# Patient Record
Sex: Male | Born: 1960 | Race: Black or African American | Hispanic: No | Marital: Married | Smoking: Former smoker
Health system: Southern US, Community
[De-identification: ages and names within clinical notes are randomized; demographics above are authoritative.]

## PROBLEM LIST (undated history)

## (undated) DIAGNOSIS — Z9289 Personal history of other medical treatment: Secondary | ICD-10-CM

## (undated) DIAGNOSIS — C801 Malignant (primary) neoplasm, unspecified: Secondary | ICD-10-CM

## (undated) DIAGNOSIS — D573 Sickle-cell trait: Secondary | ICD-10-CM

## (undated) DIAGNOSIS — IMO0001 Reserved for inherently not codable concepts without codable children: Secondary | ICD-10-CM

## (undated) DIAGNOSIS — R931 Abnormal findings on diagnostic imaging of heart and coronary circulation: Secondary | ICD-10-CM

## (undated) DIAGNOSIS — Z8679 Personal history of other diseases of the circulatory system: Secondary | ICD-10-CM

## (undated) DIAGNOSIS — N319 Neuromuscular dysfunction of bladder, unspecified: Secondary | ICD-10-CM

## (undated) DIAGNOSIS — I10 Essential (primary) hypertension: Secondary | ICD-10-CM

## (undated) DIAGNOSIS — Z9889 Other specified postprocedural states: Secondary | ICD-10-CM

## (undated) DIAGNOSIS — M869 Osteomyelitis, unspecified: Secondary | ICD-10-CM

## (undated) DIAGNOSIS — R935 Abnormal findings on diagnostic imaging of other abdominal regions, including retroperitoneum: Secondary | ICD-10-CM

## (undated) DIAGNOSIS — D509 Iron deficiency anemia, unspecified: Secondary | ICD-10-CM

## (undated) HISTORY — DX: Reserved for inherently not codable concepts without codable children: IMO0001

## (undated) HISTORY — PX: TONSILLECTOMY: SUR1361

## (undated) HISTORY — PX: PATELLA FRACTURE SURGERY: SHX735

## (undated) HISTORY — DX: Personal history of other diseases of the circulatory system: Z86.79

## (undated) HISTORY — DX: Other specified postprocedural states: Z98.890

## (undated) HISTORY — DX: Abnormal findings on diagnostic imaging of heart and coronary circulation: R93.1

## (undated) HISTORY — DX: Abnormal findings on diagnostic imaging of other abdominal regions, including retroperitoneum: R93.5

## (undated) HISTORY — DX: Personal history of other medical treatment: Z92.89

## (undated) HISTORY — DX: Sickle-cell trait: D57.3

---

## 1998-02-18 ENCOUNTER — Emergency Department (HOSPITAL_COMMUNITY): Admission: EM | Admit: 1998-02-18 | Discharge: 1998-02-18 | Payer: Self-pay | Admitting: Emergency Medicine

## 1998-02-20 ENCOUNTER — Emergency Department (HOSPITAL_COMMUNITY): Admission: EM | Admit: 1998-02-20 | Discharge: 1998-02-20 | Payer: Self-pay | Admitting: Emergency Medicine

## 1999-01-12 ENCOUNTER — Emergency Department (HOSPITAL_COMMUNITY): Admission: EM | Admit: 1999-01-12 | Discharge: 1999-01-12 | Payer: Self-pay

## 1999-03-16 ENCOUNTER — Emergency Department (HOSPITAL_COMMUNITY): Admission: EM | Admit: 1999-03-16 | Discharge: 1999-03-16 | Payer: Self-pay | Admitting: Emergency Medicine

## 2001-10-09 ENCOUNTER — Encounter: Payer: Self-pay | Admitting: Emergency Medicine

## 2001-10-09 ENCOUNTER — Inpatient Hospital Stay (HOSPITAL_COMMUNITY): Admission: EM | Admit: 2001-10-09 | Discharge: 2001-10-13 | Payer: Self-pay | Admitting: Emergency Medicine

## 2001-10-11 ENCOUNTER — Encounter: Payer: Self-pay | Admitting: Internal Medicine

## 2002-06-24 ENCOUNTER — Encounter: Payer: Self-pay | Admitting: Emergency Medicine

## 2002-06-24 ENCOUNTER — Emergency Department (HOSPITAL_COMMUNITY): Admission: EM | Admit: 2002-06-24 | Discharge: 2002-06-24 | Payer: Self-pay | Admitting: Emergency Medicine

## 2002-06-28 ENCOUNTER — Encounter: Payer: Self-pay | Admitting: Specialist

## 2002-06-28 ENCOUNTER — Encounter: Admission: RE | Admit: 2002-06-28 | Discharge: 2002-06-28 | Payer: Self-pay | Admitting: *Deleted

## 2002-06-30 ENCOUNTER — Encounter: Payer: Self-pay | Admitting: Specialist

## 2002-07-01 ENCOUNTER — Inpatient Hospital Stay (HOSPITAL_COMMUNITY): Admission: RE | Admit: 2002-07-01 | Discharge: 2002-07-04 | Payer: Self-pay | Admitting: Specialist

## 2002-07-01 ENCOUNTER — Encounter: Payer: Self-pay | Admitting: Specialist

## 2003-12-06 ENCOUNTER — Emergency Department (HOSPITAL_COMMUNITY): Admission: EM | Admit: 2003-12-06 | Discharge: 2003-12-06 | Payer: Self-pay | Admitting: Emergency Medicine

## 2004-04-03 ENCOUNTER — Emergency Department (HOSPITAL_COMMUNITY): Admission: EM | Admit: 2004-04-03 | Discharge: 2004-04-03 | Payer: Self-pay | Admitting: Emergency Medicine

## 2005-07-04 ENCOUNTER — Encounter
Admission: RE | Admit: 2005-07-04 | Discharge: 2005-10-02 | Payer: Self-pay | Admitting: Physical Medicine & Rehabilitation

## 2005-07-04 ENCOUNTER — Ambulatory Visit: Payer: Self-pay | Admitting: Physical Medicine & Rehabilitation

## 2005-08-25 ENCOUNTER — Emergency Department (HOSPITAL_COMMUNITY): Admission: EM | Admit: 2005-08-25 | Discharge: 2005-08-25 | Payer: Self-pay | Admitting: Emergency Medicine

## 2006-02-02 ENCOUNTER — Emergency Department (HOSPITAL_COMMUNITY): Admission: EM | Admit: 2006-02-02 | Discharge: 2006-02-02 | Payer: Self-pay | Admitting: Emergency Medicine

## 2006-04-05 ENCOUNTER — Emergency Department (HOSPITAL_COMMUNITY): Admission: EM | Admit: 2006-04-05 | Discharge: 2006-04-05 | Payer: Self-pay | Admitting: Emergency Medicine

## 2007-01-30 ENCOUNTER — Emergency Department (HOSPITAL_COMMUNITY): Admission: EM | Admit: 2007-01-30 | Discharge: 2007-01-30 | Payer: Self-pay | Admitting: Emergency Medicine

## 2007-04-08 ENCOUNTER — Emergency Department (HOSPITAL_COMMUNITY): Admission: EM | Admit: 2007-04-08 | Discharge: 2007-04-09 | Payer: Self-pay | Admitting: Emergency Medicine

## 2007-10-06 ENCOUNTER — Ambulatory Visit: Payer: Self-pay | Admitting: Family Medicine

## 2007-10-06 LAB — CONVERTED CEMR LAB
BUN: 10 mg/dL (ref 6–23)
Calcium: 9.6 mg/dL (ref 8.4–10.5)
Glucose, Bld: 145 mg/dL — ABNORMAL HIGH (ref 70–99)
Microalb, Ur: 1.01 mg/dL (ref 0.00–1.89)
PSA: 0.22 ng/mL (ref 0.10–4.00)
Potassium: 4.4 meq/L (ref 3.5–5.3)
Triglycerides: 351 mg/dL — ABNORMAL HIGH (ref <150)
VLDL: 70 mg/dL — ABNORMAL HIGH (ref 0–40)

## 2007-10-16 ENCOUNTER — Ambulatory Visit: Payer: Self-pay | Admitting: Family Medicine

## 2007-10-23 ENCOUNTER — Ambulatory Visit: Payer: Self-pay | Admitting: Internal Medicine

## 2008-01-19 ENCOUNTER — Ambulatory Visit: Payer: Self-pay | Admitting: Family Medicine

## 2008-06-21 ENCOUNTER — Emergency Department (HOSPITAL_COMMUNITY): Admission: EM | Admit: 2008-06-21 | Discharge: 2008-06-21 | Payer: Self-pay | Admitting: Emergency Medicine

## 2008-06-24 ENCOUNTER — Ambulatory Visit: Payer: Self-pay | Admitting: Family Medicine

## 2008-06-24 ENCOUNTER — Ambulatory Visit (HOSPITAL_COMMUNITY): Admission: RE | Admit: 2008-06-24 | Discharge: 2008-06-24 | Payer: Self-pay | Admitting: Family Medicine

## 2008-06-24 DIAGNOSIS — I10 Essential (primary) hypertension: Secondary | ICD-10-CM

## 2008-06-24 DIAGNOSIS — R002 Palpitations: Secondary | ICD-10-CM

## 2008-06-24 DIAGNOSIS — E1165 Type 2 diabetes mellitus with hyperglycemia: Secondary | ICD-10-CM

## 2008-06-24 LAB — CONVERTED CEMR LAB: Hgb A1c MFr Bld: 9 %

## 2008-06-26 DIAGNOSIS — E119 Type 2 diabetes mellitus without complications: Secondary | ICD-10-CM | POA: Insufficient documentation

## 2008-06-28 ENCOUNTER — Encounter: Payer: Self-pay | Admitting: Family Medicine

## 2008-07-22 ENCOUNTER — Ambulatory Visit: Payer: Self-pay | Admitting: Family Medicine

## 2008-07-22 ENCOUNTER — Encounter: Payer: Self-pay | Admitting: Family Medicine

## 2008-07-22 LAB — CONVERTED CEMR LAB
Alkaline Phosphatase: 59 units/L (ref 39–117)
BUN: 7 mg/dL (ref 6–23)
CO2: 19 meq/L (ref 19–32)
Calcium: 9.2 mg/dL (ref 8.4–10.5)
Creatinine, Ser: 0.81 mg/dL (ref 0.40–1.50)
HDL: 26 mg/dL — ABNORMAL LOW (ref >39)
LDL Cholesterol: 31 mg/dL (ref 0–99)
Total Protein: 7.1 g/dL (ref 6.0–8.3)
VLDL: 42 mg/dL — ABNORMAL HIGH (ref 0–40)

## 2008-07-26 ENCOUNTER — Encounter: Payer: Self-pay | Admitting: Family Medicine

## 2008-09-23 ENCOUNTER — Ambulatory Visit: Payer: Self-pay | Admitting: Family Medicine

## 2008-09-23 DIAGNOSIS — R609 Edema, unspecified: Secondary | ICD-10-CM | POA: Insufficient documentation

## 2008-09-23 DIAGNOSIS — M79609 Pain in unspecified limb: Secondary | ICD-10-CM | POA: Insufficient documentation

## 2008-09-23 LAB — CONVERTED CEMR LAB: Hgb A1c MFr Bld: 7.8 %

## 2008-09-30 ENCOUNTER — Encounter: Payer: Self-pay | Admitting: *Deleted

## 2008-09-30 ENCOUNTER — Encounter: Payer: Self-pay | Admitting: Family Medicine

## 2008-09-30 ENCOUNTER — Ambulatory Visit (HOSPITAL_COMMUNITY): Admission: RE | Admit: 2008-09-30 | Discharge: 2008-09-30 | Payer: Self-pay | Admitting: Family Medicine

## 2008-09-30 ENCOUNTER — Ambulatory Visit: Payer: Self-pay | Admitting: Vascular Surgery

## 2008-12-09 ENCOUNTER — Ambulatory Visit: Payer: Self-pay | Admitting: Family Medicine

## 2008-12-09 ENCOUNTER — Encounter: Payer: Self-pay | Admitting: Family Medicine

## 2008-12-09 DIAGNOSIS — Z87440 Personal history of urinary (tract) infections: Secondary | ICD-10-CM

## 2008-12-09 DIAGNOSIS — H5710 Ocular pain, unspecified eye: Secondary | ICD-10-CM

## 2008-12-09 LAB — CONVERTED CEMR LAB
Blood in Urine, dipstick: NEGATIVE
Glucose, Urine, Semiquant: 250
Ketones, urine, test strip: NEGATIVE
Platelets: 254 10*3/uL (ref 150–400)
Protein, U semiquant: 30
RBC: 4.93 M/uL (ref 4.22–5.81)
Specific Gravity, Urine: 1.025
Urobilinogen, UA: 1
pH: 7

## 2008-12-12 ENCOUNTER — Encounter: Payer: Self-pay | Admitting: Family Medicine

## 2009-05-01 ENCOUNTER — Encounter (INDEPENDENT_AMBULATORY_CARE_PROVIDER_SITE_OTHER): Payer: Self-pay | Admitting: *Deleted

## 2009-05-01 DIAGNOSIS — F17200 Nicotine dependence, unspecified, uncomplicated: Secondary | ICD-10-CM

## 2009-06-27 ENCOUNTER — Telehealth: Payer: Self-pay | Admitting: Family Medicine

## 2009-10-25 ENCOUNTER — Telehealth: Payer: Self-pay | Admitting: Family Medicine

## 2009-11-21 ENCOUNTER — Ambulatory Visit: Payer: Self-pay | Admitting: Family Medicine

## 2009-11-21 LAB — CONVERTED CEMR LAB: Hgb A1c MFr Bld: 8.3 %

## 2009-11-24 ENCOUNTER — Ambulatory Visit: Payer: Self-pay | Admitting: Family Medicine

## 2009-11-24 ENCOUNTER — Encounter: Payer: Self-pay | Admitting: Family Medicine

## 2009-11-24 LAB — CONVERTED CEMR LAB
HDL: 29 mg/dL — ABNORMAL LOW (ref >39)
Total CHOL/HDL Ratio: 4.2

## 2009-11-27 ENCOUNTER — Encounter: Payer: Self-pay | Admitting: Family Medicine

## 2009-12-29 ENCOUNTER — Telehealth: Payer: Self-pay | Admitting: Family Medicine

## 2010-03-30 ENCOUNTER — Telehealth (INDEPENDENT_AMBULATORY_CARE_PROVIDER_SITE_OTHER): Payer: Self-pay | Admitting: *Deleted

## 2010-03-30 ENCOUNTER — Emergency Department (HOSPITAL_COMMUNITY)
Admission: EM | Admit: 2010-03-30 | Discharge: 2010-03-30 | Payer: Self-pay | Source: Home / Self Care | Admitting: Family Medicine

## 2010-04-16 ENCOUNTER — Ambulatory Visit: Payer: Self-pay | Admitting: Family Medicine

## 2010-04-16 ENCOUNTER — Encounter: Payer: Self-pay | Admitting: Family Medicine

## 2010-04-16 DIAGNOSIS — M545 Low back pain, unspecified: Secondary | ICD-10-CM | POA: Insufficient documentation

## 2010-04-16 LAB — CONVERTED CEMR LAB
ALT: 16 units/L (ref 0–53)
AST: 17 units/L (ref 0–37)
BUN: 8 mg/dL (ref 6–23)
Bilirubin Urine: NEGATIVE
CO2: 24 meq/L (ref 19–32)
Chloride: 107 meq/L (ref 96–112)
Cholesterol: 102 mg/dL (ref 0–200)
Creatinine, Ser: 0.84 mg/dL (ref 0.40–1.50)
Glucose, Bld: 76 mg/dL (ref 70–99)
HDL: 32 mg/dL — ABNORMAL LOW (ref >39)
Platelets: 285 10*3/uL (ref 150–400)
Protein, U semiquant: 100
RDW: 12.7 % (ref 11.5–15.5)
Sodium: 137 meq/L (ref 135–145)
Specific Gravity, Urine: 1.02
VLDL: 21 mg/dL (ref 0–40)
WBC Urine, dipstick: NEGATIVE
pH: 7.5

## 2010-04-17 ENCOUNTER — Telehealth: Payer: Self-pay | Admitting: *Deleted

## 2010-05-22 ENCOUNTER — Telehealth: Payer: Self-pay | Admitting: Family Medicine

## 2010-05-25 ENCOUNTER — Ambulatory Visit: Admission: RE | Admit: 2010-05-25 | Discharge: 2010-05-25 | Payer: Self-pay | Source: Home / Self Care

## 2010-05-25 DIAGNOSIS — N401 Enlarged prostate with lower urinary tract symptoms: Secondary | ICD-10-CM | POA: Insufficient documentation

## 2010-05-25 DIAGNOSIS — K089 Disorder of teeth and supporting structures, unspecified: Secondary | ICD-10-CM | POA: Insufficient documentation

## 2010-05-30 ENCOUNTER — Encounter: Payer: Self-pay | Admitting: *Deleted

## 2010-05-31 NOTE — Progress Notes (Signed)
----   Converted from flag ---- ---- 04/17/2010 3:00 PM, Tessie Fass CMA wrote:   ---- 04/16/2010 11:57 AM, Luretha Murphy NP wrote: URO referral needed at [facility] for hematuria recurrent, urinary retention, UTI in a male, smoker and diabetic. ------------------------------  called [facility] to return call....Marland KitchenMarland KitchenTessie Fass CMA  April 17, 2010 3:02 PM spoke with [facility]....faxed referral for appt.Tessie Fass CMA  April 18, 2010 10:48 AM

## 2010-05-31 NOTE — Progress Notes (Signed)
Summary: refill  Phone Note Refill Request Call back at [phone] Message from:  Patient  Refills Requested: Medication #1:  GLIPIZIDE 10 MG TABS bid [facility]  Initial call taken by: De Nurse,  June 27, 2009 4:21 PM    New/Updated Medications: GLIPIZIDE 10 MG TABS (GLIPIZIDE) bid Prescriptions: GLIPIZIDE 10 MG TABS (GLIPIZIDE) bid  #60 x 6   Entered and Authorized by:   Luretha Murphy NP   Signed by:   Luretha Murphy NP on 06/27/2009   Method used:   Electronically to        [facility] (retail)       [address]       [place], [place]  [address]       Ph: [phone]       Fax: [phone]   RxID:   [id number]

## 2010-05-31 NOTE — Progress Notes (Signed)
Summary: Rx  Phone Note Refill Request Call back at Home Phone [phone]   Refills Requested: Medication #1:  VICODIN 5-500 MG TABS 2 tabs q 4 hours as needed Initial call taken by: Knox Royalty,  May 22, 2010 12:19 PM  Follow-up for Phone Call        This was printed and faxed this AM Follow-up by: Luretha Murphy NP,  May 22, 2010 1:50 PM

## 2010-05-31 NOTE — Progress Notes (Signed)
Summary: Rx Req  Phone Note Refill Request Call back at [phone] Message from:  Patient  Refills Requested: Medication #1:  CARDIZEM 60 MG TABS two times a day  Medication #2:  METFORMIN HCL 1000 MG TABS two times a day [facility].  Initial call taken by: Clydell Hakim,  October 25, 2009 3:34 PM  Follow-up for Phone Call        As stated in April, no further refills until he is seen.  Follow-up by: Zachery Dauer MD,  October 25, 2009 4:14 PM    Prescriptions: CARDIZEM 60 MG TABS (DILTIAZEM HCL) two times a day  #60 x 0   Entered and Authorized by:   Zachery Dauer MD   Signed by:   Zachery Dauer MD on 10/25/2009   Method used:   Electronically to        [facility] (retail)       [address]       [place], [place]  [address]       Ph: [phone]       Fax: [phone]   RxID:   [id number] GLIPIZIDE 10 MG TABS (GLIPIZIDE) bid  #60 x 1   Entered and Authorized by:   Zachery Dauer MD   Signed by:   Zachery Dauer MD on 10/25/2009   Method used:   Electronically to        [facility] (retail)       [address]       [place], [place]  [address]       Ph: [phone]       Fax: [phone]   RxID:   [id number]   Appended Document: Rx Req Spoke to wife and given info.  Starleen Blue RN 10/25/2009

## 2010-05-31 NOTE — Progress Notes (Signed)
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       [facility]       [address]       [place], [place]  [address]       Ph: [phone]       Fax: [phone]   RxID:   [id number]

## 2010-05-31 NOTE — Assessment & Plan Note (Signed)
Summary: back pain/eo   Vital Signs:  Patient profile:   50 year old male Height:      79 inches Weight:      353 pounds BMI:     39.91 Temp:     98.4 degrees F oral Pulse rate:   82 / minute BP sitting:   144 / 88  (left arm) Cuff size:   large  Vitals Entered By: Tessie Fass CMA (November 21, 2009 8:42 AM) CC: lower back pain x 2 weeks Is Patient Diabetic? Yes Pain Assessment Patient in pain? yes     Location: lower back Intensity: 10   CC:  lower back pain x 2 weeks.  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  [place]  If he does not work he does not get paid.  He thinks it started after lifting some items off the truck.  Pain is severe in lower back, having spasms, pain radiates down both legs.  Hurts to lay, sit, and walk.  Past history of back injury.  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  Has been out of his glipizide and lisinopril.  Has not been in for prevention in over 6 months.  Habits & Providers  Alcohol-Tobacco-Diet     Tobacco Status: current     Tobacco Counseling: to quit use of tobacco products     Cigarette Packs/Day: 1.0  Current Medications (verified): 1)  Glipizide 10 Mg Tabs (Glipizide) .... Bid 2)  Metformin Hcl 1000 Mg Tabs (Metformin Hcl) .... Two Times A Day 3)  Cardizem 60 Mg Tabs (Diltiazem Hcl) .... Two Times A Day 4)  Lisinopril 10 Mg Tabs (Lisinopril) .... One Daily 5)  Adult Aspirin Low Strength 81 Mg Tbdp (Aspirin) 6)  Diclofenac Sodium 75 Mg Tbec (Diclofenac Sodium) .... Two Times A Day For Knee Pain and Shoulder Pain 7)  Percocet 5-325 Mg Tabs (Oxycodone-Acetaminophen) .Marland Kitchen.. 1-2 Tabs Q 4 Hours As Needed 8)  Cyclobenzaprine Hcl 10 Mg Tabs (Cyclobenzaprine Hcl) .... One Three Times A Day As Needed For Muscle Spasms  Allergies (verified): No Known Drug Allergies  Review  of Systems General:  Denies fever, loss of appetite, and weakness. MS:  Complains of low back pain and stiffness.  Physical Exam  General:  Very large man in quite a bit of pain, guarding all movement Lungs:  normal respiratory effort and normal breath sounds.   Heart:  normal rate and regular rhythm.   Msk:  Unable to put lower back into motion, very still with flat lordosis.  + straight leg raise bilaterally Extremities:  3+ edema to knees   Impression & Recommendations:  Problem # 1:  LOW BACK PAIN, ACUTE (ICD-724.2)  Treat acute pain, begin stretching when pain is controlled, ice.  No working until recheck this Friday. His updated medication list for this problem includes:    Adult Aspirin Low Strength 81 Mg Tbdp (Aspirin)    Diclofenac Sodium 75 Mg Tbec (Diclofenac sodium) .Marland Kitchen..Marland Kitchen Two times a day for knee pain and shoulder pain    Percocet 5-325 Mg Tabs (Oxycodone-acetaminophen) .Marland Kitchen... 1-2 tabs q 4 hours as needed    Cyclobenzaprine Hcl 10 Mg Tabs (Cyclobenzaprine hcl) ..... One three times a day as needed for muscle spasms  Orders: [facility]- Est  Level 4 ([address])  Problem # 2:  DIABETES MELLITUS, TYPE II (ICD-250.00) Refilled meds A1C at 8.3%, deteriorated His updated medication list for this problem includes:    Glipizide 10 Mg Tabs (Glipizide) ..... Bid    Metformin Hcl 1000 Mg Tabs (Metformin hcl) .Marland Kitchen..Marland Kitchen Two times a day    Lisinopril 10 Mg Tabs (Lisinopril) ..... One daily    Adult Aspirin Low Strength 81 Mg Tbdp (Aspirin)  Orders: A1C-[facility] (308[address]) [facility]- Est  Level 4 (784[address])  Problem # 3:  HYPERTENSION (ICD-401.9)  not at goal today but has been out of [facility] His updated medication list for this problem includes:    Cardizem 60 Mg Tabs (Diltiazem hcl) .Marland Kitchen..Marland Kitchen Two times a day    Lisinopril 10 Mg Tabs (Lisinopril) ..... One daily  Orders: [facility]- Est  Level 4 ([address])  Complete Medication List: 1)  Glipizide 10 Mg Tabs (Glipizide) .... Bid 2)  Metformin Hcl 1000 Mg Tabs  (Metformin hcl) .... Two times a day 3)  Cardizem 60 Mg Tabs (Diltiazem hcl) .... Two times a day 4)  Lisinopril 10 Mg Tabs (Lisinopril) .... One daily 5)  Adult Aspirin Low Strength 81 Mg Tbdp (Aspirin) 6)  Diclofenac Sodium 75 Mg Tbec (Diclofenac sodium) .... Two times a day for knee pain and shoulder pain 7)  Percocet 5-325 Mg Tabs (Oxycodone-acetaminophen) .Marland Kitchen.. 1-2 tabs q 4 hours as needed 8)  Cyclobenzaprine Hcl 10 Mg Tabs (Cyclobenzaprine hcl) .... One three times a day as needed for muscle spasms  Patient Instructions: 1)  return Friday to see Janila Arrazola Prescriptions: LISINOPRIL 10 MG TABS (LISINOPRIL) one daily  #30 x 6   Entered and Authorized by:   Luretha Murphy NP   Signed by:   Luretha Murphy NP on 11/21/2009   Method used:   Print then Give to Patient   RxID:   [id number] CARDIZEM 60 MG TABS (DILTIAZEM HCL) two times a day  #60 x 6   Entered and Authorized by:   Luretha Murphy NP   Signed by:   Luretha Murphy NP on 11/21/2009   Method used:   Print then Give to Patient   RxID:   [id number] LISINOPRIL 10 MG TABS (LISINOPRIL) one daily  #30 x 6   Entered and Authorized by:   Luretha Murphy NP   Signed by:   Luretha Murphy NP on 11/21/2009   Method used:   Print then Give to Patient   RxID:   [id number] CARDIZEM 60 MG TABS (DILTIAZEM HCL) two times a day  #60 x 6   Entered and Authorized by:   Luretha Murphy NP   Signed by:   Luretha Murphy NP on 11/21/2009   Method used:   Print then Give to Patient   RxID:   [id number] GLIPIZIDE 10 MG TABS (GLIPIZIDE) bid  #60 x 6   Entered and Authorized by:   Luretha Murphy NP   Signed by:   Luretha Murphy NP on 11/21/2009   Method used:   Print then Give to Patient   RxID:   [id number] GLIPIZIDE 10 MG TABS (GLIPIZIDE) bid  #60 x 6   Entered and Authorized by:   Luretha Murphy NP   Signed by:   Luretha Murphy NP on 11/21/2009   Method used:   Print then Give to Patient   RxID:   [id number] CYCLOBENZAPRINE HCL 10 MG TABS  (CYCLOBENZAPRINE HCL) one three times a day as needed for muscle spasms Brand medically necessary #90 x 3   Entered and Authorized by:   Luretha Murphy  NP   Signed by:   Luretha Murphy NP on 11/21/2009   Method used:   Print then Give to Patient   RxID:   [id number] DICLOFENAC SODIUM 75 MG TBEC (DICLOFENAC SODIUM) two times a day for knee pain and shoulder pain Brand medically necessary #60 x 6   Entered and Authorized by:   Luretha Murphy NP   Signed by:   Luretha Murphy NP on 11/21/2009   Method used:   Print then Give to Patient   RxID:   [id number] PERCOCET 5-325 MG TABS (OXYCODONE-ACETAMINOPHEN) 1-2 tabs q 4 hours as needed Brand medically necessary #50 x 0   Entered and Authorized by:   Luretha Murphy NP   Signed by:   Luretha Murphy NP on 11/21/2009   Method used:   Print then Give to Patient   RxID:   [id number]   Laboratory Results   Blood Tests   Date/Time Received: November 21, 2009 8:50 AM  Date/Time Reported: November 21, 2009 9:04 AM   HGBA1C: 8.3%   (Normal Range: Non-Diabetic - 3-6%   Control Diabetic - 6-8%)  Comments: ...............test performed by......Marland KitchenBonnie A. [place], MLS (ASCP)cm        Prevention & Chronic Care Immunizations   Influenza vaccine: Not documented    Tetanus booster: 06/30/2006: given   Tetanus booster due: 06/29/2016    Pneumococcal vaccine: Not documented  Other Screening   PSA: 0.22  (10/06/2007)   PSA due due: 10/05/2008   Smoking status: current  (11/21/2009)   Smoking cessation counseling: yes  (12/09/2008)  Diabetes Mellitus   HgbA1C: 8.3  (11/21/2009)   Hemoglobin A1C due: 09/21/2008    Eye exam: Not documented   Diabetic eye exam action/deferral: Ophthalmology referral  (12/09/2008)    Foot exam: Not documented   Foot exam action/deferral: Do today   High risk foot: Not documented   Foot care education: Not documented    Urine microalbumin/creatinine ratio: Not documented   Urine microalbumin action/deferral:  Ordered   Urine microalbumin/cr due: 10/05/2008    Diabetes flowsheet reviewed?: Yes   Progress toward A1C goal: Deteriorated  Lipids   Total Cholesterol: 99  (07/22/2008)   LDL: 31  (07/22/2008)   LDL Direct: Not documented   HDL: 26  (07/22/2008)   Triglycerides: 208  (07/22/2008)  Hypertension   Last Blood Pressure: 144 / 88  (11/21/2009)   Serum creatinine: 0.81  (07/22/2008)   Serum potassium 4.2  (07/22/2008)  Self-Management Support :    Diabetes self-management support: Not documented    Hypertension self-management support: Not documented   Nursing Instructions: Diabetic foot exam today

## 2010-05-31 NOTE — Assessment & Plan Note (Signed)
Summary: F/U/KH   Vital Signs:  Patient profile:   50 year old male Weight:      357.4 pounds BP sitting:   144 / 98  (left arm)  Vitals Entered By: Starleen Blue RN (November 24, 2009 9:04 AM) CC: f/u back, Back Pain Is Patient Diabetic? Yes Pain Assessment Patient in pain? yes     Location: back Intensity: 8   CC:  f/u back and Back Pain.  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  He is not much better today.    Back Pain History:      The patient's back pain started approximately 11/20/2009.  The pain is located in the lower back region and does radiate below the knees.  He states this is work related.  On a scale of 1-10, he describes the pain as a 10.  He states that he has had a prior history of back pain.  The patient has not had any recent physical therapy for his back pain.  The following makes the back pain better: not much, lying on side .  The following makes the back pain worse: sitting, standing.        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     Habits & Providers  Alcohol-Tobacco-Diet     Tobacco Status: current     Cigarette Packs/Day: 1.0  Current Medications (verified): 1)  Glipizide 10 Mg Tabs (Glipizide) .... Bid 2)  Metformin Hcl 1000 Mg Tabs (Metformin Hcl) .... Two Times A Day 3)  Cardizem 60 Mg Tabs (Diltiazem Hcl) .... Two Times A Day 4)  Lisinopril 10 Mg Tabs (Lisinopril) .... One Daily 5)  Adult Aspirin Low Strength 81 Mg Tbdp (Aspirin) 6)  Diclofenac Sodium 75 Mg Tbec (Diclofenac Sodium) .... Two Times A Day For Knee Pain and Shoulder Pain 7)  Percocet 5-325 Mg Tabs (Oxycodone-Acetaminophen) .Marland Kitchen.. 1-2 Tabs Q 4 Hours As Needed 8)  Cyclobenzaprine Hcl 10 Mg Tabs (Cyclobenzaprine Hcl) .... One Three Times A  Day As Needed For Muscle Spasms  Allergies (verified): No Known Drug Allergies  Physical Exam  General:  Moving very slowly, appears to be in severe pain, see Back Exam  Low Back Pain Physical Exam:    Inspection-deformity:     Yes    Palpation-spinal tenderness:   Yes    Motor Exam/Strength:         Left Ankle Dorsiflexion (L5,L4):     normal       Left Great Toe Dorsiflexion (L5,L4):     normal       Left Heel Walk (L5,some L4):     normal       Left Toe Walk-calf (S1):       abnormal       Right Ankle Dorsiflexion (L5,L4):     normal       Right Great Toe Dorsiflexion (L5,L4):       normal       Right Heel Walk (L5,some L4):     normal       Right Toe Walk-calf (S1):       abnormal    Sensory Exam/Pinprick:        Left Medial Foot (L4):   decreased  Left Dorsal Foot (L5):   decreased       Left Lateral Foot (S1):   decreased       Right Medial Foot (L4):   decreased       Right Dorsal Foot (L5):   decreased       Right Lateral Foot (S1):   decreased       Sensory--Other:     sensory loss may be related to DM that was untreated for years    Reflexes:        Left Knee Jerk (L4):     absent       Left Ankle Reflex (S1):   absent       Right Knee Jerk:     absent       Right Ankle Reflex (S1):   absent    Straight Leg Raise (SLR):       Left Straight Leg Raise (SLR):   positive at 20 degrees       Right Straight Leg Raise (SLR):   positive at 10 degrees   Impression & Recommendations:  Problem # 1:  LOW BACK PAIN, ACUTE (ICD-724.2)  Continue plan, expect recovery at some point.  He is to watchful wait, not drive until he feels that he can tolerate the pain without medicaions.  He believes that this happened at work when lifting from his 77 wheeler during a unloading.  He will look at this workman's comp options.  He really cannot afford to not drive.  Did not refill meds, he still has plenty. His updated medication list for this problem includes:    Adult Aspirin Low  Strength 81 Mg Tbdp (Aspirin)    Diclofenac Sodium 75 Mg Tbec (Diclofenac sodium) .Marland Kitchen..Marland Kitchen Two times a day for knee pain and shoulder pain    Percocet 5-325 Mg Tabs (Oxycodone-acetaminophen) .Marland Kitchen... 1-2 tabs q 4 hours as needed    Cyclobenzaprine Hcl 10 Mg Tabs (Cyclobenzaprine hcl) ..... One three times a day as needed for muscle spasms  Orders: [facility]- Est Level  3 ([address])  Complete Medication List: 1)  Glipizide 10 Mg Tabs (Glipizide) .... Bid 2)  Metformin Hcl 1000 Mg Tabs (Metformin hcl) .... Two times a day 3)  Cardizem 60 Mg Tabs (Diltiazem hcl) .... Two times a day 4)  Lisinopril 20 Mg Tabs (Lisinopril) .... One daily (dosage change with next script, cancel 10 mg dosage) 5)  Adult Aspirin Low Strength 81 Mg Tbdp (Aspirin) 6)  Diclofenac Sodium 75 Mg Tbec (Diclofenac sodium) .... Two times a day for knee pain and shoulder pain 7)  Percocet 5-325 Mg Tabs (Oxycodone-acetaminophen) .Marland Kitchen.. 1-2 tabs q 4 hours as needed 8)  Cyclobenzaprine Hcl 10 Mg Tabs (Cyclobenzaprine hcl) .... One three times a day as needed for muscle spasms  Other Orders: Lipid-[facility] ([id number])  Patient Instructions: 1)  Continue plan of controlling pain, stretching and positioning as directed 2)  Investigate WC and I will complete forms if needed Prescriptions: LISINOPRIL 20 MG TABS (LISINOPRIL) one daily (dosage change with next script, cancel 10 mg dosage)  #30 x 6   Entered and Authorized by:   Luretha Murphy NP   Signed by:   Luretha Murphy NP on 11/24/2009   Method used:   Electronically to        [facility] (retail)       [address]       [place], [place]  [address]  Ph: [phone]       Fax: [phone]   RxID:   [id number]

## 2010-05-31 NOTE — Progress Notes (Signed)
Summary: sent to UC  Phone Note Call from Patient   Caller: Patient Summary of Call: pt having urinary problems and was sent to UC Initial call taken by: De Nurse,  March 30, 2010 11:24 AM

## 2010-05-31 NOTE — Letter (Signed)
Summary: Generic Letter  [facility]  [address]   [place], [place] [address]   Phone: [phone]  Fax: [phone]    11/27/2009  Ray Hall [address] [place], [place]  [address]  Dear [name],   When we checked you cholesterol your triglycerides were very high.  That tells me that you diet is not as healthy as it should be.  I would like you to purchase fish oil 1000 mg and begin to take it twice daily.  This will help you triglycerides, and be a positive factor in prevention of heart disease.  I hope your back is improving, weight loss will help over time. I hate to sound like a broken record but we do know that the basics work.      Sincerely,   Luretha Murphy NP  Appended Document: Generic Letter mailed

## 2010-05-31 NOTE — Miscellaneous (Signed)
Summary: [name]  Clinical Lists Changes  Problems: Added new problem of [name] (ICD-305.1) 

## 2010-05-31 NOTE — Assessment & Plan Note (Signed)
Summary: F/U  KH   Vital Signs:  Patient profile:   50 year old male Height:      79 inches Weight:      336 pounds BMI:     37.99 Temp:     98.5 degrees F Pulse rate:   34 / minute BP sitting:   108 / 80  (left arm)  Vitals Entered By: Theresia Lo RN (May 25, 2010 1:24 PM) CC: follow up regarding prostate problem , tooth ache Is Patient Diabetic? Yes Pain Assessment Patient in pain? yes     Location: tooth ache Intensity: 9 Type: ache   CC:  follow up regarding prostate problem  and tooth ache.  History of Present Illness: Painful right upper molar, putting BC powder on it.  He almost cries in pain.  He has advanced gum disease, he has many missing teeth.  He often pulls out his own teeth as he cannot afford to have them pulled.  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  He has had significant back injury in the past with multiple degenerative discs.  He played football for 2 years at [employer] and was hurt with a knee injury.  He is very worried about his prostate, he has had several infections, and now is taking more BR breaks. He leaks a little urine on his underwear during the night.  When he voids he voids large amounts.  Dizziness when he stands for about a month.  Habits & Providers  Alcohol-Tobacco-Diet     Tobacco Status: current     Tobacco Counseling: to quit use of tobacco products     Cigarette Packs/Day: 1.0  Current Medications (verified): 1)  Glipizide 10 Mg Tabs (Glipizide) .... Bid 2)  Metformin Hcl 1000 Mg Tabs (Metformin Hcl) .... Two Times A Day 3)  Lisinopril 20 Mg Tabs (Lisinopril) .... One Daily (Dosage Change With Next Script, Cancel 10 Mg Dosage) 4)  Adult Aspirin Low Strength 81 Mg Tbdp (Aspirin) 5)  Diclofenac Sodium 75 Mg Tbec (Diclofenac Sodium) .... Two Times A Day For Knee Pain and Shoulder Pain 6)  Cyclobenzaprine Hcl 10 Mg  Tabs (Cyclobenzaprine Hcl) .... One Three Times A Day As Needed For Muscle Spasms 7)  Vicodin 5-500 Mg Tabs (Hydrocodone-Acetaminophen) .... 2 Tabs Q 4 Hours As Needed 8)  Tamsulosin Hcl 0.4 Mg Caps (Tamsulosin Hcl) .... One Daily  Allergies: No Known Drug Allergies  Social History: Packs/Day:  1.0  Review of Systems General:  Denies chills, fatigue, and fever. GU:  Complains of incontinence and nocturia; denies dysuria, erectile dysfunction, hematuria, and urinary hesitancy. MS:  Complains of low back pain.  Physical Exam  General:  alert, very large man.   Mouth:  right upper molar with significant decay and severe pain with tapping, advanced gingivitis Lungs:  normal respiratory effort and normal breath sounds.   Heart:  normal rate and regular rhythm.   Psych:  normally interactive and good eye contact.     Impression & Recommendations:  Problem # 1:  BENIGN PROSTATIC HYPERTROPHY, WITH OBSTRUCTION (ICD-600.01) still having obstructive symptoms, however improved on alpha blocker, BP also down on this.  He has an apt with URO in April. 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 One three times a day as needed for muscle spasms    Vicodin 5-500 Mg Tabs (Hydrocodone-acetaminophen) .Marland Kitchen... 2 tabs q 4 hours as needed  Orders: [facility]- Est Level  3 (16109)  Problem # 3:  DENTAL PAIN (ICD-525.9) advanced gum disease and multiple caries, really in need to dental care, he will iikely pull his own tooth  as he cannot afford care, may use hydrocodone as needed.  Problem # 4:  HYPERTENSION (ICD-401.9) BP low and having dizziness when he stands since on tamsulosin, stop Dilt. The following medications were removed from the medication list:    Cardizem 60 Mg Tabs (Diltiazem hcl) .Marland Kitchen..Marland Kitchen Two times a day His updated medication list for this problem includes:    Lisinopril 20 Mg Tabs (Lisinopril) ..... One daily (dosage change with next script, cancel 10 mg dosage)  Complete Medication List: 1)  Glipizide 10 Mg Tabs (Glipizide) .... Bid 2)  Metformin Hcl 1000 Mg Tabs (Metformin hcl) .... Two times a day 3)  Lisinopril 20 Mg Tabs (Lisinopril) .... One daily (dosage change with next script, cancel 10 mg dosage) 4)  Adult Aspirin Low Strength 81 Mg Tbdp (Aspirin) 5)  Diclofenac Sodium 75 Mg Tbec (Diclofenac sodium) .... Two times a day for knee pain and shoulder pain 6)  Cyclobenzaprine Hcl 10 Mg Tabs (Cyclobenzaprine hcl) .... One three times a day as needed for muscle spasms 7)  Vicodin 5-500 Mg Tabs (Hydrocodone-acetaminophen) .... 2 tabs q 4 hours as needed 8)  Tamsulosin Hcl 0.4 Mg Caps (Tamsulosin hcl) .... One daily  Patient Instructions: 1)  Stop Diltiazem  2)  Take tamsulosin at night time 3)  Keep Urology apt in April 4)  Use the hydrocodone  only when you need it for tooth pain and back pain 5)  Dentist to pull the tooth 6)  Please schedule a follow-up appointment in 3 months .  Prescriptions: VICODIN 5-500 MG TABS (HYDROCODONE-ACETAMINOPHEN) 2 tabs q 4 hours as needed Brand medically necessary #100 x 0   Entered and Authorized by:   Luretha Murphy NP   Signed by:   Luretha Murphy NP on 05/25/2010   Method used:   Print then Give to Patient   RxID:   [id number]    Orders Added: 1)  [facility]- Est Level  3 [95621]     Prevention & Chronic Care Immunizations   Influenza vaccine: Not documented    Tetanus booster: 06/30/2006: given   Tetanus booster due: 06/29/2016     Pneumococcal vaccine: Not documented  Other Screening   PSA: 0.22  (10/06/2007)   PSA due due: 10/05/2008   Smoking status: current  (05/25/2010)   Smoking cessation counseling: yes  (12/09/2008)  Diabetes Mellitus   HgbA1C: 7.6  (04/16/2010)   Hemoglobin A1C due: 09/21/2008    Eye exam: Not documented   Diabetic eye exam action/deferral: Ophthalmology referral  (12/09/2008)    Foot exam: Not documented   Foot exam action/deferral: Do today   High risk foot: Not documented   Foot care education: Not documented  Urine microalbumin/creatinine ratio: Not documented   Urine microalbumin action/deferral: Ordered   Urine microalbumin/cr due: 10/05/2008  Lipids   Total Cholesterol: 102  (04/16/2010)   LDL: 49  (04/16/2010)   LDL Direct: Not documented   HDL: 32  (04/16/2010)   Triglycerides: 105  (04/16/2010)  Hypertension   Last Blood Pressure: 108 / 80  (05/25/2010)   Serum creatinine: 0.84  (04/16/2010)   Serum potassium 3.9  (04/16/2010)  Self-Management Support :    Diabetes self-management support: Not documented    Hypertension self-management support: Not documented

## 2010-05-31 NOTE — Assessment & Plan Note (Signed)
Summary: swelling to prostate/back pain/weakness to legs/bmc   Vital Signs:  Patient profile:   50 year old male Weight:      336.1 pounds Temp:     98.5 degrees F oral Pulse rate:   81 / minute BP sitting:   139 / 90  (right arm) Cuff size:   large  Vitals Entered By: Renato Battles slade,cma CC: back pain started Friday. blacked out on Thursday on toilet. weakness of legs x 2 days. Is Patient Diabetic? Yes Pain Assessment Patient in pain? yes     Location: back Intensity: 8 Onset of pain  x Friday   CC:  back pain started Friday. blacked out on Thursday on toilet. weakness of legs x 2 days.Marland Kitchen  History of Present Illness: Seen in [facility] 2 weeks ago for UTI/hematuria and urinary retention.  Started on Flomax and treated for 7 days wtih Bactrim DS.  His urine if flowing better.  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  He also has ED.    Back pain is severe, almost feels like he is paralyzed in his legs when he sits for long periods in the truck.  He will have to physically move his legs with his arms.  He has had multiple injuries in the past in football and otherwise.   His back flairs are become more frequent.  He describes tingling into his thighs, pain radiating into his legs, right >left.  He has no health insurance, he works full time as a Freight forwarder.  Says that he blacked out while having a BM when on the road last week.  He came to right away and got back into the truck and drove to [place].  He denies chest pain or any other problems like this before.  Habits & Providers  Alcohol-Tobacco-Diet     Tobacco Status: current     Tobacco Counseling: to quit use of tobacco products     Cigarette Packs/Day: 0.5  Allergies: No Known Drug Allergies  Social History: Packs/Day:  0.5  Review of Systems General:  Denies chills and fever. CV:  Denies chest pain or discomfort and fainting. GU:  Complains of erectile dysfunction, hematuria,  and urinary hesitancy. MS:  Complains of low back pain.  Physical Exam  General:  Looked very derpessed, did not make good eye contact Lungs:  normal respiratory effort and normal breath sounds.   Heart:  normal rate, regular rhythm, and no murmur.   Msk:  marked slowness of movement of spine, lack of lordosis, poor extension, flexion stopped by pain.  + right straight leg raises, weaness on the right as compaired to the left.   Impression & Recommendations:  Problem # 1:  LOW BACK PAIN SYNDROME, SEVERE (ICD-724.2)  Needs to have a MRI, he is to get certified by [facility] system so that we can order this, would be nice to have prior to URO specialist apt. His updated medication list for this problem includes:    Adult Aspirin Low Strength 81 Mg Tbdp (Aspirin)    Diclofenac Sodium 75 Mg Tbec (Diclofenac sodium) .Marland Kitchen..Marland Kitchen Two times a day for knee pain and shoulder pain    Cyclobenzaprine Hcl 10 Mg Tabs (Cyclobenzaprine hcl) ..... One three times a day as needed for muscle spasms    Vicodin 5-500 Mg Tabs (Hydrocodone-acetaminophen) .Marland Kitchen... 2 tabs q 4 hours as needed  Orders: [facility]- Est  Level 4 (04540)  Problem # 2:  UTI (ICD-599.0)  UA WNL today, + hematuria a few weeks ago, patient is a long term smoker, expresses symptoms consistent with urinary retention.  Will refer to URO for cysto and eval. ? is this coming from his lumbar spine, he is to get certified by our hospital so we can image his lower back.  Remain on alpha blocker Orders: Urinalysis-[facility] (00000) Urology Referral (Urology) CBC-[facility] [id number]) [facility]- Est  Level 4 (60454)  Problem # 3:  DIABETES MELLITUS, TYPE II (ICD-250.00) Discussed importance of him getting in every 3 months for check up. His updated medication list for this problem includes:    Glipizide 10 Mg Tabs (Glipizide) ..... Bid    Metformin Hcl 1000 Mg Tabs (Metformin hcl) .Marland Kitchen..Marland Kitchen Two times a day    Lisinopril 20 Mg Tabs (Lisinopril) ..... One daily (dosage change with  next script, cancel 10 mg dosage)    Adult Aspirin Low Strength 81 Mg Tbdp (Aspirin)  Orders: A1C-[facility] (09811) Comp Met-[facility] (91478-29562) Lipid-[facility] (13086-57846) CBC-[facility] (96295) [facility]- Est  Level 4 (28413)  Problem # 4:  TOBACCO USER (ICD-305.1) Counseled to quit  Problem # 5:  SYNCOPE, VASOVAGAL (ICD-780.2)  one episode during deffication, not sure what to make of this.  he is in poor health, he needs a number of referrals and work-up but he has no health insurance, explained that he must come in regularly so that we can sorth out his problems one at a time.  Orders: [facility]- Est  Level 4 (24401)  Complete Medication List: 1)  Glipizide 10 Mg Tabs (Glipizide) .... Bid 2)  Metformin Hcl 1000 Mg Tabs (Metformin hcl) .... Two times a day 3)  Cardizem 60 Mg Tabs (Diltiazem hcl) .... Two times a day 4)  Lisinopril 20 Mg Tabs (Lisinopril) .... One daily (dosage change with next script, cancel 10 mg dosage) 5)  Adult Aspirin Low Strength 81 Mg Tbdp (Aspirin) 6)  Diclofenac Sodium 75 Mg Tbec (Diclofenac sodium) .... Two times a day for knee pain and shoulder pain 7)  Cyclobenzaprine Hcl 10 Mg Tabs (Cyclobenzaprine hcl) .... One three times a day as needed for muscle spasms 8)  Vicodin 5-500 Mg Tabs (Hydrocodone-acetaminophen) .... 2 tabs q 4 hours as needed 9)  Tamsulosin Hcl 0.4 Mg Caps (Tamsulosin hcl) .... One daily  Patient Instructions: 1)  Back:  stretch best you can, and get certified by [facility] so we can do an MRI  2)  Urine:  stay on the tamsulosin, I sent this into [facility], we have made to referral to Urology at [facility], we will let you know  3)  May use the pain pills and muscle relaxants for your back pain 4)  Retun in one month Prescriptions: TAMSULOSIN HCL 0.4 MG CAPS (TAMSULOSIN HCL) one daily  #30 x 3   Entered and Authorized by:   Luretha Murphy NP   Signed by:   Luretha Murphy NP on 04/16/2010   Method used:   Electronically to        [facility] (retail)        [address]       [place], [place]  [address]       Ph: [phone]       Fax: [phone]   RxID:   [id number] VICODIN 5-500 MG TABS (HYDROCODONE-ACETAMINOPHEN) 2 tabs q 4 hours as needed  #50 x 0   Entered and Authorized by:   Luretha Murphy NP  Signed by:   Luretha Murphy NP on 04/16/2010   Method used:   Print then Give to Patient   RxID:   [id number] CYCLOBENZAPRINE HCL 10 MG TABS (CYCLOBENZAPRINE HCL) one three times a day as needed for muscle spasms  #90 x 0   Entered and Authorized by:   Luretha Murphy NP   Signed by:   Luretha Murphy NP on 04/16/2010   Method used:   Print then Give to Patient   RxID:   [id number]    Orders Added: 1)  A1C-[facility] [83036] 2)  Urinalysis-[facility] [00000] 3)  Urology Referral [Urology] 4)  Comp Met-[facility] [[id number]] 5)  Lipid-[facility] [80061-22930] 6)  CBC-[facility] [85027] 7)  [facility]- Est  Level 4 [41324]     Prevention & Chronic Care Immunizations   Influenza vaccine: Not documented    Tetanus booster: 06/30/2006: given   Tetanus booster due: 06/29/2016    Pneumococcal vaccine: Not documented  Other Screening   PSA: 0.22  (10/06/2007)   PSA due due: 10/05/2008   Smoking status: current  (04/16/2010)   Smoking cessation counseling: yes  (12/09/2008)  Diabetes Mellitus   HgbA1C: 8.3  (11/21/2009)   Hemoglobin A1C due: 09/21/2008    Eye exam: Not documented   Diabetic eye exam action/deferral: Ophthalmology referral  (12/09/2008)    Foot exam: Not documented   Foot exam action/deferral: Do today   High risk foot: Not documented   Foot care education: Not documented    Urine microalbumin/creatinine ratio: Not documented   Urine microalbumin action/deferral: Ordered   Urine microalbumin/cr due: 10/05/2008  Lipids   Total Cholesterol: 123  (11/24/2009)   LDL: See Comment mg/dL  (40/01/2724)   LDL Direct: Not documented   HDL: 29  (11/24/2009)   Triglycerides: 452  (11/24/2009)  Hypertension   Last  Blood Pressure: 139 / 90  (04/16/2010)   Serum creatinine: 0.81  (07/22/2008)   Serum potassium 4.2  (07/22/2008) CMP ordered   Self-Management Support :    Diabetes self-management support: Not documented    Hypertension self-management support: Not documented   Laboratory Results   Urine Tests  Date/Time Received: April 16, 2010 11:43 AM  Date/Time Reported: April 16, 2010 12:19 PM   Routine Urinalysis   Color: yellow Appearance: Clear Glucose: negative   (Normal Range: Negative) Bilirubin: negative   (Normal Range: Negative) Ketone: negative   (Normal Range: Negative) Spec. Gravity: 1.020   (Normal Range: 1.003-1.035) Blood: negative   (Normal Range: Negative) pH: 7.5   (Normal Range: 5.0-8.0) Protein: 100   (Normal Range: Negative) Urobilinogen: 0.2   (Normal Range: 0-1) Nitrite: negative   (Normal Range: Negative) Leukocyte Esterace: negative   (Normal Range: Negative)  Urine Microscopic WBC/HPF: 1-5 RBC/HPF: rare Bacteria/HPF: trace Mucous/HPF: 1+ Epithelial/HPF: 1-5    Comments: occ sperm present.  Terese Door  April 16, 2010 12:18 PM      Appended Document: A1c results  Laboratory Results   Blood Tests   Date/Time Received: April 16, 2010 12:03 PM  Date/Time Reported: April 16, 2010 12:39 PM   HGBA1C: 7.6%   (Normal Range: Non-Diabetic - 3-6%   Control Diabetic - 6-8%)  Comments: ...........test performed by...........Marland KitchenTerese Door, CMA

## 2010-07-09 LAB — POCT URINALYSIS DIPSTICK
Bilirubin Urine: NEGATIVE
Nitrite: NEGATIVE
Protein, ur: >=300 mg/dL — AB
Specific Gravity, Urine: 1.015 (ref 1.005–1.030)
Urobilinogen, UA: 0.2 mg/dL (ref 0.0–1.0)
pH: 7 (ref 5.0–8.0)

## 2010-07-18 ENCOUNTER — Other Ambulatory Visit: Payer: Self-pay | Admitting: Family Medicine

## 2010-07-18 NOTE — Telephone Encounter (Signed)
Refill request

## 2010-07-19 NOTE — Telephone Encounter (Signed)
Printed and faxed to Nicolette Bang at [phone]

## 2010-07-29 DIAGNOSIS — IMO0001 Reserved for inherently not codable concepts without codable children: Secondary | ICD-10-CM

## 2010-07-29 DIAGNOSIS — Z9289 Personal history of other medical treatment: Secondary | ICD-10-CM

## 2010-07-29 HISTORY — DX: Reserved for inherently not codable concepts without codable children: IMO0001

## 2010-07-29 HISTORY — DX: Personal history of other medical treatment: Z92.89

## 2010-07-30 ENCOUNTER — Ambulatory Visit (INDEPENDENT_AMBULATORY_CARE_PROVIDER_SITE_OTHER): Payer: Self-pay

## 2010-07-30 ENCOUNTER — Inpatient Hospital Stay (INDEPENDENT_AMBULATORY_CARE_PROVIDER_SITE_OTHER)
Admission: RE | Admit: 2010-07-30 | Discharge: 2010-07-30 | Disposition: A | Discharge status: Home / Self Care | Payer: Self-pay | Source: Ambulatory Visit | Attending: Family Medicine | Admitting: Family Medicine

## 2010-07-30 DIAGNOSIS — R071 Chest pain on breathing: Secondary | ICD-10-CM

## 2010-08-02 ENCOUNTER — Encounter: Payer: Self-pay | Admitting: Family Medicine

## 2010-08-02 DIAGNOSIS — N401 Enlarged prostate with lower urinary tract symptoms: Secondary | ICD-10-CM

## 2010-08-14 LAB — URINE CULTURE: Colony Count: NO GROWTH

## 2010-08-14 LAB — POCT URINALYSIS DIP (DEVICE)
Glucose, UA: 500 mg/dL — AB
Hgb urine dipstick: NEGATIVE
Nitrite: NEGATIVE
Urobilinogen, UA: 0.2 mg/dL (ref 0.0–1.0)

## 2010-09-05 ENCOUNTER — Telehealth: Payer: Self-pay | Admitting: Family Medicine

## 2010-09-05 NOTE — Telephone Encounter (Signed)
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  EMT found him with a BP of 80/40, was admitted to ICU with Hbg low enough to receive 5 units of PRBC.    He had been going to [facility] for hematuria and obstruction, indigent care.  He reports one month of indwelling catheter, on antibiotics and a cysto. He said that for several weeks he put out pure blood in his catheter and when he called the group he was told it was normal.  After the cysto they wanted to do some procedure that would be an upfront cost of $2000, he went back to work to make the money and this happened.  He was seen in the ER at [facility] in April 2, for an injury on the job, no labs were done.  He did have a HCT of 12.2 in December of 2011.  He will be flown back to [facility] and get into see me as soon as possible.  I gave them the clinic fax number for records.

## 2010-09-05 NOTE — Telephone Encounter (Signed)
Wife calling to say that the hospital need to converse with you due to pt's admission.  Please call wife back for further details

## 2010-09-14 ENCOUNTER — Encounter: Payer: Self-pay | Admitting: Family Medicine

## 2010-09-14 ENCOUNTER — Ambulatory Visit (INDEPENDENT_AMBULATORY_CARE_PROVIDER_SITE_OTHER): Payer: Self-pay | Admitting: Family Medicine

## 2010-09-14 ENCOUNTER — Ambulatory Visit (HOSPITAL_COMMUNITY)
Admission: RE | Admit: 2010-09-14 | Discharge: 2010-09-14 | Disposition: A | Discharge status: Home / Self Care | Payer: Self-pay | Source: Ambulatory Visit | Attending: Family Medicine | Admitting: Family Medicine

## 2010-09-14 VITALS — BP 136/93 | HR 85 | Temp 98.4°F | Ht 79.0 in | Wt 330.0 lb

## 2010-09-14 DIAGNOSIS — I4891 Unspecified atrial fibrillation: Secondary | ICD-10-CM | POA: Insufficient documentation

## 2010-09-14 DIAGNOSIS — D500 Iron deficiency anemia secondary to blood loss (chronic): Secondary | ICD-10-CM | POA: Insufficient documentation

## 2010-09-14 DIAGNOSIS — N401 Enlarged prostate with lower urinary tract symptoms: Secondary | ICD-10-CM

## 2010-09-14 LAB — COMPREHENSIVE METABOLIC PANEL
Albumin: 4 g/dL (ref 3.5–5.2)
BUN: 14 mg/dL (ref 6–23)
Calcium: 9.9 mg/dL (ref 8.4–10.5)
Chloride: 105 mEq/L (ref 96–112)
Creat: 0.78 mg/dL (ref 0.40–1.50)
Glucose, Bld: 88 mg/dL (ref 70–99)
Total Bilirubin: 0.6 mg/dL (ref 0.3–1.2)

## 2010-09-14 LAB — CBC
Hemoglobin: 12.7 g/dL — ABNORMAL LOW (ref 13.0–17.0)
MCH: 26.6 pg (ref 26.0–34.0)
MCHC: 33.6 g/dL (ref 30.0–36.0)
Platelets: 278 10*3/uL (ref 150–400)
RDW: 14.2 % (ref 11.5–15.5)

## 2010-09-14 MED ORDER — HYDROCODONE-ACETAMINOPHEN 5-500 MG PO TABS: 1.0000 | ORAL_TABLET | Freq: Four times a day (QID) | ORAL | Status: DC | PRN

## 2010-09-14 NOTE — Op Note (Signed)
NAME:  Ray Hall, Ray Hall NO.:  [id number]   MEDICAL RECORD NO.:  [id number]                   PATIENT TYPE:  INP   LOCATION:  5035                                 FACILITY:  [facility]   PHYSICIAN:  Erasmo Leventhal, M.D.         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   PROCEDURE:  Open reduction and internal fixation of tibial plateau fracture  and associated tibial shaft fracture, with implementation of Norian bone  grafting.   SURGEON:  Erasmo Leventhal, M.D.   ASSISTANT:  Jaquelyn Bitter. Chabon, P.A.   ANESTHESIA:  General.   ESTIMATED BLOOD LOSS:  Less than 50 mL.   DRAINS:  One Hemovac.   COMPLICATIONS:  None.   TOURNIQUET TIME:  1 hour 55 minutes at 350 mmHg.   COMPLICATIONS:  None.   DISPOSITION:  To PACU stable.   CLINICAL NOTE:  In the holding area, I evaluated the patient.  His pulses  were intact.  He had no evidence of compartment syndrome.  He did have some  weak dorsiflexion, and we will follow that postoperatively.  He had medical  clearance by Gaspar Garbe, M.D.   DESCRIPTION OF PROCEDURE:  The patient was counseled in the holding area,  the correct side was identified, the chart was signed appropriately.  Preoperative medical clearance was given by Dr. Wylene Simmer, and we also got  another EKG that was read by Guadalupe Maple, M.D., and felt to be adequate.  IV Ancef was given.  He was taken to the operating room and placed in the  supine position under general anesthesia.  The left knee was examined with  full extension, flexion to 90 degrees, mild varus-valgus  instability in full  extension.  Pulses remained intact.  Elevated and prepped with DuraPrep and  all draped in a sterile fashion.  Exsanguinated with an Esmarch.  The  tourniquet was inflated to 350 mmHg.  A lateral hockey stick-type incision  was made through the skin and subcutaneous tissue parallel to the tibial  border, then going proximally over Gerdy's tubercle.  Skin flaps were  developed and kept the appropriate thickness.  The fibular head was palpated  and followed throughout the entire case, as was the common femoral nerve.  At this time the periosteum was opened distally on the tibial crest going  parallel to the tibial tubercle and patellar tendon and going proximally to  the IT band.  At this time the fracture was identified, and it was opened.  A submeniscal approach was performed and also an arthrotomy proximally.  There was a large hemarthrosis in the joint.  Now the lateral condyle  fracture was opened and the fragments were found to be extremely comminuted  and actually extended to the medial side.  These were then elevated  meticulously and repaired as well as possible and provisionally held with  two 2.0 mm pins.  At this point in time we felt we had satisfactory  position.  The wounds were copiously irrigated several times.  This was  going to leave a void in the proximal tibial metaphyseal region.  I realized  that and was later going to fill it with a Synthes Norian bone graft  substitute. An appropriate-sized Synthes proximal tibia LCD periarticular  locking plate was applied.  Then utilizing standard technique, we closed it.  The large articular clamp was used to puncture more medially into the plate  itself, closing the condyle well, holding the fracture at the tibial plateau  articular surface in an as anatomic position as possible, but there was  marked comminution. The proximal locking screws were applied, were checked  in the AP and lateral plane and found to  have excellent reduction of the  tibial plateau fracture fragments and excellent placement of the implants.  Then distally, alternating locking holes were then filled in unicortical  fashion utilizing the standard Synthes technique.  We made sure the plate  went below the tibial shaft extension, and it did. Proximally the void was  then irrigated.  The utilizing the Synthes Norian bone graft substitute, it  was then placed into the fracture in a retrograde fashion, filling the void  and allowing this to then set up inside the body.  Checked in AP and lateral  planes to make sure there was no extrusion, and there was not. All wounds  were irrigated during the closure several times.  Meticulous closure of the  meniscus was performed with 2-0 PDS suture, arthrotomy with #1 Vicryl.  I  had also performed an anterior compartment fasciotomy going distally, and  this was left open to prevent a compartment syndrome.  In addition, the  anterior compartment muscles and neurovascular bundle structures were  protected throughout the entire case.  The IT band was meticulously closed.  Subcu closed with Vicryl.  A drain was placed and left subfascial and  brought out distally.  Skin closed with staples.  A sterile compressive  dressing was applied.  The tourniquet was deflated, excellent pulse of the  foot and ankle at the end of the case.  He was placed into a hinged knee  brace in a slight amount of flexion.  He was given another gram of Ancef  intravenously when the tourniquet deflated.  There were no complications.  Sponge and needle count were correct.  He was then gently awakened, and he  was taken from the operating room to the PACU in stable condition.                                               Erasmo Leventhal, M.D.    RAC/MEDQ  D:  07/01/2002  T:  07/02/2002  Job:  [id number]

## 2010-09-14 NOTE — Progress Notes (Signed)
  Subjective:    Patient ID: Ray Hall, male    DOB: [birth year], 50 y.o.   MRN: [id number]  HPI Long complicated story:  Patient was referred to [facility] for urinary obstruction, hematuria and recurrent UTI as he has no health insurance (records requested).  He reports that they placed a foley for 2 weeks while he was on antibiotics to treat the infection and retention.  During that time he reports 7-10 of the days he poured out frank blood from the foley emptying the bag 5-6 times per day.  He called them and they told him it was normal and to return at the designated 2 weeks.  By the time he returned he was flowing clear urine, he was scoped (he called it a camera) and he was told that they could not find anything but they wanted to do another procedure that cost $2000.  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  During the trip he became weak and light headed, he called EMS and when picked up his BP was 80/40.  He had a several week stay at [facility] in [place], [place].  Records reviewed indicated that he had a low hemoglobin (6.6) on admission (last was 12.1 in December 2011 here).  He was in atrial fibrillation /flutter with a rapid rate.  He was given 3 units of blood and worked up for causes. His blood sugars were very elevated during the acute hospital stay.  CT of abdomen showed mild left hydronephrosis.  Bilateral LE dopplers negative for DVT.  His urine grew out >100,00 col of pseudomonas susceptible to quinolones.   Record release from [facility] urology has been sent, and will review case with supervising MD in 2 weeks once all the information is in.  Ray Hall also reports a 15 pound weight loss in April.  Review of Systems  Constitutional: Negative for fatigue.       See HPI. Today he reports feeling well in general.  He has no urinary complains, denies CP and SOB.  He wants to return to work.  Respiratory: Negative for chest tightness and shortness of  breath.   Cardiovascular: Negative for chest pain, palpitations and leg swelling.  Genitourinary: Negative for dysuria and difficulty urinating.  Neurological: Negative for light-headedness.       Objective:   Physical Exam  Constitutional:       Alert, large AA male in no distress  Cardiovascular: Normal rate and normal heart sounds.        EKG basically regular with ? A-flutter and T wave changes in inferior leads-await cardiology over read.  Pulmonary/Chest: Effort normal and breath sounds normal. No respiratory distress.          Assessment & Plan:

## 2010-09-14 NOTE — Consult Note (Signed)
NAME:  Ray Hall, Ray Hall                         ACCOUNT NO.:  [id number]   MEDICAL RECORD NO.:  [id number]                   PATIENT TYPE:  INP   LOCATION:  5035                                 FACILITY:  [facility]   PHYSICIAN:  Gaspar Garbe, M.D.            DATE OF BIRTH:  09/06/1960   DATE OF CONSULTATION:  07/02/2002  DATE OF DISCHARGE:                                   CONSULTATION   REFERRING PHYSICIAN:  Erasmo Leventhal.   REASON FOR CONSULTATION:  Consultation for diabetes management.   HISTORY OF PRESENT ILLNESS:  The patient is a 50 year old black male with  history of diabetes mellitus, type 2, which has been uncontrolled due to  lack of followup with prior physician.  I was asked to see the patient in  consultation as an outpatient for Dr. Thomasena Edis for preoperative clearance and  had done so.  The patient was given samples of Avandia 4 mg per day to start  prior to his surgery for diabetes control, however, the patient failed to  start these on an outpatient basis.  He underwent surgery on July 01, 2002  to repair a fracture around his left knee.  I have been asked to see the  patient as an inpatient for diabetes management.   PAST MEDICAL HISTORY:  Pneumonia in 2003; diabetes mellitus, type 2,  diagnosed in the past six months; borderline hypertension, ? due to pain  versus his normal level.   PAST SURGICAL HISTORY:  Tonsillectomy as a child.  The patient had a left  tibial plateau fracture repaired yesterday.   SOCIAL HISTORY:  The patient is divorced with one child, is a Scientist, forensic and a  truck driver, smokes one pack of cigarettes a day and has never been  hospitalized for asthma or breathing-related illnesses.   FAMILY HISTORY:  Father's side -- unaware.  Mother died at age 41 of  abdominal cancer.  There is diabetes and hypertension as well as  osteoarthritis in his mother's side of the family.   ALLERGIES:  No known drug allergies.   MEDICATIONS:  Patient  was to start Avandia, had been given Amaryl by a  covering physician for [facility] earlier today.  The  patient is receiving Dilaudid and morphine on PCA as well as a full aspirin,  received 2 mg of Amaryl in the morning and is currently on a Regular insulin  sliding scale with CBG 101-150, 2 units; 151-200, 3 units; 201-250, 6 units;  251-300, 9 units; 301-350, 12 units; and greater than 350, 15 units and call  M.D.; with decreased nighttime coverage of 201-250 equals 2 units, 251-300  equals 3 units, 301-350 equals 4 units, 351 or greater equals 5 units.  The  patient is also on Robaxin 500 mg, received Ancef prior to surgery as well  as treated with Compazine, Reglan, Zofran and Benadryl as p.r.n.'s, as well  as laxative  or enema or choice for constipation and Tylenol.   PHYSICAL EXAMINATION:  VITAL SIGNS:  Vitals this morning show a temperature  of 99.4, pulse 97, respiratory rate 20, blood pressure 154/89, saturating  97% on 2 L.  He was not wearing oxygen when I spoke to him earlier today.  His blood sugar after surgery was 188, with his current this morning being  234, keeping in mind that he does not have a basal drug on board other than  a sliding scale at this time.  HEENT:  Normocephalic, atraumatic.  PERRLA.  EOMI.  ENT within normal  limits.  HEART:  Regular rate and rhythm.  No murmur, rub or gallop.  LUNGS:  Lungs clear to auscultation bilaterally.  ABDOMEN:  Abdomen soft and nontender with normoactive bowel sounds.  EXTREMITIES:  The patient has his left leg in a brace as he is status post  surgery.   ASSESSMENT AND PLAN:  1. The patient has diabetes mellitus, type 2.  Unfortunately, he did not     start his oral medications before having his surgery, which means that he     does not have any basal-type coverage and his sugars will be expected to     be high for the next day or so and will require more sliding-scale     insulin.  I am not certain as to  why he did not start this.  He indicated     that I told him not to start, however, I was very clear in written     instruction to him as well that he needed to start this medication before     his surgery and samples were given.  The patient was given 2 mg of Amaryl     this morning to help bring his blood sugars down to basal level, but I     feel that he would do better with Avandia as he is a large gentleman and     most likely has insulin resistance, which the Avandia will do well with.     I am aware of the side-effects of leg swelling with Avandia, but I do not     feel that in his current condition, that the borderline risk of an amount     of leg swelling will overwhelm the benefit of this kind of medication,     given the patient's poorly controlled A1c greater than 8.0 and the fact     that he has been on treatment for a relatively short period of time.  He     is being covered with sliding-scale insulin as above.  2. Hypertension.  Will start Altace 5 mg a day, most likely for long-term     renal protection and because his blood pressure is higher than 130/85     goal which we have set for him.  Some of this may be due to his pain     level, however, I only wanted to start him on one medication at a time in     the office and he was instructed to have beta blockade prior to his     surgery.  These recommendations were given to Dr. Thomasena Edis in a letter     faxed to him as well as discussed with him on the phone prior to     patient's surgery.  Gaspar Garbe, M.D.    RWT/MEDQ  D:  07/02/2002  T:  07/03/2002  Job:  [id number]   cc:   Erasmo Leventhal, M.D.  [address]  [place]  [place] [address]  Fax: [phone]

## 2010-09-14 NOTE — Discharge Summary (Signed)
[facility]  Patient:    Ray Hall, Ray Hall Visit Number: [id number] MRN: [id number]          Service Type: MED Location: 3W 0340 01 Attending Physician:  Wilson Singer Dictated by:   Lilly Cove, M.D. Admit Date:  10/09/2001 Discharge Date: 10/13/2001                             Discharge Summary  FINAL DISCHARGE DIAGNOSES: 1. Right upper lobe pneumonia. 2. Newly diagnosed diabetes mellitus. 3. Hypertension.  CONDITION ON DISCHARGE:  Stable.  MEDICATIONS ON DISCHARGE:  Antibiotics and oral hypoglycemic agents the patient will collect from the office.  HISTORY OF PRESENT ILLNESS:  This 50 year old man was admitted with a 2-day history of headache, nonproductive cough, and fever.  He was found to have a right upper lobe pneumonia and also random blood glucose of 298.  Please see initial history and physical examination for initial evaluation.  HOSPITAL PROGRESS:  He had a CSF performed to rule out meningitis and this was negative.  A CT scan of the head also was negative.  Chest x-ray showed right upper lobe pneumonia.  ECG shows T wave changes widespread.  His sodium on admission was 125 and after rehydration and treatment of his pneumonia this improved prior to discharge to some degree.  He improved significantly with intravenous antibiotics.  His sugars were controlled on a sliding scale of insulin initially.  He was earnestly started on metformin and Glucotrol.  By October 12, 2001 his fever had resolved, he was feeling well, he was coughing up copious amounts of sputum.  Sputum and blood cultures were negative.  By October 13, 2001 he was much improved and remained afebrile for approximately 36 hours.  Sugars had improved also.  He was able to be discharged home in stable condition and will follow up in my office soon. Dictated by:   Lilly Cove, M.D. Attending Physician:  Wilson Singer DD:  10/29/01 TD:  11/02/01 Job:  [id number]

## 2010-09-14 NOTE — H&P (Signed)
[facility]  Patient:    Ray Hall, Ray Hall Visit Number: [id number] MRN: [id number]          Service Type: MED Location: 3W 0340 01 Attending Physician:  Wilson Singer Dictated by:   Lilly Cove, M.D. Admit Date:  10/09/2001                           History and Physical  HISTORY OF PRESENT ILLNESS:  This is an unassigned patient, 50 years old, who gives a two-day history of headache, nonproductive cough with fever.  He was found to have right upper lobe pneumonia on chest x-ray in the emergency room and, also, his random blood glucose was 299.  He does describe a significant weight loss in the last one year associated with polydipsia and polyuria.  His mother, who has died now, was diabetic and hypertensive.  MEDICATIONS:  None.  ALLERGIES:  None.  SOCIAL HISTORY:  The patient is a divorced man who now lives with his girlfriend.  He smokes one-half pack of cigarettes per day.  He does not drink alcohol.  He works as a Scientist, forensic.  FAMILY HISTORY:  Mother had diabetes and hypertension.  He did not know his father.  He does not have any siblings.  He does not have any children.  REVIEW OF SYSTEMS:  Apart from the symptoms mentioned above, there are no other symptoms referable to the cardiovascular, respiratory, musculoskeletal, endocrine, dermatological, psychiatric, genitourinary systems.  PHYSICAL EXAMINATION:  VITAL SIGNS:  On arrival his temperature was 103.9, but after a few hours in the hospital it has decreased to 99.7.  He does not look clinically dehydrated.  Blood pressure 118/66, pulse 80 per minute in a sinus rhythm.  CARDIOVASCULAR:  Heart sounds are present and normal, with no murmurs or added sounds.  LUNGS:  Lung fields show reduced air entry in the right lung with right upper zone crackles.  ABDOMEN:  Soft and nontender.  With no hepatosplenomegaly.  NEUROLOGIC:  Alert and oriented.  With no focal neurologic signs.  There  is no meningism.  LABORATORY DATA:  White blood cell count 11.2, hemoglobin 12.6.  Sodium 125, potassium 3.4, glucose 299.  Lumbar puncture has been done, and CSF does not show any evidence of meningitis.  A CT head scan also has been done, and there were no acute changes.  Chest x-ray shows right upper lobe infiltrate.  Electrocardiogram shows T-wave changes which are widespread inferiorly, anteriorly, and laterally.  IMPRESSION: 1. Right upper lobe pneumonia. 2. Newly diagnosed type 2 diabetes. 3. Probable hypertension with left ventricular hypertrophy changes on    electrocardiogram. 4. Obesity.  PLAN: 1. Intravenous antibiotics. 2. Lantus insulin with sliding scale of insulin.  He will get diabetic    teaching. 3. Echocardiogram to look at left ventricular size and function. 4. Check lipids. 5. He will be given intravenous fluids also. 6. Further recommendations will depend upon the patients progress. Dictated by:   Lilly Cove, M.D. Attending Physician:  Wilson Singer DD:  10/09/01 TD:  10/12/01 Job: [id number]

## 2010-09-14 NOTE — Discharge Summary (Signed)
NAME:  Ray Hall, Ray Hall NO.:  [id number]   MEDICAL RECORD NO.:  [id number]                   PATIENT TYPE:  INP   LOCATION:  5035                                 FACILITY:  [facility]   PHYSICIAN:  Erasmo Leventhal, M.D.         DATE OF BIRTH:  [birth year]   DATE OF ADMISSION:  07/01/2002  DATE OF DISCHARGE:  07/04/2002                                 DISCHARGE SUMMARY   ADMISSION DIAGNOSIS:  Tibial plateau fracture, left leg.   DISCHARGE DIAGNOSIS:  Tibial plateau fracture, left leg.   OPERATIONS:  Open reduction and internal fixation of tibial plateau  fracture, left tibia.   BRIEF HISTORY:  This is a 50 year old gentleman with a history of a fall the  previous Friday.  He had pain and swelling in his knee.  He was seen at  [facility] emergency room where a tibial plateau fracture was noted.  He  was placed in a knee immobilizer and sent to our office.  He had a CT scan  obtained, which more clearly delineated the extent of the fracture, and  after reviewing the fracture with the patient, he is now scheduled for open  reduction and internal fixation.  Surgery risks, benefits, and aftercare  were discussed in detail with the patient, and surgery to go ahead as  scheduled.   LABORATORY VALUES:  Admission CBC within normal limits with the exception of  the hematocrit, which was slightly low at 38.7.  Hemoglobin and hematocrit  remained stable throughout the hospitalization.  Admission BMET  showed the  glucose high at 88; repeat level on the 5th was high at 227 with slight  hyponatremia at 133.  Hemoglobin A1c is 8.2.   COURSE IN THE HOSPITAL:  Patient tolerated the operative procedure well. He  had moderate pain.  He was febrile to 99.4 the first postoperative day.  His  Hemovac had a total of 240 out, and drain was removed.  Sensation was intact  in the foot.  Patient could discriminate warm, cold, sharp, dull throughout  the foot.  There was no  calf tenderness.  His dressing was dry.  His lungs  were clear.  Heart regular rate and rhythm.  Medicine service consult was  obtained to follow the patient's elevated glucose.  On second postoperative  day, patient remained stable.  Pain was a problem but was controlled with  pain medication.  He had weak dorsiflexors secondary to pain, but  neurovascular status remained grossly intact.  The following postoperative  day, vital signs remained stable, dressing was dry, neurovascularly his leg  remained grossly intact, and patient was up in physical therapy.  Final  postoperative day, the patient was afebrile, neurovascular status was  grossly intact in his leg.  He was subsequently discharged home for followup  in the office by [place] in 10 days and followup by medicine per their  recommendations.   CONDITION ON DISCHARGE:  Improved.   DISCHARGE MEDICATIONS:  1. Percocet 1-2 q.6h. p.r.n. pain.  2. Keflex 500 mg 1 p.o. q.i.d.   PLAN:  Follow up in the office in 10 days.    DISCHARGE INSTRUCTIONS:  He was instructed to use his crutches, remain  nonweightbearing, do his therapy as instructed, to follow up with the  medical doctors for continued monitoring and evaluation of his diabetes, and  to call if problems or questions arise.     Jaquelyn Bitter. Chabon, P.A.                   Erasmo Leventhal, M.D.    SJC/MEDQ  D:  08/10/2002  T:  08/10/2002  Job:  [id number]

## 2010-09-14 NOTE — Patient Instructions (Signed)
Geriatric clinic in 2 weeks so Dr. Sheffield Slider can see and evaluate patient with me. May return to work

## 2010-09-14 NOTE — Group Therapy Note (Signed)
Consult requested by Cristi Loron, M.D.   REASON FOR REFERRAL:  Back pain, fell on buttocks December 19, 2004, slipped  on oil at work while pulling up on a pallet.   HISTORY:  A 50 year old male whose past medical history includes a patellar  fracture with reconstruction of the left lower extremity due to a fall but  otherwise has been quite healthy, has been told he fell onto his buttocks  December 19, 2004.  He was treated initially by urgent care, Dr. Cleta Alberts, with  Flexeril and Percocet, but after lack of improvement after one month was  sent for MRI without contrast, which demonstrated a right lateral annular  protrusion/tear superimposed upon spondylosis at L3-4, and a diffuse disk  bulge, L4-5, shallow disk bulge at T11-12.  He saw Dr. Lovell Sheehan.  Describes  some back, bilateral hip pain, some left leg tingling and numbness, which  has improved since that time.  He went back to work in December for a couple  of weeks but had difficulty lifting 50-pound packages, which was part of his  job, and was taken out of work again by Dr. Lovell Sheehan.   He has not had any injections.  He has not had any physical therapy.  At  home he is doing light housework, all his self-care.  He can walk at least a  half hour at a time.  He climbs steps.  He drives.  His pain goes from a 2  to an 8 depending on activity and is worse with bending and standing.  His  previous job was as a [employer] fast streamer.  In the past he was a Scientist, forensic.   REVIEW OF SYSTEMS:  Positive for numbness and spasms and limb swelling.  Diabetes has been diagnosed two years ago.  He is on glipizide 5 mg b.i.d.   SOCIAL HISTORY:  Married.  Smokes a pack a day.   FAMILY HISTORY:  Diabetes, high blood pressure.   MEDICATIONS:  1.  Cyclobenzaprine 10 mg t.i.d.  2.  Etodolac 400 mg b.i.d.  3.  He has tried some Lortab, but this is not helpful and he has no more.   PHYSICAL EXAMINATION:  VITAL SIGNS:  145/85, pulse 98, respiratory rate  16,  O2 saturation 100% on room air.  GENERAL:  No acute distress, alert and oriented x3.  Affect bright and  alert.  Height is 6 feet 7 inches, weight 335 pounds.  MUSCULOSKELETAL/NEUROLOGIC:  He had no tenderness to palpation along his  thoracic spine.  His lumbar spine has no tenderness but at the PSIS he does  have some tenderness and across his gluteus medius area.  He is able to bend  forward and this tends to relieve his pain, but extending at the spine  increases pain.  He also notes relief with pulling his knees to his chest.   He has full strength, bilateral upper and lower extremities.  He is able to  do toe-walk and heel-walk.  His knees show no redness or swelling, neither  do the ankles or extremities.  His sensation is intact.  His deep tendon  reflexes are difficult to elicit due to poor relaxation.  He has no muscle  atrophy.   IMPRESSION:  Lumbar and buttock axial pain exacerbated by extension.  Pain  generators suspected to include sacroiliac joint versus lumbar facets.  No  significant radicular component noted at this time.   PLAN:  1.  Will institute Lidoderm patch 5%, on 12, off  12.  2.  Continue cyclobenzaprine 10 mg t.i.d.  3.  Continue etodolac 400 mg b.i.d.  4.  Schedule for bilateral sacroiliac injections.  May need to proceed on to      facets if not successful.  5.  He will follow up with Dr. Lovell Sheehan, and by that time I would hope that      he is experiencing some pain relief so that he can go back at least at a      light-duty level.      Erick Colace, M.D.  Electronically Signed     AEK/MedQ  D:  07/05/2005 14:00:31  T:  07/06/2005 16:12:09  Job #:  [id number]

## 2010-09-17 ENCOUNTER — Telehealth: Payer: Self-pay | Admitting: Family Medicine

## 2010-09-17 ENCOUNTER — Encounter: Payer: Self-pay | Admitting: Family Medicine

## 2010-09-17 DIAGNOSIS — I480 Paroxysmal atrial fibrillation: Secondary | ICD-10-CM | POA: Insufficient documentation

## 2010-09-17 NOTE — Assessment & Plan Note (Signed)
?   Source, BPH vs lower back neuropathy.  Did have undiagnosed DM for years prior to coming here for medical care.  Hopefully the records from the urology dept at [facility] will help

## 2010-09-17 NOTE — Assessment & Plan Note (Signed)
Fairly new patient to me, apparently had a past history of rapid heart rate and was on Diltiazem for that.  Suspect acute illness of sudden blood loss may have put him back in A fib/flutter.  According to records from [place] hospital he chemically converted on a Amiodarone drip.  The did not anticoagulate as he had acute blood loss.  EF mid ly reduced on nuclear stress teating, and diagnosis of non-ischemic cardiomyopathy was mentioned.

## 2010-09-17 NOTE — Telephone Encounter (Signed)
Faxed note with signature (Dr.[email or web address]) Arlyss Repress

## 2010-09-17 NOTE — Telephone Encounter (Signed)
pts job requires a signature on the excuse note, once signed fax to pts employer at [phone] attn: Ether Griffins.

## 2010-09-17 NOTE — Assessment & Plan Note (Signed)
Good glycemic control on oral agents.  Had elevated blood sugars in [place] but was on stress levels of steroids.

## 2010-09-17 NOTE — Assessment & Plan Note (Signed)
Quits several weeks before problems he was admitted to the [facility].

## 2010-09-17 NOTE — Assessment & Plan Note (Signed)
Putting the pieces together suspcet the acute blood loss occurred at the 2 week interval that he had the indwelling foley.  He had a normal blood count in December 2011, and in [place] it was 6.3.  Will await [facility] documentation.

## 2010-09-17 NOTE — Assessment & Plan Note (Signed)
Will await records from [facility], sounds as if foley was placed to drain urine in the face of urinary retention. Unknown source of urinary retention, suspected BPH but it is possible could be coming from lower back as he has severe LBP.  Because he has no health insurance he has not had full work up.  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  Will review case in Geriatric clinic with primary MD in 2 weeks

## 2010-09-18 ENCOUNTER — Telehealth: Payer: Self-pay | Admitting: Family Medicine

## 2010-09-18 ENCOUNTER — Other Ambulatory Visit: Payer: Self-pay | Admitting: Family Medicine

## 2010-09-18 ENCOUNTER — Encounter: Payer: Self-pay | Admitting: Family Medicine

## 2010-09-18 DIAGNOSIS — I480 Paroxysmal atrial fibrillation: Secondary | ICD-10-CM

## 2010-09-18 NOTE — Telephone Encounter (Signed)
Tried to call and phone number was incorrect

## 2010-09-18 NOTE — Progress Notes (Signed)
Review of records from Mr Heidinger's recent episode of anemia and hospitalization in August 01, 2010 in [place]. See scanned records from [facility] for complete details. I have reviewed all records including his chart here. Briefly, Mr Fleek  Was admitted for chest pain and SOB. He was found to be anemic and in  A Flutter with 2:1 block with associated atrial ectopy, admitting heart rate of 130 range. He was hypotensive and found to have a hemoglobin of  8.2 with and MCV of 83.6.  He has history of a prior episode of atrial fibrilltation with subsequent (apparently successful) cardioversion.  In review of his course, it is likely that his chest pain and SOB were related to his anemia and his underlying sickle cell trait.  The return to atrial fibrillation  Was also likely related to the stress on his heart from the anemia in this setting. The anemia seems a direct result  Of his genitourinary bleeding that seems to have resolved. There was a negative (by report of patient) cystoscope and the CT showed mild hydronephrosis only. It does not seem like he would have any more risk of recurrence of this GU bleeding than the average person.  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 This correlates with the results of the 2-D ECHO which revealed moderate to severe concentric LVH and moderate global hypokinesis with an estimated EF of 40-45%.  Borderline dilation of left atrium. 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 His borderline  dilated left atrium and his risk of prior episode of atrial fibrillation with subsequent cardioversion indicates to me that he is at risk for future issues with atrial fibrillation. It seems he is on short acting diltiazem and lopressor at this time and  Those are  appropriate but switching to teh extended release diltiazem would probably be a good idea.   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   I would recommend he receive cardiology consult for clearance to operate under CDL  and to follow his cardiac issues.   Per Psychologist, clinical Administration:  "A person is physically qualified to drive a commercial motor vehicle if that person: Has no current clinical diagnosis of myocardial infarction, angina pectoris, coronary insufficiency, thrombosis. or Any other cardiovascular disease of a variety known to be accompanied by syncope, dyspnea, collapse, or congestive cardiac failure. The term "has no current clinical diagnosis of" is specifically designed to encompass, (1) a current cardiovascular condition; or (2) a cardiovascular condition which has not fully stabilized regardless of the time limit. The term "known to be accompanied by" is designed to include a clinical diagnosis of a cardiovascular disease (1) which is accompanied by symptoms of syncope, dyspnea, collapse, or congestive cardiac failure; and or (2) which is likely to cause syncope, dyspnea, collapse, or congestive cardiac failure. It is the intent of the Psychologist, clinical Regulations to render unqualified, a driver who has a current cardiovascular disease which is accompanied by and/or likely to cause symptoms of syncope, dyspnea, collapse, or congestive cardiac failure. However, the subjective decision of whether the nature and severity of  an individual's condition will likely cause symptoms of cardiovascular insufficiency is on an individual basis and qualification rests with the medical examiner and the motor carrier. In those cases where there is an occurrence of cardiovascular insufficiency (myocardial  infarction, thrombosis, etc.), it is suggested that, before a driver is certified, he/she have a normal resting and stress ECG, no residual complications, no physical limitations, and is taking no medication likely to interfere with safe driving. Coronary artery bypass surgery and pacemaker implantation are remedial procedures and thus not unqualifying. Implantable cardioverter defibrillators are disqualifying due to risk of syncope. Coumadin is a medical treatment which can improve the health and safety of the driver and should not, by its use, medically disqualify the commercial driver. The emphasis should be on the underlying medical condition(s) which require treatment and the general health of the driver. [employer] should be contacted at [phone] for additional recommendations regarding the physical qualification of drivers on coumadin. " (See Cardiovascular Advisory Panel Guidelines for the Medical Examination of Commercial Motor Vehicle Drivers at: http://www.medicalance.com.htm

## 2010-09-18 NOTE — Telephone Encounter (Signed)
Called patient and informed letter was faxed to employer.Busick, Rodena Medin

## 2010-09-18 NOTE — Telephone Encounter (Signed)
Ray Hall is asking to speak with Ray Hall about Ray Hall.  He was told that the papers were faxed to Ray Hall, but he is still asking to speak to Ray Hall.

## 2010-09-18 NOTE — Telephone Encounter (Signed)
Calling re: letter that was faxed yesterday (see previous phone note) says pts employer received a letter stating pt could not return full duty. Looks like Dr. Jennette Kettle made extra documentation stating pt needed to see Cardiologist before being released, pt is not aware of this, please clarify & call pts wife back.

## 2010-09-20 ENCOUNTER — Encounter: Payer: Self-pay | Admitting: Family Medicine

## 2010-09-20 DIAGNOSIS — I4892 Unspecified atrial flutter: Secondary | ICD-10-CM | POA: Insufficient documentation

## 2010-09-20 NOTE — Telephone Encounter (Signed)
Wife is returning call, wants Luretha Murphy to call [phone]

## 2010-09-20 NOTE — Telephone Encounter (Signed)
Long discussion:  He has been out of work for 2 months for medical problems, has has over $50,000 in bills from the hospital in [place].  With the new onset of atrial flutter/fib, Dr. Jennette Kettle after reviewing case did not feel that it was safe for him to go back on the road.  He has an apt with [facility] on 10/01/10 at 1 PM.  That is the day he is to be evicted from his apartment.  He says that all the family are either dead or in the same financial situation that he is.  He will go to [employer], and call the financial advisor at [facility] as well.  He is calling social service agencies today, none have a answer for him.  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  He has to get back on the job in order to not be homeless.  He is almost out of medication, hopefully he will qualify for MAP and they can get him his meds.  Very sad situation.

## 2010-10-08 ENCOUNTER — Ambulatory Visit: Payer: Self-pay | Admitting: Family Medicine

## 2010-10-15 HISTORY — PX: OTHER SURGICAL HISTORY: SHX169

## 2010-10-16 DIAGNOSIS — Z8679 Personal history of other diseases of the circulatory system: Secondary | ICD-10-CM

## 2010-10-16 HISTORY — PX: RADIOFREQUENCY ABLATION: SHX2290

## 2010-10-16 HISTORY — DX: Personal history of other diseases of the circulatory system: Z86.79

## 2010-10-17 ENCOUNTER — Encounter: Payer: Self-pay | Admitting: Family Medicine

## 2010-10-17 ENCOUNTER — Other Ambulatory Visit: Payer: Self-pay | Admitting: Family Medicine

## 2010-10-17 MED ORDER — METOPROLOL TARTRATE 25 MG PO TABS: 25.0000 mg | ORAL_TABLET | Freq: Two times a day (BID) | ORAL | Status: DC

## 2010-10-17 NOTE — Telephone Encounter (Signed)
Pt was getting metoprolol tartrate from [place] and needs refill to [facility]

## 2010-10-26 ENCOUNTER — Inpatient Hospital Stay: Payer: Self-pay | Admitting: Family Medicine

## 2010-11-02 ENCOUNTER — Other Ambulatory Visit: Payer: Self-pay | Admitting: Family Medicine

## 2010-11-02 NOTE — Telephone Encounter (Signed)
Needs refill for Hydrocodone.  Please call when ready.

## 2010-11-02 NOTE — Telephone Encounter (Signed)
Will fwd. To [facility] .Suzzanne Brunkhorst  

## 2010-11-05 ENCOUNTER — Telehealth: Payer: Self-pay | Admitting: *Deleted

## 2010-11-05 MED ORDER — HYDROCODONE-ACETAMINOPHEN 5-500 MG PO TABS: 1.0000 | ORAL_TABLET | Freq: Four times a day (QID) | ORAL | Status: DC | PRN

## 2010-11-05 NOTE — Telephone Encounter (Signed)
Decreased usual amount as I worried that he was developing urinary retention from the pain meds or possibly from the spinal problems that he has, that have not been worked up.  He has had so many serious medical problems that have caused him to be homeless and not work for a while.  He has no insurance and had required emergency hospitalization out of state, cardioversion at [facility], and urology interventions for urinary rentention. 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  I have never felt that he was a drug seeker, but do worry about the side effects when he is on the road.  He has always reassured me that he does not take the medication until he has pulled over for the night.

## 2010-11-05 NOTE — Telephone Encounter (Signed)
Called pt to pick up rx (vicodin) .Arlyss Repress

## 2010-11-10 ENCOUNTER — Other Ambulatory Visit: Payer: Self-pay | Admitting: Family Medicine

## 2010-11-11 NOTE — Telephone Encounter (Signed)
Refill request

## 2010-12-04 ENCOUNTER — Other Ambulatory Visit: Payer: Self-pay | Admitting: Family Medicine

## 2010-12-04 DIAGNOSIS — I10 Essential (primary) hypertension: Secondary | ICD-10-CM

## 2010-12-04 NOTE — Telephone Encounter (Signed)
Will route this refill for diltiazem request to Dr. Sheffield Slider. A warning came up regarding cardizem and beta blocker. Will ask Dr. Sheffield Slider to advise.

## 2010-12-04 NOTE — Telephone Encounter (Signed)
Refill request for  Ray Hall. Patient. Please see note.  I attempted to refill but a warnnig came up about cardiazem and beta blocker. Please advise.

## 2010-12-05 ENCOUNTER — Encounter: Payer: Self-pay | Admitting: Family Medicine

## 2010-12-05 ENCOUNTER — Ambulatory Visit (INDEPENDENT_AMBULATORY_CARE_PROVIDER_SITE_OTHER): Payer: Self-pay | Admitting: Family Medicine

## 2010-12-05 VITALS — BP 154/104 | HR 94 | Temp 97.4°F | Wt 345.7 lb

## 2010-12-05 DIAGNOSIS — M549 Dorsalgia, unspecified: Secondary | ICD-10-CM | POA: Insufficient documentation

## 2010-12-05 MED ORDER — MELOXICAM 15 MG PO TABS: 15.0000 mg | ORAL_TABLET | Freq: Every day | ORAL | Status: DC

## 2010-12-05 MED ORDER — CYCLOBENZAPRINE HCL 10 MG PO TABS: 10.0000 mg | ORAL_TABLET | Freq: Three times a day (TID) | ORAL | Status: AC | PRN

## 2010-12-05 MED ORDER — HYDROCODONE-ACETAMINOPHEN 5-500 MG PO TABS: 1.0000 | ORAL_TABLET | Freq: Four times a day (QID) | ORAL | Status: DC | PRN

## 2010-12-05 NOTE — Progress Notes (Signed)
  Subjective:    Ray Hall is a 50 y.o. male who presents for evaluation of low back pain. The patient has had recurrent self limited episodes of low back pain in the past and a previous herniated disc. Symptoms have been present for 6 days and are gradually worsening.  Onset was related to / precipitated by a twisting movement. The pain is located in the right lumbar area, across the lower back or radiating to right leg(s) and radiates to the knee. The pain is described as pinching, sharp and throbbing and occurs all day. He rates his pain as moderate. Symptoms are exacerbated by extension, flexion, sitting, standing and twisting. Symptoms are improved by nothing. He has also tried nothing which provided no symptom relief. He has tingling in the right leg associated with the back pain. The patient has no "red flag" history indicative of complicated back pain.  The following portions of the patient's history were reviewed and updated as appropriate: allergies, current medications and problem list.  Review of Systems Pertinent items are noted in HPI.    Objective:   Inspection and palpation: inspection of back is normal, paraspinal tenderness noted lumbar spine, antalgic gait. Muscle tone and ROM exam: muscle spasm noted right lumbar area, limited range of motion with pain, antalgic gait. Straight leg raise: equivocal at 45 degrees bilaterally. Neurological: normal DTRs, muscle strength and reflexes.    Assessment:    Nonspecific acute low back pain    Plan:    Natural history and expected course discussed. Questions answered. Regular aerobic and trunk strengthening exercises discussed. Short (2-4 day) period of relative rest recommended until acute symptoms improve. Heat to affected area as needed for local pain relief. NSAIDs per medication orders. Muscle relaxants per medication orders. Patient can return to regular work on 12/07/10. Follow-up in 1 week.

## 2010-12-05 NOTE — Patient Instructions (Signed)
I have sent in 10 days of Mobic (for pain) and flexiril (for muscle spasm)  Come back and see Ray Hall in 1 week to follow up  I am refilling your vicodin, you can take the other meds with this  Back Pain & Injury Your back pain is most likely caused by a strain of the muscles or ligaments supporting the spine. Back strains cause pain and trouble moving because of muscle spasms. They may take several weeks to heal. Usually they are better in days.   Treatment for back pain includes:  Rest - Get bed rest as needed over the next day or two. Use a firm mattress and lie on your side with your knees slightly bent. If you lie on your back, put a pillow under your knees.   Early movement - Back pain improves most rapidly if you remain active. It is much more stressful on the back to sit or stand in one place. Do not sit, drive or stand in one place for more than 30 minutes at a time. Take short walks on level surfaces as soon as pain allows.   Limit bending and lifting - Do not bend over or lift anything over 20 pounds until instructed otherwise. Lift by bending your knees. Use your leg muscles to help. Keep the load close to your body and avoid twisting. Do not reach or do overhead work.   Medicines - Medicine to reduce pain and inflammation are helpful. Muscle-relaxing drugs may be prescribed.   Therapy - Put ice packs on your back every few hours for the first 2-3 days after your injury or as instructed. After that ice or heat may be alternated to reduce pain and spasm. Back exercises and gentle massage may be of some benefit. You should be examined again if your back pain is not better in one week.  SEEK IMMEDIATE MEDICAL CARE IF:  You have pain that radiates from your back into your legs.   You develop new bowel or bladder control problems.   You have unusual weakness or numbness in your arms or legs.   You develop nausea or vomiting.   You develop abdominal pain.   You feel faint.    Document Released: 04/15/2005 Document Re-Released: 01/23/2008 [facility] Patient Information 2011 [place], [place].

## 2010-12-05 NOTE — Assessment & Plan Note (Signed)
No red flags.  Will give 10 days of mobic and flexiril (normal Cr at last check).  No pain contract found and new pain so will refer vicodin.  Advised NOT to drive with flexiril.  RTC in one week for recheck.

## 2010-12-11 ENCOUNTER — Ambulatory Visit: Payer: Self-pay | Admitting: Family Medicine

## 2010-12-14 ENCOUNTER — Encounter: Payer: Self-pay | Admitting: Family Medicine

## 2010-12-14 ENCOUNTER — Ambulatory Visit (INDEPENDENT_AMBULATORY_CARE_PROVIDER_SITE_OTHER): Payer: Self-pay | Admitting: Family Medicine

## 2010-12-14 VITALS — BP 144/98 | HR 76 | Wt 345.0 lb

## 2010-12-14 DIAGNOSIS — M549 Dorsalgia, unspecified: Secondary | ICD-10-CM

## 2010-12-14 DIAGNOSIS — M545 Low back pain: Secondary | ICD-10-CM

## 2010-12-14 DIAGNOSIS — E119 Type 2 diabetes mellitus without complications: Secondary | ICD-10-CM

## 2010-12-14 LAB — POCT GLYCOSYLATED HEMOGLOBIN (HGB A1C): Hemoglobin A1C: 7.5

## 2010-12-14 MED ORDER — OXYCODONE-ACETAMINOPHEN 5-325 MG PO TABS: 1.0000 | ORAL_TABLET | Freq: Four times a day (QID) | ORAL | Status: DC | PRN

## 2010-12-14 MED ORDER — LISINOPRIL 20 MG PO TABS: 20.0000 mg | ORAL_TABLET | Freq: Every day | ORAL | Status: DC

## 2010-12-14 MED ORDER — FUROSEMIDE 40 MG PO TABS: 40.0000 mg | ORAL_TABLET | Freq: Every day | ORAL | Status: DC

## 2010-12-14 NOTE — Assessment & Plan Note (Signed)
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  Reported that he will likely need to go back to hydrocodone.

## 2010-12-14 NOTE — Assessment & Plan Note (Signed)
Chronic, secondary to past knee injury and occupation of being a long distance trucker
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
## 2010-12-14 NOTE — Assessment & Plan Note (Signed)
Ablation, off Pradaxa, now on aspirin

## 2010-12-14 NOTE — Patient Instructions (Signed)
Begin the lisinopril Return in 2 months for apt with new doctor Drink a lot of water Soak feet, file and moisturize

## 2010-12-14 NOTE — Assessment & Plan Note (Signed)
A1C at 7.5, acceptable, would like to see him loose weight

## 2010-12-14 NOTE — Assessment & Plan Note (Signed)
Resume lisinopril, taken off during hypotensive crisis associated with acute blood loss and A fib

## 2010-12-26 ENCOUNTER — Encounter: Payer: Self-pay | Admitting: Family Medicine

## 2010-12-31 ENCOUNTER — Other Ambulatory Visit: Payer: Self-pay | Admitting: Family Medicine

## 2010-12-31 NOTE — Telephone Encounter (Signed)
Refill request

## 2011-01-18 ENCOUNTER — Other Ambulatory Visit: Payer: Self-pay | Admitting: Family Medicine

## 2011-01-18 NOTE — Telephone Encounter (Signed)
Refill request

## 2011-01-25 ENCOUNTER — Ambulatory Visit: Payer: Self-pay | Admitting: Family Medicine

## 2011-01-31 ENCOUNTER — Other Ambulatory Visit: Payer: Self-pay | Admitting: Family Medicine

## 2011-01-31 NOTE — Telephone Encounter (Signed)
Refill request

## 2011-02-04 LAB — CBC
Hemoglobin: 13.9
MCHC: 33.2
RBC: 5.22
WBC: 11.8 — ABNORMAL HIGH

## 2011-02-04 LAB — DIFFERENTIAL
Basophils Relative: 0
Lymphs Abs: 2.7
Monocytes Absolute: 0.8
Monocytes Relative: 7
Neutro Abs: 8.2 — ABNORMAL HIGH
Neutrophils Relative %: 69

## 2011-02-04 LAB — BASIC METABOLIC PANEL
CO2: 22
Calcium: 9.2
Chloride: 98
GFR calc Af Amer: >60
Sodium: 131 — ABNORMAL LOW

## 2011-02-07 LAB — URINALYSIS, ROUTINE W REFLEX MICROSCOPIC
Bilirubin Urine: NEGATIVE
Glucose, UA: 500 — AB
Hgb urine dipstick: NEGATIVE
Specific Gravity, Urine: 1.026
pH: 7

## 2011-02-15 ENCOUNTER — Telehealth: Payer: Self-pay | Admitting: Family Medicine

## 2011-02-15 NOTE — Telephone Encounter (Signed)
Will need an office visit for this controlled medication.

## 2011-02-15 NOTE — Telephone Encounter (Signed)
Ray Hall is calling because he needs a refill on his Percocet.  He was last seen by Dr. Hulen Luster on 8/8.  Please call when ready for pick up.

## 2011-03-01 ENCOUNTER — Encounter: Payer: Self-pay | Admitting: Family Medicine

## 2011-03-01 ENCOUNTER — Ambulatory Visit (INDEPENDENT_AMBULATORY_CARE_PROVIDER_SITE_OTHER): Payer: Self-pay | Admitting: Family Medicine

## 2011-03-01 VITALS — BP 144/91 | HR 77 | Temp 98.6°F | Ht 79.0 in | Wt 339.2 lb

## 2011-03-01 DIAGNOSIS — M549 Dorsalgia, unspecified: Secondary | ICD-10-CM

## 2011-03-01 DIAGNOSIS — N390 Urinary tract infection, site not specified: Secondary | ICD-10-CM | POA: Insufficient documentation

## 2011-03-01 DIAGNOSIS — M545 Low back pain: Secondary | ICD-10-CM

## 2011-03-01 LAB — POCT URINALYSIS DIPSTICK
Bilirubin, UA: NEGATIVE
Glucose, UA: 250
Ketones, UA: NEGATIVE
Leukocytes, UA: NEGATIVE
Protein, UA: NEGATIVE
Spec Grav, UA: 1.02

## 2011-03-01 LAB — POCT UA - MICROSCOPIC ONLY: WBC, Ur, HPF, POC: >20

## 2011-03-01 MED ORDER — HYDROCODONE-ACETAMINOPHEN 5-500 MG PO TABS: 1.0000 | ORAL_TABLET | Freq: Four times a day (QID) | ORAL | Status: DC | PRN

## 2011-03-01 MED ORDER — MELOXICAM 15 MG PO TABS: 15.0000 mg | ORAL_TABLET | Freq: Every day | ORAL | Status: DC

## 2011-03-01 MED ORDER — CIPROFLOXACIN HCL 500 MG PO TABS: 500.0000 mg | ORAL_TABLET | Freq: Two times a day (BID) | ORAL | Status: AC

## 2011-03-01 NOTE — Assessment & Plan Note (Signed)
No prostate tenderness. Will treat with 2 weeks of Cipro. Sent and urine culture.

## 2011-03-01 NOTE — Progress Notes (Signed)
  Subjective:    Patient ID: Ray Hall, male    DOB: 1960-06-26, 50 y.o.   MRN: [id number]  HPI Patient presents today for evaluation of back pain as well as changes in urination. Urination-patient notes that for the last 3 or 4 days he has had cloudy urine and he has had the sensation of pressure behind his testicles. He states that he still has good flow. He denies any burning when he urinates. he denies any discharge or new partners. Patient has a history of recurrent UTIs. He's been worked up at [facility].  Back pain-patient has a three-day history of worsening back pain. He states this is the last time he was seen, he was able to get better to his chronic back pain point. In the last 3 days he has had increased back spasm right greater than left. He is having trouble standing or sitting for long periods of time. He denies any injury. He is currently taking Flexeril for this pain. He also takes ibuprofen 800 mg 3 times a day. He denies any stomach problems. He notes that stretching helps temporarily but that relief does not last.   Review of Systems Denies CP, SOB, HA, N/V/D, fever     Objective:   Physical Exam GEN-uncomfortable appearing sitting straight up on the exam table Heart-irregular rate and rhythm Lungs-no wheeze or crackles MSK-there is an area of spasm and muscle on the right lower back. There is tenderness bilaterally in this area. Patient has full range of motion in the spine. Patient has normal strength. Straight leg raise is negative bilaterally. Patient is able to walk with a normal gait, and consume his heels. Rectal-prostate exam is normal and is not particularly tender.      Assessment & Plan:

## 2011-03-01 NOTE — Patient Instructions (Signed)
I am  sorry you were in so much pain today. I have written you for Vicodin 1 tablet per day for the worst pain of the day. I would like you to get recertified for Jaynee Eagles so that we can start physical therapy. Please call when you have a Jaynee Eagles card so that we can send an order for physical therapy for your back pain. I think that you have a UTI and I would like to treat you with ciprofloxacin twice a day for 14 days. If you have worsening urinary symptoms or develop fever we want to see you again. I would like to see you again at the end of November for a recheck on your back pain. We may eventually need to send you to see someone for your back pain, however we can't do that without your orange card.

## 2011-03-01 NOTE — Assessment & Plan Note (Signed)
Acute on chronic back pain. No red flags today no reason for imaging. Ray Hall manage this patient on hydrocodone. Asked patient to get he's [facility] Card needed. Would like him to use physical therapy to help him. Gave him Vicodin for the worst pain at the day. Will not increase the number of pills. Will see patient back in a few weeks. Once he is at his chronic pain baseline, would like to start a Effexor or Cymbalta. He may need to get involved with a map program at the health department.

## 2011-03-03 LAB — URINE CULTURE
Colony Count: NO GROWTH
Organism ID, Bacteria: NO GROWTH

## 2011-03-13 ENCOUNTER — Other Ambulatory Visit: Payer: Self-pay | Admitting: Family Medicine

## 2011-03-13 NOTE — Telephone Encounter (Signed)
Refill request

## 2011-03-29 ENCOUNTER — Ambulatory Visit: Payer: Self-pay | Admitting: Family Medicine

## 2011-05-03 ENCOUNTER — Ambulatory Visit (INDEPENDENT_AMBULATORY_CARE_PROVIDER_SITE_OTHER): Payer: Self-pay | Admitting: Family Medicine

## 2011-05-03 ENCOUNTER — Encounter: Payer: Self-pay | Admitting: Family Medicine

## 2011-05-03 VITALS — BP 150/97 | HR 89 | Ht 79.0 in | Wt 350.0 lb

## 2011-05-03 DIAGNOSIS — E119 Type 2 diabetes mellitus without complications: Secondary | ICD-10-CM

## 2011-05-03 DIAGNOSIS — M545 Low back pain: Secondary | ICD-10-CM

## 2011-05-03 DIAGNOSIS — L97509 Non-pressure chronic ulcer of other part of unspecified foot with unspecified severity: Secondary | ICD-10-CM

## 2011-05-03 DIAGNOSIS — E1169 Type 2 diabetes mellitus with other specified complication: Secondary | ICD-10-CM

## 2011-05-03 DIAGNOSIS — E11621 Type 2 diabetes mellitus with foot ulcer: Secondary | ICD-10-CM | POA: Insufficient documentation

## 2011-05-03 DIAGNOSIS — I10 Essential (primary) hypertension: Secondary | ICD-10-CM

## 2011-05-03 LAB — POCT GLYCOSYLATED HEMOGLOBIN (HGB A1C): Hemoglobin A1C: 8.5

## 2011-05-03 LAB — COMPREHENSIVE METABOLIC PANEL
ALT: 22 U/L (ref 0–53)
AST: 26 U/L (ref 0–37)
Albumin: 4 g/dL (ref 3.5–5.2)
Alkaline Phosphatase: 53 U/L (ref 39–117)
BUN: 10 mg/dL (ref 6–23)
Potassium: 4.1 mEq/L (ref 3.5–5.3)

## 2011-05-03 MED ORDER — HYDROCODONE-ACETAMINOPHEN 5-500 MG PO TABS: 1.0000 | ORAL_TABLET | Freq: Four times a day (QID) | ORAL | Status: DC | PRN

## 2011-05-03 NOTE — Assessment & Plan Note (Signed)
Gave Vicodin for nighttime dosing. Gave instructions to try Tylenol 3 times a day. Would rather patient not be on NSAIDs given his hypertension and diabetes.

## 2011-05-03 NOTE — Progress Notes (Signed)
  Subjective:    Patient ID: Ray Hall, male    DOB: 03-23-1961, 51 y.o.   MRN: [id number]  HPI Back pain-patient currently taking Motrin 800 mg 2 at bedtime and Mobic once daily. He is not taking the Vicodin anymore. He states that he has a lot of back pain all he is driving. He also has trouble sleeping at night because of the pain.  Diabetes-patient notes that he is really slipped up on his diet and exercise recently.he has been eating a lot of sugar and not exercising. He does take his medicines as prescribed. He does not check his sugar at home.  Foot ulcer-patient notes that one month ago he wore steel toed boots and developed a ulceration on his second toe on the right foot. Since that time he has not worn the steel toed boots, however it is not healing up and seems to be bleeding whenever he wears shoesP. He has not had an ulcer on the foot before.he denies any pus or redness around this lesion.  Review of Systems    denies fever, nausea, vomiting, diarrhea   Objective:   Physical Exam  Vital signs reviewed General appearance - alert, well appearing, and in no distress and oriented to person, place, and time Heart - normal rate, regular rhythm, normal S1, S2, no murmurs, rubs, clicks or gallops Chest - clear to auscultation, no wheezes, rales or rhonchi, symmetric air entry, no tachypnea, retractions or cyanosis Gait-Antalgic, walks with a small limp. Right foot-second toe with a ulcer 1 cm x 0.5 cm on the first knuckle.no surrounding redness, no edema, no pus draining. Dorsalis pedis pulses present bilaterally. 1+ edema bilateral legs.      Assessment & Plan:

## 2011-05-03 NOTE — Assessment & Plan Note (Signed)
Ulcer 1 cm x 0.5 cm on top of the first joint of second toe right foot. Gave a restored dressing to be changed once per week. See back in 3-4 weeks for progression. Gave red flags return earlier.

## 2011-05-03 NOTE — Patient Instructions (Signed)
Stop the mobic and the motrin Try vicodin 1 pill at night and tylenol extra strength 2 pills in AM, lunch and 1 pill at night Work on diet and exercise  For the toe Change the dressing every week Come see me in 3-4 weeks unless its getting worse.

## 2011-05-03 NOTE — Assessment & Plan Note (Signed)
Worsened, currently works control. Encourage diet and exercise.

## 2011-05-06 ENCOUNTER — Encounter: Payer: Self-pay | Admitting: Family Medicine

## 2011-05-27 ENCOUNTER — Ambulatory Visit (INDEPENDENT_AMBULATORY_CARE_PROVIDER_SITE_OTHER): Payer: Self-pay | Admitting: Family Medicine

## 2011-05-27 ENCOUNTER — Encounter: Payer: Self-pay | Admitting: Family Medicine

## 2011-05-27 DIAGNOSIS — R6889 Other general symptoms and signs: Secondary | ICD-10-CM

## 2011-05-27 DIAGNOSIS — L97509 Non-pressure chronic ulcer of other part of unspecified foot with unspecified severity: Secondary | ICD-10-CM

## 2011-05-27 DIAGNOSIS — E11621 Type 2 diabetes mellitus with foot ulcer: Secondary | ICD-10-CM

## 2011-05-27 DIAGNOSIS — I10 Essential (primary) hypertension: Secondary | ICD-10-CM

## 2011-05-27 DIAGNOSIS — E1169 Type 2 diabetes mellitus with other specified complication: Secondary | ICD-10-CM

## 2011-05-27 NOTE — Patient Instructions (Signed)
Come back for increased cough, new fevers.   Keep using restore on your toe.

## 2011-05-27 NOTE — Assessment & Plan Note (Signed)
Improved.  Continue restore.  See back in 2-4 weeks

## 2011-05-27 NOTE — Assessment & Plan Note (Signed)
Feeling better.  Gave red flags for return

## 2011-05-27 NOTE — Assessment & Plan Note (Signed)
Pt out of lisinopril x 2 weeks.  Recheck BP in 2 weeks.  Consider sending for Dr. Raymondo Band to do 24 monitor.

## 2011-05-27 NOTE — Progress Notes (Signed)
  Subjective:    Patient ID: ANGELITO HOPPING, male    DOB: [birth year], 51 y.o.   MRN: [id number]  HPI  Here for f/u of toe ulcer.   Using restore dressing on toe.  No drainage.  Not using steel toed boots.  Shoes do not rub.  HTN- no CP, SOB, HA.  Has not been taking his ACE I x 2 weeks.  Just restarted on Saturday.    Fever- pt with fever of 101 on Saturday and Sunday.  No fever today.  Cough since Saturday.  Body aches.  Feeling better now but still achy.    Review of Systems See above    Objective:   Physical Exam Vital signs reviewed General appearance - alert, well appearing, and in no distress and oriented to person, place, and time Heart - normal rate, regular rhythm, normal S1, S2, no murmurs, rubs, clicks or gallops Chest - clear to auscultation, no wheezes, rales or rhonchi, symmetric air entry, no tachypnea, retractions or cyanosis Foot- R 2nd toe with 3mm x 5mm toe ulcer, no surroounding erythema, no drainage.         Assessment & Plan:

## 2011-07-18 ENCOUNTER — Telehealth: Payer: Self-pay | Admitting: Family Medicine

## 2011-07-18 NOTE — Telephone Encounter (Signed)
Lab appointment scheduled for tomm., no future orders in chart. Will forward to PCP.

## 2011-07-18 NOTE — Telephone Encounter (Signed)
Wife called to schedule an appt for Ray Hall for him to have labwork done.  She said that she had gotten a message saying that we needed him to come in for labwork pertaining to his diabetes.

## 2011-07-19 ENCOUNTER — Other Ambulatory Visit: Payer: Self-pay

## 2011-07-19 ENCOUNTER — Telehealth: Payer: Self-pay | Admitting: Family Medicine

## 2011-07-19 DIAGNOSIS — E119 Type 2 diabetes mellitus without complications: Secondary | ICD-10-CM

## 2011-07-19 NOTE — Telephone Encounter (Signed)
Orders in the chart. Will need DM follow up at the beginning of april

## 2011-07-19 NOTE — Telephone Encounter (Signed)
Pt requesting refill on Vicodin?

## 2011-07-19 NOTE — Progress Notes (Signed)
CMP AND DIRECT LDL DONE TODAY Ray Hall

## 2011-07-20 LAB — COMPLETE METABOLIC PANEL WITH GFR
BUN: 9 mg/dL (ref 6–23)
CO2: 19 mEq/L (ref 19–32)
Calcium: 9.4 mg/dL (ref 8.4–10.5)
Chloride: 106 mEq/L (ref 96–112)
Creat: 0.83 mg/dL (ref 0.50–1.35)
GFR, Est African American: >89 mL/min

## 2011-07-20 LAB — LDL CHOLESTEROL, DIRECT: Direct LDL: 32 mg/dL

## 2011-07-21 NOTE — Telephone Encounter (Signed)
On 1/4 he was given a script with 3 refills.  Should be 4 months, so fill 1/4,2/4, 3/4, and 4/4.  Should have enough for month of April.  Needs appt for refills after that

## 2011-07-22 NOTE — Telephone Encounter (Signed)
Spoke with [employer].  Pt had one filled on 07/08/11 and also filled one on 06/28/11, but that Rx was from 03/01/11.  Did not mention this to pt as I called him first, but he states that he is out and hes not sure why but he was only given 30 this time.  Will forward to MD as this sounds suspicious. Rebecca Cairns, Maryjo Rochester

## 2011-07-23 ENCOUNTER — Encounter: Payer: Self-pay | Admitting: Family Medicine

## 2011-07-23 ENCOUNTER — Other Ambulatory Visit: Payer: Self-pay | Admitting: Family Medicine

## 2011-07-23 NOTE — Telephone Encounter (Signed)
i was unable to reach [facility] today ( they hung up on me) but i believe the earlier message is right per the computer.  I will research this tomorrow in clinic.  We can possibly pull up the pharmacy records for [place]>

## 2011-07-24 ENCOUNTER — Other Ambulatory Visit: Payer: Self-pay | Admitting: Family Medicine

## 2011-07-24 NOTE — Telephone Encounter (Signed)
Called to tell pt, he is upset.  States that he is in [place] right now and does not have an appt until 08/09/11.  Pt states that he is taking the Vicodin every 6 hours and that is not helping, also states that he was told to take tylenol with it.  Advised that Vicodin already has tylenol in it and ask if he was sure she said to take ibuprofen.  He was upset at this and "I pay attention to directions, she told me to take tylenol with it, can you have her call me"?  Advised I would ask MD to call.    Upon looking back in the chart, I see where she told him to take tylenol instead of Vicodin during the day, not with the Vicodin.  Will forward to MD to call pt back.

## 2011-07-24 NOTE — Telephone Encounter (Signed)
Spoke with patient about his pain management regimen. The bottle says to take Vicodin every 6 hours but my patient instructions to take one at night. He says he has been taking the Vicodin every 6 hours as well as BC powders. The [facility] allowed him to refill the medication every 2 weeks. He did not understand the instructions to take the Vicodin once a day. He does not think that'll work at all. He has been taking it every 6 hours. He is called a [facility] to see about seeing them. I offered another appointment to see him and discuss this. I also offered to refer him to pain management. I offered to refill his Vicodin for one a day. He says that he does not think I understand his pain and he would like to find another doctor. I offered to find him another doctor in our clinic. He says that he will try a different clinic. I asked him if he wanted someone from our office to call him and he said he did not. I said that he can call our office back if he wanted any further help or to find another doctor.

## 2011-07-24 NOTE — Telephone Encounter (Signed)
I spoke with the [facility] pharmacy tech today. This patient has been filling his prescription every 2 weeks instead of every month. He needs to come in and have an appointment to talk about how he is using his Vicodin.

## 2011-08-09 ENCOUNTER — Ambulatory Visit: Payer: Self-pay | Admitting: Family Medicine

## 2011-10-15 ENCOUNTER — Emergency Department (HOSPITAL_COMMUNITY): Payer: Self-pay

## 2011-10-15 ENCOUNTER — Encounter (HOSPITAL_COMMUNITY): Payer: Self-pay | Admitting: Emergency Medicine

## 2011-10-15 ENCOUNTER — Emergency Department (HOSPITAL_COMMUNITY)
Admission: EM | Admit: 2011-10-15 | Discharge: 2011-10-15 | Disposition: A | Discharge status: Home / Self Care | Payer: Self-pay | Attending: Emergency Medicine | Admitting: Emergency Medicine

## 2011-10-15 DIAGNOSIS — G500 Trigeminal neuralgia: Secondary | ICD-10-CM | POA: Insufficient documentation

## 2011-10-15 DIAGNOSIS — R319 Hematuria, unspecified: Secondary | ICD-10-CM | POA: Insufficient documentation

## 2011-10-15 DIAGNOSIS — Z79899 Other long term (current) drug therapy: Secondary | ICD-10-CM | POA: Insufficient documentation

## 2011-10-15 LAB — URINALYSIS, ROUTINE W REFLEX MICROSCOPIC
Glucose, UA: 250 mg/dL — AB
Glucose, UA: 100 mg/dL — AB
Ketones, ur: 15 mg/dL — AB
Nitrite: POSITIVE — AB
Specific Gravity, Urine: 1.016 (ref 1.005–1.030)
pH: 7 (ref 5.0–8.0)
pH: 6 (ref 5.0–8.0)

## 2011-10-15 LAB — COMPREHENSIVE METABOLIC PANEL
BUN: 12 mg/dL (ref 6–23)
CO2: 20 mEq/L (ref 19–32)
Chloride: 98 mEq/L (ref 96–112)
Creatinine, Ser: 0.97 mg/dL (ref 0.50–1.35)
GFR calc non Af Amer: >90 mL/min (ref >90)
Glucose, Bld: 300 mg/dL — ABNORMAL HIGH (ref 70–99)
Total Bilirubin: 0.4 mg/dL (ref 0.3–1.2)

## 2011-10-15 LAB — CBC
Platelets: 259 10*3/uL (ref 150–400)
RBC: 4.56 MIL/uL (ref 4.22–5.81)
RDW: 13.6 % (ref 11.5–15.5)
WBC: 10.2 10*3/uL (ref 4.0–10.5)

## 2011-10-15 LAB — URINE MICROSCOPIC-ADD ON

## 2011-10-15 LAB — DIFFERENTIAL
Basophils Absolute: 0 10*3/uL (ref 0.0–0.1)
Eosinophils Absolute: 0.3 10*3/uL (ref 0.0–0.7)
Lymphocytes Relative: 40 % (ref 12–46)
Lymphs Abs: 4.1 10*3/uL — ABNORMAL HIGH (ref 0.7–4.0)
Neutrophils Relative %: 53 % (ref 43–77)

## 2011-10-15 LAB — GLUCOSE, CAPILLARY: Glucose-Capillary: 255 mg/dL — ABNORMAL HIGH (ref 70–99)

## 2011-10-15 MED ORDER — DIPHENHYDRAMINE HCL 50 MG/ML IJ SOLN
25.0000 mg | Freq: Once | INTRAMUSCULAR | Status: AC
  Administered 2011-10-15: 17:00:00 via INTRAVENOUS
  Filled 2011-10-15: qty 1

## 2011-10-15 MED ORDER — CARBAMAZEPINE 200 MG PO TABS: 200.0000 mg | ORAL_TABLET | Freq: Two times a day (BID) | ORAL | Status: DC

## 2011-10-15 MED ORDER — SODIUM CHLORIDE 0.9 % IV BOLUS (SEPSIS)
1000.0000 mL | Freq: Once | INTRAVENOUS | Status: AC
  Administered 2011-10-15: 1000 mL via INTRAVENOUS

## 2011-10-15 MED ORDER — METOCLOPRAMIDE HCL 5 MG/ML IJ SOLN
10.0000 mg | Freq: Once | INTRAMUSCULAR | Status: AC
  Administered 2011-10-15: 10 mg via INTRAVENOUS
  Filled 2011-10-15: qty 2

## 2011-10-15 MED ORDER — CARBAMAZEPINE 200 MG PO TABS
200.0000 mg | ORAL_TABLET | Freq: Once | ORAL | Status: AC
  Administered 2011-10-15: 200 mg via ORAL
  Filled 2011-10-15: qty 1

## 2011-10-15 MED ORDER — DEXAMETHASONE SODIUM PHOSPHATE 10 MG/ML IJ SOLN
10.0000 mg | Freq: Once | INTRAMUSCULAR | Status: AC
  Administered 2011-10-15: 10 mg via INTRAVENOUS
  Filled 2011-10-15: qty 1

## 2011-10-15 NOTE — ED Provider Notes (Signed)
History     CSN: [id number]  Arrival date & time 10/15/11  1350   First MD Initiated Contact with Patient 10/15/11 1631      Chief Complaint  Patient presents with  . Headache  . Hematuria     Patient is a 51 y.o. male presenting with abdominal pain and hematuria. The history is provided by the patient and the spouse.  Abdominal Pain The primary symptoms of the illness include abdominal pain (suprapubic). The primary symptoms of the illness do not include fever, shortness of breath, nausea, vomiting or diarrhea. The current episode started 6 to 12 hours ago. The onset of the illness was gradual. The problem has not changed since onset. The patient has not had a change in bowel habit. Additional symptoms associated with the illness include hematuria and frequency. Symptoms associated with the illness do not include chills, diaphoresis, constipation or urgency.  Hematuria This is a new problem. The current episode started today. The problem is unchanged. He describes the hematuria as gross hematuria. The hematuria occurs throughout @his @ entire urinary stream.  He reports no clotting in his urine stream. He is experiencing no pain. He describes his urine color as dark red. Irritative symptoms include frequency. Irritative symptoms do not include urgency. Associated symptoms include abdominal pain (suprapubic). Pertinent negatives include no chills, fever, flank pain, nausea or vomiting. He is sexually active. There is no history of BPH or hypertension.    Past Medical History  Diagnosis Date  . Sickle cell trait   . Normal nuclear stress test 07/2010    low prob of ischemia, EF 42%  . Echocardiogram abnormal     moderate LVH, mild LV hypokenesis, EF 42%  . History of Doppler ultrasound 07/2010    negative for DVT  . Abnormal CT of the abdomen     mild L hydronephrosis and hydroureter  . Status post radiofrequency ablation for arrhythmia 10/16/10    [facility] Therisa Doyne MD)    Past  Surgical History  Procedure Date  . Radiofrequency ablation 10/16/10    Cardiac for atrial arrythmia, [facility]  . Dental extractions 10/15/10    prior to ablation    Family History  Problem Relation Age of Onset  . Sickle cell trait    . Heart failure Father   . Diabetes Father   . Diabetes Mother   . Hypertension Mother     History  Substance Use Topics  . Smoking status: Current Everyday Smoker    Types: Cigars  . Smokeless tobacco: Never Used   Comment: 2 cigars  . Alcohol Use: Not on file      Review of Systems  Constitutional: Negative for fever, chills, diaphoresis, activity change and appetite change.  HENT: Negative for neck pain.   Respiratory: Negative for cough, chest tightness, shortness of breath and wheezing.   Cardiovascular: Negative for chest pain, palpitations and leg swelling.  Gastrointestinal: Positive for abdominal pain (suprapubic). Negative for nausea, vomiting, diarrhea and constipation.  Genitourinary: Positive for frequency and hematuria. Negative for urgency, flank pain, decreased urine volume, discharge, penile swelling, scrotal swelling, difficulty urinating, penile pain and testicular pain.  Skin: Negative for rash and wound.  Neurological: Negative for dizziness, seizures, syncope, weakness, light-headedness, numbness and headaches.  All other systems reviewed and are negative.    Allergies  Review of patient's allergies indicates no known allergies.  Home Medications   Current Outpatient Rx  Name Route Sig Dispense Refill  . CYCLOBENZAPRINE HCL 10 MG PO  TABS Oral Take 10 mg by mouth 3 (three) times daily as needed. For muscle spasms.    Marland Kitchen DILTIAZEM HCL 60 MG PO TABS Oral Take 60 mg by mouth 2 (two) times daily.    . FUROSEMIDE 40 MG PO TABS Oral Take 1 tablet (40 mg total) by mouth daily. 30 tablet 6  . LISINOPRIL 20 MG PO TABS Oral Take 1 tablet (20 mg total) by mouth daily. 30 tablet 11  . METFORMIN HCL 1000 MG PO TABS Oral Take 1,000 mg  by mouth 2 (two) times daily with a meal.    . METOPROLOL TARTRATE 25 MG PO TABS Oral Take 1 tablet (25 mg total) by mouth 2 (two) times daily. 60 tablet 11    Started at hospital in [place] for Afib/flutter    BP 179/111  Pulse 102  Temp 98.3 F (36.8 C) (Oral)  Resp 18  SpO2 100%  Physical Exam  Nursing note and vitals reviewed. Constitutional: He appears well-developed and well-nourished.  HENT:  Head: Normocephalic and atraumatic.  Right Ear: External ear normal.  Left Ear: External ear normal.  Nose: Nose normal.  Mouth/Throat: Oropharynx is clear and moist. No oropharyngeal exudate.  Eyes: Conjunctivae are normal. Pupils are equal, round, and reactive to light.  Neck: Normal range of motion. Neck supple.  Cardiovascular: Normal rate, regular rhythm, normal heart sounds and intact distal pulses.   Pulmonary/Chest: Effort normal and breath sounds normal. No respiratory distress. He has no wheezes. He has no rales. He exhibits no tenderness.  Abdominal: Soft. Bowel sounds are normal. He exhibits no distension and no mass. There is tenderness (suprapubic). There is no rebound and no guarding.  Genitourinary: Prostate normal and penis normal. No penile tenderness.  Musculoskeletal: Normal range of motion. He exhibits no edema and no tenderness.  Neurological: He is alert. He displays normal reflexes. No cranial nerve deficit. He exhibits normal muscle tone. Coordination normal.  Skin: Skin is warm and dry. No rash noted. No erythema. No pallor.  Psychiatric: He has a normal mood and affect. His behavior is normal. Judgment and thought content normal.    ED Course  Procedures (including critical care time)  Labs Reviewed  URINALYSIS, ROUTINE W REFLEX MICROSCOPIC - Abnormal; Notable for the following:    Color, Urine RED (*)  BIOCHEMICALS MAY BE AFFECTED BY COLOR   APPearance TURBID (*)     Glucose, UA 250 (*)     Hgb urine dipstick LARGE (*)     Bilirubin Urine MODERATE (*)       Ketones, ur 15 (*)     Protein, ur >300 (*)     Nitrite POSITIVE (*)     Leukocytes, UA SMALL (*)     All other components within normal limits  COMPREHENSIVE METABOLIC PANEL - Abnormal; Notable for the following:    Glucose, Bld 300 (*)     All other components within normal limits  CBC - Abnormal; Notable for the following:    HCT 36.8 (*)     All other components within normal limits  DIFFERENTIAL - Abnormal; Notable for the following:    Lymphs Abs 4.1 (*)     All other components within normal limits  URINE MICROSCOPIC-ADD ON - Abnormal; Notable for the following:    Squamous Epithelial / LPF FEW (*)     All other components within normal limits  URINALYSIS, ROUTINE W REFLEX MICROSCOPIC - Abnormal; Notable for the following:    Color, Urine RED (*)  BIOCHEMICALS MAY BE AFFECTED BY COLOR   APPearance TURBID (*)     Glucose, UA 100 (*)     Hgb urine dipstick LARGE (*)     Bilirubin Urine MODERATE (*)     Ketones, ur 15 (*)     Protein, ur >300 (*)     Nitrite POSITIVE (*)     Leukocytes, UA MODERATE (*)     All other components within normal limits  GLUCOSE, CAPILLARY - Abnormal; Notable for the following:    Glucose-Capillary 255 (*)     All other components within normal limits  URINE MICROSCOPIC-ADD ON - Abnormal; Notable for the following:    Bacteria, UA FEW (*)     All other components within normal limits  PROTIME-INR  GRAM STAIN  URINE CULTURE   Ct Head Wo Contrast  10/15/2011  *RADIOLOGY REPORT*  Clinical Data: Persistent headache.  CT HEAD WITHOUT CONTRAST  Technique:  Contiguous axial images were obtained from the base of the skull through the vertex without contrast.  Comparison: 12/06/2003  Findings: There is no evidence of intracranial hemorrhage, brain edema or other signs of acute infarction.  There is no evidence of intracranial mass lesion or mass effect.  No abnormal extra-axial fluid collections are identified.  No evidence of hydrocephalus.   Persistent cavum septum pellucidum and cavum vergae again noted, which are normal variants.  No skull abnormality identified.  IMPRESSION: Negative noncontrast head CT.  Original Report Authenticated By: Danae Orleans, M.D.     1. Trigeminal neuralgia   2. Hematuria       MDM  51 yo F presents with 6 days of right-sided headache, waxing and waning, without focal si/sx. No focal neuro deficits. No hx of trauma to head. Clinical picture not concerning for [facility]. Head CT negative for evidence of intracranial mass. Will treat with migraine cocktail and IVF.   Pt also complains of gross hematuria, starting today. Pt has had hx of hematuria in the past after a traumatic foley catheterization; however, no recent trauma. Patient not tachycardic and without clinical signs of anemia. Physical exam unremarkable. Labs not concerning for anemia. Discussed case with Urologist on-call (Dr. Patsi Sears); although U/A has nitrites and ketones, it also has squamous cells and few WBC; after discussion will hold treatment until culture results. Patient given return precautions, including worsening of signs or symptoms. Patient instructed to follow-up with Urology tomorrow at 8:30 AM.         Clemetine Marker, MD 10/15/11 [phone]

## 2011-10-15 NOTE — Discharge Instructions (Signed)
Neuropathic Pain  We often think that pain has a physical cause. If we get rid of the cause, the pain should go away. Nerves themselves can also cause pain. It is called neuropathic pain, which means nerve abnormality. It may be difficult for the patients who have it and for the treating caregivers. Pain is usually described as acute (short-lived) or chronic (long-lasting). Acute pain is related to the physical sensations caused by an injury. It can last from a few seconds to many weeks, but it usually goes away when normal healing occurs. Chronic pain lasts beyond the typical healing time. With neuropathic pain, the nerve fibers themselves may be damaged or injured. They then send incorrect signals to other pain centers. The pain you feel is real, but the cause is not easy to find.   CAUSES   Chronic pain can result from diseases, such as diabetes and shingles (an infection related to chickenpox), or from trauma, surgery, or amputation. It can also happen without any known injury or disease. The nerves are sending pain messages, even though there is no identifiable cause for such messages.    Other common causes of neuropathy include diabetes, phantom limb pain, or Regional Pain Syndrome (RPS).   As with all forms of chronic back pain, if neuropathy is not correctly treated, there can be a number of associated problems that lead to a downward cycle for the patient. These include depression, sleeplessness, feelings of fear and anxiety, limited social interaction and inability to do normal daily activities or work.   The most dramatic and mysterious example of neuropathic pain is called "phantom limb syndrome." This occurs when an arm or a leg has been removed because of illness or injury. The brain still gets pain messages from the nerves that originally carried impulses from the missing limb. These nerves now seem to misfire and cause troubling pain.   Neuropathic pain often seems to have no cause. It responds  poorly to standard pain treatment.  Neuropathic pain can occur after:   Shingles (herpes zoster virus infection).   A lasting burning sensation of the skin, caused usually by injury to a peripheral nerve.   Peripheral neuropathy which is widespread nerve damage, often caused by diabetes or alcoholism.   Phantom limb pain following an amputation.   Facial nerve problems (trigeminal neuralgia).   Multiple sclerosis.   Reflex sympathetic dystrophy.   Pain which comes with cancer and cancer chemotherapy.   Entrapment neuropathy such as when pressure is put on a nerve such as in carpal tunnel syndrome.   Back, leg, and hip problems (sciatica).   Spine or back surgery.   HIV Infection or AIDS where nerves are infected by viruses.  Your caregiver can explain items in the above list which may apply to you.  SYMPTOMS   Characteristics of neuropathic pain are:   Severe, sharp, electric shock-like, shooting, lightening-like, knife-like.   Pins and needles sensation.   Deep burning, deep cold, or deep ache.   Persistent numbness, tingling, or weakness.   Pain resulting from light touch or other stimulus that would not usually cause pain.   Increased sensitivity to something that would normally cause pain, such as a pinprick.  Pain may persist for months or years following the healing of damaged tissues. When this happens, pain signals no longer sound an alarm about current injuries or injuries about to happen. Instead, the alarm system itself is not working correctly.   Neuropathic pain may get worse instead of   to serious disability. It is important to be aware that severe injury in a limb can occur without a proper, protective pain response.Burns, cuts, and other injuries may go unnoticed. Without proper treatment, these injuries can become infected or lead to further disability. Take any injury seriously, and consult your caregiver  for treatment. DIAGNOSIS  When you have a pain with no known cause, your caregiver will probably ask some specific questions:   Do you have any other conditions, such as diabetes, shingles, multiple sclerosis, or HIV infection?   How would you describe your pain? (Neuropathic pain is often described as shooting, stabbing, burning, or searing.)   Is your pain worse at any time of the day? (Neuropathic pain is usually worse at night.)   Does the pain seem to follow a certain physical pathway?   Does the pain come from an area that has missing or injured nerves? (An example would be phantom limb pain.)   Is the pain triggered by minor things such as rubbing against the sheets at night?  These questions often help define the type of pain involved. Once your caregiver knows what is happening, treatment can begin. Anticonvulsant, antidepressant drugs, and various pain relievers seem to work in some cases. If another condition, such as diabetes is involved, better management of that disorder may relieve the neuropathic pain.  TREATMENT  Neuropathic pain is frequently long-lasting and tends not to respond to treatment with narcotic type pain medication. It may respond well to other drugs such as antiseizure and antidepressant medications. Usually, neuropathic problems do not completely go away, but partial improvement is often possible with proper treatment. Your caregivers have large numbers of medications available to treat you. Do not be discouraged if you do not get immediate relief. Sometimes different medications or a combination of medications will be tried before you receive the results you are hoping for. See your caregiver if you have pain that seems to be coming from nowhere and does not go away. Help is available.  SEEK IMMEDIATE MEDICAL CARE IF:   There is a sudden change in the quality of your pain, especially if the change is on only one side of the body.   You notice changes of the  skin, such as redness, black or purple discoloration, swelling, or an ulcer.   You cannot move the affected limbs.  Document Released: 01/11/2004 Document Revised: 04/04/2011 Document Reviewed: 01/11/2004 [facility] Patient Information 2012 [place], [place].Trigeminal Neuralgia Trigeminal neuralgia is a nerve disorder that causes sudden attacks of severe facial pain. It is caused by damage to the trigeminal nerve, a major nerve in the face. It is more common in women and in the elderly, although it can also happen in younger patients. Attacks last from a few seconds to several minutes and can occur from a couple of times per year to several times per day. Trigeminal neuralgia can be a very distressing and disabling condition. Surgery may be needed in very severe cases if medical treatment does not give relief. HOME CARE INSTRUCTIONS   If your caregiver prescribed medication to help prevent attacks, take as directed.   To help prevent attacks:   Chew on the unaffected side of the mouth.   Avoid touching your face.   Avoid blasts of hot or cold air.   Men may wish to grow a beard to avoid having to shave.  SEEK IMMEDIATE MEDICAL CARE IF:  Pain is unbearable and your medicine does not help.   You develop new,  unexplained symptoms (problems).   You have problems that may be related to a medication you are taking.  Document Released: 04/12/2000 Document Revised: 04/04/2011 Document Reviewed: 02/10/2009 [facility] Patient Information 2012 [place], [place].

## 2011-10-15 NOTE — ED Notes (Signed)
Pt c/o HA on right side of head x 6 days and sts dark possible bloody urine starting today

## 2011-10-15 NOTE — ED Provider Notes (Signed)
Complains of right-sided headache gradual onset 6 days ago treated with Tylenol without relief headache is dull with shooting pains lasting a few seconds at a time, right-sided temporoparietal area, nonradiating no other associated symptoms. No nausea no photophobia patient also reports gross hematuria today with suprapubic pain no feeling of postvoid residual no other complaint on exam pleasant alert Glasgow Coma Score 15 HEENT exam no facial asymmetry neck supple trachea midline neurologic moves all extremities cranial nurse 2 through 12 intact no distress Glasgow Coma Score 15  Doug Sou, MD 10/15/11 [phone]

## 2011-10-15 NOTE — ED Notes (Signed)
Pt given fresh urinal and urine specimen cup; instructed on providing a clean catch.

## 2011-10-15 NOTE — ED Notes (Signed)
Pt ambulated with a steady gait; VSS; A&Ox3; no signs of distress; respirations even and unlabored; skin warm and dry; pt has no questions at this time.  

## 2011-10-16 NOTE — ED Provider Notes (Signed)
I have personally seen and examined the patient.  I have discussed the plan of care with the resident.  I have reviewed the documentation on PMH/FH/Soc. History.  I have reviewed the documentation of the resident and agree.  Doug Sou, MD 10/16/11 [phone]

## 2011-10-17 LAB — URINE CULTURE
Colony Count: NO GROWTH
Culture: NO GROWTH

## 2011-11-16 ENCOUNTER — Other Ambulatory Visit: Payer: Self-pay | Admitting: Family Medicine

## 2011-11-25 ENCOUNTER — Other Ambulatory Visit: Payer: Self-pay | Admitting: Family Medicine

## 2011-11-25 DIAGNOSIS — E119 Type 2 diabetes mellitus without complications: Secondary | ICD-10-CM

## 2011-11-25 MED ORDER — CARBAMAZEPINE 200 MG PO TABS: 200.0000 mg | ORAL_TABLET | Freq: Two times a day (BID) | ORAL | Status: DC

## 2011-11-25 MED ORDER — FUROSEMIDE 40 MG PO TABS: 40.0000 mg | ORAL_TABLET | Freq: Every day | ORAL | Status: DC

## 2011-12-16 ENCOUNTER — Other Ambulatory Visit: Payer: Self-pay | Admitting: Family Medicine

## 2012-01-14 ENCOUNTER — Other Ambulatory Visit: Payer: Self-pay | Admitting: Family Medicine

## 2012-01-14 ENCOUNTER — Emergency Department (HOSPITAL_COMMUNITY): Payer: Self-pay

## 2012-01-14 ENCOUNTER — Encounter (HOSPITAL_COMMUNITY): Payer: Self-pay

## 2012-01-14 ENCOUNTER — Inpatient Hospital Stay (HOSPITAL_COMMUNITY)
Admission: EM | Admit: 2012-01-14 | Discharge: 2012-01-22 | DRG: 638 | Disposition: A | Discharge status: Home / Self Care | Payer: MEDICAID | Attending: Family Medicine | Admitting: Family Medicine

## 2012-01-14 ENCOUNTER — Telehealth: Payer: Self-pay | Admitting: *Deleted

## 2012-01-14 DIAGNOSIS — E119 Type 2 diabetes mellitus without complications: Secondary | ICD-10-CM

## 2012-01-14 DIAGNOSIS — R079 Chest pain, unspecified: Secondary | ICD-10-CM | POA: Diagnosis present

## 2012-01-14 DIAGNOSIS — L03119 Cellulitis of unspecified part of limb: Secondary | ICD-10-CM

## 2012-01-14 DIAGNOSIS — E871 Hypo-osmolality and hyponatremia: Secondary | ICD-10-CM | POA: Diagnosis present

## 2012-01-14 DIAGNOSIS — N179 Acute kidney failure, unspecified: Secondary | ICD-10-CM | POA: Diagnosis present

## 2012-01-14 DIAGNOSIS — F17200 Nicotine dependence, unspecified, uncomplicated: Secondary | ICD-10-CM | POA: Diagnosis present

## 2012-01-14 DIAGNOSIS — I872 Venous insufficiency (chronic) (peripheral): Secondary | ICD-10-CM | POA: Diagnosis present

## 2012-01-14 DIAGNOSIS — D573 Sickle-cell trait: Secondary | ICD-10-CM | POA: Diagnosis present

## 2012-01-14 DIAGNOSIS — M79604 Pain in right leg: Secondary | ICD-10-CM

## 2012-01-14 DIAGNOSIS — G894 Chronic pain syndrome: Secondary | ICD-10-CM | POA: Diagnosis present

## 2012-01-14 DIAGNOSIS — I4891 Unspecified atrial fibrillation: Secondary | ICD-10-CM | POA: Diagnosis present

## 2012-01-14 DIAGNOSIS — I480 Paroxysmal atrial fibrillation: Secondary | ICD-10-CM

## 2012-01-14 DIAGNOSIS — I10 Essential (primary) hypertension: Secondary | ICD-10-CM | POA: Diagnosis present

## 2012-01-14 DIAGNOSIS — L02419 Cutaneous abscess of limb, unspecified: Secondary | ICD-10-CM | POA: Diagnosis present

## 2012-01-14 DIAGNOSIS — R509 Fever, unspecified: Secondary | ICD-10-CM

## 2012-01-14 DIAGNOSIS — Z833 Family history of diabetes mellitus: Secondary | ICD-10-CM

## 2012-01-14 DIAGNOSIS — Z87440 Personal history of urinary (tract) infections: Secondary | ICD-10-CM

## 2012-01-14 DIAGNOSIS — IMO0002 Reserved for concepts with insufficient information to code with codable children: Principal | ICD-10-CM | POA: Diagnosis present

## 2012-01-14 DIAGNOSIS — L0390 Cellulitis, unspecified: Secondary | ICD-10-CM

## 2012-01-14 DIAGNOSIS — E1169 Type 2 diabetes mellitus with other specified complication: Principal | ICD-10-CM | POA: Diagnosis present

## 2012-01-14 DIAGNOSIS — R609 Edema, unspecified: Secondary | ICD-10-CM

## 2012-01-14 HISTORY — DX: Essential (primary) hypertension: I10

## 2012-01-14 LAB — COMPREHENSIVE METABOLIC PANEL
ALT: 22 U/L (ref 0–53)
AST: 18 U/L (ref 0–37)
Albumin: 3.9 g/dL (ref 3.5–5.2)
CO2: 23 mEq/L (ref 19–32)
Calcium: 9.5 mg/dL (ref 8.4–10.5)
GFR calc non Af Amer: >90 mL/min (ref >90)
Sodium: 131 mEq/L — ABNORMAL LOW (ref 135–145)

## 2012-01-14 LAB — CBC WITH DIFFERENTIAL/PLATELET
Basophils Absolute: 0 10*3/uL (ref 0.0–0.1)
Basophils Relative: 0 % (ref 0–1)
Eosinophils Relative: 1 % (ref 0–5)
Lymphocytes Relative: 11 % — ABNORMAL LOW (ref 12–46)
MCHC: 35.8 g/dL (ref 30.0–36.0)
MCV: 81.1 fL (ref 78.0–100.0)
Neutro Abs: 15.8 10*3/uL — ABNORMAL HIGH (ref 1.7–7.7)
Platelets: 199 10*3/uL (ref 150–400)
RDW: 13.3 % (ref 11.5–15.5)
WBC: 19.4 10*3/uL — ABNORMAL HIGH (ref 4.0–10.5)

## 2012-01-14 LAB — GLUCOSE, CAPILLARY

## 2012-01-14 MED ORDER — ACETAMINOPHEN 325 MG PO TABS
650.0000 mg | ORAL_TABLET | Freq: Once | ORAL | Status: AC
  Administered 2012-01-14: 650 mg via ORAL
  Filled 2012-01-14: qty 2

## 2012-01-14 NOTE — Telephone Encounter (Signed)
De Nurse took a call from wife stating patient needs his BP medication today. He has been out since mid August.  An appointment was scheduled and message sent to MD. However wife states " this man needs his medicine today he is sitting over here shaking and cold. ". RN called back to home number listed and  was actually patient's cell phone number. He states he has been incoherent off and on since last night, started with numbness on right side this AM. States he is shaking and cold.  Advised patient to call 911 now or have someone take him to hospital immediately. He voices understanding. Wife is not with him currently.   I called  and spoke with wife on another number listed and told her that I had advised patient to get to ED now.

## 2012-01-14 NOTE — Telephone Encounter (Signed)
Will send message to Dr.  Konrad Dolores regarding refilling medication.

## 2012-01-14 NOTE — ED Notes (Signed)
Pt reports waking 0700 this am w/(L) leg pain and swelling, gradual onset of chills, fever, and all over body weakness. Pt reports productive cough w/white colored mucus and nasal congestion x2 weeks. Pt denies taking OTC medication

## 2012-01-14 NOTE — Telephone Encounter (Signed)
Pt has an appt on 9/27 - needs enough until appt

## 2012-01-15 ENCOUNTER — Inpatient Hospital Stay (HOSPITAL_COMMUNITY): Payer: Self-pay

## 2012-01-15 DIAGNOSIS — M79604 Pain in right leg: Secondary | ICD-10-CM

## 2012-01-15 DIAGNOSIS — M79609 Pain in unspecified limb: Secondary | ICD-10-CM

## 2012-01-15 DIAGNOSIS — R609 Edema, unspecified: Secondary | ICD-10-CM

## 2012-01-15 DIAGNOSIS — R509 Fever, unspecified: Secondary | ICD-10-CM

## 2012-01-15 LAB — GLUCOSE, CAPILLARY
Glucose-Capillary: 385 mg/dL — ABNORMAL HIGH (ref 70–99)
Glucose-Capillary: 334 mg/dL — ABNORMAL HIGH (ref 70–99)
Glucose-Capillary: 216 mg/dL — ABNORMAL HIGH (ref 70–99)

## 2012-01-15 LAB — URINE MICROSCOPIC-ADD ON

## 2012-01-15 LAB — URINALYSIS, ROUTINE W REFLEX MICROSCOPIC
Nitrite: NEGATIVE
Specific Gravity, Urine: 1.028 (ref 1.005–1.030)
Urobilinogen, UA: 1 mg/dL (ref 0.0–1.0)

## 2012-01-15 LAB — BASIC METABOLIC PANEL
BUN: 16 mg/dL (ref 6–23)
Calcium: 8.1 mg/dL — ABNORMAL LOW (ref 8.4–10.5)
GFR calc non Af Amer: 83 mL/min — ABNORMAL LOW (ref >90)
Glucose, Bld: 395 mg/dL — ABNORMAL HIGH (ref 70–99)
Sodium: 131 mEq/L — ABNORMAL LOW (ref 135–145)

## 2012-01-15 LAB — TROPONIN I: Troponin I: <0.3 ng/mL (ref <0.30)

## 2012-01-15 LAB — CBC
MCH: 28.1 pg (ref 26.0–34.0)
MCHC: 35 g/dL (ref 30.0–36.0)
Platelets: 154 10*3/uL (ref 150–400)
RBC: 3.98 MIL/uL — ABNORMAL LOW (ref 4.22–5.81)

## 2012-01-15 LAB — HEMOGLOBIN A1C: Mean Plasma Glucose: 169 mg/dL — ABNORMAL HIGH (ref <117)

## 2012-01-15 LAB — PROTIME-INR: Prothrombin Time: 14.2 seconds (ref 11.6–15.2)

## 2012-01-15 MED ORDER — LISINOPRIL 10 MG PO TABS: 10.0000 mg | ORAL_TABLET | Freq: Every day | ORAL | Status: DC

## 2012-01-15 MED ORDER — ENOXAPARIN SODIUM 150 MG/ML ~~LOC~~ SOLN
1.0000 mg/kg | Freq: Two times a day (BID) | SUBCUTANEOUS | Status: DC
  Administered 2012-01-15 – 2012-01-16 (×3): 145 mg via SUBCUTANEOUS
  Filled 2012-01-15 (×5): qty 1

## 2012-01-15 MED ORDER — ONDANSETRON HCL 4 MG PO TABS: 4.0000 mg | ORAL_TABLET | Freq: Four times a day (QID) | ORAL | Status: DC | PRN

## 2012-01-15 MED ORDER — DOCUSATE SODIUM 100 MG PO CAPS
100.0000 mg | ORAL_CAPSULE | Freq: Two times a day (BID) | ORAL | Status: DC
  Administered 2012-01-15 – 2012-01-22 (×13): 100 mg via ORAL
  Filled 2012-01-15 (×14): qty 1

## 2012-01-15 MED ORDER — INSULIN ASPART 100 UNIT/ML ~~LOC~~ SOLN
0.0000 [IU] | Freq: Every day | SUBCUTANEOUS | Status: DC
  Administered 2012-01-15: 2 [IU] via SUBCUTANEOUS
  Administered 2012-01-16: 4 [IU] via SUBCUTANEOUS
  Administered 2012-01-17: 3 [IU] via SUBCUTANEOUS
  Administered 2012-01-20: 2 [IU] via SUBCUTANEOUS

## 2012-01-15 MED ORDER — INSULIN GLARGINE 100 UNIT/ML ~~LOC~~ SOLN
10.0000 [IU] | Freq: Every day | SUBCUTANEOUS | Status: DC
  Administered 2012-01-15 – 2012-01-16 (×2): 10 [IU] via SUBCUTANEOUS

## 2012-01-15 MED ORDER — DILTIAZEM HCL ER 60 MG PO CP12
60.0000 mg | ORAL_CAPSULE | Freq: Two times a day (BID) | ORAL | Status: DC
Start: 2012-01-15 — End: 2012-01-22
  Administered 2012-01-16 – 2012-01-22 (×13): 60 mg via ORAL
  Filled 2012-01-15 (×18): qty 1

## 2012-01-15 MED ORDER — ACETAMINOPHEN 325 MG PO TABS
650.0000 mg | ORAL_TABLET | Freq: Four times a day (QID) | ORAL | Status: DC | PRN
  Administered 2012-01-20: 650 mg via ORAL
  Filled 2012-01-15: qty 2

## 2012-01-15 MED ORDER — SODIUM CHLORIDE 0.9 % IV SOLN
INTRAVENOUS | Status: DC
  Administered 2012-01-15 (×2): via INTRAVENOUS
  Administered 2012-01-17: 100 mL/h via INTRAVENOUS

## 2012-01-15 MED ORDER — VANCOMYCIN HCL 1000 MG IV SOLR
1500.0000 mg | INTRAVENOUS | Status: AC
  Administered 2012-01-15: 1500 mg via INTRAVENOUS
  Filled 2012-01-15: qty 1500

## 2012-01-15 MED ORDER — PIPERACILLIN-TAZOBACTAM 3.375 G IVPB
3.3750 g | Freq: Once | INTRAVENOUS | Status: AC
  Administered 2012-01-15: 3.375 g via INTRAVENOUS
  Filled 2012-01-15: qty 50

## 2012-01-15 MED ORDER — KETOROLAC TROMETHAMINE 30 MG/ML IJ SOLN: 30.0000 mg | Freq: Four times a day (QID) | INTRAMUSCULAR | Status: DC

## 2012-01-15 MED ORDER — ONDANSETRON HCL 4 MG/2ML IJ SOLN
4.0000 mg | Freq: Four times a day (QID) | INTRAMUSCULAR | Status: DC | PRN
  Administered 2012-01-15: 4 mg via INTRAVENOUS
  Filled 2012-01-15: qty 2

## 2012-01-15 MED ORDER — ACETAMINOPHEN 650 MG RE SUPP: 650.0000 mg | Freq: Four times a day (QID) | RECTAL | Status: DC | PRN

## 2012-01-15 MED ORDER — ZOLPIDEM TARTRATE 5 MG PO TABS
5.0000 mg | ORAL_TABLET | Freq: Every evening | ORAL | Status: DC | PRN
  Administered 2012-01-21: 5 mg via ORAL
  Filled 2012-01-15: qty 1

## 2012-01-15 MED ORDER — VANCOMYCIN HCL IN DEXTROSE 1-5 GM/200ML-% IV SOLN
1000.0000 mg | Freq: Once | INTRAVENOUS | Status: AC
  Administered 2012-01-15: 1000 mg via INTRAVENOUS
  Filled 2012-01-15: qty 200

## 2012-01-15 MED ORDER — INSULIN ASPART 100 UNIT/ML ~~LOC~~ SOLN
0.0000 [IU] | Freq: Three times a day (TID) | SUBCUTANEOUS | Status: DC
  Administered 2012-01-15: 9 [IU] via SUBCUTANEOUS

## 2012-01-15 MED ORDER — METFORMIN HCL 500 MG PO TABS
1000.0000 mg | ORAL_TABLET | Freq: Two times a day (BID) | ORAL | Status: DC
  Filled 2012-01-15: qty 2

## 2012-01-15 MED ORDER — INSULIN ASPART 100 UNIT/ML ~~LOC~~ SOLN
4.0000 [IU] | Freq: Three times a day (TID) | SUBCUTANEOUS | Status: DC
  Administered 2012-01-15 – 2012-01-22 (×21): 4 [IU] via SUBCUTANEOUS

## 2012-01-15 MED ORDER — INSULIN ASPART 100 UNIT/ML ~~LOC~~ SOLN
0.0000 [IU] | Freq: Three times a day (TID) | SUBCUTANEOUS | Status: DC
  Administered 2012-01-15: 8 [IU] via SUBCUTANEOUS
  Administered 2012-01-15: 11 [IU] via SUBCUTANEOUS
  Administered 2012-01-16 (×2): 5 [IU] via SUBCUTANEOUS
  Administered 2012-01-16: 3 [IU] via SUBCUTANEOUS
  Administered 2012-01-17 (×2): 8 [IU] via SUBCUTANEOUS
  Administered 2012-01-17 – 2012-01-18 (×2): 3 [IU] via SUBCUTANEOUS
  Administered 2012-01-18 (×2): 5 [IU] via SUBCUTANEOUS
  Administered 2012-01-19: 2 [IU] via SUBCUTANEOUS
  Administered 2012-01-19 (×2): 3 [IU] via SUBCUTANEOUS
  Administered 2012-01-20 – 2012-01-21 (×3): 2 [IU] via SUBCUTANEOUS
  Administered 2012-01-21 – 2012-01-22 (×2): 3 [IU] via SUBCUTANEOUS
  Administered 2012-01-22: 5 [IU] via SUBCUTANEOUS

## 2012-01-15 MED ORDER — MORPHINE SULFATE 4 MG/ML IJ SOLN
4.0000 mg | Freq: Once | INTRAMUSCULAR | Status: AC
  Administered 2012-01-15: 4 mg via INTRAVENOUS
  Filled 2012-01-15: qty 1

## 2012-01-15 MED ORDER — KETOROLAC TROMETHAMINE 30 MG/ML IJ SOLN
30.0000 mg | Freq: Four times a day (QID) | INTRAMUSCULAR | Status: AC | PRN
  Administered 2012-01-15 – 2012-01-16 (×3): 30 mg via INTRAVENOUS
  Filled 2012-01-15 (×3): qty 1

## 2012-01-15 MED ORDER — METFORMIN HCL 500 MG PO TABS
1000.0000 mg | ORAL_TABLET | Freq: Two times a day (BID) | ORAL | Status: DC
  Administered 2012-01-15: 1000 mg via ORAL
  Filled 2012-01-15 (×3): qty 2

## 2012-01-15 MED ORDER — LISINOPRIL 10 MG PO TABS
10.0000 mg | ORAL_TABLET | Freq: Every day | ORAL | Status: DC
  Administered 2012-01-15 – 2012-01-22 (×8): 10 mg via ORAL
  Filled 2012-01-15 (×8): qty 1

## 2012-01-15 MED ORDER — VANCOMYCIN HCL 1000 MG IV SOLR
1500.0000 mg | Freq: Two times a day (BID) | INTRAVENOUS | Status: DC
  Administered 2012-01-15 – 2012-01-22 (×14): 1500 mg via INTRAVENOUS
  Filled 2012-01-15 (×16): qty 1500

## 2012-01-15 MED ORDER — ASPIRIN EC 81 MG PO TBEC
81.0000 mg | DELAYED_RELEASE_TABLET | Freq: Every day | ORAL | Status: DC
  Administered 2012-01-15 – 2012-01-22 (×8): 81 mg via ORAL
  Filled 2012-01-15 (×8): qty 1

## 2012-01-15 MED ORDER — SODIUM CHLORIDE 0.9 % IV BOLUS (SEPSIS)
1000.0000 mL | Freq: Once | INTRAVENOUS | Status: AC
  Administered 2012-01-15: 1000 mL via INTRAVENOUS

## 2012-01-15 MED ORDER — PIPERACILLIN-TAZOBACTAM 3.375 G IVPB
3.3750 g | Freq: Three times a day (TID) | INTRAVENOUS | Status: DC
  Administered 2012-01-15 – 2012-01-19 (×13): 3.375 g via INTRAVENOUS
  Filled 2012-01-15 (×14): qty 50

## 2012-01-15 NOTE — Progress Notes (Signed)
ANTIBIOTIC CONSULT NOTE - INITIAL  Pharmacy Consult for Vancocin/Zosyn Indication: cellulitis  No Known Allergies  Patient Measurements: Height: 6\' 8"  (203.2 cm) Weight: 325 lb (147.419 kg) IBW/kg (Calculated) : 96   Vital Signs: Temp: 98.7 F (37.1 C) (09/18 0213) Temp src: Oral (09/18 0213) BP: 118/71 mmHg (09/18 0213) Pulse Rate: 97  (09/18 0213)  Labs:  Basename 01/14/12 1716  WBC 19.4*  HGB 13.5  PLT 199  LABCREA --  CREATININE 0.79   Estimated Creatinine Clearance: 186.2 ml/min (by C-G formula based on Cr of 0.79).  Microbiology: No results found for this or any previous visit (from the past 720 hour(s)).  Medical History: Past Medical History  Diagnosis Date  . Sickle cell trait   . Normal nuclear stress test 07/2010    low prob of ischemia, EF 42%  . Echocardiogram abnormal     moderate LVH, mild LV hypokenesis, EF 42%  . History of Doppler ultrasound 07/2010    negative for DVT  . Abnormal CT of the abdomen     mild L hydronephrosis and hydroureter  . 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 Diabetes mellitus   . Hypertension     Medications:  Prescriptions prior to admission  Medication Sig Dispense Refill  . cyclobenzaprine (FLEXERIL) 10 MG tablet Take 10 mg by mouth 3 (three) times daily as needed. For muscle spasms.      Marland Kitchen diltiazem (CARDIZEM) 60 MG tablet Take 60 mg by mouth 2 (two) times daily.      . furosemide (LASIX) 40 MG tablet Take 1 tablet (40 mg total) by mouth daily.  30 tablet  0  . glipiZIDE (GLUCOTROL) 10 MG tablet Take 10 mg by mouth 2 (two) times daily before a meal.      . lisinopril (PRINIVIL,ZESTRIL) 20 MG tablet Take 20 mg by mouth daily.      . metFORMIN (GLUCOPHAGE) 1000 MG tablet Take 1,000 mg by mouth 2 (two) times daily with a meal.      . Multiple Vitamin (MULTIVITAMIN WITH MINERALS) TABS Take 1 tablet by mouth daily.      Marland Kitchen lisinopril (PRINIVIL,ZESTRIL) 20 MG tablet Take 1  tablet (20 mg total) by mouth daily.  30 tablet  11   Scheduled:    . acetaminophen  650 mg Oral Once  . aspirin EC  81 mg Oral Daily  . diltiazem  60 mg Oral Q12H  . docusate sodium  100 mg Oral BID  . enoxaparin (LOVENOX) injection  1 mg/kg Subcutaneous Q12H  . insulin aspart  0-9 Units Subcutaneous TID WC  . lisinopril  10 mg Oral Daily  . metFORMIN  1,000 mg Oral BID WC  . morphine  4 mg Intravenous Once  . piperacillin-tazobactam (ZOSYN)  IV  3.375 g Intravenous Once  . piperacillin-tazobactam (ZOSYN)  IV  3.375 g Intravenous Q8H  . sodium chloride  1,000 mL Intravenous Once  . vancomycin  1,500 mg Intravenous NOW  . vancomycin  1,500 mg Intravenous Q12H  . vancomycin  1,000 mg Intravenous Once  . DISCONTD: ketorolac  30 mg Intravenous Q6H  . DISCONTD: lisinopril  10 mg Oral Daily  . DISCONTD: metFORMIN  1,000 mg Oral BID WC    Assessment: 51yo male c/o RLE pain, swelling, and redness that worsened overnight until developed fever, chills, and nausea, to begin IV ABX for possible cellulitis.  Goal of Therapy:  Vancomycin trough level 10-15 mcg/ml  Plan:  Rec'd vanc 1g and Zosyn 3.375g IV in ED; will give additional vancomycin 1500mg  for total load of 2500mg  then start 1500mg  IV Q12H as well as Zosyn 3.375g IV Q8H and monitor CBC, Cx, levels prn.  Colleen Can PharmD BCPS 01/15/2012,5:07 AM

## 2012-01-15 NOTE — Telephone Encounter (Signed)
Chart reviewed in am on 9/18. Pt no admitted. Will see in hospital

## 2012-01-15 NOTE — ED Provider Notes (Signed)
History     CSN: [id number]  Arrival date & time 01/14/12  1657   First MD Initiated Contact with Patient 01/15/12 0019      Chief Complaint  Patient presents with  . Leg Pain    (Consider location/radiation/quality/duration/timing/severity/associated sxs/prior treatment) HPI Comments: Ray Hall presents for evaluation of leg pain.  He states he has a hx of type II dm but no hx of thromboembolic events or skin infections.  He reports that since 0700 this morning (9/17), he has developed progressively worsening rt leg swelling, redness, and pain.  It began on the medial aspect of his knee and has expanded.  He denies CP or SOB.  He reports that he feels ill and is having pain.  He also reports that he has had nasal congestion, rhinorrhea, and a mildly productive cough for the last 1.5 weeks.  Patient is a 51 y.o. male presenting with leg pain. The history is provided by the patient. No language interpreter was used.  Leg Pain  The incident occurred 12 to 24 hours ago. The incident occurred at home. There was no injury mechanism. The pain is present in the right thigh, right leg and right knee. The pain is at a severity of 7/10. The pain is moderate. The pain has been constant since onset. He reports no foreign bodies present. The symptoms are aggravated by activity, bearing weight and palpation. He has tried nothing for the symptoms.    Past Medical History  Diagnosis Date  . Sickle cell trait   . Normal nuclear stress test 07/2010    low prob of ischemia, EF 42%  . Echocardiogram abnormal     moderate LVH, mild LV hypokenesis, EF 42%  . History of Doppler ultrasound 07/2010    negative for DVT  . Abnormal CT of the abdomen     mild L hydronephrosis and hydroureter  . 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 Diabetes mellitus   . Hypertension     Past Surgical History  Procedure Date  . Radiofrequency ablation 10/16/10    Cardiac  for atrial arrythmia, [facility]  . Dental extractions 10/15/10    prior to ablation    Family History  Problem Relation Age of Onset  . Sickle cell trait    . Heart failure Father   . Diabetes Father   . Diabetes Mother   . Hypertension Mother     History  Substance Use Topics  . Smoking status: Current Every Day Smoker    Types: Cigars  . Smokeless tobacco: Never Used   Comment: 2 cigars  . Alcohol Use: No      Review of Systems  Constitutional: Positive for fever, chills and fatigue. Negative for diaphoresis, activity change and appetite change.  HENT: Positive for congestion and postnasal drip. Negative for nosebleeds, sore throat, facial swelling, trouble swallowing, neck pain and sinus pressure.   Eyes: Negative.   Respiratory: Positive for cough. Negative for apnea, chest tightness, shortness of breath and wheezing.   Cardiovascular: Positive for leg swelling. Negative for chest pain and palpitations.  Gastrointestinal: Negative for nausea, vomiting, diarrhea and abdominal distention.  Genitourinary: Negative for dysuria, urgency and difficulty urinating.  Musculoskeletal: Positive for myalgias, joint swelling and arthralgias. Negative for back pain and gait problem.  Skin: Positive for color change and rash. Negative for pallor and wound.  Neurological: Positive for weakness. Negative for dizziness, syncope, light-headedness and headaches.  Hematological: Negative.  Psychiatric/Behavioral: Negative.     Allergies  Review of patient's allergies indicates no known allergies.  Home Medications   Current Outpatient Rx  Name Route Sig Dispense Refill  . CYCLOBENZAPRINE HCL 10 MG PO TABS Oral Take 10 mg by mouth 3 (three) times daily as needed. For muscle spasms.    Marland Kitchen DILTIAZEM HCL 60 MG PO TABS Oral Take 60 mg by mouth 2 (two) times daily.    . FUROSEMIDE 40 MG PO TABS Oral Take 1 tablet (40 mg total) by mouth daily. 30 tablet 0    Needs appointment before any further  refills  . GLIPIZIDE 10 MG PO TABS Oral Take 10 mg by mouth 2 (two) times daily before a meal.    . LISINOPRIL 20 MG PO TABS Oral Take 20 mg by mouth daily.    Marland Kitchen METFORMIN HCL 1000 MG PO TABS Oral Take 1,000 mg by mouth 2 (two) times daily with a meal.    . ADULT MULTIVITAMIN W/MINERALS CH Oral Take 1 tablet by mouth daily.    Marland Kitchen LISINOPRIL 20 MG PO TABS Oral Take 1 tablet (20 mg total) by mouth daily. 30 tablet 11    BP 153/119  Pulse 115  Temp 99 F (37.2 C) (Oral)  Resp 18  SpO2 100%  Physical Exam  Nursing note and vitals reviewed. Constitutional: He is oriented to person, place, and time. He appears well-developed and well-nourished. He is cooperative. He appears ill. No distress.  HENT:  Head: Normocephalic and atraumatic.  Right Ear: External ear normal.  Left Ear: External ear normal.  Nose: Nose normal.  Mouth/Throat: Oropharynx is clear and moist. No oropharyngeal exudate.  Eyes: Conjunctivae normal are normal. Pupils are equal, round, and reactive to light. Right eye exhibits no discharge. Left eye exhibits no discharge. No scleral icterus.  Neck: Normal range of motion. Neck supple. No JVD present. No tracheal deviation present.  Cardiovascular: Regular rhythm, S1 normal, S2 normal, normal heart sounds and intact distal pulses.   No extrasystoles are present. Tachycardia present.  PMI is not displaced.  Exam reveals no gallop, no distant heart sounds and no friction rub.   No murmur heard. Pulmonary/Chest: Effort normal and breath sounds normal. No stridor. No respiratory distress. He has no wheezes. He has no rales. He exhibits no tenderness.  Abdominal: Soft. Bowel sounds are normal. He exhibits no distension and no mass. There is no tenderness. There is no rebound and no guarding.  Musculoskeletal: Normal range of motion. He exhibits edema and tenderness.       Right ankle: He exhibits normal pulse.       Left ankle: He exhibits normal pulse.       Right leg has  swelling from distal thigh through foot + 1+ edema.  There is erythema on the medial aspect of the right distal thigh and knee.  No specific calf tenderness.  No erythema on the foot or open wounds.  There is a lesser amt of edema in the left leg (pt states it is chronic since ORIF performed some time ago).  Lymphadenopathy:    He has no cervical adenopathy.  Neurological: He is alert and oriented to person, place, and time. No cranial nerve deficit.  Skin: Skin is warm and dry. He is not diaphoretic. There is erythema. No pallor.          Erythema as marked.  Psychiatric: He has a normal mood and affect. His behavior is normal.    ED  Course  Procedures (including critical care time)  Labs Reviewed  CBC WITH DIFFERENTIAL - Abnormal; Notable for the following:    WBC 19.4 (*)     HCT 37.7 (*)     Neutrophils Relative 82 (*)     Neutro Abs 15.8 (*)     Lymphocytes Relative 11 (*)     Monocytes Absolute 1.3 (*)     All other components within normal limits  COMPREHENSIVE METABOLIC PANEL - Abnormal; Notable for the following:    Sodium 131 (*)     Chloride 94 (*)     Glucose, Bld 259 (*)     All other components within normal limits  GLUCOSE, CAPILLARY - Abnormal; Notable for the following:    Glucose-Capillary 265 (*)     All other components within normal limits  CULTURE, BLOOD (ROUTINE X 2)  CULTURE, BLOOD (ROUTINE X 2)  PROTIME-INR   Dg Chest 2 View  01/14/2012  *RADIOLOGY REPORT*  Clinical Data: Shortness of breath.  Right arm numbness. Hypertension.  Cough.  CHEST - 2 VIEW  Comparison: 07/30/2010  Findings: Mild thoracic spondylosis noted.  Cardiac and mediastinal contours appear normal.  The lungs appear clear.  No pleural effusion is identified.  IMPRESSION:  1.  Mild thoracic spondylosis.   Otherwise, no significant abnormality identified.   Original Report Authenticated By: Dellia Cloud, M.D.      No diagnosis found.    MDM  Pt presents for evaluation of  right leg pain and swelling.  Since arriving to the emergency department he has also developed fever.  Pt appears ill with, tachycardia, significant right leg swelling starting at the distal thigh, down to the foot, and erythema on the posterior and medial surfaces of the leg around the knee.  There is no knee pain.    Note leukocytosis (WBC 19.4).  Also mild hyponatremia and hyperglycemia.  No evidence of PNA on CXR.    Ordered IVF, blood cultures, vancomycin, and zosyn.  Coags also ordered.  Cannot exclude DVT.  Doppler of leg is not available to me  Tonight.  Will treat empirically for DVT also.  Discussed with the On-call resident for the family practice service.  Plan admission for further evaluation and treatment.  Currently he does not appear septic but he does meet SIRS criteria.      Tobin Chad, MD 01/15/12 [phone]

## 2012-01-15 NOTE — Progress Notes (Signed)
Right:  No obvious evidence of DVT, superficial thrombosis, or Baker's cyst.  Left:  Negative for DVT in the common femoral vein.  Technically difficult study due to the patient's body habitus and guarding secondary to the pain.

## 2012-01-15 NOTE — H&P (Signed)
Family Medicine [facility] Admission History and Physical Service Pager: [phone]  Patient name: Ray Hall Medical record number: [id number] Date of birth: Dec 27, 1960 Age: 51 y.o. Gender: male  Primary Care Provider: MERRELL, DAVID, MD  Chief Complaint: Lower extremity pain, right    History of Present Illness: Ray Hall is a 51 y.o. year old male presenting with a history of diabetes, tobacco abuse and hypertension presenting with right lower extremity pain, swelling and redness, which he noticed yesterday morning. It has increased in severity today and he developed fever, chills and nausea. He denies current chest pain or shortness of breath, but did experience shortness of breath yesterday that resolved. He experienced right arm numbness and tingling this afternoon that resolved on its own within 5 hours. He reports he feels "bad" and is in pain. He does not recall any form of trauma to the affected area and he has never experienced anything like this prior. The pain worsens with palpation and weight bearing. The pain is constant and not relieved by anything, although he has not tried anything but rest. He also complains of a headache. 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 He reports his sugars have been controlled on his medications and diet.  He has been without Lisinopril for a month and out of lasix for 2 weeks. He has 40 pack year history of tobacco abuse. He use to smoke 1 ppd of cigarettes and now he smokes cigars.   Patient Active Problem List  Diagnosis  . DIABETES MELLITUS, TYPE II  . HYPERTENSION  . History of recurrent UTIs  . LEG EDEMA, LEFT  . LOW BACK PAIN SYNDROME, SEVERE  . BENIGN PROSTATIC HYPERTROPHY, WITH OBSTRUCTION  . Paroxysmal atrial fibrillation  . Atrial flutter  . UTI (lower urinary tract infection)  . Toe ulcer due to DM  . Flu-like symptoms   Past Medical History: Past Medical History  Diagnosis Date  .  Sickle cell trait   . Normal nuclear stress test 07/2010    low prob of ischemia, EF 42%  . Echocardiogram abnormal     moderate LVH, mild LV hypokenesis, EF 42%  . History of Doppler ultrasound 07/2010    negative for DVT  . Abnormal CT of the abdomen     mild L hydronephrosis and hydroureter  . 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 Diabetes mellitus   . Hypertension    Past Surgical History: Past Surgical History  Procedure Date  . Radiofrequency ablation 10/16/10    Cardiac for atrial arrythmia, [facility]  . Dental extractions 10/15/10    prior to ablation   Social History: History  Substance Use Topics  . Smoking status: Current Every Day Smoker    Types: Cigars  . Smokeless tobacco: Never Used   Comment: 2 cigars  . Alcohol Use: No   For any additional social history documentation, please refer to relevant sections of EMR.  Family History: Family History  Problem Relation Age of Onset  . Sickle cell trait    . Heart failure Father   . Diabetes Father   . Diabetes Mother   . Hypertension Mother    Allergies: No Known Allergies No current facility-administered medications on file prior to encounter.   Current Outpatient Prescriptions on File Prior to Encounter  Medication Sig Dispense Refill  . cyclobenzaprine (FLEXERIL) 10 MG tablet Take 10 mg by mouth 3 (  three) times daily as needed. For muscle spasms.      Marland Kitchen diltiazem (CARDIZEM) 60 MG tablet Take 60 mg by mouth 2 (two) times daily.      . furosemide (LASIX) 40 MG tablet Take 1 tablet (40 mg total) by mouth daily.  30 tablet  0  . lisinopril (PRINIVIL,ZESTRIL) 20 MG tablet Take 1 tablet (20 mg total) by mouth daily.  30 tablet  11   Review Of Systems: Per HPI  Otherwise 12 point review of systems was performed and was unremarkable.  Physical Exam: BP 153/119  Pulse 115  Temp 99 F (37.2 C) (Oral)  Resp 18  SpO2 100% Exam: General: Alert. Oriented.  NAD. HEENT: AT.[place]. EENT: WNL. Some congestion. Cardiovascular: RRR. S1S2. No murmur. Respiratory: CTAB. No wheezing, rhonchi or rales. Abdomen: moderately soft. Obese. ND. NT. No HSM or masses. Hypoactive BS. Extremities: ROM WNL. Well perfused. +1/4 edema left LE. +2/4 Edema right LE. Erythema and TTP medial thigh down to medial ankle.  Skin: warm and dry, with the exception of area of concern.  Neuro: Grossly intact. No focal deficits.  Labs and Imaging: CBC BMET   Lab 01/14/12 1716  WBC 19.4*  HGB 13.5  HCT 37.7*  PLT 199    Lab 01/14/12 1716  NA 131*  K 3.8  CL 94*  CO2 23  BUN 8  CREATININE 0.79  GLUCOSE 259*  CALCIUM 9.5    Assesment/Plan: Ray Hall is a 51 y.o. year old male presenting with a history of diabetes, tobacco abuse and hypertension  with right lower extremity pain, swelling and redness, which he noticed yesterday morning. It has increased in severity today and he developed fever, chills and nausea. DVT vs Cellulitis with questionable ACS? - right arm numbness with nausea  --> r/o ACS d/t risk factors: cycled troponin and EKG pending - CXR: WNL  1. Right Lower extremity pain with fever: - Pain: Toradol 30mg  q 6hr PRN - Lower extremity venous doppler - CBC, CMP, A1c, UA - Blood cultures pending - Zosyn/vanc per pharmacy - Lovenox 1mg /kg BID until DVT r/o  2. Diabetes:  - Metformin continued - SSI- Sensitive - CBG q4hr, AC, HS  3. HTN: - Cardizem 60 mg BID, lisinopril 10 mg continued - lasix held  4. FENGI: - carb modified diet - 1/2 MIVF NS @ 128ml/h - Zofran PRN - Tylenol PRN - Ambien 5mg  PRN - Colace PRN  Felix Pacini, DO 01/15/2012, 2:09 AM  PGY-2 Addendum  HPI: Briefly, this is a 51 year old man with HTN and DM who presented to the ED with a 1 day history of pain, redness and swelling in the right leg.  This is associated with fevers and some nausea.  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  He has had some dyspnea in the past as well as some chest pain, but none currently.  Also had some right arm numbness and tingling that has resolved.  Compliant with diabetic meds, but has been out of lisinopril since August and Lasix for the last 2 weeks.   See excellent intern note for complete history.  Physical Exam BP 100/60  Pulse 94  Temp 98.4 F (36.9 C) (Oral)  Resp 18  Ht 6\' 8"  (2.032 m)  Wt 325 lb (147.419 kg)  BMI 35.70 kg/m2  SpO2 99% Gen: alert, cooperative, NAD HEENT: AT/[place], sclera white, MMM CV: RRR,  no murmurs Pulm: CTAB, no wheezes or rales Abd: +BS, soft, NTND Ext: bilateral pitting edema worse on right; right leg has area of tender erythema over lateral superior calf, knee and inferior thigh; right leg warm to touch compared to left; no abscess appreciated; no palpable cords; Homan's negative on left, unable to perform on right due to superficial tenderness; 1+ DP pulses bilaterally; no ulcers seen on feet Neuro: alert, oriented, grossly normal motor and sensation  Assessment and Plan 51 yo man with uncontrolled diabetes and hypertension presenting with right leg erythema, tenderness and swelling most consistent with cellulitis.  # Right leg erythema/tenderness/swelling: Likely cellulitis given appearance, fever and leukocytosis.  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  Wells score of 1 indicating low risk for DVT.   - f/u blood cultures - continue vanc and zosyn - toradol and Tylenol for pain control - monitor erythema and white count - will check lower extremity dopplers for DVT - will start treatment dose lovenox until dopplers are done  # Fever and leukocytosis: Likely secondary to acute cellulitis.  CXR negative.  Blood cultures pending. - Tylenol for fever - Zofran available for nausea - Monitor white count with treatment - Will check UA  # Diabetes: A1c of 8.5% in 04/2011.  Reports  compliance with metformin and glipizide. - Continue metformin (no indication for contrast studies) - Will hold glipizide and start sliding scale while in hospital - Will check A1c while here  # Hypertension: Normo- to hypertensive in ED.  Out of lisinopril and Lasix for the last several weeks.   - Restart lisinopril at 10mg  daily - Continue diltiazem - Hold Lasix for now - Continue to monitor - Will check EKG and troponin x1 with report of chest pain, although none currently, and history of a fib  FEN/GI: diabetic diet; NS at 100cc/hr PPx: treatment dose of lovenox until dopplers return Dispo: admit to floor  BOOTH, Kyston Gonce 01/15/2012, 7:02 AM

## 2012-01-15 NOTE — Progress Notes (Signed)
Inpatient Diabetes Program Recommendations  AACE/ADA: New Consensus Statement on Inpatient Glycemic Control (2013)  Target Ranges:  Prepandial:   less than 140 mg/dL      Peak postprandial:   less than 180 mg/dL (1-2 hours)      Critically ill patients:  140 - 180 mg/dL   Reason for Visit: Results for Ray Hall, Ray Hall (MRN [id number]) as of 01/15/2012 14:39  Ref. Range 01/14/2012 17:25 01/15/2012 07:03 01/15/2012 12:31  Glucose-Capillary Latest Range: 70-99 mg/dL 638 (H) 756 (H) 433 (H)   Note CBG's elevated.  A1C=7.5% indicating average CBG's around 165 mg/dL.  He states that he does check CBG's at home that they are usually between 120-160 mg/dL.  He takes oral diabetes agents (glipizide and glucophage) at home.  He was concerned about CBG's.  Discussed effects of stress and infection on CBG levels.  Note Lantus started today.  Please titrate Lantus based on fasting CBG's. Will follow.

## 2012-01-15 NOTE — Progress Notes (Signed)
Utilization review completed. Ahren Pettinger, RN, BSN. 

## 2012-01-15 NOTE — H&P (Signed)
[facility]MT[facility] Attending Admission Note: Renold Don MD Personal pager:  [phone] FPTS Service Pager:  [phone]  I  have seen and examined this patient, reviewed their chart. I have discussed this patient with the resident. I agree with the resident's findings, assessment and care plan.  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  Increasing swelling and redness for past 3-4 days.  Patient also complaining of pain in leg.  Has had some chills at home for past 2 days.  No fevers.  No nausea or vomiting.    Patient prompted to come to ED yesterday after 3-5 minute episode of chest pain and shortness of breath.  Occurred when walking from truck to truck stop (distance of parking lot).  Resolved with rest.  No further chest pain or dyspnea since then.    PE: Gen:  Alert, cooperative patient who appears stated age in no acute distress.  Vital signs reviewed.  Obese Cardiac:  Regular rate and rhythm without murmur auscultated.  Good S1/S2. Pulm:  Clear to auscultation bilaterally with good air movement.  No wheezes or rales noted.   Ext:  LLE +1 edema to mid-shin.  RLE with erythema and induration noted from proximal thigh to popliteal fossa, about 10 cm in length.  No redness noted below knee.  Does have +3 pitting edema to knee RLE.  TTP throughout redness but not distal.  No tenderness when bending his knee.    A/P: 1.  RLE edema:  Most likely cellulitis in combination with fevers, chills, leukocytosis.  Patient started on Vanc/Zosyn in ED.  Also started on Lovenox for possible DVT.  Dopplers pending this AM.  Much less likely septic joint -- do not note any overlying erythema of joint.  However, low threshold for imaging studies.   2.  Chest pain:  I cannot find EKG that has been scanned into chart.  Troponins negative.  Less likely to be ACS event.  Also less likely to be DVT.   However he is currently being anti-coagulated.  Await lower extremity dopplers.  If positive, consider CTA of chest to rule out PE.  Tobey Grim, MD

## 2012-01-15 NOTE — Discharge Summary (Signed)
Physician Discharge Summary  Patient ID: Ray Hall MRN: [id number] DOB/AGE: Dec 04, 1960 51 y.o.  Admit date: 01/14/2012 Discharge date: 01/22/2012  Admission Diagnoses: Lower extremity pain, right  Discharge Diagnoses:  Principal Problem:  *Cellulitis of right leg Active Problems:  Lower extremity pain, right  Fever   Discharged Condition: good  Hospital Course: DERRYK FITTS is a 51 y.o. year old febrile male with a history of diabetes, tobacco abuse and hypertension with right lower extremity pain, swelling and redness, which he noticed the day prior to admission. Toradol and oxycodone were given for pain. Blood cultures x2 obtained prior to antibiotics resulted NGTD. Zosyn/Vanc was started per pharmacy for two days and then switched to Vanc/primaxin due to lack of improvement in condition . Lovenox 1 mg/kg was administered for DVT therapeutic levels, until ruled out and then maintained at 80mg  for PPX. CBC, CMP, A1c and UA obtained. Tight control of his diabetes condition was maintained with Lantus and SSI. HTN medications were continued, except for the lasix. Venous doppler studies were negative for DVT. EKG normal. DX xray of Right knee WNL. A CT of the  Right LE ruled out abscess or deep infection, resulted with cellulitis. Upon discharge he was transitioned to PO doxycycline 100 mg BID and cipro 500 mg BID for 10 days   Consults: orthopedic surgery  Significant Diagnostic Studies: radiology: X-Ray: WNL and CT scan: Cellulitis 01/14/2012: CXR: WNL  Treatments: IV hydration, antibiotics: vancomycin, Zosyn, Cipro,Doxycycline and Primaxin, analgesia: acetaminophen, oxycodone, cardiac meds: Cardizem, ASA, anticoagulation: LMW heparin and insulin: Lantus and SSI  Discharge Exam: Blood pressure 156/97, pulse 65, temperature 98.4 F (36.9 C), temperature source Oral, resp. rate 16, height 6\' 8"  (2.032 m), weight 356 lb 1 oz (161.509 kg), SpO2 99.00%. Gen: NAD, alert, cooperative  with exam. Watching TV.  CV: RRR, no murmur  Resp: CTAB, no wheezes, non-labored  Abd: SNTND, BS present, no guarding or organomegaly  Ext: Right leg edema 3+, L eft leg 1+ edema. Pain to touch of right leg on medial aspect just above popliteal fossa. No erythema appreciated. Skin is not tense or shiny today.  Neuro: Alert and oriented, no gross deficits    Disposition: 01-Home or Self Care      Discharge Orders    Future Appointments: Provider: Department: Dept Phone: Center:   01/27/2012 10:00 AM Ozella Rocks, MD [facility] [phone] [facility]     Future Orders Please Complete By Expires   Diet - low sodium heart healthy      Increase activity slowly      Call MD for:  temperature >100.4      Call MD for:  persistant nausea and vomiting      Call MD for:  severe uncontrolled pain      Call MD for:  redness, tenderness, or signs of infection (pain, swelling, redness, odor or green/yellow discharge around incision site)      Call MD for:  difficulty breathing, headache or visual disturbances      Call MD for:  hives      Call MD for:  persistant dizziness or light-headedness      Call MD for:  extreme fatigue          Medication List     As of 01/22/2012  2:27 PM    TAKE these medications         ciprofloxacin 500 MG tablet   Commonly known as: CIPRO   Take 1 tablet (500 mg  total) by mouth 2 (two) times daily.      cyclobenzaprine 10 MG tablet   Commonly known as: FLEXERIL   Take 10 mg by mouth 3 (three) times daily as needed. For muscle spasms.      diltiazem 60 MG tablet   Commonly known as: CARDIZEM   Take 60 mg by mouth 2 (two) times daily.      doxycycline 100 MG tablet   Commonly known as: VIBRA-TABS   Take 1 tablet (100 mg total) by mouth every 12 (twelve) hours.      furosemide 40 MG tablet   Commonly known as: LASIX   Take 1 tablet (40 mg total) by mouth daily.      glipiZIDE 10 MG tablet   Commonly known as: GLUCOTROL   Take 10 mg by mouth  2 (two) times daily before a meal.      HYDROcodone-acetaminophen 10-500 MG per tablet   Commonly known as: LORTAB   Take 1 tablet by mouth every 8 (eight) hours as needed for pain.      lisinopril 20 MG tablet   Commonly known as: PRINIVIL,ZESTRIL   Take 1 tablet (20 mg total) by mouth daily.      lisinopril 20 MG tablet   Commonly known as: PRINIVIL,ZESTRIL   Take 20 mg by mouth daily.      metFORMIN 1000 MG tablet   Commonly known as: GLUCOPHAGE   Take 1,000 mg by mouth 2 (two) times daily with a meal.      multivitamin with minerals Tabs   Take 1 tablet by mouth daily.        Follow-up Information    Follow up with MERRELL, DAVID, MD. On 01/27/2012. (10 am)    Contact information:   [address] [place] [address] [phone]          Signed: Felix Pacini 01/22/2012, 2:27 PM

## 2012-01-15 NOTE — ED Notes (Addendum)
BP currently 153/119; pt reports has not had Lisinopril since December 14, 2011 - unable to fill Rx; reports missing PM dose of Cardizem; c/o fevers, chills, interm SOB, RLE pain from knee to ankle - worse to medial aspect of rgt knee; notable swelling to RLE - reddened - hot to touch

## 2012-01-16 ENCOUNTER — Encounter (HOSPITAL_COMMUNITY): Payer: Self-pay | Admitting: Radiology

## 2012-01-16 ENCOUNTER — Inpatient Hospital Stay (HOSPITAL_COMMUNITY): Payer: MEDICAID

## 2012-01-16 LAB — CBC WITH DIFFERENTIAL/PLATELET
Eosinophils Absolute: 0.4 10*3/uL (ref 0.0–0.7)
Hemoglobin: 10.2 g/dL — ABNORMAL LOW (ref 13.0–17.0)
Lymphocytes Relative: 11 % — ABNORMAL LOW (ref 12–46)
Lymphs Abs: 1.9 10*3/uL (ref 0.7–4.0)
Monocytes Relative: 9 % (ref 3–12)
Neutro Abs: 13.2 10*3/uL — ABNORMAL HIGH (ref 1.7–7.7)
Neutrophils Relative %: 78 % — ABNORMAL HIGH (ref 43–77)
Platelets: 156 10*3/uL (ref 150–400)
RBC: 3.62 MIL/uL — ABNORMAL LOW (ref 4.22–5.81)
WBC: 16.9 10*3/uL — ABNORMAL HIGH (ref 4.0–10.5)

## 2012-01-16 LAB — GLUCOSE, CAPILLARY: Glucose-Capillary: 300 mg/dL — ABNORMAL HIGH (ref 70–99)

## 2012-01-16 LAB — BASIC METABOLIC PANEL
BUN: 14 mg/dL (ref 6–23)
CO2: 22 mEq/L (ref 19–32)
Chloride: 100 mEq/L (ref 96–112)
GFR calc non Af Amer: 81 mL/min — ABNORMAL LOW (ref >90)
Glucose, Bld: 261 mg/dL — ABNORMAL HIGH (ref 70–99)
Potassium: 3.8 mEq/L (ref 3.5–5.1)
Sodium: 130 mEq/L — ABNORMAL LOW (ref 135–145)

## 2012-01-16 MED ORDER — INSULIN GLARGINE 100 UNIT/ML ~~LOC~~ SOLN
15.0000 [IU] | Freq: Every day | SUBCUTANEOUS | Status: DC
  Administered 2012-01-17: 15 [IU] via SUBCUTANEOUS

## 2012-01-16 MED ORDER — IOHEXOL 300 MG/ML  SOLN
100.0000 mL | Freq: Once | INTRAMUSCULAR | Status: AC | PRN
  Administered 2012-01-16: 100 mL via INTRAVENOUS

## 2012-01-16 MED ORDER — HYDROCODONE-ACETAMINOPHEN 10-325 MG PO TABS
1.0000 | ORAL_TABLET | ORAL | Status: DC | PRN
  Administered 2012-01-16: 1 via ORAL
  Filled 2012-01-16: qty 1

## 2012-01-16 MED ORDER — ENOXAPARIN SODIUM 80 MG/0.8ML ~~LOC~~ SOLN
80.0000 mg | SUBCUTANEOUS | Status: DC
  Administered 2012-01-17 – 2012-01-22 (×6): 80 mg via SUBCUTANEOUS
  Filled 2012-01-16 (×7): qty 0.8

## 2012-01-16 MED ORDER — OXYCODONE HCL 5 MG PO TABS
5.0000 mg | ORAL_TABLET | Freq: Four times a day (QID) | ORAL | Status: DC | PRN
  Administered 2012-01-16 – 2012-01-22 (×19): 5 mg via ORAL
  Filled 2012-01-16 (×20): qty 1

## 2012-01-16 NOTE — Progress Notes (Signed)
I have seen and examined this patient. I have discussed with Dr Claiborne Billings.  I agree with their findings and plans as documented in their progress note.  Acute Issues 1. Skin and Soft tissue infection lf right medial thigh. - No evidence of abscess or necrotizing fasciitis on CT of limb today. - Continue current Vanc/Zosyn. - K-pad (heating pad) to site - Keep leg elevated. -  analgesics

## 2012-01-16 NOTE — Progress Notes (Signed)
Pt seen and examined. Appreciate and agree with care provided by inpatient team. Will follow up with patient in outpatient clinic after discharge  Shelly Flatten, MD Family Medicine PGY-2 01/16/2012, 10:17 PM

## 2012-01-16 NOTE — Progress Notes (Signed)
Family Medicine Teaching Service                                                                 [facility] Progress Note     Patient name: TRAYVEON HAGEMEIER Medical record number: [id number] Date of birth: 1961/02/16 Age: 51 y.o. Gender: male    LOS: 2 days   Primary Care Provider: MERRELL, DAVID, MD  Overnight Events: No overnight events. Afebrile. Patient complains his pain is uncontrolled and  Medication is not helping him.   Objective: Vital signs in last 24 hours: BP 95/54  Pulse 104  Temp 98.1 F (36.7 C) (Oral)  Resp 18  Ht 6\' 8"  (2.032 m)  Wt 325 lb (147.419 kg)  BMI 35.70 kg/m2  SpO2 96%    Physical Exam: Gen: NAD. Smiling in bed. Company present. .  CV: RRR. No murmurs. Clicks, gallops or rubs Res: Clear breath sounds. No rales. No wheezes. Abd: Soft. Non tender. Non distended. No masses. No HSM Ext/Musc: Right medial leg extremely TTP. Patient is very guarded to exam. Erythema from midthigh to ankle. Area of ecchymosis and taut skin right medial thigh just above knee.  Skin is hot to touch in this area. Edema noted throughout right leg. 1-2/4. Neuro:Oriented in. No focal motor or sensory deficits.  Labs/Studies:   Medications: Scheduled Meds:   . aspirin EC  81 mg Oral Daily  . diltiazem  60 mg Oral Q12H  . docusate sodium  100 mg Oral BID  . enoxaparin (LOVENOX) injection  1 mg/kg Subcutaneous Q12H  . insulin aspart  0-15 Units Subcutaneous TID WC  . insulin aspart  0-5 Units Subcutaneous QHS  . insulin aspart  4 Units Subcutaneous TID WC  . insulin glargine  10 Units Subcutaneous Daily  . lisinopril  10 mg Oral Daily  . piperacillin-tazobactam (ZOSYN)  IV  3.375 g Intravenous Q8H  . vancomycin  1,500 mg Intravenous Q12H  . DISCONTD: metFORMIN  1,000 mg Oral BID WC   Continuous Infusions:   . sodium chloride 100 mL/hr at 01/15/12 2101   PRN Meds:.acetaminophen, acetaminophen, ketorolac, ondansetron (ZOFRAN) IV, ondansetron,  zolpidem  01/16/2012 CT IMPRESSION:  1. No osteomyelitis or soft tissue abscess.  2. Circumferential cellulitis of the distal thigh and around the  knee.  3. Small right knee effusion.  4. Benign sclerosis of the posterior lateral aspect of the distal  right femur of no significance.    Assessment/Plan: Assesment/Plan: CHRISTA MUNSHI is a 51 y.o. year old male presenting with a history of diabetes, tobacco abuse and hypertension with right lower extremity pain, swelling and redness, which he noticed yesterday morning. It has increased in severity today and he developed fever, chills and nausea. DVT vs Cellulitis with questionable ACS?  - right arm numbness with nausea --> r/o ACS d/t risk factors: cycled troponin and EKG pending  - CXR: WNL   1. Right Lower extremity pain with fever:  - Pain: Toradol 30mg  q 6hr PRN, consider adding  Norco today. - Lower extremity venous doppler: No evidence of DVT - CBC: 21.3 --> 16.9 - BMP: Hyponatremic, hypergylcemic - A1C: 7.5 - Blood cultures: NGTD - Zosyn/vanc per pharmacy  - Lovenox 1mg /kg BID until DVT r/o will discontinue today - Consider image  study for deep infection; Does not seem to be improving with antibiotics.  - CT today: Cellulitis, no abscess   2. Diabetes:  - Metformin discontinued - Lantus 10 mg --> lantus 15mg  - SSI- Sensitive  - CBG q4hr, AC, HS   3. HTN:  - Cardizem 60 mg BID, lisinopril 10 mg continued  - lasix held   4. FENGI:  - carb modified diet  - 1/2 MIVF NS @ 191ml/h  - Zofran PRN  - Tylenol PRN  - Ambien 5mg  PRN  - Colace PRN

## 2012-01-17 LAB — GLUCOSE, CAPILLARY
Glucose-Capillary: 195 mg/dL — ABNORMAL HIGH (ref 70–99)
Glucose-Capillary: 295 mg/dL — ABNORMAL HIGH (ref 70–99)
Glucose-Capillary: 276 mg/dL — ABNORMAL HIGH (ref 70–99)

## 2012-01-17 LAB — CBC WITH DIFFERENTIAL/PLATELET
Basophils Absolute: 0 10*3/uL (ref 0.0–0.1)
Basophils Relative: 0 % (ref 0–1)
Hemoglobin: 10 g/dL — ABNORMAL LOW (ref 13.0–17.0)
Lymphocytes Relative: 14 % (ref 12–46)
MCHC: 34.2 g/dL (ref 30.0–36.0)
Monocytes Relative: 7 % (ref 3–12)
Neutro Abs: 10 10*3/uL — ABNORMAL HIGH (ref 1.7–7.7)
Neutrophils Relative %: 76 % (ref 43–77)
WBC: 13.3 10*3/uL — ABNORMAL HIGH (ref 4.0–10.5)

## 2012-01-17 LAB — BASIC METABOLIC PANEL
BUN: 14 mg/dL (ref 6–23)
CO2: 22 mEq/L (ref 19–32)
Chloride: 104 mEq/L (ref 96–112)
GFR calc Af Amer: >90 mL/min (ref >90)
Potassium: 4.2 mEq/L (ref 3.5–5.1)

## 2012-01-17 MED ORDER — GLIPIZIDE 10 MG PO TABS
10.0000 mg | ORAL_TABLET | Freq: Two times a day (BID) | ORAL | Status: DC
  Administered 2012-01-17 – 2012-01-22 (×10): 10 mg via ORAL
  Filled 2012-01-17 (×13): qty 1

## 2012-01-17 MED ORDER — KETOROLAC TROMETHAMINE 30 MG/ML IJ SOLN
30.0000 mg | Freq: Four times a day (QID) | INTRAMUSCULAR | Status: AC | PRN
  Administered 2012-01-17 – 2012-01-20 (×10): 30 mg via INTRAVENOUS
  Filled 2012-01-17 (×10): qty 1

## 2012-01-17 NOTE — Progress Notes (Signed)
I discussed with Dr Claiborne Billings.  I agree with their plans documented in their progress note for today.  Given the depth of the skin and soft tissue infection along patient's medial left thigh, I am not very surprised that the resolution of his infectious process is slower than more superficial cellulitis infections.  I would continue the current IV antibiotic regiment as his systemic symptoms are improving.  If his localized soft tissue signs and symptoms have not improved after two more days of IV antibiotics or if his condition worsens, I would not change his current treatment regiment.

## 2012-01-17 NOTE — Progress Notes (Signed)
Family Medicine Teaching Service                                                                 [facility] Progress Note     Patient name: Ray Hall Medical record number: [id number] Date of birth: 04-Jun-1960 Age: 51 y.o. Gender: male    LOS: 3 days   Primary Care Provider: Shelly Flatten, MD  Overnight Events: Ray Hall did well overnight. The pain was controlled with the Toradol and oxycodone administered simultaneously. He is worries about missing so much work. I explained to him he would hospitalized and IV ABX for at least another 48 hours. He has remained afebrile, and kept his leg elevated.   He has concerns he will have to live this for the rest of his life. Explained the etiology of cellulitis and how his diabetes can play a role infections.   Objective: Vital signs in last 24 hours: BP 124/75  Pulse 89  Temp 99.9 F (37.7 C) (Oral)  Resp 20  Ht 6\' 8"  (2.032 m)  Wt 325 lb (147.419 kg)  BMI 35.70 kg/m2  SpO2 95%    Physical Exam: Gen: NAD. Smiling in bed. Wife present. .  CV: RRR. No murmurs. Clicks, gallops or rubs Res: Clear breath sounds. No rales. No wheezes. Abd: Soft. Non tender. Non distended. No masses. No HSM Ext/Musc: Right medial leg extremely TTP. Patient is very guarded to exam. Erythema from midthigh to ankle. Area of ecchymosis and taut skin right medial thigh just above knee.  Skin is hot to touch in this area, but lower leg is regular temperature today.  Edema noted throughout right leg. 2/4 easily, however difficult to get true reading d/t tenderness. Neuro:Oriented in. No focal motor or sensory deficits.  Labs/Studies:   Medications: Scheduled Meds:    . aspirin EC  81 mg Oral Daily  . diltiazem  60 mg Oral Q12H  . docusate sodium  100 mg Oral BID  . enoxaparin (LOVENOX) injection  80 mg Subcutaneous Q24H  . insulin aspart  0-15 Units Subcutaneous TID WC  . insulin aspart  0-5 Units Subcutaneous QHS  . insulin aspart  4 Units  Subcutaneous TID WC  . insulin glargine  15 Units Subcutaneous Daily  . lisinopril  10 mg Oral Daily  . piperacillin-tazobactam (ZOSYN)  IV  3.375 g Intravenous Q8H  . vancomycin  1,500 mg Intravenous Q12H  . DISCONTD: enoxaparin (LOVENOX) injection  1 mg/kg Subcutaneous Q12H  . DISCONTD: insulin glargine  10 Units Subcutaneous Daily   Continuous Infusions:    . sodium chloride 100 mL/hr at 01/15/12 2101   PRN Meds:.acetaminophen, acetaminophen, iohexol, ketorolac, ondansetron (ZOFRAN) IV, ondansetron, oxyCODONE, zolpidem, DISCONTD: HYDROcodone-acetaminophen    Assessment/Plan: Assesment/Plan: SHADE KALEY is a 51 y.o. year old male presenting with a history of diabetes, tobacco abuse and hypertension with right lower extremity pain, swelling and redness, which he noticed yesterday morning. It has increased in severity today and he developed fever, chills and nausea. DVT vs Cellulitis with questionable ACS?  - right arm numbness with nausea --> r/o ACS d/t risk factors: cycled troponin and EKG: All Negative - CXR: WNL   1. Right Lower extremity pain with fever:  - Pain: Toradol 30mg  q 6hr PRN,  Oxycodone  - Lower extremity venous doppler: No evidence of DVT - CBC: 21.3 --> 16.9 -->13.3 - A1C: 7.5 - Blood cultures: NGTD - Zosyn/vanc per pharmacy  - Lovenox 80 mg per pharmacy  - CT 9/19: Cellulitis, no abscess - Consider ID consult if  No improvement after 48 hours IV ABX - Will take leg measurements starting today and then to be completed daily for monitoring of edema/swelling.   2. Diabetes:  - Metformin discontinued - lantus 15mg  - SSI- Moderate - CBG q4hr, AC, HS   3. HTN:  - Cardizem 60 mg BID, lisinopril 10 mg continued  - lasix held   4. FENGI:  - carb modified diet  - 1/2 MIVF NS @ 13ml/h  - Zofran PRN  - Tylenol PRN  - Ambien 5mg  PRN  - Colace PRN

## 2012-01-18 LAB — VANCOMYCIN, TROUGH: Vancomycin Tr: 14.3 ug/mL (ref 10.0–20.0)

## 2012-01-18 LAB — GLUCOSE, CAPILLARY: Glucose-Capillary: 164 mg/dL — ABNORMAL HIGH (ref 70–99)

## 2012-01-18 LAB — BASIC METABOLIC PANEL
BUN: 15 mg/dL (ref 6–23)
Creatinine, Ser: 1.26 mg/dL (ref 0.50–1.35)
GFR calc Af Amer: 76 mL/min — ABNORMAL LOW (ref >90)
GFR calc non Af Amer: 66 mL/min — ABNORMAL LOW (ref >90)
Potassium: 4.1 mEq/L (ref 3.5–5.1)

## 2012-01-18 LAB — CBC WITH DIFFERENTIAL/PLATELET
Basophils Relative: 0 % (ref 0–1)
Eosinophils Absolute: 0.6 10*3/uL (ref 0.0–0.7)
MCH: 27.4 pg (ref 26.0–34.0)
MCHC: 34.7 g/dL (ref 30.0–36.0)
Neutro Abs: 7.6 10*3/uL (ref 1.7–7.7)
Neutrophils Relative %: 66 % (ref 43–77)
Platelets: 228 10*3/uL (ref 150–400)
RBC: 3.65 MIL/uL — ABNORMAL LOW (ref 4.22–5.81)

## 2012-01-18 MED ORDER — INSULIN GLARGINE 100 UNIT/ML ~~LOC~~ SOLN
20.0000 [IU] | Freq: Every day | SUBCUTANEOUS | Status: DC
  Administered 2012-01-18 – 2012-01-22 (×5): 20 [IU] via SUBCUTANEOUS

## 2012-01-18 NOTE — Progress Notes (Signed)
ANTIBIOTIC CONSULT NOTE - INITIAL  Pharmacy Consult for Vancocin/Zosyn Indication: cellulitis  No Known Allergies  Patient Measurements: Height: 6\' 8"  (203.2 cm) Weight: 325 lb (147.419 kg) IBW/kg (Calculated) : 96   Vital Signs: Temp: 98.1 F (36.7 C) (09/20 2138) Temp src: Oral (09/20 2138) BP: 147/86 mmHg (09/20 2138) Pulse Rate: 83  (09/20 2138)  Labs:  Basename 01/18/12 0253 01/17/12 1030 01/16/12 0930  WBC 11.4* 13.3* 16.9*  HGB 10.0* 10.0* 10.2*  PLT 228 201 156  LABCREA -- -- --  CREATININE 1.26 1.09 1.06   Estimated Creatinine Clearance: 118.2 ml/min (by C-G formula based on Cr of 1.26).  Microbiology: Recent Results (from the past 720 hour(s))  CULTURE, BLOOD (ROUTINE X 2)     Status: Normal (Preliminary result)   Collection Time   01/15/12 12:58 AM      Component Value Range Status Comment   Specimen Description BLOOD LEFT ARM   Final    Special Requests BOTTLES DRAWN AEROBIC AND ANAEROBIC 5CC EACH   Final    Culture  Setup Time 01/15/2012 09:13   Final    Culture     Final    Value:        BLOOD CULTURE RECEIVED NO GROWTH TO DATE CULTURE WILL BE HELD FOR 5 DAYS BEFORE ISSUING A FINAL NEGATIVE REPORT   Report Status PENDING   Incomplete   CULTURE, BLOOD (ROUTINE X 2)     Status: Normal (Preliminary result)   Collection Time   01/15/12  1:05 AM      Component Value Range Status Comment   Specimen Description BLOOD LEFT HAND   Final    Special Requests BOTTLES DRAWN AEROBIC ONLY 3CC   Final    Culture  Setup Time 01/15/2012 09:13   Final    Culture     Final    Value:        BLOOD CULTURE RECEIVED NO GROWTH TO DATE CULTURE WILL BE HELD FOR 5 DAYS BEFORE ISSUING A FINAL NEGATIVE REPORT   Report Status PENDING   Incomplete     Medical History: Past Medical History  Diagnosis Date  . Sickle cell trait   . Normal nuclear stress test 07/2010    low prob of ischemia, EF 42%  . Echocardiogram abnormal     moderate LVH, mild LV hypokenesis, EF 42%  .  History of Doppler ultrasound 07/2010    negative for DVT  . Abnormal CT of the abdomen     mild L hydronephrosis and hydroureter  . 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 Diabetes mellitus   . Hypertension     Medications:  Prescriptions prior to admission  Medication Sig Dispense Refill  . cyclobenzaprine (FLEXERIL) 10 MG tablet Take 10 mg by mouth 3 (three) times daily as needed. For muscle spasms.      Marland Kitchen diltiazem (CARDIZEM) 60 MG tablet Take 60 mg by mouth 2 (two) times daily.      . furosemide (LASIX) 40 MG tablet Take 1 tablet (40 mg total) by mouth daily.  30 tablet  0  . glipiZIDE (GLUCOTROL) 10 MG tablet Take 10 mg by mouth 2 (two) times daily before a meal.      . lisinopril (PRINIVIL,ZESTRIL) 20 MG tablet Take 20 mg by mouth daily.      . metFORMIN (GLUCOPHAGE) 1000 MG tablet Take 1,000 mg by mouth 2 (two) times daily with a meal.      .  Multiple Vitamin (MULTIVITAMIN WITH MINERALS) TABS Take 1 tablet by mouth daily.      Marland Kitchen lisinopril (PRINIVIL,ZESTRIL) 20 MG tablet Take 1 tablet (20 mg total) by mouth daily.  30 tablet  11   Scheduled:     . aspirin EC  81 mg Oral Daily  . diltiazem  60 mg Oral Q12H  . docusate sodium  100 mg Oral BID  . enoxaparin (LOVENOX) injection  80 mg Subcutaneous Q24H  . glipiZIDE  10 mg Oral BID AC  . insulin aspart  0-15 Units Subcutaneous TID WC  . insulin aspart  0-5 Units Subcutaneous QHS  . insulin aspart  4 Units Subcutaneous TID WC  . insulin glargine  15 Units Subcutaneous Daily  . lisinopril  10 mg Oral Daily  . piperacillin-tazobactam (ZOSYN)  IV  3.375 g Intravenous Q8H  . vancomycin  1,500 mg Intravenous Q12H    Assessment: 51yo male c/o RLE pain, swelling, and redness that worsened overnight until developed fever, chills, and nausea, to on vancomycin / Zosyn for possible cellulitis. Vancomycin trough (14.3 mcg/mL) is at-goal.   Goal of Therapy:  Vancomycin trough level 10-15  mcg/ml  Plan:  1. Continue vancomycin 1500mg  IV Q12H.  2. Continue Zosyn 3.375gm IV Q8H (4 hr infusion).   Emeline Gins  01/18/2012,4:22 AM

## 2012-01-18 NOTE — Progress Notes (Signed)
FMTS Attending Note Patient seen and examined by me, discussed with resident team and I agree with Dr. Alan Ripper assess/plan.  Mr. Ray Hall has soft tissue infection of the RLE without evident fever since admission.  He has been able to bear weight and describes difficulty flexing R knee that is related more to surrounding edema, denies pain in the joint with flexion.  Denies pain in the foot or ankle on the R, but does remark about decreased sensation in R first and second toes. He and the resident team agree that the edema is more pronounced today than on previous exams.  CT study done on admission have been reviewed.  Patient's WBC count has improved markedly since admission.   Exam Alert, watching TV in no distress HEENT Neck supple.  LEs: Marked pitting edema of RLE along medial aspect from just above knee to medial aspect lower leg (below knee).  Associated tenderness.  Is able to flex/extend R knee actively, with pain noted below the joint with flexion of knee. Edema extends into R ankle, making it difficult to appreciate dorsalis pedis pulse on the R foot.  Decreased sensation in R 1st and 2nd toes with fine touch testing. Brisk cap refill, no pallor.   Assess/Plan: Patient with deep soft tissue cellulitis and without suspicion for intra-articular involvement, who remains afebrile and with improvement in his WBC count since admission.  Would continue with current abx regimen of Vanc/Zosyn, continue elevation, heat pad. I agree with bedside doppler to identify dp pulse; absence of pain, pallor or myofascial involvement on prior CT makes compartment syndrome unlikely.  If worsening edema or worsening clinical exam, would consider re-imaging and Orthopedics consult. To hold IVF, as patient is taking orally without difficulty. Paula Compton, MD

## 2012-01-18 NOTE — Progress Notes (Signed)
Family Medicine Teaching Service                                                                 [facility] Progress Note     Patient name: Ray Hall Medical record number: [id number] Date of birth: 06-Sep-1960 Age: 51 y.o. Gender: male    LOS: 4 days   Primary Care Provider: Shelly Flatten, MD  Overnight Events: Ray Hall did well overnight. The pain was controlled with the Toradol and oxycodone administered simultaneously. He is able to walk on it now. He feels the pain has improved. He agrees the redness has almost completely resolved, except medial thigh.     Objective: Vital signs in last 24 hours: BP 109/51  Pulse 77  Temp 98.1 F (36.7 C) (Oral)  Resp 20  Ht 6\' 8"  (2.032 m)  Wt 353 lb 12.8 oz (160.483 kg)  BMI 38.87 kg/m2  SpO2 99%    Physical Exam: Gen: NAD.walking around room. Grandson present. CV: RRR. No murmurs. Clicks, gallops or rubs Res: Clear breath sounds. No rales. No wheezes. Abd: Soft. Non tender. Non distended. No masses. No HSM Ext/Musc: Tenderness has improved, but not resolved. Erythema has improved in the distal end of extremity. Area of ecchymosis and taut skin right medial thigh just above knee is the same. Most of skin is normal temperature today, except over medial thigh. Edema noted throughout right leg 3/4 and appears worse than prior days. Skin is stretch and shiny around shin. It is dimpled around medial thigh. Right foot is swollen and 1st/2nd toe is now numb. Perfused well.  Neuro:Oriented in. No focal motor or sensory deficits.  Labs/Studies:  CBC    Component Value Date/Time   WBC 11.4* 01/18/2012 0253   RBC 3.65* 01/18/2012 0253   HGB 10.0* 01/18/2012 0253   HCT 28.8* 01/18/2012 0253   PLT 228 01/18/2012 0253   MCV 78.9 01/18/2012 0253   MCH 27.4 01/18/2012 0253   MCHC 34.7 01/18/2012 0253   RDW 13.3 01/18/2012 0253   LYMPHSABS 2.2 01/18/2012 0253   MONOABS 1.0 01/18/2012 0253   EOSABS 0.6 01/18/2012 0253   BASOSABS 0.0  01/18/2012 0253    CMP     Component Value Date/Time   NA 134* 01/18/2012 0253   K 4.1 01/18/2012 0253   CL 102 01/18/2012 0253   CO2 21 01/18/2012 0253   GLUCOSE 315* 01/18/2012 0253   BUN 15 01/18/2012 0253   CREATININE 1.26 01/18/2012 0253   CREATININE 0.83 07/19/2011 1403   CALCIUM 8.4 01/18/2012 0253   PROT 7.6 01/14/2012 1716   ALBUMIN 3.9 01/14/2012 1716   AST 18 01/14/2012 1716   ALT 22 01/14/2012 1716   ALKPHOS 77 01/14/2012 1716   BILITOT 0.7 01/14/2012 1716   GFRNONAA 66* 01/18/2012 0253   GFRAA 76* 01/18/2012 0253   Medications: Scheduled Meds:    . aspirin EC  81 mg Oral Daily  . diltiazem  60 mg Oral Q12H  . docusate sodium  100 mg Oral BID  . enoxaparin (LOVENOX) injection  80 mg Subcutaneous Q24H  . glipiZIDE  10 mg Oral BID AC  . insulin aspart  0-15 Units Subcutaneous TID WC  . insulin aspart  0-5 Units Subcutaneous QHS  . insulin aspart  4 Units Subcutaneous TID WC  . insulin glargine  15 Units Subcutaneous Daily  . lisinopril  10 mg Oral Daily  . piperacillin-tazobactam (ZOSYN)  IV  3.375 g Intravenous Q8H  . vancomycin  1,500 mg Intravenous Q12H   Continuous Infusions:    . sodium chloride 100 mL/hr (01/17/12 2326)   PRN Meds:.acetaminophen, acetaminophen, ketorolac, ondansetron (ZOFRAN) IV, ondansetron, oxyCODONE, zolpidem    Assessment/Plan: Assesment/Plan: Ray Hall is a 51 y.o. year old male presenting with a history of diabetes, tobacco abuse and hypertension with right lower extremity pain, swelling and redness, which he noticed yesterday morning. It has increased in severity today and he developed fever, chills and nausea. DVT vs Cellulitis with questionable ACS?  - right arm numbness with nausea --> r/o ACS d/t risk factors: cycled troponin and EKG: All Negative - CXR: WNL   1. Right Lower extremity pain with fever:  - Pain: Toradol 30mg  q 6hr PRN, Oxycodone  - Lower extremity venous doppler: No evidence of DVT - CBC: 21.3 --> 16.9 -->13.3-->  11.4 - A1C: 7.5 - Blood cultures: NGTD - Zosyn/vanc per pharmacy  - Lovenox 80 mg per pharmacy  - CT 9/19: Cellulitis, no abscess - Doppler for pulses BID. No pallor, perfused. Decrease in feeling. R/O compartment syndrome.     2. Diabetes:  - Metformin discontinued - lantus 15mg  --> 20mg  - SSI- Moderate - CBG q4hr, AC, HS   3. HTN:  - Cardizem 60 mg BID, lisinopril 10 mg continued  - lasix held   4. FENGI:  - carb modified diet  - 1/2 MIVF NS @ 131ml/h; DC today - Zofran PRN  - Tylenol PRN  - Ambien 5mg  PRN  - Colace PRN   Code: Full PPX: 80 mg Lovenox per pharmacy for BMI >30 Dispo: Pending clinical improvement and IV ABX treatment.

## 2012-01-19 LAB — CBC WITH DIFFERENTIAL/PLATELET
Basophils Absolute: 0 10*3/uL (ref 0.0–0.1)
Basophils Relative: 0 % (ref 0–1)
Eosinophils Absolute: 0.6 10*3/uL (ref 0.0–0.7)
Eosinophils Relative: 5 % (ref 0–5)
HCT: 27.3 % — ABNORMAL LOW (ref 39.0–52.0)
MCH: 27.7 pg (ref 26.0–34.0)
MCHC: 35.2 g/dL (ref 30.0–36.0)
MCV: 78.7 fL (ref 78.0–100.0)
Monocytes Absolute: 1 10*3/uL (ref 0.1–1.0)
RDW: 13.6 % (ref 11.5–15.5)

## 2012-01-19 LAB — BASIC METABOLIC PANEL
CO2: 21 mEq/L (ref 19–32)
Calcium: 8.5 mg/dL (ref 8.4–10.5)
Creatinine, Ser: 1.13 mg/dL (ref 0.50–1.35)

## 2012-01-19 MED ORDER — SODIUM CHLORIDE 0.9 % IV SOLN
500.0000 mg | Freq: Four times a day (QID) | INTRAVENOUS | Status: DC
  Administered 2012-01-19 – 2012-01-22 (×12): 500 mg via INTRAVENOUS
  Filled 2012-01-19 (×14): qty 500

## 2012-01-19 NOTE — Progress Notes (Signed)
Family Medicine Teaching Service Daily Progress Note Service Page: [phone]  Patient Assessment: 51 y.o. year old male with a history of DM, tobacco abuse and HTN with right lower extremity pain, swelling and redness.  Subjective: Pain the same, still able to ambulate. Sleeping well, appetite good. Hasn't noticed any increase in erythema, numbness, or discomfort.   Objective: Temp:  [98.4 F (36.9 C)-98.7 F (37.1 C)] 98.4 F (36.9 C) (09/22 2130) Pulse Rate:  [72-84] 72  (09/22 0614) Resp:  [18-20] 18  (09/22 0614) BP: (135-155)/(79-92) 142/79 mmHg (09/22 0614) SpO2:  [100 %] 100 % (09/22 8657) Exam: Gen: NAD, alert, cooperative with exam, soundly sleeping.  HEENT: NCAT, EOMI, PERRL CV: RRR, no murmur Resp: CTABL, no wheezes, non-labored Abd: SNTND, BS present, no guarding or organomegaly Ext: Right leg edema 3+, L eft leg 1+ edema. Pain to touch of right leg on medial aspect just above popliteal fossa.Marland Kitchen No erythema appreciated. DP pulse 2+ on L, barely palpable on R but biphasic on doppler. Measures 58.3 cm at line drawn above knee on the right. Tense and shiny.  Neuro: Alert and oriented, no gross deficits, decreased sensation on Right lower extremity.    I have reviewed the patient's medications, labs, imaging, and diagnostic testing.  Notable results are summarized below.  CBC BMET   Lab 01/19/12 0605 01/18/12 0253 01/17/12 1030  WBC 11.5* 11.4* 13.3*  HGB 9.6* 10.0* 10.0*  HCT 27.3* 28.8* 29.2*  PLT 250 228 201    Lab 01/19/12 0605 01/18/12 0253 01/17/12 1030  NA 136 134* 136  K 4.0 4.1 4.2  CL 105 102 104  CO2 21 21 22   BUN 14 15 14   CREATININE 1.13 1.26 1.09  GLUCOSE 164* 315* 313*  CALCIUM 8.5 8.4 8.2*     Imaging/Diagnostic Tests: CT Femur with 01/16/2012 IMPRESSION: 1. No osteomyelitis or soft tissue abscess. 2. Circumferential cellulitis of the distal thigh and around the knee. 3. Small right knee effusion. 4. Benign sclerosis of the posterior  lateral aspect of the distal right femur of no significance.  CXR 01/15/2012 IMPRESSION: 1. Mild thoracic spondylosis. Otherwise, no significant abnormality identified.     Assesment/Plan: WAH SABIC is a 51 y.o. year old male with a history of DM, tobacco abuse and HTN with right lower extremity pain, swelling and redness.   1. Right Lower extremity pain with fever: continued improvement in erythema, steady white count, and pain the same.  - Pulses present to doppler, no pallor, or pain except at sight. Decreased feeling unchanged.  - Pain: Toradol 30mg  q 6hr PRN, Oxycodone  - Lower extremity venous doppler: No evidence of DVT  - CBC: 21.3 --> 16.9 -->13.3--> 11.4 -> 11.5 - A1C: 7.5  - Blood cultures: NGTD  - Zosyn/vanc per pharmacy   - Cre at admission 0.79 bumped to 1.26 after 3 days treatment. Down to 1.13 today.   - Consider doxy PO +/- augmentin or unasyn IV if trend continues - Lovenox 80 mg per pharmacy  - CT 9/19: Cellulitis, no abscess  - Doppler for pulses BID   2. Borderline AKI: Gradual increase in creatinine as described above. Possibly due to vancomycin.  - doesn't technically meet requirements of AKI ( inc in Cre of 0.3 in 48 hrs or less or increase in creastyine of 50%, or UOP <0.5 mL/kg/hr), but high degree of suspicion with offending agent.  - strict I/O - consider changing antibiotics as above if trend continues - monitor creatinine closely with  daily BMP  3. Diabetes: CBGs- 149-203 - Metformin discontinued  - lantus increased from 15 to 20  - SS I- Moderate  - MOnitor CBGs  4. HTN: 135-155/84-92 - Cardizem 60 mg BID, lisinopril 10 mg continued  - lasix held   5. FENGI:  - carb modified diet  - IVF saline lock  - Zofran, tylenol, ambien 5mg  PRN  - Schedule colace  Code: Full  PPX: 80 mg Lovenox per pharmacy for BMI >30  Dispo: Pending clinical improvement and IV ABX treatment.  Kevin Fenton, MD 01/19/2012, 8:20 AM

## 2012-01-19 NOTE — Consult Note (Signed)
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 Increasing swelling and redness for past 3-4 days. Patient also complaining of pain in leg. Has had some chills at home for past 2 days. Patient reports having a tendency for swelling in the right leg towards the end of the day that typically resolves overnight.   Past Medical History  Diagnosis Date  . Sickle cell trait   . Normal nuclear stress test 07/2010    low prob of ischemia, EF 42%  . Echocardiogram abnormal     moderate LVH, mild LV hypokenesis, EF 42%  . History of Doppler ultrasound 07/2010    negative for DVT  . Abnormal CT of the abdomen     mild L hydronephrosis and hydroureter  . 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 Diabetes mellitus   . Hypertension     Past Surgical History  Procedure Date  . Radiofrequency ablation 10/16/10    Cardiac for atrial arrythmia, [facility]  . Dental extractions 10/15/10    prior to ablation    Family History  Problem Relation Age of Onset  . Sickle cell trait    . Heart failure Father   . Diabetes Father   . Diabetes Mother   . Hypertension Mother     Social History:  reports that he has been smoking Cigars.  He has never used smokeless tobacco. He reports that he does not drink alcohol. His drug history not on file.  Allergies: No Known Allergies  Medications: I have reviewed the patient's current medications.  Results for orders placed during the hospital encounter of 01/14/12 (from the past 48 hour(s))  GLUCOSE, CAPILLARY     Status: Abnormal   Collection Time   01/17/12  4:21 PM      Component Value Range Comment   Glucose-Capillary 295 (*) 70 - 99 mg/dL   GLUCOSE, CAPILLARY     Status: Abnormal   Collection Time   01/17/12  9:36 PM      Component Value Range Comment   Glucose-Capillary 276 (*) 70 - 99 mg/dL    Comment 1 Notify RN     VANCOMYCIN, [place]     Status: Normal   Collection Time   01/18/12  2:53 AM      Component Value Range Comment   Vancomycin Tr 14.3  10.0 - 20.0 ug/mL   BASIC METABOLIC PANEL     Status: Abnormal   Collection Time   01/18/12  2:53 AM      Component Value Range Comment   Sodium 134 (*) 135 - 145 mEq/L    Potassium 4.1  3.5 - 5.1 mEq/L    Chloride 102  96 - 112 mEq/L    CO2 21  19 - 32 mEq/L    Glucose, Bld 315 (*) 70 - 99 mg/dL    BUN 15  6 - 23 mg/dL    Creatinine, Ser 1.61  0.50 - 1.35 mg/dL    Calcium 8.4  8.4 - 09.6 mg/dL    GFR calc non Af Amer 66 (*) >90 mL/min    GFR calc Af Amer 76 (*) >90 mL/min   CBC WITH DIFFERENTIAL     Status: Abnormal   Collection Time  01/18/12  2:53 AM      Component Value Range Comment   WBC 11.4 (*) 4.0 - 10.5 K/uL    RBC 3.65 (*) 4.22 - 5.81 MIL/uL    Hemoglobin 10.0 (*) 13.0 - 17.0 g/dL    HCT 60.4 (*) 54.0 - 52.0 %    MCV 78.9  78.0 - 100.0 fL    MCH 27.4  26.0 - 34.0 pg    MCHC 34.7  30.0 - 36.0 g/dL    RDW 98.1  19.1 - 47.8 %    Platelets 228  150 - 400 K/uL    Neutrophils Relative 66  43 - 77 %    Neutro Abs 7.6  1.7 - 7.7 K/uL    Lymphocytes Relative 19  12 - 46 %    Lymphs Abs 2.2  0.7 - 4.0 K/uL    Monocytes Relative 9  3 - 12 %    Monocytes Absolute 1.0  0.1 - 1.0 K/uL    Eosinophils Relative 5  0 - 5 %    Eosinophils Absolute 0.6  0.0 - 0.7 K/uL    Basophils Relative 0  0 - 1 %    Basophils Absolute 0.0  0.0 - 0.1 K/uL   GLUCOSE, CAPILLARY     Status: Abnormal   Collection Time   01/18/12  6:58 AM      Component Value Range Comment   Glucose-Capillary 237 (*) 70 - 99 mg/dL   GLUCOSE, CAPILLARY     Status: Abnormal   Collection Time   01/18/12 12:12 PM      Component Value Range Comment   Glucose-Capillary 203 (*) 70 - 99 mg/dL    Comment 1 Notify RN     GLUCOSE, CAPILLARY      Status: Abnormal   Collection Time   01/18/12  4:50 PM      Component Value Range Comment   Glucose-Capillary 164 (*) 70 - 99 mg/dL   GLUCOSE, CAPILLARY     Status: Abnormal   Collection Time   01/18/12 10:02 PM      Component Value Range Comment   Glucose-Capillary 165 (*) 70 - 99 mg/dL   CBC WITH DIFFERENTIAL     Status: Abnormal   Collection Time   01/19/12  6:05 AM      Component Value Range Comment   WBC 11.5 (*) 4.0 - 10.5 K/uL    RBC 3.47 (*) 4.22 - 5.81 MIL/uL    Hemoglobin 9.6 (*) 13.0 - 17.0 g/dL    HCT 29.5 (*) 62.1 - 52.0 %    MCV 78.7  78.0 - 100.0 fL    MCH 27.7  26.0 - 34.0 pg    MCHC 35.2  30.0 - 36.0 g/dL    RDW 30.8  65.7 - 84.6 %    Platelets 250  150 - 400 K/uL    Neutrophils Relative 63  43 - 77 %    Neutro Abs 7.2  1.7 - 7.7 K/uL    Lymphocytes Relative 23  12 - 46 %    Lymphs Abs 2.6  0.7 - 4.0 K/uL    Monocytes Relative 9  3 - 12 %    Monocytes Absolute 1.0  0.1 - 1.0 K/uL    Eosinophils Relative 5  0 - 5 %    Eosinophils Absolute 0.6  0.0 - 0.7 K/uL    Basophils Relative 0  0 - 1 %    Basophils Absolute 0.0  0.0 - 0.1  K/uL   BASIC METABOLIC PANEL     Status: Abnormal   Collection Time   01/19/12  6:05 AM      Component Value Range Comment   Sodium 136  135 - 145 mEq/L    Potassium 4.0  3.5 - 5.1 mEq/L    Chloride 105  96 - 112 mEq/L    CO2 21  19 - 32 mEq/L    Glucose, Bld 164 (*) 70 - 99 mg/dL    BUN 14  6 - 23 mg/dL    Creatinine, Ser 1.47  0.50 - 1.35 mg/dL    Calcium 8.5  8.4 - 82.9 mg/dL    GFR calc non Af Amer 75 (*) >90 mL/min    GFR calc Af Amer 87 (*) >90 mL/min   GLUCOSE, CAPILLARY     Status: Abnormal   Collection Time   01/19/12  7:17 AM      Component Value Range Comment   Glucose-Capillary 149 (*) 70 - 99 mg/dL   GLUCOSE, CAPILLARY     Status: Abnormal   Collection Time   01/19/12 11:42 AM      Component Value Range Comment   Glucose-Capillary 198 (*) 70 - 99 mg/dL       ROS: significant as noted above  Physical Exam:  Obese BM, A and O, c/o right leg pain. 3+ edema right mid thigh to foot, induration about medial knee but no fluctuance or obvious effusion. Knee with limited motion but minimal pain with passive ROM and restriction appears related to surrounding soft tissue tenderness. Good ankle ROM, neg homans, no palpable pulses distally. No inguinal tenderness but doppler read as showing enlarged nodes. Vitals Temp:  [98.4 F (36.9 C)-98.7 F (37.1 C)] 98.4 F (36.9 C) (09/22 5621) Pulse Rate:  [72-84] 72  (09/22 0614) Resp:  [18-20] 18  (09/22 0614) BP: (135-155)/(79-92) 142/79 mmHg (09/22 0614) SpO2:  [100 %] 100 % (09/22 3086)  Assessment/Plan: Impression: RLE cellulitis superimposed on likely chronic venous insufficiency Treatment: Agree with MRI to r/o abscess. Elevate, warm compresses, edema reduction techniques, IV abx as you are. Will f/u after MRI.  Adisson Deak M 01/19/2012, 1:35 PM

## 2012-01-19 NOTE — Progress Notes (Signed)
ANTIBIOTIC CONSULT NOTE - FOLLOW UP  Pharmacy Consult for Vancomycin/Zosyn Indication: cellulitis  No Known Allergies  Patient Measurements: Height: 6\' 8"  (203.2 cm) Weight: 353 lb 12.8 oz (160.483 kg) IBW/kg (Calculated) : 96    Vital Signs: Temp: 98.4 F (36.9 C) (09/22 0614) BP: 142/79 mmHg (09/22 0614) Pulse Rate: 72  (09/22 0614) Intake/Output from previous day: 09/21 0701 - 09/22 0700 In: 960 [P.O.:960] Out: -  Intake/Output from this shift:    Labs:  Basename 01/19/12 0605 01/18/12 0253 01/17/12 1030  WBC 11.5* 11.4* 13.3*  HGB 9.6* 10.0* 10.0*  PLT 250 228 201  LABCREA -- -- --  CREATININE 1.13 1.26 1.09   Estimated Creatinine Clearance: 137.7 ml/min (by C-G formula based on Cr of 1.13).  Basename 01/18/12 0253  VANCOTROUGH 14.3  VANCOPEAK --  VANCORANDOM --  GENTTROUGH --  GENTPEAK --  GENTRANDOM --  TOBRATROUGH --  TOBRAPEAK --  TOBRARND --  AMIKACINPEAK --  AMIKACINTROU --  AMIKACIN --     Microbiology: Recent Results (from the past 720 hour(s))  CULTURE, BLOOD (ROUTINE X 2)     Status: Normal (Preliminary result)   Collection Time   01/15/12 12:58 AM      Component Value Range Status Comment   Specimen Description BLOOD LEFT ARM   Final    Special Requests BOTTLES DRAWN AEROBIC AND ANAEROBIC 5CC EACH   Final    Culture  Setup Time 01/15/2012 09:13   Final    Culture     Final    Value:        BLOOD CULTURE RECEIVED NO GROWTH TO DATE CULTURE WILL BE HELD FOR 5 DAYS BEFORE ISSUING A FINAL NEGATIVE REPORT   Report Status PENDING   Incomplete   CULTURE, BLOOD (ROUTINE X 2)     Status: Normal (Preliminary result)   Collection Time   01/15/12  1:05 AM      Component Value Range Status Comment   Specimen Description BLOOD LEFT HAND   Final    Special Requests BOTTLES DRAWN AEROBIC ONLY 3CC   Final    Culture  Setup Time 01/15/2012 09:13   Final    Culture     Final    Value:        BLOOD CULTURE RECEIVED NO GROWTH TO DATE CULTURE WILL BE HELD  FOR 5 DAYS BEFORE ISSUING A FINAL NEGATIVE REPORT   Report Status PENDING   Incomplete     Anti-infectives     Start     Dose/Rate Route Frequency Ordered Stop   01/19/12 1300   imipenem-cilastatin (PRIMAXIN) 500 mg in sodium chloride 0.9 % 100 mL IVPB        500 mg 200 mL/hr over 30 Minutes Intravenous 4 times per day 01/19/12 1146     01/15/12 1600   vancomycin (VANCOCIN) 1,500 mg in sodium chloride 0.9 % 500 mL IVPB        1,500 mg 250 mL/hr over 120 Minutes Intravenous Every 12 hours 01/15/12 0507     01/15/12 0800   piperacillin-tazobactam (ZOSYN) IVPB 3.375 g  Status:  Discontinued        3.375 g 12.5 mL/hr over 240 Minutes Intravenous Every 8 hours 01/15/12 0507 01/19/12 1046   01/15/12 0515   vancomycin (VANCOCIN) 1,500 mg in sodium chloride 0.9 % 500 mL IVPB        1,500 mg 250 mL/hr over 120 Minutes Intravenous NOW 01/15/12 0507 01/15/12 0803   01/15/12 0100  vancomycin (VANCOCIN) IVPB 1000 mg/200 mL premix        1,000 mg 200 mL/hr over 60 Minutes Intravenous  Once 01/15/12 0050 01/15/12 0412   01/15/12 0100  piperacillin-tazobactam (ZOSYN) IVPB 3.375 g       3.375 g 12.5 mL/hr over 240 Minutes Intravenous  Once 01/15/12 0050 01/15/12 0250          Assessment: 51 y.o male with worsening RLE cellulitis was on vancomycin and zosyn, consulted to switch from zosyn to primaxin. WBC stable, afeb. Scr improving, Crcl 137.7 vanc trough (9/21) was at goal 14.63mcg/ml.  Goal of Therapy:  Vancomycin trough level 10-15 mcg/ml  Plan:  1. Initiate Primaxin 500mg  IV Q6H 2. Continue vancomycin 1500mg  IV Q12H 3. Follow up cultures and clinical progress 4. Monitor renal function  Bola A. Wandra Feinstein D Clinical Pharmacist Pager:[phone] Phone [phone] 01/19/2012 11:55 AM

## 2012-01-19 NOTE — Progress Notes (Signed)
Interval note Seen for serial exam Patient stating that pain is the same to better. Limited ROM he feels is due to tightness of tissue surrounding the knee. Feels that the swelling has increased some. Numbness of medial ankle and toes unchanged  O:  Right LE: Limited ROM of knee and ankle. Pulse not palpable but biphasic on doppler, slight increase of swelling compared to this Am. Tense, shiny skin, with 3+ edema. Painful to touch of medial aspect of knee and above for approx 8 inches. Decreased sensation of right medial ankle and toes, intact on lateral aspect of ankle.  A/P  Right leg cellulitis - Continue IV Imipenem and Vancomycin - Follow up MRI  And cultures - CBC in the Am - BID doppler pulse checks.

## 2012-01-19 NOTE — Progress Notes (Signed)
FMTS Attending Note Patient seen and examined by me, discussed with resident note, I agree with assess/plan with following additions. [name]'s R leg cellulitis appears worse to me than at my exam yesterday; more extensive erythema and callor.  He is able to flex R knee somewhat, although less than yesterday (which I believe is due to surrounding soft tissue edema).  Right DP pulse observable by doppler, however I cannot feel by tactile exam.  He reports feeling very down because he is in the hospital.  Denies prior history of depression.   Declines offers for chaplain or being wheeled out to [facility]trum of the [employer] for change of scenery.  Labs noted.  Stable WBC count around 11.  Tearful, but no apparent distress EXTS: R LE markedly edematous without area of fluctuance or apparent coalescence.  Extensive erythema extending from above the medial aspect of R knee to encompass most of the lower leg.  Able to flex/extend R ankle somewhat less than yesterday.  Assess/Plan: Worsening R LE cellulitis in patient with DM2.  To change zosyn for Primaxin; continue Vancomycin.  Bump in renal function may be attributed to dye from CT with contrast; is improving.  For MRI R leg today and Ortho consult given worsening.  I do not have high suspicion for septic arthritis of the R knee but it should be noted that his ROM of the knee is limited.  Tearful; appears to be situational in nature (no prior diagnosis depression).  He denies being fearful about his illness.   Ray Compton, MD

## 2012-01-20 LAB — CBC WITH DIFFERENTIAL/PLATELET
Eosinophils Relative: 5 % (ref 0–5)
HCT: 29.2 % — ABNORMAL LOW (ref 39.0–52.0)
Lymphocytes Relative: 21 % (ref 12–46)
Lymphs Abs: 2.4 10*3/uL (ref 0.7–4.0)
MCV: 78.5 fL (ref 78.0–100.0)
Monocytes Absolute: 1.3 10*3/uL — ABNORMAL HIGH (ref 0.1–1.0)
Platelets: 310 10*3/uL (ref 150–400)
RBC: 3.72 MIL/uL — ABNORMAL LOW (ref 4.22–5.81)
WBC: 11.8 10*3/uL — ABNORMAL HIGH (ref 4.0–10.5)

## 2012-01-20 LAB — BASIC METABOLIC PANEL
CO2: 24 mEq/L (ref 19–32)
Calcium: 9 mg/dL (ref 8.4–10.5)
Chloride: 105 mEq/L (ref 96–112)
Glucose, Bld: 88 mg/dL (ref 70–99)
Sodium: 139 mEq/L (ref 135–145)

## 2012-01-20 LAB — GLUCOSE, CAPILLARY: Glucose-Capillary: 150 mg/dL — ABNORMAL HIGH (ref 70–99)

## 2012-01-20 MED ORDER — IBUPROFEN 600 MG PO TABS
600.0000 mg | ORAL_TABLET | Freq: Four times a day (QID) | ORAL | Status: DC | PRN
  Administered 2012-01-21 – 2012-01-22 (×4): 600 mg via ORAL
  Filled 2012-01-20 (×6): qty 1

## 2012-01-20 NOTE — Progress Notes (Signed)
I discussed with Dr Kuneff.  I agree with their plans documented in their progress note for today.  

## 2012-01-20 NOTE — Progress Notes (Signed)
Family Medicine Teaching Service Daily Progress Note Service Page: [phone]  Patient Assessment: 51 y.o. year old male with a history of DM, tobacco abuse and HTN with right lower extremity pain, swelling and redness.  Subjective: Ray Hall is doing well today. He has noticed an improvement in both the swelling and his pain level. He is wanting to go home, but is agreeable to plan for additional IV antibiotics. Pain is limited to one focal area on the medial aspect of his thigh, just above popliteal fossa. Patient will be receiving an MRI of his right femur today.    Objective: Temp:  [97.9 F (36.6 C)-98.3 F (36.8 C)] 98 F (36.7 C) (09/23 0646) Pulse Rate:  [69-73] 69  (09/23 0646) Resp:  [18-20] 18  (09/23 0646) BP: (130-151)/(79-87) 151/82 mmHg (09/23 0646) SpO2:  [100 %] 100 % (09/23 0646) Exam: Gen: NAD, alert, cooperative with exam. Watching TV. HEENT: NCAT, EOMI, PERRL CV: RRR, no murmur Resp: CTABL, no wheezes, non-labored Abd: SNTND, BS present, no guarding or organomegaly Ext: Right leg edema 3+, L eft leg 1+ edema. Pain to touch of right leg on medial aspect just above popliteal fossa. No erythema appreciated. DP pulse 2+ on L, lightly palpable on R but biphasic on doppler. Skin is not tense or shiny today. Swelling as improved.  Neuro: Alert and oriented, no gross deficits, decreased sensation on Right lower extremity.    I have reviewed the patient's medications, labs, imaging, and diagnostic testing.  Notable results are summarized below.  CBC BMET   Lab 01/20/12 0750 01/19/12 0605 01/18/12 0253  WBC 11.8* 11.5* 11.4*  HGB 10.2* 9.6* 10.0*  HCT 29.2* 27.3* 28.8*  PLT 310 250 228    Lab 01/19/12 0605 01/18/12 0253 01/17/12 1030  NA 136 134* 136  K 4.0 4.1 4.2  CL 105 102 104  CO2 21 21 22   BUN 14 15 14   CREATININE 1.13 1.26 1.09  GLUCOSE 164* 315* 313*  CALCIUM 8.5 8.4 8.2*         Assesment/Plan: Ray Hall is a 51 y.o. year old male with a  history of DM, tobacco abuse and HTN with right lower extremity pain, swelling and redness.   1. Right Lower extremity pain with fever: continued improvement in erythema, white count, swelling and pain.  - Pulses present to doppler, no pallor, or pain except at focal area medial thigh. Decreased feeling unchanged.  - Pain: Toradol 30mg  q 6hr PRN, Oxycodone  - Lower extremity venous doppler: No evidence of DVT  - A1C: 7.5  - Blood cultures: NGTD  - Vanc/ Premaxin per pharmacy  - Lovenox 80 mg per pharmacy  - CT 9/19: Cellulitis, no abscess  - Doppler for pulses BID  - MRI ordered for today by Ortho  2. Borderline AKI: Gradual increase in creatinine as described above. Likely due to CT contrast. - doesn't technically meet requirements of AKI ( inc in Cre of 0.3 in 48 hrs or less or increase in creastyine of 50%, or UOP <0.5 mL/kg/hr) - strict I/O - monitor creatinine closely with daily BMP - Cr: improving today --> 1.01  3. Diabetes: CBGs- 84-150 - Metformin discontinued  - lantus  20  - SS I- Moderate, with  AC/HS - Monitor CBGs  4. HTN: 135-155/84-92 - Cardizem 60 mg BID, lisinopril 10 mg continued  - lasix held   5. FENGI:  - carb modified diet  - IVF saline lock  - Zofran, tylenol, ambien 5mg   PRN  - Schedule colace  Code: Full  PPX: 80 mg Lovenox per pharmacy for BMI >30  Dispo: Pending clinical improvement and IV ABX treatment.  Felix Pacini, DO 01/20/2012, 8:17 AM

## 2012-01-21 ENCOUNTER — Inpatient Hospital Stay (HOSPITAL_COMMUNITY): Payer: MEDICAID

## 2012-01-21 LAB — BASIC METABOLIC PANEL
BUN: 10 mg/dL (ref 6–23)
CO2: 22 mEq/L (ref 19–32)
Chloride: 106 mEq/L (ref 96–112)
Glucose, Bld: 127 mg/dL — ABNORMAL HIGH (ref 70–99)
Potassium: 3.8 mEq/L (ref 3.5–5.1)
Sodium: 139 mEq/L (ref 135–145)

## 2012-01-21 LAB — CULTURE, BLOOD (ROUTINE X 2): Culture: NO GROWTH

## 2012-01-21 LAB — CBC WITH DIFFERENTIAL/PLATELET
Eosinophils Absolute: 0.4 10*3/uL (ref 0.0–0.7)
Hemoglobin: 9.9 g/dL — ABNORMAL LOW (ref 13.0–17.0)
Lymphocytes Relative: 27 % (ref 12–46)
Lymphs Abs: 2.8 10*3/uL (ref 0.7–4.0)
MCH: 27.1 pg (ref 26.0–34.0)
Monocytes Relative: 9 % (ref 3–12)
Neutro Abs: 6.3 10*3/uL (ref 1.7–7.7)
Neutrophils Relative %: 61 % (ref 43–77)
RBC: 3.65 MIL/uL — ABNORMAL LOW (ref 4.22–5.81)
WBC: 10.4 10*3/uL (ref 4.0–10.5)

## 2012-01-21 LAB — GLUCOSE, CAPILLARY
Glucose-Capillary: 137 mg/dL — ABNORMAL HIGH (ref 70–99)
Glucose-Capillary: 89 mg/dL (ref 70–99)
Glucose-Capillary: 184 mg/dL — ABNORMAL HIGH (ref 70–99)
Glucose-Capillary: 145 mg/dL — ABNORMAL HIGH (ref 70–99)

## 2012-01-21 MED ORDER — LACTINEX PO CHEW
1.0000 | CHEWABLE_TABLET | Freq: Three times a day (TID) | ORAL | Status: DC
  Administered 2012-01-21: 1 via ORAL
  Filled 2012-01-21 (×6): qty 1

## 2012-01-21 NOTE — Progress Notes (Signed)
Family Medicine Teaching Service Daily Progress Note Service Page: [phone]  Patient Assessment: 51 y.o. year old male with a history of DM, tobacco abuse and HTN with right lower extremity pain, swelling and redness.  Subjective:  He is doing well today. Complains of pain in his 5th tarsal. When he stands it feels like a piece of gravel under his foot. Pain in the same focal area. Inquiring about when he can go home. MRI never completed d/t size.   Objective: Temp:  [98 F (36.7 C)-99 F (37.2 C)] 98.2 F (36.8 C) (09/24 0551) Pulse Rate:  [69-84] 69  (09/24 0551) Resp:  [18-20] 18  (09/24 0551) BP: (137-160)/(82-88) 137/84 mmHg (09/24 0551) SpO2:  [99 %-100 %] 99 % (09/24 0551) Weight:  [357 lb 9.6 oz (162.206 kg)] 357 lb 9.6 oz (162.206 kg) (09/23 1015) Exam: Gen: NAD, alert, cooperative with exam. Watching TV. HEENT: NCAT, EOMI, PERRL CV: RRR, no murmur Resp: CTABL, no wheezes, non-labored Abd: SNTND, BS present, no guarding or organomegaly Ext: Right leg edema 3+, L eft leg 1+ edema. Pain to touch of right leg on medial aspect just above popliteal fossa. No erythema appreciated. DP pulse 2+ on L, lightly palpable on R but biphasic on doppler. Skin is not tense or shiny today. Swelling as improved.  Neuro: Alert and oriented, no gross deficits, decreased sensation on Right lower extremity.    I have reviewed the patient's medications, labs, imaging, and diagnostic testing.  Notable results are summarized below.  CBC BMET   Lab 01/21/12 0510 01/20/12 0750 01/19/12 0605  WBC 10.4 11.8* 11.5*  HGB 9.9* 10.2* 9.6*  HCT 28.7* 29.2* 27.3*  PLT 306 310 250    Lab 01/21/12 0510 01/20/12 0750 01/19/12 0605  NA 139 139 136  K 3.8 3.9 4.0  CL 106 105 105  CO2 22 24 21   BUN 10 10 14   CREATININE 0.99 1.01 1.13  GLUCOSE 127* 88 164*  CALCIUM 8.8 9.0 8.5         Assesment/Plan: Ray Hall is a 51 y.o. year old male with a history of DM, tobacco abuse and HTN with  right lower extremity pain, swelling and redness.   1. Right Lower extremity pain with fever: continued improvement in erythema, white count, swelling and pain.  - Pulses present to doppler, no pallor, or pain except at focal area medial thigh. Decreased feeling unchanged.  - Pain: Toradol 30mg  q 6hr PRN, Oxycodone  - Lower extremity venous doppler: No evidence of DVT  - A1C: 7.5  - Blood cultures: NGTD  - Vanc/ Premaxin per pharmacy continue for at least another 48 hours, then consider transition to PO. - Lovenox 80 mg per pharmacy  - CT 9/19: Cellulitis, no abscess  - MRI ordered for today by Ortho, can not complete d/t size  2. Borderline AKI: Gradual increase in creatinine as described above. Likely due to CT contrast. - doesn't technically meet requirements of AKI ( inc in Cre of 0.3 in 48 hrs or less or increase in creastyine of 50%, or UOP <0.5 mL/kg/hr) - strict I/O - monitor creatinine closely with daily BMP - Cr: improving today --> 1.01 -->0.99  3. Diabetes: CBGs- 84-150 - Metformin discontinued  - lantus  20  - SS I- Moderate, with  AC/HS - Monitor CBGs  4. HTN: 135-155/84-92 - Cardizem 60 mg BID, lisinopril 10 mg continued  - lasix held   5. FENGI:  - carb modified diet  - IVF  saline lock  - Zofran, tylenol, ambien 5mg  PRN  - Schedule colace  Code: Full  PPX: 80 mg Lovenox per pharmacy for BMI >30  Dispo: Pending clinical improvement, 24 more hours on antibiotics and the transition to PO tomorrow.   Felix Pacini, DO 01/21/2012, 6:15 AM

## 2012-01-21 NOTE — Progress Notes (Signed)
Ray Hall  MRN: [id number] DOB/Age: Sep 17, 1960 51 y.o. Physician: Jacquelyne Balint Procedure:       Subjective: Subjectively patient states his leg is less painful. He still remains tender in popliteal region but no complaints of thigh or calf pain  Vital Signs Temp:  [98.2 F (36.8 C)-99 F (37.2 C)] 98.2 F (36.8 C) (09/24 0551) Pulse Rate:  [69-84] 69  (09/24 0551) Resp:  [18-20] 18  (09/24 0551) BP: (137-160)/(84-88) 137/84 mmHg (09/24 0551) SpO2:  [99 %-100 %] 99 % (09/24 0551) Weight:  [161.509 kg (356 lb 1 oz)-162.206 kg (357 lb 9.6 oz)] 161.509 kg (356 lb 1 oz) (09/24 0500)  Lab Results  Basename 01/21/12 0510 01/20/12 0750  WBC 10.4 11.8*  HGB 9.9* 10.2*  HCT 28.7* 29.2*  PLT 306 310   BMET  Basename 01/21/12 0510 01/20/12 0750  NA 139 139  K 3.8 3.9  CL 106 105  CO2 22 24  GLUCOSE 127* 88  BUN 10 10  CREATININE 0.99 1.01  CALCIUM 8.8 9.0   INR  Date Value Range Status  01/15/2012 1.11  0.00 - 1.49 Final     Exam He still has diffuse pitting edema through the right leg. He has resolving erythema noted in popliteal region with diffuse tenderness there but no fluctuant localized areas. His thigh and calf are soft and painfree to palpation and he has improved AROM to foot and ankle        Plan This looks to be a resolving cellulitis without evidence for and local abscess or myositis. Would continue IV antibiotics and edema control measures. Discussed with patient at length. I see no immediate need for any kind of surgical I&D and hopefully this will continue resolving. Please call if his exam worsens or changes. Thank you  [name] for Dr.Kevin Supple 01/21/2012, 9:25 AM

## 2012-01-21 NOTE — Progress Notes (Signed)
I have seen and examined this patient. I have discussed with Dr Claiborne Billings.  I agree with their findings and plans as documented in their progress note.  Improving acute skin and soft tissue infection of right leg and thigh. Continue IV antibiotics for next 24 to 48 hours, then anticipate switching to double oral antibiotic regiment to cover MRSA and Gram Negative rods for total of 14 days of effective antibiotic therapy.  Keeping leg elevated for 23 out of 24 hours a day will remain an important part of therapy until antibiotic treatment is stopped.

## 2012-01-22 ENCOUNTER — Other Ambulatory Visit: Payer: Self-pay | Admitting: Family Medicine

## 2012-01-22 DIAGNOSIS — L03119 Cellulitis of unspecified part of limb: Secondary | ICD-10-CM

## 2012-01-22 DIAGNOSIS — L02419 Cutaneous abscess of limb, unspecified: Secondary | ICD-10-CM

## 2012-01-22 LAB — BASIC METABOLIC PANEL
BUN: 10 mg/dL (ref 6–23)
Creatinine, Ser: 0.89 mg/dL (ref 0.50–1.35)
GFR calc non Af Amer: >90 mL/min (ref >90)
Glucose, Bld: 212 mg/dL — ABNORMAL HIGH (ref 70–99)
Potassium: 3.9 mEq/L (ref 3.5–5.1)

## 2012-01-22 LAB — CBC WITH DIFFERENTIAL/PLATELET
Eosinophils Absolute: 0.6 10*3/uL (ref 0.0–0.7)
HCT: 27.8 % — ABNORMAL LOW (ref 39.0–52.0)
Hemoglobin: 9.8 g/dL — ABNORMAL LOW (ref 13.0–17.0)
Lymphs Abs: 2.1 10*3/uL (ref 0.7–4.0)
MCH: 27.8 pg (ref 26.0–34.0)
MCHC: 35.3 g/dL (ref 30.0–36.0)
MCV: 79 fL (ref 78.0–100.0)
Monocytes Absolute: 0.9 10*3/uL (ref 0.1–1.0)
Monocytes Relative: 9 % (ref 3–12)
Neutrophils Relative %: 64 % (ref 43–77)
RBC: 3.52 MIL/uL — ABNORMAL LOW (ref 4.22–5.81)

## 2012-01-22 LAB — GLUCOSE, CAPILLARY
Glucose-Capillary: 143 mg/dL — ABNORMAL HIGH (ref 70–99)
Glucose-Capillary: 123 mg/dL — ABNORMAL HIGH (ref 70–99)

## 2012-01-22 LAB — CLOSTRIDIUM DIFFICILE BY PCR: Toxigenic C. Difficile by PCR: NEGATIVE

## 2012-01-22 MED ORDER — CIPROFLOXACIN HCL 500 MG PO TABS
500.0000 mg | ORAL_TABLET | Freq: Two times a day (BID) | ORAL | Status: DC
Start: 2012-01-22 — End: 2012-01-22
  Filled 2012-01-22 (×3): qty 1

## 2012-01-22 MED ORDER — HYDROCODONE-ACETAMINOPHEN 10-500 MG PO TABS: 1.0000 | ORAL_TABLET | Freq: Three times a day (TID) | ORAL | Status: DC | PRN

## 2012-01-22 MED ORDER — DOXYCYCLINE HYCLATE 100 MG PO TABS: 100.0000 mg | ORAL_TABLET | Freq: Two times a day (BID) | ORAL | Status: DC

## 2012-01-22 MED ORDER — DOXYCYCLINE HYCLATE 100 MG PO TABS
100.0000 mg | ORAL_TABLET | Freq: Two times a day (BID) | ORAL | Status: DC
Start: 2012-01-22 — End: 2012-01-22
  Filled 2012-01-22 (×2): qty 1

## 2012-01-22 MED ORDER — CIPROFLOXACIN HCL 500 MG PO TABS: 500.0000 mg | ORAL_TABLET | Freq: Two times a day (BID) | ORAL | Status: DC

## 2012-01-22 NOTE — Progress Notes (Signed)
Utilization review completed. Wesam Gearhart, RN, BSN. 

## 2012-01-22 NOTE — Progress Notes (Signed)
Patient ID: Ray Hall, male   DOB: Mar 25, 1961, 51 y.o.   MRN: [id number] Family Medicine Teaching Service Daily Progress Note Service Page: [phone]  Patient Assessment: 51 y.o. year old male with a history of DM, tobacco abuse and HTN with right lower extremity pain, swelling and redness.  Subjective:  Doing well this morning.  States swelling and pain have improved. No other complaints  Objective: Temp:  [98.2 F (36.8 C)-98.7 F (37.1 C)] 98.2 F (36.8 C) (09/25 0704) Pulse Rate:  [66-73] 66  (09/25 0704) Resp:  [16-20] 16  (09/25 0704) BP: (133-172)/(84-87) 133/84 mmHg (09/25 0704) SpO2:  [99 %-100 %] 99 % (09/25 0704) Exam: Gen: NAD, alert, cooperative with exam. Watching TV. CV: RRR, no murmur Resp: CTAB, no wheezes, non-labored Abd: SNTND, BS present, no guarding or organomegaly Ext: Right leg edema 3+, L eft leg 1+ edema. Pain to touch of right leg on medial aspect just above popliteal fossa. No erythema appreciated.  Skin is not tense or shiny today.   Neuro: Alert and oriented, no gross deficits    I have reviewed the patient's medications, labs, imaging, and diagnostic testing.  Notable results are summarized below.  CBC BMET   Lab 01/22/12 0500 01/21/12 0510 01/20/12 0750  WBC 10.0 10.4 11.8*  HGB 9.8* 9.9* 10.2*  HCT 27.8* 28.7* 29.2*  PLT 378 306 310    Lab 01/22/12 0500 01/21/12 0510 01/20/12 0750  NA 137 139 139  K 3.9 3.8 3.9  CL 103 106 105  CO2 22 22 24   BUN 10 10 10   CREATININE 0.89 0.99 1.01  GLUCOSE 212* 127* 88  CALCIUM 8.7 8.8 9.0         Assesment/Plan: STOY FENN is a 51 y.o. year old male with a history of DM, tobacco abuse and HTN with right lower extremity pain, swelling and redness.   1. Right Lower extremity pain with fever: continued improvement in erythema, white count, swelling and pain.  - Pain: Toradol 30mg  q 6hr PRN, Oxycodone  - Lower extremity venous doppler: No evidence of DVT  - A1C: 7.5  - Blood cultures:  NGTD  - Vanc/ Premaxin per pharmacy-will transition to PO doxycycline 100 mg BID and cipro 500 mg BID for 10 days with goal of discharge today - Lovenox 80 mg per pharmacy  - CT 9/19: Cellulitis, no abscess  - MRI ordered for today by Ortho, can not complete d/t size  2. Borderline AKI: Gradual increase in creatinine as described above. Likely due to CT contrast. - doesn't technically meet requirements of AKI ( inc in Cre of 0.3 in 48 hrs or less or increase in creastyine of 50%, or UOP <0.5 mL/kg/hr) - strict I/O - monitor creatinine closely with daily BMP - Cr: improving today --> 1.01 -->0.99-->0.89  3. Diabetes: CBGs- 84-150 - Metformin discontinued  - lantus  20  - SS I- Moderate, with  AC/HS - Monitor CBGs  4. HTN: 135-155/84-92 - Cardizem 60 mg BID, lisinopril 10 mg continued  - lasix held   5. FENGI:  - carb modified diet  - IVF saline lock  - Zofran, tylenol, ambien 5mg  PRN  - Schedule colace  Code: Full  PPX: 80 mg Lovenox per pharmacy for BMI >30  Dispo: discharge home today   Marikay Alar, MD 01/22/2012, 9:00 AM

## 2012-01-23 NOTE — Progress Notes (Signed)
I discussed with Dr Sonnenberg.  I agree with their plans documented in their progress note for today.  

## 2012-01-23 NOTE — Discharge Summary (Signed)
I discussed with Dr Kuneff.  I agree with their plans documented in their discharge note. 

## 2012-01-24 ENCOUNTER — Ambulatory Visit: Payer: Self-pay | Admitting: Family Medicine

## 2012-01-24 ENCOUNTER — Other Ambulatory Visit: Payer: Self-pay | Admitting: Family Medicine

## 2012-01-27 ENCOUNTER — Encounter: Payer: Self-pay | Admitting: Family Medicine

## 2012-01-27 ENCOUNTER — Ambulatory Visit (INDEPENDENT_AMBULATORY_CARE_PROVIDER_SITE_OTHER): Payer: Self-pay | Admitting: Family Medicine

## 2012-01-27 VITALS — BP 151/89 | HR 91 | Temp 98.3°F | Ht >= 80 in | Wt 329.0 lb

## 2012-01-27 DIAGNOSIS — M79604 Pain in right leg: Secondary | ICD-10-CM

## 2012-01-27 DIAGNOSIS — L03115 Cellulitis of right lower limb: Secondary | ICD-10-CM

## 2012-01-27 DIAGNOSIS — L02419 Cutaneous abscess of limb, unspecified: Secondary | ICD-10-CM

## 2012-01-27 DIAGNOSIS — I10 Essential (primary) hypertension: Secondary | ICD-10-CM

## 2012-01-27 DIAGNOSIS — E119 Type 2 diabetes mellitus without complications: Secondary | ICD-10-CM

## 2012-01-27 DIAGNOSIS — M545 Low back pain: Secondary | ICD-10-CM

## 2012-01-27 DIAGNOSIS — M79609 Pain in unspecified limb: Secondary | ICD-10-CM

## 2012-01-27 MED ORDER — GLIPIZIDE 10 MG PO TABS: 10.0000 mg | ORAL_TABLET | Freq: Two times a day (BID) | ORAL | Status: DC

## 2012-01-27 MED ORDER — CYCLOBENZAPRINE HCL 10 MG PO TABS: 10.0000 mg | ORAL_TABLET | Freq: Three times a day (TID) | ORAL | Status: DC | PRN

## 2012-01-27 MED ORDER — METFORMIN HCL 1000 MG PO TABS: 1000.0000 mg | ORAL_TABLET | Freq: Two times a day (BID) | ORAL | Status: DC

## 2012-01-27 MED ORDER — HYDROCODONE-ACETAMINOPHEN 10-500 MG PO TABS: 1.0000 | ORAL_TABLET | Freq: Three times a day (TID) | ORAL | Status: DC | PRN

## 2012-01-27 MED ORDER — FUROSEMIDE 40 MG PO TABS: 40.0000 mg | ORAL_TABLET | Freq: Two times a day (BID) | ORAL | Status: DC

## 2012-01-27 MED ORDER — LISINOPRIL 20 MG PO TABS: 20.0000 mg | ORAL_TABLET | Freq: Every day | ORAL | Status: DC

## 2012-01-27 NOTE — Patient Instructions (Addendum)
Thank you for coming in today You are doing well Please increase your Lasix to twice a day Plesae start taking your lisinopril Please come back on Wednesday for evaluation

## 2012-01-29 ENCOUNTER — Encounter: Payer: Self-pay | Admitting: Family Medicine

## 2012-01-29 ENCOUNTER — Ambulatory Visit (INDEPENDENT_AMBULATORY_CARE_PROVIDER_SITE_OTHER): Payer: Self-pay | Admitting: Family Medicine

## 2012-01-29 VITALS — BP 151/94 | HR 101 | Temp 98.8°F | Ht >= 80 in | Wt 326.0 lb

## 2012-01-29 DIAGNOSIS — L02419 Cutaneous abscess of limb, unspecified: Secondary | ICD-10-CM

## 2012-01-29 DIAGNOSIS — M79604 Pain in right leg: Secondary | ICD-10-CM

## 2012-01-29 DIAGNOSIS — M79609 Pain in unspecified limb: Secondary | ICD-10-CM

## 2012-01-29 DIAGNOSIS — L03115 Cellulitis of right lower limb: Secondary | ICD-10-CM

## 2012-01-29 MED ORDER — DOXYCYCLINE HYCLATE 100 MG PO TABS: 100.0000 mg | ORAL_TABLET | Freq: Two times a day (BID) | ORAL | Status: DC

## 2012-01-29 MED ORDER — CIPROFLOXACIN HCL 500 MG PO TABS: 500.0000 mg | ORAL_TABLET | Freq: Two times a day (BID) | ORAL | Status: DC

## 2012-01-29 NOTE — Assessment & Plan Note (Signed)
Improving, will have him schedule aleve BID until he is better.  He feels the pain has minimized enough to go back to work, so I have written a note for that.  He will only take Vicodin as needed for pain, and knows he cannot take it when driving.

## 2012-01-29 NOTE — Assessment & Plan Note (Signed)
Resolving Markings made on leg made (Leg circ 57 inches, medial thigh area of tenderness 2x3cm) F/u in 48 hrs Continue doxy/cipro MRI w/o evidence of abscess

## 2012-01-29 NOTE — Progress Notes (Signed)
  Subjective:    Patient ID: Ray Hall, male    DOB: 26-Jun-1960, 51 y.o.   MRN: [id number]  HPI  Ryker comes in for follow up of his right leg cellulitis, pain and swelling.  He was discharged from the hospital a week ago, and seen in clinic on Monday.  He says it has improved some since Monday.  It is no longer red, but is still somewhat painful.  He has not had fevers or chills.  There is no area of fluctuance or drainage.  He says he can walk now without difficulty, and is taking 1-2 vicodin per day.  He is taking the Cipro and Doxy twice a day, he is on day 7 of the 10 day course he was discharged from the hospital with.  The biggest concern right now is the swelling of his leg, it was 57 cm on Monday, and is still swollen.  He has tried to elevate it some, but is not wearing any compression.   The patient states he is feeling better enough to go back to work.  He is a [employer], and he understands he cannot take vicodin when driving.  He feels that he is able to do his job.   Past Medical History  Diagnosis Date  . Sickle cell trait   . Normal nuclear stress test 07/2010    low prob of ischemia, EF 42%  . Echocardiogram abnormal     moderate LVH, mild LV hypokenesis, EF 42%  . History of Doppler ultrasound 07/2010    negative for DVT  . Abnormal CT of the abdomen     mild L hydronephrosis and hydroureter  . 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 Diabetes mellitus   . Hypertension    Family History  Problem Relation Age of Onset  . Sickle cell trait    . Heart failure Father   . Diabetes Father   . Diabetes Mother   . Hypertension Mother    History  Substance Use Topics  . Smoking status: Current Every Day Smoker    Types: Cigars  . Smokeless tobacco: Never Used   Comment: 2 cigars  . Alcohol Use: No    Review of Systems See HPI    Objective:   Physical Exam  BP 151/94  Pulse 101  Temp 98.8 F (37.1 C)  (Oral)  Ht 6\' 8"  (2.032 m)  Wt 326 lb (147.873 kg)  BMI 35.81 kg/m2 General appearance: alert, cooperative and no distress Right Leg: 1+ pitting edema of lower leg, there is induration of medial right thigh, but no fluctuance.  No erythema, and mild tenderness.  Calf measures 55.5 cm.       Assessment & Plan:

## 2012-01-29 NOTE — Assessment & Plan Note (Signed)
Refill flexeril.

## 2012-01-29 NOTE — Assessment & Plan Note (Signed)
Refill metformin and glipzide Last A1c 7.5. Pt has recently made lifestyle and dietary changes Continue durrent regimen

## 2012-01-29 NOTE — Assessment & Plan Note (Signed)
Resolving infection and edema. Vicodin until symptoms resolve

## 2012-01-29 NOTE — Patient Instructions (Signed)
It was nice to meet you.  Please start taking aleve twice a day to help with pain.  Do not drive after taking the Vicodin.    I have sent 4 more days worth of your antibiotics to the pharmacy, please take each medication twice a day.  Also, try the compression stockings on your leg to help get the swelling out. Please make an appointment to see Ray Hall in one week to check on how you are doing.

## 2012-01-29 NOTE — Progress Notes (Signed)
  Subjective:    Patient ID: Ray Hall, male    DOB: October 05, 1960, 51 y.o.   MRN: [id number]  HPI Cc: Leg pain / hosp f/u  Leg pain: DC from hospital 6 days ago. Still feels week but improved since DC. R leg size is decreasing in size. Still on Doxy and Cipro. Denies fever, rash, HA, n/v/d/c. Only painful area in inner thigh just above the knee feels like it is coming to a head but no purulent discharge. Some descquimation of the skin.  HTN: Pt out of lisinopril for several days. Denies palptiations, CP, HA, syncope  DM: BS at home 120-150. Compliaint w/ metformin and glyburide. Denies s/s of hypo/hyperglycemia  Past medical adn surgical history reviewed  Review of Systems Per hpi    Objective:   Physical Exam  Gen: NAD CV: RRR no mr/g/ Res: CTAB, nromal effort Skin: RLE warm to touch and erythematous. Area of descquimation approximately 2x3 cm of inner thigh proximal to the knee. No purulent DC Extremities: RLE measures 57cm at time of visit. Marked for f/u measurements. Non-painful to palpation      Assessment & Plan:

## 2012-01-29 NOTE — Assessment & Plan Note (Signed)
Improving, but will add 4 more days of antibiotics as the swelling is still impressive.  He has been ruled out for DVT.  He will also try compression stockings for the swelling.

## 2012-01-29 NOTE — Assessment & Plan Note (Signed)
Elevated today. Likely secondary to not having BP med and feeling poorly Refill lisinopril

## 2012-02-05 ENCOUNTER — Encounter: Payer: Self-pay | Admitting: Family Medicine

## 2012-02-05 ENCOUNTER — Ambulatory Visit (INDEPENDENT_AMBULATORY_CARE_PROVIDER_SITE_OTHER): Payer: Self-pay | Admitting: Family Medicine

## 2012-02-05 ENCOUNTER — Telehealth: Payer: Self-pay | Admitting: *Deleted

## 2012-02-05 VITALS — BP 133/82 | HR 102 | Temp 98.5°F | Wt 330.0 lb

## 2012-02-05 DIAGNOSIS — M79604 Pain in right leg: Secondary | ICD-10-CM

## 2012-02-05 DIAGNOSIS — L03119 Cellulitis of unspecified part of limb: Secondary | ICD-10-CM

## 2012-02-05 DIAGNOSIS — L03115 Cellulitis of right lower limb: Secondary | ICD-10-CM

## 2012-02-05 DIAGNOSIS — M79609 Pain in unspecified limb: Secondary | ICD-10-CM

## 2012-02-05 MED ORDER — HYDROCODONE-ACETAMINOPHEN 10-500 MG PO TABS: 1.0000 | ORAL_TABLET | Freq: Three times a day (TID) | ORAL | Status: DC | PRN

## 2012-02-05 MED ORDER — SULFAMETHOXAZOLE-TRIMETHOPRIM 800-160 MG PO TABS: 1.0000 | ORAL_TABLET | Freq: Two times a day (BID) | ORAL | Status: DC

## 2012-02-05 NOTE — Assessment & Plan Note (Signed)
Seems that abscess has now formed- concerning because of proximity to knee and continued infection after about 4 weeks of antibiotics.  He is afebrile and well appearing.  Will change to Bactrim DS, advised warm compresses.  Will refer to Ortho (Dr. Rennis Chris of [facility] saw him in the hospital) for evaluation and management.  Patient voices understanding, knows red flags for which to return to care.

## 2012-02-05 NOTE — Telephone Encounter (Signed)
Called patient left message on voicemail for him to call back. Orthopedist requires $250 up front, need to know if patient is willing to pay this before referral is made.

## 2012-02-05 NOTE — Assessment & Plan Note (Signed)
Will refill hydrocodone #20 tabs for pain, again reminded him cannot take when he has to drive.

## 2012-02-05 NOTE — Progress Notes (Signed)
  Subjective:    Patient ID: Ray Hall, male    DOB: 1960/09/09, 51 y.o.   MRN: [id number]  HPI  Ray Hall returns for follow up of his right lower extremity cellulitis.  He says he is not doing much better.  Since seeing me in clinic one week ago, he has continued Doxy and Cipro BID, with minimal improvement.  He says that now the area on the side of his knee has come to a head and started draining.  His leg seems more swollen, and he is still having significant pain.  He denies any fevers or chills.    Review of Systems See HPI    Objective:   Physical Exam BP 133/82  Pulse 102  Temp 98.5 F (36.9 C) (Oral)  Wt 330 lb (149.687 kg) General appearance: alert, cooperative and no distress Right Leg: on medial side of knee there is a hypopigmented area with some open skin and drainage, there is some fluctuance.  There is minimal surrounding erythema, no induration.  His calf measures 58 cm today, (up from 55.5 one week ago).         Assessment & Plan:

## 2012-02-05 NOTE — Patient Instructions (Addendum)
Please be sure to take your antibiotic twice a day every day.  Please place warm compresses over the draining area twice a day.  I have also prescribed more pain medication, but remember you can not take it while you drive.   The office will call you about scheduling an appointment at orthopedics- I am concerned that the infection could involve your bone or knee joint so I think it is very important for you to see them.

## 2012-02-06 ENCOUNTER — Telehealth: Payer: Self-pay | Admitting: Family Medicine

## 2012-02-06 ENCOUNTER — Telehealth: Payer: Self-pay | Admitting: *Deleted

## 2012-02-06 NOTE — Telephone Encounter (Signed)
Left another message on voicemail for patient to call back. 

## 2012-02-06 NOTE — Telephone Encounter (Signed)
Left another message for patient to call back 

## 2012-02-06 NOTE — Telephone Encounter (Signed)
ERROR

## 2012-02-06 NOTE — Telephone Encounter (Signed)
Spoke with patient and her says he cannot pay that much up front and that it will have to wait until his Medicaid becomes active. FYI to MD

## 2012-02-06 NOTE — Telephone Encounter (Signed)
Called patient after speaking with clinic staff- I referred Ray Hall to Orthopedics yesterday due to concern for not improving leg cellulitis, and formation of abscess with minor drainage.  He has no insurance, and [facility] would require $250 up front.  He says he cannot afford that right now so he is not going to go.  I called him to make sure he understands the risk he is taking- that the infection could be in his knee joint or in the bone, and that could be a debilitating and limb-threatening infection.  He says he understands but can't afford to go.

## 2012-02-12 ENCOUNTER — Other Ambulatory Visit: Payer: Self-pay | Admitting: Family Medicine

## 2012-02-13 ENCOUNTER — Ambulatory Visit: Payer: Self-pay | Admitting: Family Medicine

## 2012-02-13 NOTE — Telephone Encounter (Signed)
Spoke to pt by phone. Cellulitis and leg swelling improving In [place] at this time and unable to come for scheduled appt. Will call to reschedule

## 2012-02-25 ENCOUNTER — Ambulatory Visit: Payer: Self-pay | Admitting: Family Medicine

## 2012-02-28 ENCOUNTER — Ambulatory Visit: Payer: Self-pay | Admitting: Family Medicine

## 2012-08-11 ENCOUNTER — Other Ambulatory Visit: Payer: Self-pay | Admitting: *Deleted

## 2012-08-11 DIAGNOSIS — E119 Type 2 diabetes mellitus without complications: Secondary | ICD-10-CM

## 2012-08-11 MED ORDER — FUROSEMIDE 40 MG PO TABS: 40.0000 mg | ORAL_TABLET | Freq: Two times a day (BID) | ORAL | Status: DC

## 2012-08-31 ENCOUNTER — Other Ambulatory Visit: Payer: Self-pay | Admitting: *Deleted

## 2012-09-01 ENCOUNTER — Telehealth: Payer: Self-pay | Admitting: Family Medicine

## 2012-09-01 MED ORDER — GLIPIZIDE 10 MG PO TABS: 10.0000 mg | ORAL_TABLET | Freq: Two times a day (BID) | ORAL | Status: DC

## 2012-09-01 MED ORDER — METFORMIN HCL 1000 MG PO TABS: 1000.0000 mg | ORAL_TABLET | Freq: Two times a day (BID) | ORAL | Status: DC

## 2012-09-01 NOTE — Telephone Encounter (Signed)
Pt is needing his metformin and pharmacy has told her they have faxed it twice.  He is now out of his meds.  [facility] [place]

## 2012-09-02 MED ORDER — METFORMIN HCL 1000 MG PO TABS: 1000.0000 mg | ORAL_TABLET | Freq: Two times a day (BID) | ORAL | Status: DC

## 2012-09-02 NOTE — Telephone Encounter (Signed)
Metformin sent.

## 2012-09-02 NOTE — Addendum Note (Signed)
Addended by: Konrad Dolores, Rondell Pardon J on: 09/02/2012 12:19 AM   Modules accepted: Orders

## 2012-09-25 ENCOUNTER — Ambulatory Visit: Payer: Self-pay | Admitting: Family Medicine

## 2012-11-05 ENCOUNTER — Other Ambulatory Visit: Payer: Self-pay

## 2012-11-06 ENCOUNTER — Ambulatory Visit (INDEPENDENT_AMBULATORY_CARE_PROVIDER_SITE_OTHER): Payer: Self-pay | Admitting: Family Medicine

## 2012-11-06 ENCOUNTER — Encounter: Payer: Self-pay | Admitting: Family Medicine

## 2012-11-06 VITALS — BP 127/77 | HR 99 | Temp 98.7°F | Ht >= 80 in | Wt 314.0 lb

## 2012-11-06 DIAGNOSIS — S4991XA Unspecified injury of right shoulder and upper arm, initial encounter: Secondary | ICD-10-CM

## 2012-11-06 DIAGNOSIS — N529 Male erectile dysfunction, unspecified: Secondary | ICD-10-CM

## 2012-11-06 DIAGNOSIS — I4891 Unspecified atrial fibrillation: Secondary | ICD-10-CM

## 2012-11-06 DIAGNOSIS — S4990XA Unspecified injury of shoulder and upper arm, unspecified arm, initial encounter: Secondary | ICD-10-CM | POA: Insufficient documentation

## 2012-11-06 DIAGNOSIS — I480 Paroxysmal atrial fibrillation: Secondary | ICD-10-CM

## 2012-11-06 DIAGNOSIS — S4980XA Other specified injuries of shoulder and upper arm, unspecified arm, initial encounter: Secondary | ICD-10-CM

## 2012-11-06 DIAGNOSIS — I10 Essential (primary) hypertension: Secondary | ICD-10-CM

## 2012-11-06 DIAGNOSIS — E119 Type 2 diabetes mellitus without complications: Secondary | ICD-10-CM

## 2012-11-06 LAB — POCT GLYCOSYLATED HEMOGLOBIN (HGB A1C): Hemoglobin A1C: 6.8

## 2012-11-06 MED ORDER — SILDENAFIL CITRATE 25 MG PO TABS: 25.0000 mg | ORAL_TABLET | Freq: Every day | ORAL | Status: DC | PRN

## 2012-11-06 MED ORDER — METHYLPREDNISOLONE ACETATE 40 MG/ML IJ SUSP
40.0000 mg | Freq: Once | INTRAMUSCULAR | Status: AC
  Administered 2012-11-06: 40 mg via INTRA_ARTICULAR

## 2012-11-06 NOTE — Assessment & Plan Note (Signed)
Subacromial injury/bursitis/tendonitis. 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

## 2012-11-06 NOTE — Patient Instructions (Addendum)
Thank you for coming in today You are doing great Please continue to try to cut your weight, cut back on smoking and exercise more. All of these things will help you with your overall health and diabetes as well as your erectile dysfunction I have sent viagra over to the pharmacy for you to try Please come back in 2 weeks for follow-up for your shoulder and to discuss yoru knee pain and toenail issues.  Have a great day.   Acromioclavicular Separation with Rehab The acromioclavicular joint is the joint between the roof of the shoulder (acromion) and the collarbone (clavicle). It is vulnerable to injury. An acromioclavicular [facility]) separation is a partial or complete tear (sprain), injury, or redness and soreness (inflammation) of the ligaments that cross the acromioclavicular joint and hold it in place. There are two ligaments in this area that are vulnerable to injury, the acromioclavicular ligament and the coracoclavicular ligament. SYMPTOMS   Tenderness and swelling, or a bump on top of the shoulder (at the [facility] joint).  Bruising (contusion) in the area within 48 hours of injury.  Loss of strength or pain when reaching over the head or across the body. CAUSES  AC separation is caused by direct trauma to the joint (falling on your shoulder) or indirect trauma (falling on an outstretched arm). RISK INCREASES WITH:  Sports that require contact or collision, throwing sports (i.e. racquetball, squash).  Poor strength and flexibility.  Previous shoulder sprain or dislocation.  Poorly fitted or padded protective equipment. PREVENTION   Warm-up and stretch properly before activity.  Maintain physical fitness:  Shoulder strength.  Shoulder flexibility.  Cardiovascular fitness.  Wear properly fitted and padded protective equipment.  Learn and use proper technique when playing sports. Have a coach correct improper technique, including falling and landing.  Apply taping, protective  strapping or padding, or an adhesive bandage as recommended before practice or competition. PROGNOSIS   If treated properly, the symptoms of AC separation can be expected to go away.  If treated improperly, permanent disability may occur unless surgery is performed.  Healing time varies with type of sport and position, arm injured (dominant versus non-dominant) and severity of sprain. RELATED COMPLICATIONS  Weakness and fatigue of the arm or shoulder are possible but uncommon.  Pain and inflammation of the [facility] joint may continue.  Prolonged healing time may be necessary if usual activities are resumed too early. This causes a susceptibility to recurrent injury.  Prolonged disability may occur.  The shoulder may remain unstable or arthritic following repeated injury. TREATMENT  Treatment initially involves ice and medication to help reduce pain and inflammation. It may also be necessary to modify your activities in order to prevent further injury. Both non-surgical and surgical interventions exist to treat AC separation. Non-surgical intervention is usually recommended and involves wearing a sling to immobilize the joint for a period of time to allow for healing. Surgical intervention is usually only considered for severe sprains of the ligament or for individuals who do not improve after 2 to 6 months of non-surgical treatment. Surgical interventions require 4 to 6 months before a return to sports is possible. MEDICATION  If pain medication is necessary, nonsteroidal anti-inflammatory medications, such as aspirin and ibuprofen, or other minor pain relievers, such as acetaminophen, are often recommended.  Do not take pain medication for 7 days before surgery.  Prescription pain relievers may be given by your caregiver. Use only as directed and only as much as you need.  Ointments applied  to the skin may be helpful.  Corticosteroid injections may be given to reduce inflammation. HEAT  AND COLD  Cold treatment (icing) relieves pain and reduces inflammation. Cold treatment should be applied for 10 to 15 minutes every 2 to 3 hours for inflammation and pain and immediately after any activity that aggravates your symptoms. Use ice packs or an ice massage.  Heat treatment may be used prior to performing the stretching and strengthening activities prescribed by your caregiver, physical therapist or athletic trainer. Use a heat pack or a warm soak. SEEK IMMEDIATE MEDICAL CARE IF:   Pain, swelling or bruising worsens despite treatment.  There is pain, numbness or coldness in the arm.  Discoloration appears in the fingernails.  New, unexplained symptoms develop. EXERCISES  RANGE OF MOTION (ROM) AND STRETCHING EXERCISES  Acromioclavicular Separation These exercises may help you when beginning to rehabilitate your injury. Your symptoms may resolve with or without further involvement from your physician, physical therapist or athletic trainer. While completing these exercises, remember:  Restoring tissue flexibility helps normal motion to return to the joints. This allows healthier, less painful movement and activity.  An effective stretch should be held for at least 30 seconds.  A stretch should never be painful. You should only feel a gentle lengthening or release in the stretched tissue. ROM Pendulum  Bend at the waist so that your right / left arm falls away from your body. Support yourself with your opposite hand on a solid surface, such as a table or a countertop.  Your right / left arm should be perpendicular to the ground. If it is not perpendicular, you need to lean over farther. Relax the muscles in your right / left arm and shoulder as much as possible.  Gently sway your hips and trunk so they move your right / left arm without any use of your right / left shoulder muscles.  Progress your movements so that your right / left arm moves side to side, then forward and  backward, and finally, both clockwise and counterclockwise.  Complete __________ repetitions in each direction. Many people use this exercise to relieve discomfort in their shoulder as well as to gain range of motion. Repeat __________ times. Complete this exercise __________ times per day. STRETCH  Flexion, Seated   Sit in a firm chair so that your right / left forearm can rest on a table or countertop. Your right / left elbow should rest below the height of your shoulder so that your shoulder feels supported and not tense or uncomfortable.  Keeping your right / left shoulder relaxed, lean forward at your waist, allowing your right / left hand to slide forward. Bend forward until you feel a moderate stretch in your shoulder, but before you feel an increase in your pain.  Hold __________ seconds. Slowly return to your starting position. Repeat __________ times. Complete this exercise __________ times per day. STRETCH  Flexion, Standing  Stand with good posture. With an underhand grip on your right / left and an overhand grip on the opposite hand, grasp a broomstick or cane so that your hands are a little more than shoulder-width apart.  Keeping your right / left elbow straight and shoulder muscles relaxed, push the stick with your opposite hand to raise your right / left arm in front of your body and then overhead. Raise your arm until you feel a stretch in your right / left shoulder, but before you have increased shoulder pain.  Try to avoid shrugging  your right / left shoulder as your arm rises by keeping your shoulder blade tucked down and toward your mid-back spine. Hold __________ seconds.  Slowly return to the starting position. Repeat __________ times. Complete this exercise __________ times per day. STRENGTHENING EXERCISES  Acromioclavicular Separation These exercises may help you when beginning to rehabilitate your injury. They may resolve your symptoms with or without further  involvement from your physician, physical therapist or athletic trainer. While completing these exercises, remember:  Muscles can gain both the endurance and the strength needed for everyday activities through controlled exercises.  Complete these exercises as instructed by your physician, physical therapist or athletic trainer. Progress the resistance and repetitions only as guided.  You may experience muscle soreness or fatigue, but the pain or discomfort you are trying to eliminate should never worsen during these exercises. If this pain does worsen, stop and make certain you are following the directions exactly. If the pain is still present after adjustments, discontinue the exercise until you can discuss the trouble with your clinician. STRENGTH Shoulder Abductors, Isometric   With good posture, stand or sit about 4-6 inches from a wall with your right / left side facing the wall.  Bend your right / left elbow. Gently press your right / left elbow into the wall. Increase the pressure gradually until you are pressing as hard as you can without shrugging your shoulder or increasing any shoulder discomfort.  Hold __________ seconds.  Release the tension slowly. Relax your shoulder muscles completely before you start the next repetition. Repeat __________ times. Complete this exercise __________ times per day. STRENGTH  Internal Rotators, Isometric  Keep your right / left elbow at your side and bend it 90 degrees.  Step into a door frame so that the inside of your right / left wrist can press against the door frame without your upper arm leaving your side.  Gently press your right / left wrist into the door frame as if you were trying to draw the palm of your hand to your abdomen. Gradually increase the tension until you are pressing as hard as you can without shrugging your shoulder or increasing any shoulder discomfort.  Hold __________ seconds.  Release the tension slowly. Relax your  shoulder muscles completely before you the next repetition. Repeat __________ times. Complete this exercise __________ times per day.  STRENGTH  External Rotators, Isometric  Keep your right / left elbow at your side and bend it 90 degrees.  Step into a door frame so that the outside of your right / left wrist can press against the door frame without your upper arm leaving your side.  Gently press your right / left wrist into the door frame as if you were trying to swing the back of your hand away from your abdomen. Gradually increase the tension until you are pressing as hard as you can without shrugging your shoulder or increasing any shoulder discomfort.  Hold __________ seconds.  Release the tension slowly. Relax your shoulder muscles completely before you the next repetition. Repeat __________ times. Complete this exercise __________ times per day. STRENGTH  Internal Rotators  Secure a rubber exercise band/tubing to a fixed object so that it is at the same height as your right / left elbow when you are standing or sitting on a firm surface.  Stand or sit so that the secured exercise band/tubing is at your right / left side.  Bend your elbow 90 degrees. Place a folded towel or small pillow  under your right / left arm so that your elbow is a few inches away from your side.  Keeping the tension on the exercise band/tubing, pull it across your body toward your abdomen. Be sure to keep your body steady so that the movement is only coming from your shoulder rotating.  Hold __________ seconds. Release the tension in a controlled manner as you return to the starting position. Repeat __________ times. Complete this exercise __________ times per day. STRENGTH  External Rotators  Secure a rubber exercise band/tubing to a fixed object so that it is at the same height as your right / left elbow when you are standing or sitting on a firm surface.  Stand or sit so that the secured exercise  band/tubing is at your side that is not injured.  Bend your elbow 90 degrees. Place a folded towel or small pillow under your right / left arm so that your elbow is a few inches away from your side.  Keeping the tension on the exercise band/tubing, pull it away from your body, as if pivoting on your elbow. Be sure to keep your body steady so that the movement is only coming from your shoulder rotating.  Hold __________ seconds. Release the tension in a controlled manner as you return to the starting position. Repeat __________ times. Complete this exercise __________ times per day. Document Released: 04/15/2005 Document Revised: 07/08/2011 Document Reviewed: 07/28/2008 [facility] Patient Information 2014 [place], [place].

## 2012-11-06 NOTE — Assessment & Plan Note (Signed)
Counseled pt on trying to decrease wt, stop smoking, control DM, and to get more exercise Will trial viagra but no insurance so likely wont be able to afford

## 2012-11-06 NOTE — Progress Notes (Signed)
Ray Hall is a 52 y.o. male who presents to [facility] today for f/u  DM: taking glipizide and metformin. A1c improving. Changed diet. Decreased fried foods adn eating more fruits and vegetables. Decreased portion sizes. Wt down 17lbs today. No further ulcerations of foot or leg. Changed work Equities trader. Will have new insurance and will set up appt for optho. Minimal visual changes  HTN and h/o atrial flutter: taking lisinopril and dilt. Denies CP, SOB, palpitations, syncope  R Should pain: pinned by forklift  1 mo ago while unloading truck. Feels like shoulder is dislocated. Worse w/ movement or laying on R side. Sometimes sharp and sometimes achy. Several NSAIDs forms w/o relief.   ED: good sex drive. Unable to maintain erection and sometimes unable to get erection. Pt uses very little caffeine. 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   The following portions of the patient's history were reviewed and updated as appropriate: allergies, current medications, past medical history, family and social history, and problem list.  Patient is a 3-4 cigars daily smoker  Past Medical History  Diagnosis Date  . Sickle cell trait   . Normal nuclear stress test 07/2010    low prob of ischemia, EF 42%  . Echocardiogram abnormal     moderate LVH, mild LV hypokenesis, EF 42%  . History of Doppler ultrasound 07/2010    negative for DVT  . Abnormal CT of the abdomen     mild L hydronephrosis and hydroureter  . 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 Diabetes mellitus   . Hypertension     ROS as above otherwise neg.    Medications reviewed. Current Outpatient Prescriptions  Medication Sig Dispense Refill  . cyclobenzaprine (FLEXERIL) 10 MG tablet Take 1 tablet (10 mg total) by mouth 3 (three) times daily as needed. For muscle spasms.  30 tablet  3  . diltiazem (CARDIZEM) 60 MG tablet Take 60 mg by mouth 2 (two) times daily.      .  furosemide (LASIX) 40 MG tablet Take 1 tablet (40 mg total) by mouth 2 (two) times daily.  60 tablet  2  . glipiZIDE (GLUCOTROL) 10 MG tablet Take 1 tablet (10 mg total) by mouth 2 (two) times daily before a meal.  60 tablet  3  . lisinopril (PRINIVIL,ZESTRIL) 20 MG tablet Take 1 tablet (20 mg total) by mouth daily.  30 tablet  3  . metFORMIN (GLUCOPHAGE) 1000 MG tablet Take 1 tablet (1,000 mg total) by mouth 2 (two) times daily with a meal.  60 tablet  3  . Multiple Vitamin (MULTIVITAMIN WITH MINERALS) TABS Take 1 tablet by mouth daily.       No current facility-administered medications for this visit.    Exam:  BP 127/77  Pulse 99  Temp(Src) 98.7 F (37.1 C) (Oral)  Ht 6\' 8"  (2.032 m)  Wt 314 lb (142.429 kg)  BMI 34.49 kg/m2 Gen: Well NAD HEENT: EOMI,  MMM Lungs:  Nl WOB Heart: RRR Abd: ND, obese MSK: limited ROM, TTP of acromioclavicular joint. Hawkings positive, weakness on empty can, unable to place R hand behind back. No bony abnormality, no crepitus.   Results for orders placed in visit on 11/06/12 (from the past 72 hour(s))  POCT GLYCOSYLATED HEMOGLOBIN (HGB A1C)     Status: None   Collection Time    11/06/12 10:42 AM      Result Value Range  Hemoglobin A1C 6.8      A R shoulder subacromial steroid injection was performed using 3cc 1% plain Lidocaine and 40 mg of Depomedrol. The area was prepped and cleaned and a posterior approach was used. This was well tolerated.

## 2012-11-06 NOTE — Assessment & Plan Note (Signed)
No known exacerbations since ablation in 2012

## 2012-11-06 NOTE — Assessment & Plan Note (Signed)
Well-controlled.  Continue current regimen. 

## 2012-11-06 NOTE — Assessment & Plan Note (Signed)
A1c improving Continue metformin and glipizide Continue wt loss and dieting

## 2012-11-23 ENCOUNTER — Encounter: Payer: Self-pay | Admitting: Family Medicine

## 2012-11-23 ENCOUNTER — Ambulatory Visit (INDEPENDENT_AMBULATORY_CARE_PROVIDER_SITE_OTHER): Payer: Self-pay | Admitting: Family Medicine

## 2012-11-23 VITALS — BP 132/80 | HR 97 | Temp 98.4°F | Ht >= 80 in | Wt 315.0 lb

## 2012-11-23 DIAGNOSIS — Z5189 Encounter for other specified aftercare: Secondary | ICD-10-CM

## 2012-11-23 DIAGNOSIS — S4991XD Unspecified injury of right shoulder and upper arm, subsequent encounter: Secondary | ICD-10-CM

## 2012-11-23 MED ORDER — LISINOPRIL 20 MG PO TABS: 20.0000 mg | ORAL_TABLET | Freq: Every day | ORAL | Status: DC

## 2012-11-23 MED ORDER — HYDROCODONE-ACETAMINOPHEN 5-325 MG PO TABS: 1.0000 | ORAL_TABLET | Freq: Four times a day (QID) | ORAL | Status: DC | PRN

## 2012-11-23 MED ORDER — DICLOFENAC SODIUM 1 % TD GEL: 2.0000 g | Freq: Four times a day (QID) | TRANSDERMAL | Status: DC | PRN

## 2012-11-23 NOTE — Assessment & Plan Note (Signed)
Concern for rotator cuff tear vs tendonopathy. Xray to look for arthritic changes Will likely need further imaging ([place] vs MRI) to workup.  Will referr to [facility] for eval and treatment Still painful despite NSAIDs, exercises and injection Doclofenac gel.  Vicodin PRN WHEN NOT DRIVING Cont exercises

## 2012-11-23 NOTE — Patient Instructions (Addendum)
Please continue doing your daily exercises Please only use the vicodin when you are not driving Please use the voltaren gel for relief of your shoulder pain Please follow up with sports medicine when your insurance kicks in Please go to [facility] for an xray Please only take a max of 2 aleve every 8 hours or ibpuprofen 600mg  every 6 hours.

## 2012-11-23 NOTE — Progress Notes (Signed)
Ray Hall is a 52 y.o. male who presents to [facility] today for f/u for shoulder pain  Shoulder pain: increased after injection for 3 days. Then improved to baseline. Feels like something is loose in shoulder. 4-5 Advil and aleve w/ some relief. Pain primarily w/ gear shifting down lateral arm. Also associated w/ numbness and tingling. Used tramadol from friend w/o relief  HTN: taking lisinopril and dilt.    The following portions of the patient's history were reviewed and updated as appropriate: allergies, current medications, past medical history, family and social history, and problem list.  Patient is a smoker  Past Medical History  Diagnosis Date  . Sickle cell trait   . Normal nuclear stress test 07/2010    low prob of ischemia, EF 42%  . Echocardiogram abnormal     moderate LVH, mild LV hypokenesis, EF 42%  . History of Doppler ultrasound 07/2010    negative for DVT  . Abnormal CT of the abdomen     mild L hydronephrosis and hydroureter  . 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 Diabetes mellitus   . Hypertension     ROS as above otherwise neg.    Medications reviewed. Current Outpatient Prescriptions  Medication Sig Dispense Refill  . cyclobenzaprine (FLEXERIL) 10 MG tablet Take 1 tablet (10 mg total) by mouth 3 (three) times daily as needed. For muscle spasms.  30 tablet  3  . diltiazem (CARDIZEM) 60 MG tablet Take 60 mg by mouth 2 (two) times daily.      . furosemide (LASIX) 40 MG tablet Take 1 tablet (40 mg total) by mouth 2 (two) times daily.  60 tablet  2  . glipiZIDE (GLUCOTROL) 10 MG tablet Take 1 tablet (10 mg total) by mouth 2 (two) times daily before a meal.  60 tablet  3  . lisinopril (PRINIVIL,ZESTRIL) 20 MG tablet Take 1 tablet (20 mg total) by mouth daily.  30 tablet  3  . metFORMIN (GLUCOPHAGE) 1000 MG tablet Take 1 tablet (1,000 mg total) by mouth 2 (two) times daily with a meal.  60 tablet  3  . Multiple  Vitamin (MULTIVITAMIN WITH MINERALS) TABS Take 1 tablet by mouth daily.      . sildenafil (VIAGRA) 25 MG tablet Take 1 tablet (25 mg total) by mouth daily as needed for erectile dysfunction.  10 tablet  0   No current facility-administered medications for this visit.    Exam:  BP 132/80  Pulse 97  Temp(Src) 98.4 F (36.9 C) (Oral)  Ht 6\' 8"  (2.032 m)  Wt 315 lb (142.883 kg)  BMI 34.6 kg/m2 Gen: Well NAD HEENT: EOMI,  MMM MSK: + apprehension test. + impingement test. ttp at acromioclavicular joint. MSK: limited ROM, TTP of acromioclavicular joint. Hawkins positive, weakness on empty can, unable to place R hand behind back. No bony abnormality, no crepitus.   No results found for this or any previous visit (from the past 72 hour(s)).

## 2013-02-19 ENCOUNTER — Telehealth: Payer: Self-pay | Admitting: Family Medicine

## 2013-02-19 NOTE — Telephone Encounter (Signed)
Wife called. Husband needs all his medicines refill. He is out of town and will be back today. Pharmacy is [facility] on [address]

## 2013-02-20 ENCOUNTER — Other Ambulatory Visit: Payer: Self-pay | Admitting: Family Medicine

## 2013-02-20 DIAGNOSIS — E119 Type 2 diabetes mellitus without complications: Secondary | ICD-10-CM

## 2013-02-20 DIAGNOSIS — S4991XD Unspecified injury of right shoulder and upper arm, subsequent encounter: Secondary | ICD-10-CM

## 2013-02-20 MED ORDER — CYCLOBENZAPRINE HCL 10 MG PO TABS: 10.0000 mg | ORAL_TABLET | Freq: Three times a day (TID) | ORAL | Status: DC | PRN

## 2013-02-20 MED ORDER — DILTIAZEM HCL 60 MG PO TABS: 60.0000 mg | ORAL_TABLET | Freq: Two times a day (BID) | ORAL | Status: DC

## 2013-02-20 MED ORDER — LISINOPRIL 20 MG PO TABS: 20.0000 mg | ORAL_TABLET | Freq: Every day | ORAL | Status: DC

## 2013-02-20 MED ORDER — DICLOFENAC SODIUM 1 % TD GEL: 2.0000 g | Freq: Four times a day (QID) | TRANSDERMAL | Status: DC | PRN

## 2013-02-20 MED ORDER — GLIPIZIDE 10 MG PO TABS: 10.0000 mg | ORAL_TABLET | Freq: Two times a day (BID) | ORAL | Status: DC

## 2013-02-20 MED ORDER — METFORMIN HCL 1000 MG PO TABS: 1000.0000 mg | ORAL_TABLET | Freq: Two times a day (BID) | ORAL | Status: DC

## 2013-02-20 MED ORDER — FUROSEMIDE 40 MG PO TABS: 40.0000 mg | ORAL_TABLET | Freq: Two times a day (BID) | ORAL | Status: DC

## 2013-02-22 NOTE — Progress Notes (Signed)
Spoke with wife and she is aware of this message . Jazmin Hartsell,CMA

## 2013-02-23 ENCOUNTER — Other Ambulatory Visit: Payer: Self-pay | Admitting: Family Medicine

## 2013-02-23 MED ORDER — CYCLOBENZAPRINE HCL 10 MG PO TABS: 10.0000 mg | ORAL_TABLET | Freq: Three times a day (TID) | ORAL | Status: DC | PRN

## 2013-04-07 ENCOUNTER — Telehealth: Payer: Self-pay | Admitting: Family Medicine

## 2013-04-09 ENCOUNTER — Ambulatory Visit (INDEPENDENT_AMBULATORY_CARE_PROVIDER_SITE_OTHER): Payer: Self-pay | Admitting: Family Medicine

## 2013-04-09 ENCOUNTER — Ambulatory Visit (HOSPITAL_COMMUNITY)
Admission: RE | Admit: 2013-04-09 | Discharge: 2013-04-09 | Disposition: A | Discharge status: Home / Self Care | Payer: Self-pay | Source: Ambulatory Visit | Attending: Family Medicine | Admitting: Family Medicine

## 2013-04-09 ENCOUNTER — Encounter: Payer: Self-pay | Admitting: Family Medicine

## 2013-04-09 VITALS — BP 134/81 | HR 86 | Temp 99.1°F | Ht >= 80 in | Wt 338.0 lb

## 2013-04-09 DIAGNOSIS — M7989 Other specified soft tissue disorders: Secondary | ICD-10-CM

## 2013-04-09 DIAGNOSIS — M545 Low back pain: Secondary | ICD-10-CM

## 2013-04-09 DIAGNOSIS — E119 Type 2 diabetes mellitus without complications: Secondary | ICD-10-CM

## 2013-04-09 DIAGNOSIS — M79609 Pain in unspecified limb: Secondary | ICD-10-CM

## 2013-04-09 LAB — CBC
HCT: 35.3 % — ABNORMAL LOW (ref 39.0–52.0)
Hemoglobin: 12.6 g/dL — ABNORMAL LOW (ref 13.0–17.0)
MCH: 29 pg (ref 26.0–34.0)
MCHC: 35.7 g/dL (ref 30.0–36.0)
MCV: 81.3 fL (ref 78.0–100.0)
RDW: 13 % (ref 11.5–15.5)
WBC: 9.3 10*3/uL (ref 4.0–10.5)

## 2013-04-09 LAB — POCT URINALYSIS DIPSTICK
Blood, UA: NEGATIVE
Ketones, UA: NEGATIVE
Leukocytes, UA: NEGATIVE
Nitrite, UA: NEGATIVE
Protein, UA: NEGATIVE
pH, UA: 6

## 2013-04-09 LAB — COMPREHENSIVE METABOLIC PANEL
ALT: 42 U/L (ref 0–53)
AST: 25 U/L (ref 0–37)
CO2: 26 mEq/L (ref 19–32)
Chloride: 103 mEq/L (ref 96–112)
Sodium: 140 mEq/L (ref 135–145)
Total Bilirubin: 0.5 mg/dL (ref 0.3–1.2)
Total Protein: 7.2 g/dL (ref 6.0–8.3)

## 2013-04-09 MED ORDER — OXYCODONE-ACETAMINOPHEN 5-325 MG PO TABS: 1.0000 | ORAL_TABLET | Freq: Three times a day (TID) | ORAL | Status: DC | PRN

## 2013-04-09 NOTE — Progress Notes (Signed)
  Patient name: Ray Hall MRN [id number]  Date of birth: [birth year]  CC & HPI:  Ray Hall is a 52 y.o. male presenting today for lower back pain and right leg swelling.  He reports being hospitalized in [place] this past weekend for kidney infection and kidney failure.  He was treated with antibiotics for three days and discharged on Tuesday.   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 When he got home he noticed that the swelling in his legs was greater than normal, with his right leg being more swollen than his left.  He reports normal swelling in his legs, but only to his ankles. He reports compliance with his furosemide. He reports pain in his right calf, but no chest pain or shortness of breath. He denies any history of clots before, MI or strokes; he does take an aspirin a day, but is not on a statin.   Lower back pain He describes his back pain as diffuse crampy lumbar pain.  He has experienced this pain for a while, but has recently increased during his last truck back from [place].  He has continued to take his Flexeril with minimal relief. He was prescribed Percocet at a hospital in [place], but was unable to fill it because he crossed [employer] prior to going to the drug store. He denies any current dysuria, or fevers.  He reports compliance with his antibiotics: Cipro. He denies any recent weight loss, numbness/ weakness in his legs, saddle anesthesia, or trauma. He does report recent urinary incontinence.  Review of Systems  Constitutional: Positive for malaise/fatigue. Negative for fever, chills and weight loss.  Respiratory: Negative for cough and shortness of breath.   Cardiovascular: Positive for leg swelling. Negative for chest pain, palpitations, orthopnea and claudication.  Genitourinary: Positive for flank pain. Negative for dysuria and urgency.  Musculoskeletal: Positive for back pain, joint pain and myalgias.    Neurological: Positive for weakness.   Medical & Surgical Hx:  Reviewed.  Medications & Allergies: Reviewed & Updated - see associated section Social History: Reviewed:   Objective Findings:  Vitals: BP 134/81  Pulse 86  Temp(Src) 99.1 F (37.3 C) (Oral)  Ht 6\' 8"  (2.032 m)  Wt 338 lb (153.316 kg)  BMI 37.13 kg/m2  Gen: NAD, Depressed affect CV: RRR w/o m/r/g, pulses +2 b/l Resp: CTAB w/ normal respiratory effort Back: Normal skin w/o rash; No tenderness to vertebral process palpation.  Paraspinous muscles are very tender to light palpation. Straight leg and cross leg test exacerbate LBP w/o producing sciatic pain Lower Ext: 2+ pitting edema bilaterally (right>  Left) to his knees; chronic skin changes consistent with vascular insufficiency (hair loss, darkening of skin): positive calf pain and Hoffman sign on the right; and no palpable cords   Assessment & Plan:   Please See Problem Focused Assessment & Plan

## 2013-04-09 NOTE — Assessment & Plan Note (Signed)
Pertinent S&O  LE swelling Right  > left; + Hoffman's; No Hx of DVT, MI, Stroke; Hx of chronic LE swelling currently on Furosemide Assessment  Venous Doppler neg  DDx: nephropathy vs anemia  Plan  Check CBC, CMET  Continue furosemide and elevate legs  Reassess at PCP visit next week

## 2013-04-09 NOTE — Progress Notes (Addendum)
*  PRELIMINARY RESULTS* Vascular Ultrasound Right lower extremity venous duplex has been completed.  Preliminary findings: no evidence of DVT in visualized veins of right leg. Enlarged right inguinal lymph node noted.  Called report to Dr. Whitman Hero pager [phone] at 4:08pm    Farrel Demark, RDMS, RVT  04/09/2013, 4:10 PM

## 2013-04-09 NOTE — Assessment & Plan Note (Signed)
Pertinent S&O  Recent hospitalization for UTI and "kidney failure" in [place]; + CVA tenderness; afebrile; currently on Cipro Assessment  Exacerbation of lumbar strain versus pyelonephritis  CBC, CMET and UA today Plan  Continue Flexeril  Percocet given

## 2013-04-09 NOTE — Patient Instructions (Signed)
It was great seeing you today.   1. I will call you with the results of you blood work   Please bring all your medications to every doctors visit  Sign up for My Chart to have easy access to your labs results, and communication with your Primary care physician.  Next Appointment  Make apt with PCP ASAP   I look forward to talking with you again at our next visit. If you have any questions or concerns before then, please call the clinic at [phone].  Take Care,   Dr Wenda Low

## 2013-04-14 ENCOUNTER — Encounter: Payer: Self-pay | Admitting: Family Medicine

## 2013-04-14 ENCOUNTER — Encounter: Payer: Self-pay | Admitting: *Deleted

## 2013-04-14 ENCOUNTER — Ambulatory Visit (INDEPENDENT_AMBULATORY_CARE_PROVIDER_SITE_OTHER): Payer: Self-pay | Admitting: Family Medicine

## 2013-04-14 VITALS — BP 153/85 | HR 94 | Temp 99.3°F | Wt 335.0 lb

## 2013-04-14 DIAGNOSIS — M7989 Other specified soft tissue disorders: Secondary | ICD-10-CM

## 2013-04-14 DIAGNOSIS — M545 Low back pain: Secondary | ICD-10-CM

## 2013-04-14 DIAGNOSIS — I10 Essential (primary) hypertension: Secondary | ICD-10-CM

## 2013-04-14 DIAGNOSIS — E119 Type 2 diabetes mellitus without complications: Secondary | ICD-10-CM

## 2013-04-14 MED ORDER — OXYCODONE-ACETAMINOPHEN 5-325 MG PO TABS: 1.0000 | ORAL_TABLET | Freq: Three times a day (TID) | ORAL | Status: DC | PRN

## 2013-04-14 MED ORDER — CYCLOBENZAPRINE HCL 10 MG PO TABS: 10.0000 mg | ORAL_TABLET | Freq: Three times a day (TID) | ORAL | Status: DC | PRN

## 2013-04-14 MED ORDER — METFORMIN HCL 1000 MG PO TABS: 1000.0000 mg | ORAL_TABLET | Freq: Two times a day (BID) | ORAL | Status: DC

## 2013-04-14 MED ORDER — FUROSEMIDE 40 MG PO TABS: 40.0000 mg | ORAL_TABLET | Freq: Every day | ORAL | Status: DC

## 2013-04-14 MED ORDER — LISINOPRIL 20 MG PO TABS: 20.0000 mg | ORAL_TABLET | Freq: Every day | ORAL | Status: DC

## 2013-04-14 MED ORDER — TRAMADOL HCL 50 MG PO TABS: 50.0000 mg | ORAL_TABLET | Freq: Three times a day (TID) | ORAL | Status: DC | PRN

## 2013-04-14 MED ORDER — GABAPENTIN 100 MG PO CAPS: 100.0000 mg | ORAL_CAPSULE | Freq: Three times a day (TID) | ORAL | Status: DC

## 2013-04-14 NOTE — Progress Notes (Signed)
Ray Hall is a 52 y.o. male who presents to [facility] today for back pain  Back pain: started greater than 2 years ago. H/o 3 disc rupture from fall. Benefit w/ flexeril. Benefit w/ percocet. 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 No percocet or vicodin until resting at night. Tramadol w/o benefit. Ibuprofen 800 w;/o benefit. Already seen by ortho.    Leg swellling: much reduced. Continues fluid Qday pills and leg elevation   UTI: on Cipro. Denies dysuria.   HTN: Denies CP, palpitations, and SOB.   The following portions of the patient's history were reviewed and updated as appropriate: allergies, current medications, past medical history, family and social history, and problem list.  Patient is a smoker.   Past Medical History  Diagnosis Date  . Sickle cell trait   . Normal nuclear stress test 07/2010    low prob of ischemia, EF 42%  . Echocardiogram abnormal     moderate LVH, mild LV hypokenesis, EF 42%  . History of Doppler ultrasound 07/2010    negative for DVT  . Abnormal CT of the abdomen     mild L hydronephrosis and hydroureter  . 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 Diabetes mellitus   . Hypertension     ROS as above otherwise neg.    Medications reviewed. Current Outpatient Prescriptions  Medication Sig Dispense Refill  . cyclobenzaprine (FLEXERIL) 10 MG tablet Take 1 tablet (10 mg total) by mouth 3 (three) times daily as needed. For muscle spasms.  90 tablet  6  . diclofenac sodium (VOLTAREN) 1 % GEL Apply 2 g topically 4 (four) times daily as needed.  100 g  1  . diltiazem (CARDIZEM) 60 MG tablet Take 1 tablet (60 mg total) by mouth 2 (two) times daily.  60 tablet  3  . furosemide (LASIX) 40 MG tablet Take 1 tablet (40 mg total) by mouth daily.  90 tablet  3  . gabapentin (NEURONTIN) 100 MG capsule Take 1 capsule (100 mg total) by mouth 3 (three) times daily.  90 capsule  3  . glipiZIDE (GLUCOTROL) 10 MG  tablet Take 1 tablet (10 mg total) by mouth 2 (two) times daily before a meal.  60 tablet  3  . lisinopril (PRINIVIL,ZESTRIL) 20 MG tablet Take 1 tablet (20 mg total) by mouth daily.  90 tablet  3  . metFORMIN (GLUCOPHAGE) 1000 MG tablet Take 1 tablet (1,000 mg total) by mouth 2 (two) times daily with a meal.  180 tablet  3  . Multiple Vitamin (MULTIVITAMIN WITH MINERALS) TABS Take 1 tablet by mouth daily.      Marland Kitchen oxyCODONE-acetaminophen (ROXICET) 5-325 MG per tablet Take 1 tablet by mouth every 8 (eight) hours as needed for severe pain.  30 tablet  0  . sildenafil (VIAGRA) 25 MG tablet Take 1 tablet (25 mg total) by mouth daily as needed for erectile dysfunction.  10 tablet  0  . traMADol (ULTRAM) 50 MG tablet Take 1-2 tablets (50-100 mg total) by mouth every 8 (eight) hours as needed.  60 tablet  0   No current facility-administered medications for this visit.    Exam: BP 153/85  Pulse 94  Temp(Src) 99.3 F (37.4 C) (Oral)  Wt 335 lb (151.955 kg) Gen: Well NAD HEENT: EOMI,  MMM Msk: lumbar perispinal ttp and tightness.   No results found for this or any previous visit (from the past 72  hour(s)).  A/P (as seen in Problem list)  HYPERTENSION Elevated today But likely secondary to pain  Right leg swelling Much improved.  Cont lasix prn  LOW BACK PAIN SYNDROME, SEVERE Long discussion about pain mgt and OIH. Recommend opioid rotation and will try Tramadol and reduced percocet Consider Fentanyl in the future. Pt w/ a few at home from previous Rx and to try . Also added Gabapentin 100mg  TID or 300mg  QHS May add nortiptyline in the future Previous ortho eval was unhelpful for pt Continue flexeril Pt under strict counsel not to take medications while driving.

## 2013-04-14 NOTE — Patient Instructions (Addendum)
Thank you for coming in today Please try to cut back on your pain medications as they may actually be increasing your pain Please do not use them while driving Please continue using your flexeril. Please start the gabapentin (you may take this  As 100mg  3 times a day or 300mg  at night) Come back to see me in 1 month

## 2013-04-15 NOTE — Assessment & Plan Note (Signed)
Much improved.  Cont lasix prn

## 2013-04-15 NOTE — Assessment & Plan Note (Signed)
Elevated today But likely secondary to pain

## 2013-04-15 NOTE — Assessment & Plan Note (Signed)
Long discussion about pain mgt and OIH. Recommend opioid rotation and will try Tramadol and reduced percocet Consider Fentanyl in the future. Pt w/ a few at home from previous Rx and to try . Also added Gabapentin 100mg  TID or 300mg  QHS May add nortiptyline in the future Previous ortho eval was unhelpful for pt Continue flexeril Pt under strict counsel not to take medications while driving.

## 2013-05-11 ENCOUNTER — Telehealth: Payer: Self-pay | Admitting: Family Medicine

## 2013-05-11 DIAGNOSIS — M545 Low back pain, unspecified: Secondary | ICD-10-CM

## 2013-05-11 MED ORDER — OXYCODONE-ACETAMINOPHEN 5-325 MG PO TABS: 1.0000 | ORAL_TABLET | Freq: Three times a day (TID) | ORAL | Status: DC | PRN

## 2013-05-11 MED ORDER — TRAMADOL HCL 50 MG PO TABS: 50.0000 mg | ORAL_TABLET | Freq: Three times a day (TID) | ORAL | Status: DC | PRN

## 2013-05-11 NOTE — Telephone Encounter (Signed)
Spent several minutes discussing opioid use. Pt poor historian on the timing of his last refills and pt not using gabapentin. Pt stated that it was never sent. I called [facility] who confirmed that it was sent in on 04/14/14 as indicated in our system Perc and Tramadol refilled today w/ dates indicating to be filled on 04/14/14.  Pt aware of timing of medications and need to start gabapentin. Pt not weaning off meds as he is indicated. I do believe the pt has significant back pathology. Will obtain Xray then send to ortho if needed.    Linna Darner, MD Family Medicine PGY-3 05/11/2013, 1:37 PM

## 2013-05-11 NOTE — Telephone Encounter (Signed)
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

## 2013-06-29 ENCOUNTER — Telehealth: Payer: Self-pay | Admitting: Family Medicine

## 2013-06-29 DIAGNOSIS — M545 Low back pain, unspecified: Secondary | ICD-10-CM

## 2013-06-29 MED ORDER — OXYCODONE-ACETAMINOPHEN 5-325 MG PO TABS: 1.0000 | ORAL_TABLET | Freq: Three times a day (TID) | ORAL | Status: DC | PRN

## 2013-06-29 MED ORDER — TRAMADOL HCL 50 MG PO TABS: 50.0000 mg | ORAL_TABLET | Freq: Three times a day (TID) | ORAL | Status: DC | PRN

## 2013-06-29 NOTE — Telephone Encounter (Signed)
Needs tramadol refilled too

## 2013-06-29 NOTE — Telephone Encounter (Signed)
Needs refill oxocodone.  Please advise

## 2013-06-29 NOTE — Telephone Encounter (Signed)
Spoke to pt by phone  Pt using approximately 2-3 tramadol and 1percocet per day Pt using gabapentin w/ significant improvement. Using 2-3 per day.  Using flexeril w/ benefit.    30 day refill of Tramadol 60 and perc 30  Pt to see me within 1 mo Pt to increase gaba to 100mg  TID Pt now with insurance so will consider xray and referral to ortho at next appt   Linna Darner, MD Family Medicine PGY-3 06/29/2013, 4:07 PM

## 2013-07-07 ENCOUNTER — Telehealth: Payer: Self-pay | Admitting: Family Medicine

## 2013-07-07 DIAGNOSIS — M545 Low back pain, unspecified: Secondary | ICD-10-CM

## 2013-07-07 NOTE — Telephone Encounter (Signed)
Wife called back to correct what she said earlier. They filled his oxycodone but not his tramadol. Please let them know what they have to do. jw

## 2013-07-07 NOTE — Telephone Encounter (Signed)
Pt took his RX to [facility] on [address]. They would only fill his tramandol. They would not fill his oxycodone Please advise

## 2013-07-08 NOTE — Telephone Encounter (Signed)
Spoke with pharmacy.  Pt states that he is only taking the oxycodone at bedtime and since BOTH rx say every 8 hours they only filled the oxycodone and not the tramdol.  Originally the script should have instructed to take percocet at bedtime and tramadol every 8 hours.  Will forward to MD Fleeger, Salome Spotted

## 2013-07-09 MED ORDER — OXYCODONE-ACETAMINOPHEN 5-325 MG PO TABS: 1.0000 | ORAL_TABLET | Freq: Every evening | ORAL | Status: DC | PRN

## 2013-07-09 NOTE — Telephone Encounter (Signed)
Called pharmacy to clarify. Rx for tramadol and perc both filled

## 2013-07-09 NOTE — Telephone Encounter (Signed)
Pharmacy should have filled tramadol as written. Not sure what the problem is. Please call the pharmacy to clarify

## 2013-07-12 ENCOUNTER — Ambulatory Visit: Payer: Self-pay | Admitting: Family Medicine

## 2013-08-09 ENCOUNTER — Encounter: Payer: Self-pay | Admitting: Family Medicine

## 2013-08-09 ENCOUNTER — Ambulatory Visit (INDEPENDENT_AMBULATORY_CARE_PROVIDER_SITE_OTHER): Payer: BC Managed Care – PPO | Admitting: Family Medicine

## 2013-08-09 VITALS — BP 120/82 | HR 86 | Temp 98.5°F | Wt 335.0 lb

## 2013-08-09 DIAGNOSIS — S46909A Unspecified injury of unspecified muscle, fascia and tendon at shoulder and upper arm level, unspecified arm, initial encounter: Secondary | ICD-10-CM

## 2013-08-09 DIAGNOSIS — S4990XA Unspecified injury of shoulder and upper arm, unspecified arm, initial encounter: Secondary | ICD-10-CM

## 2013-08-09 DIAGNOSIS — S4980XA Other specified injuries of shoulder and upper arm, unspecified arm, initial encounter: Secondary | ICD-10-CM

## 2013-08-09 DIAGNOSIS — M7918 Myalgia, other site: Secondary | ICD-10-CM | POA: Insufficient documentation

## 2013-08-09 DIAGNOSIS — IMO0001 Reserved for inherently not codable concepts without codable children: Secondary | ICD-10-CM

## 2013-08-09 DIAGNOSIS — I10 Essential (primary) hypertension: Secondary | ICD-10-CM

## 2013-08-09 DIAGNOSIS — E119 Type 2 diabetes mellitus without complications: Secondary | ICD-10-CM

## 2013-08-09 LAB — POCT GLYCOSYLATED HEMOGLOBIN (HGB A1C): HEMOGLOBIN A1C: 8.2

## 2013-08-09 MED ORDER — GABAPENTIN 100 MG PO CAPS: 100.0000 mg | ORAL_CAPSULE | Freq: Three times a day (TID) | ORAL | Status: DC

## 2013-08-09 MED ORDER — CYCLOBENZAPRINE HCL 10 MG PO TABS: 10.0000 mg | ORAL_TABLET | Freq: Three times a day (TID) | ORAL | Status: DC | PRN

## 2013-08-09 MED ORDER — LISINOPRIL 20 MG PO TABS: 20.0000 mg | ORAL_TABLET | Freq: Every day | ORAL | Status: DC

## 2013-08-09 MED ORDER — FUROSEMIDE 40 MG PO TABS: 40.0000 mg | ORAL_TABLET | ORAL | Status: DC

## 2013-08-09 MED ORDER — METFORMIN HCL 1000 MG PO TABS: 1000.0000 mg | ORAL_TABLET | Freq: Two times a day (BID) | ORAL | Status: DC

## 2013-08-09 MED ORDER — HYDROCODONE-ACETAMINOPHEN 10-325 MG PO TABS: 1.0000 | ORAL_TABLET | Freq: Every evening | ORAL | Status: DC | PRN

## 2013-08-09 MED ORDER — TRAMADOL HCL 50 MG PO TABS: 50.0000 mg | ORAL_TABLET | Freq: Three times a day (TID) | ORAL | Status: DC | PRN

## 2013-08-09 MED ORDER — GLIPIZIDE 10 MG PO TABS: 10.0000 mg | ORAL_TABLET | Freq: Two times a day (BID) | ORAL | Status: DC

## 2013-08-09 NOTE — Assessment & Plan Note (Signed)
At goal for DM pt.  No change

## 2013-08-09 NOTE — Progress Notes (Signed)
Ray Hall is a 53 y.o. male who presents to [facility] today for msk pain   MSK: shoulder and lower back pain. NOw has [employer] adn will call ortho [facility] - Dr. Theda Sers who performed previous L knee replacement). Currently taking flexeril BID -TID, Tramadol 100mg  BID, and percocet 2 pills QHS, and 2 aleve BID. Some benefit w/ this regimen but still in considerable pain. Took friends hydrocodone w/ considerable benefit all through the night. Pain radiates from back down legs from tim to time R>L. Neck pain present for several years but worse since injuring R shoulder  R shoulder: pain since falling and catching self on chain w/ outstretched hand. Catches and pops and difficult to move behind head and back.   DM: metformin and glipizide BID.    The following portions of the patient's history were reviewed and updated as appropriate: allergies, current medications, past medical history, family and social history, and problem list.  Patient is a Art therapist  Past Medical History  Diagnosis Date  . Sickle cell trait   . Normal nuclear stress test 07/2010    low prob of ischemia, EF 42%  . Echocardiogram abnormal     moderate LVH, mild LV hypokenesis, EF 42%  . History of Doppler ultrasound 07/2010    negative for DVT  . Abnormal CT of the abdomen     mild L hydronephrosis and hydroureter  . Status post radiofrequency ablation for arrhythmia 10/16/10    [facility] Howell Rucks MD)  . Diabetes mellitus   . Hypertension     ROS as above otherwise neg.    Medications reviewed. Current Outpatient Prescriptions  Medication Sig Dispense Refill  . Aspirin-Salicylamide-Caffeine (BC HEADACHE POWDER PO) Take 1 packet by mouth every 8 (eight) hours as needed (for pain).      . cyclobenzaprine (FLEXERIL) 10 MG tablet Take 1 tablet (10 mg total) by mouth 3 (three) times daily as needed. For muscle spasms.  90 tablet  6  . diltiazem (CARDIZEM) 60 MG tablet Take 1 tablet (60 mg total) by mouth 2 (two) times  daily.  60 tablet  3  . furosemide (LASIX) 40 MG tablet Take 1 tablet (40 mg total) by mouth 3 (three) times a week.  30 tablet  3  . gabapentin (NEURONTIN) 100 MG capsule Take 1 capsule (100 mg total) by mouth 3 (three) times daily.  90 capsule  3  . glipiZIDE (GLUCOTROL) 10 MG tablet Take 1 tablet (10 mg total) by mouth 2 (two) times daily before a meal.  60 tablet  3  . lisinopril (PRINIVIL,ZESTRIL) 20 MG tablet Take 1 tablet (20 mg total) by mouth daily.  90 tablet  3  . metFORMIN (GLUCOPHAGE) 1000 MG tablet Take 1 tablet (1,000 mg total) by mouth 2 (two) times daily with a meal.  180 tablet  3  . Multiple Vitamin (MULTIVITAMIN WITH MINERALS) TABS Take 1 tablet by mouth daily.      . naproxen sodium (ANAPROX) 220 MG tablet Take 440 mg by mouth 2 (two) times daily.      Marland Kitchen HYDROcodone-acetaminophen (NORCO) 10-325 MG per tablet Take 1-2 tablets by mouth at bedtime as needed.  60 tablet  0  . traMADol (ULTRAM) 50 MG tablet Take 1-2 tablets (50-100 mg total) by mouth every 8 (eight) hours as needed.  60 tablet  3   No current facility-administered medications for this visit.    Exam:  BP 120/82  Pulse 86  Temp(Src) 98.5 F (36.9 C) (  Oral)  Wt 335 lb (151.955 kg) Gen: Well NAD HEENT: EOMI,  MMM MSK: R shoulder W/ limited ROM primarily w/ abduction, and hand behind the back. (see previous exams for more detailed exam)  No results found for this or any previous visit (from the past 72 hour(s)).  A/P (as seen in Problem list)  DIABETES MELLITUS, TYPE II A1c continues to climb.  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  Discussed other oral options such as Januvia Pt will try to cut back on sweets adn soda (as he confessed that he has had a lot of this recently)   HYPERTENSION At goal for DM pt.  No change  Shoulder injury Given new information today on traumatic injury and exam today - concerned  for labral tear.  Needs ortho eval and pt to call Dr. Theda Sers at [facility] pain Pt w/ various msk pain complaints. Chronic issue for pt Pt has h/o falls leading to back pain and shoulder injury. H/o L knee replacement w/ resolution of pain. Pt tried on multiple pain regimens Will DC percocet today and change to vicodin Continue tramadol and diclovenac or aleve.  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  Will obtain c-spine and L spine films along w/ R shoulder film.  Pt to set up f/u w/ ortho for possible surgical interventions.

## 2013-08-09 NOTE — Assessment & Plan Note (Signed)
Given new information today on traumatic injury and exam today - concerned for labral tear.  Needs ortho eval and pt to call Dr. Theda Sers at [facility]

## 2013-08-09 NOTE — Assessment & Plan Note (Signed)
Pt w/ various msk pain complaints. Chronic issue for pt Pt has h/o falls leading to back pain and shoulder injury. H/o L knee replacement w/ resolution of pain. Pt tried on multiple pain regimens Will DC percocet today and change to vicodin Continue tramadol and diclovenac or aleve.  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  Will obtain c-spine and L spine films along w/ R shoulder film.  Pt to set up f/u w/ ortho for possible surgical interventions.

## 2013-08-09 NOTE — Assessment & Plan Note (Signed)
A1c continues to climb.  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  Discussed other oral options such as Januvia Pt will try to cut back on sweets adn soda (as he confessed that he has had a lot of this recently)

## 2013-08-09 NOTE — Patient Instructions (Signed)
You are doing well overall but need to get your diabetes under better control Try to cut back on the sweets and lose a little weight.  Please call Ray Hall for an appointment with Dr. Theda Sers Please go to [facility] for xrays.  Pelase come back to see me in 6 weeks

## 2013-08-18 ENCOUNTER — Telehealth: Payer: Self-pay | Admitting: Family Medicine

## 2013-08-18 NOTE — Telephone Encounter (Signed)
Spoke with wife and she is aware of appt and will pass this information on to the patient. Laymon Stockert,CMA

## 2013-08-18 NOTE — Telephone Encounter (Signed)
LVM to pt. Please inform pt   Appt 05/04@3 :[facility] [place], [place] [address]    [place]

## 2013-08-26 ENCOUNTER — Emergency Department (HOSPITAL_COMMUNITY): Payer: BC Managed Care – PPO

## 2013-08-26 ENCOUNTER — Encounter (HOSPITAL_COMMUNITY): Payer: Self-pay | Admitting: Emergency Medicine

## 2013-08-26 ENCOUNTER — Inpatient Hospital Stay (HOSPITAL_COMMUNITY)
Admission: EM | Admit: 2013-08-26 | Discharge: 2013-08-30 | DRG: 871 | Disposition: A | Discharge status: Home Health | Payer: BC Managed Care – PPO | Attending: Family Medicine | Admitting: Family Medicine

## 2013-08-26 DIAGNOSIS — M545 Low back pain, unspecified: Secondary | ICD-10-CM | POA: Diagnosis present

## 2013-08-26 DIAGNOSIS — Z8249 Family history of ischemic heart disease and other diseases of the circulatory system: Secondary | ICD-10-CM

## 2013-08-26 DIAGNOSIS — M79604 Pain in right leg: Secondary | ICD-10-CM

## 2013-08-26 DIAGNOSIS — Z79899 Other long term (current) drug therapy: Secondary | ICD-10-CM

## 2013-08-26 DIAGNOSIS — Z833 Family history of diabetes mellitus: Secondary | ICD-10-CM

## 2013-08-26 DIAGNOSIS — E871 Hypo-osmolality and hyponatremia: Secondary | ICD-10-CM | POA: Diagnosis present

## 2013-08-26 DIAGNOSIS — I4891 Unspecified atrial fibrillation: Secondary | ICD-10-CM | POA: Diagnosis present

## 2013-08-26 DIAGNOSIS — M7989 Other specified soft tissue disorders: Secondary | ICD-10-CM

## 2013-08-26 DIAGNOSIS — I480 Paroxysmal atrial fibrillation: Secondary | ICD-10-CM

## 2013-08-26 DIAGNOSIS — G929 Unspecified toxic encephalopathy: Secondary | ICD-10-CM | POA: Diagnosis present

## 2013-08-26 DIAGNOSIS — R4182 Altered mental status, unspecified: Secondary | ICD-10-CM

## 2013-08-26 DIAGNOSIS — I10 Essential (primary) hypertension: Secondary | ICD-10-CM

## 2013-08-26 DIAGNOSIS — G9340 Encephalopathy, unspecified: Secondary | ICD-10-CM | POA: Diagnosis present

## 2013-08-26 DIAGNOSIS — G8929 Other chronic pain: Secondary | ICD-10-CM | POA: Diagnosis present

## 2013-08-26 DIAGNOSIS — G92 Toxic encephalopathy: Secondary | ICD-10-CM | POA: Diagnosis present

## 2013-08-26 DIAGNOSIS — F17200 Nicotine dependence, unspecified, uncomplicated: Secondary | ICD-10-CM | POA: Diagnosis present

## 2013-08-26 DIAGNOSIS — F1110 Opioid abuse, uncomplicated: Secondary | ICD-10-CM | POA: Diagnosis present

## 2013-08-26 DIAGNOSIS — E119 Type 2 diabetes mellitus without complications: Secondary | ICD-10-CM

## 2013-08-26 DIAGNOSIS — M7918 Myalgia, other site: Secondary | ICD-10-CM

## 2013-08-26 DIAGNOSIS — A419 Sepsis, unspecified organism: Secondary | ICD-10-CM

## 2013-08-26 DIAGNOSIS — B003 Herpesviral meningitis: Secondary | ICD-10-CM | POA: Diagnosis present

## 2013-08-26 DIAGNOSIS — D573 Sickle-cell trait: Secondary | ICD-10-CM | POA: Diagnosis present

## 2013-08-26 LAB — BLOOD GAS, VENOUS
Acid-base deficit: 1.2 mmol/L (ref 0.0–2.0)
Bicarbonate: 22.5 mEq/L (ref 20.0–24.0)
DRAWN BY: 235321
FIO2: 0.21 %
O2 Saturation: 55.9 %
Patient temperature: 103.1
TCO2: 20.3 mmol/L (ref 0–100)
pCO2, Ven: 40.5 mmHg — ABNORMAL LOW (ref 45.0–50.0)
pH, Ven: 7.376 — ABNORMAL HIGH (ref 7.250–7.300)

## 2013-08-26 LAB — CBC WITH DIFFERENTIAL/PLATELET
BASOS PCT: 0 % (ref 0–1)
Basophils Absolute: 0 10*3/uL (ref 0.0–0.1)
EOS PCT: 0 % (ref 0–5)
Eosinophils Absolute: 0.1 10*3/uL (ref 0.0–0.7)
HCT: 35.2 % — ABNORMAL LOW (ref 39.0–52.0)
Hemoglobin: 12.6 g/dL — ABNORMAL LOW (ref 13.0–17.0)
Lymphocytes Relative: 4 % — ABNORMAL LOW (ref 12–46)
Lymphs Abs: 0.9 10*3/uL (ref 0.7–4.0)
MCH: 28.7 pg (ref 26.0–34.0)
MCHC: 35.8 g/dL (ref 30.0–36.0)
MCV: 80.2 fL (ref 78.0–100.0)
Monocytes Absolute: 1 10*3/uL (ref 0.1–1.0)
Monocytes Relative: 5 % (ref 3–12)
NEUTROS PCT: 90 % — AB (ref 43–77)
Neutro Abs: 17.8 10*3/uL — ABNORMAL HIGH (ref 1.7–7.7)
Platelets: 247 10*3/uL (ref 150–400)
RBC: 4.39 MIL/uL (ref 4.22–5.81)
RDW: 12.7 % (ref 11.5–15.5)
WBC: 19.7 10*3/uL — ABNORMAL HIGH (ref 4.0–10.5)

## 2013-08-26 LAB — URINE MICROSCOPIC-ADD ON

## 2013-08-26 LAB — RAPID URINE DRUG SCREEN, HOSP PERFORMED
Amphetamines: NOT DETECTED
Barbiturates: NOT DETECTED
Benzodiazepines: NOT DETECTED
COCAINE: NOT DETECTED
OPIATES: POSITIVE — AB
Tetrahydrocannabinol: NOT DETECTED

## 2013-08-26 LAB — I-STAT CG4 LACTIC ACID, ED: LACTIC ACID, VENOUS: 4.04 mmol/L — AB (ref 0.5–2.2)

## 2013-08-26 LAB — COMPREHENSIVE METABOLIC PANEL
ALT: 19 U/L (ref 0–53)
AST: 18 U/L (ref 0–37)
Albumin: 3.7 g/dL (ref 3.5–5.2)
Alkaline Phosphatase: 72 U/L (ref 39–117)
BUN: 16 mg/dL (ref 6–23)
CALCIUM: 9.3 mg/dL (ref 8.4–10.5)
CO2: 21 mEq/L (ref 19–32)
Chloride: 94 mEq/L — ABNORMAL LOW (ref 96–112)
Creatinine, Ser: 0.97 mg/dL (ref 0.50–1.35)
GLUCOSE: 432 mg/dL — AB (ref 70–99)
Potassium: 4.4 mEq/L (ref 3.7–5.3)
SODIUM: 133 meq/L — AB (ref 137–147)
TOTAL PROTEIN: 7.5 g/dL (ref 6.0–8.3)
Total Bilirubin: 0.2 mg/dL — ABNORMAL LOW (ref 0.3–1.2)

## 2013-08-26 LAB — CBG MONITORING, ED: GLUCOSE-CAPILLARY: 430 mg/dL — AB (ref 70–99)

## 2013-08-26 LAB — URINALYSIS, ROUTINE W REFLEX MICROSCOPIC
Bilirubin Urine: NEGATIVE
Ketones, ur: NEGATIVE mg/dL
LEUKOCYTES UA: NEGATIVE
NITRITE: NEGATIVE
PH: 5.5 (ref 5.0–8.0)
Protein, ur: NEGATIVE mg/dL
SPECIFIC GRAVITY, URINE: 1.026 (ref 1.005–1.030)
Urobilinogen, UA: 1 mg/dL (ref 0.0–1.0)

## 2013-08-26 LAB — ETHANOL: Alcohol, Ethyl (B): <11 mg/dL (ref 0–11)

## 2013-08-26 LAB — PRO B NATRIURETIC PEPTIDE: PRO B NATRI PEPTIDE: 26.7 pg/mL (ref 0–125)

## 2013-08-26 LAB — TROPONIN I

## 2013-08-26 MED ORDER — DEXTROSE 5 % IV SOLN
10.0000 mg/kg | Freq: Three times a day (TID) | INTRAVENOUS | Status: DC
  Administered 2013-08-26 – 2013-08-30 (×12): 960 mg via INTRAVENOUS
  Filled 2013-08-26 (×16): qty 19.2

## 2013-08-26 MED ORDER — NALOXONE HCL 0.4 MG/ML IJ SOLN
0.4000 mg | Freq: Once | INTRAMUSCULAR | Status: AC
  Administered 2013-08-26: 0.4 mg via INTRAVENOUS
  Filled 2013-08-26: qty 1

## 2013-08-26 MED ORDER — CEFTRIAXONE SODIUM 2 G IJ SOLR
2.0000 g | INTRAMUSCULAR | Status: DC
  Administered 2013-08-26: 2 g via INTRAVENOUS
  Filled 2013-08-26: qty 2

## 2013-08-26 MED ORDER — SODIUM CHLORIDE 0.9 % IV SOLN
INTRAVENOUS | Status: DC
  Administered 2013-08-26: 21:00:00 via INTRAVENOUS

## 2013-08-26 MED ORDER — NALOXONE HCL 0.4 MG/ML IJ SOLN: 0.4000 mg | Freq: Once | INTRAMUSCULAR | Status: DC

## 2013-08-26 MED ORDER — ACETAMINOPHEN 325 MG PO TABS
650.0000 mg | ORAL_TABLET | Freq: Once | ORAL | Status: AC
  Administered 2013-08-26: 650 mg via ORAL
  Filled 2013-08-26: qty 2

## 2013-08-26 NOTE — ED Notes (Signed)
Bed: WA07 Expected date:  Expected time:  Means of arrival:  Comments: EMS/52 yo male hyperglycemia/bilateral pedal edema/malaise

## 2013-08-26 NOTE — ED Notes (Signed)
Pt still at radiology

## 2013-08-26 NOTE — ED Notes (Signed)
Patient transported to CT 

## 2013-08-26 NOTE — ED Notes (Signed)
[facility] EDP made aware of patient CG4 Lactic results. 

## 2013-08-26 NOTE — ED Provider Notes (Signed)
CSN: [id number]     Arrival date & time 08/26/13  1949 History   First MD Initiated Contact with Patient 08/26/13 1958     Chief Complaint  Patient presents with  . Leg Swelling     (Consider location/radiation/quality/duration/timing/severity/associated sxs/prior Treatment) The history is provided by the patient.   Patient here complaining of muscle spasms that started in his neck and upper back and radiate down to his leg. He was standing up when this occurred. Patient did use some medications prior to arrival consisted of Vicodin, tramadol, hydrocortisone. Was noted to be afebrile here. He has had some rigors and increased cough and congestion. Denies any dysuria hematuria. No rashes noted. No neck pain. Does have mild headache. No photophobia. Symptoms persisted and no treatment used prior to arrival. Past Medical History  Diagnosis Date  . Sickle cell trait   . Normal nuclear stress test 07/2010    low prob of ischemia, EF 42%  . Echocardiogram abnormal     moderate LVH, mild LV hypokenesis, EF 42%  . History of Doppler ultrasound 07/2010    negative for DVT  . Abnormal CT of the abdomen     mild L hydronephrosis and hydroureter  . Status post radiofrequency ablation for arrhythmia 10/16/10    [facility] Howell Rucks MD)  . Diabetes mellitus   . Hypertension    Past Surgical History  Procedure Laterality Date  . Radiofrequency ablation  10/16/10    Cardiac for atrial arrythmia, [facility]  . Dental extractions  10/15/10    prior to ablation   Family History  Problem Relation Age of Onset  . Sickle cell trait    . Heart failure Father   . Diabetes Father   . Diabetes Mother   . Hypertension Mother    History  Substance Use Topics  . Smoking status: Current Every Day Smoker    Types: Cigars  . Smokeless tobacco: Never Used     Comment: 2 cigars  . Alcohol Use: No    Review of Systems  All other systems reviewed and are negative.     Allergies  Review of patient's  allergies indicates no known allergies.  Home Medications   Prior to Admission medications   Medication Sig Start Date End Date Taking? Authorizing Provider  Aspirin-Salicylamide-Caffeine (BC HEADACHE POWDER PO) Take 1 packet by mouth every 8 (eight) hours as needed (for pain).    Historical Provider, MD  cyclobenzaprine (FLEXERIL) 10 MG tablet Take 1 tablet (10 mg total) by mouth 3 (three) times daily as needed. For muscle spasms. 08/09/13   Waldemar Dickens, MD  diltiazem (CARDIZEM) 60 MG tablet Take 1 tablet (60 mg total) by mouth 2 (two) times daily. 02/20/13   Waldemar Dickens, MD  furosemide (LASIX) 40 MG tablet Take 1 tablet (40 mg total) by mouth 3 (three) times a week. 08/09/13 08/09/14  Waldemar Dickens, MD  gabapentin (NEURONTIN) 100 MG capsule Take 1 capsule (100 mg total) by mouth 3 (three) times daily. 08/09/13   Waldemar Dickens, MD  glipiZIDE (GLUCOTROL) 10 MG tablet Take 1 tablet (10 mg total) by mouth 2 (two) times daily before a meal. 08/09/13   Waldemar Dickens, MD  HYDROcodone-acetaminophen [facility]) 10-325 MG per tablet Take 1-2 tablets by mouth at bedtime as needed. 08/09/13   Waldemar Dickens, MD  lisinopril (PRINIVIL,ZESTRIL) 20 MG tablet Take 1 tablet (20 mg total) by mouth daily. 08/09/13   Waldemar Dickens, MD  metFORMIN (GLUCOPHAGE)  1000 MG tablet Take 1 tablet (1,000 mg total) by mouth 2 (two) times daily with a meal. 08/09/13   Waldemar Dickens, MD  Multiple Vitamin (MULTIVITAMIN WITH MINERALS) TABS Take 1 tablet by mouth daily.    Historical Provider, MD  naproxen sodium (ANAPROX) 220 MG tablet Take 440 mg by mouth 2 (two) times daily.    Historical Provider, MD  traMADol (ULTRAM) 50 MG tablet Take 1-2 tablets (50-100 mg total) by mouth every 8 (eight) hours as needed. 08/09/13   Waldemar Dickens, MD   BP 149/76  Pulse 118  Temp(Src) 103.1 F (39.5 C) (Oral)  Resp 16  SpO2 92% Physical Exam  Nursing note and vitals reviewed. Constitutional: He is oriented to person, place, and  time. He appears well-developed and well-nourished. He appears lethargic.  Non-toxic appearance. No distress.  HENT:  Head: Normocephalic and atraumatic.  Eyes: Conjunctivae, EOM and lids are normal. Pupils are equal, round, and reactive to light.  Neck: Normal range of motion. Neck supple. No rigidity. No tracheal deviation and normal range of motion present. No mass present.  Cardiovascular: Normal rate, regular rhythm and normal heart sounds.  Exam reveals no gallop.   No murmur heard. Pulmonary/Chest: Effort normal and breath sounds normal. No stridor. No respiratory distress. He has no decreased breath sounds. He has no wheezes. He has no rhonchi. He has no rales.  Abdominal: Soft. Normal appearance and bowel sounds are normal. He exhibits no distension. There is no tenderness. There is no rebound and no CVA tenderness.  Musculoskeletal: Normal range of motion. He exhibits no edema and no tenderness.  2+ pitting edema bilateral lower extremities  Neurological: He is oriented to person, place, and time. He appears lethargic. No cranial nerve deficit or sensory deficit. GCS eye subscore is 4. GCS verbal subscore is 5. GCS motor subscore is 6.  Patient's neurological exam is limited by effort. He does withdraw to pain in all 4 extremities.  Skin: Skin is warm and dry. No abrasion and no rash noted.  Psychiatric: His affect is blunt. His speech is delayed. He is slowed.    ED Course  Procedures (including critical care time) Labs Review Labs Reviewed  CBG MONITORING, ED - Abnormal; Notable for the following:    Glucose-Capillary 430 (*)    All other components within normal limits  CULTURE, BLOOD (ROUTINE X 2)  CULTURE, BLOOD (ROUTINE X 2)  URINE CULTURE  CBC WITH DIFFERENTIAL  COMPREHENSIVE METABOLIC PANEL  URINALYSIS, ROUTINE W REFLEX MICROSCOPIC  I-STAT CG4 LACTIC ACID, ED    Imaging Review No results found.   EKG Interpretation   Date/Time:  Thursday August 26 2013 20:20:41  EDT Ventricular Rate:  118 PR Interval:  95 QRS Duration: 83 QT Interval:  298 QTC Calculation: 417 R Axis:   2 Text Interpretation:  Sinus tachycardia Abnormal R-wave progression, early  transition Abnormal T, consider ischemia, lateral leads Confirmed by Harlie Ragle   MD, Kamaile Zachow ([id number]) on 08/26/2013 9:01:10 PM      MDM   Final diagnoses:  None    Patient given Tylenol here for his temperature of 103.2. He was ultimately given Narcan 0.4 mg because of a suspected history of opiate use. He responded significantly to the Narcan. It is more alert this time. He does complain of neck pain at this time. Concern for meningitis and patient empirically started with Rocephin 2 g and acyclovir 10 mg per kilogram. Patient to have lumbar puncture per IR. Patient's elevated lactate  was noted. Is given IV fluids. Patient also with hyperglycemia. Given insulin for this. We'll consult medicine for admission   CRITICAL CARE Performed by: Leota Jacobsen Total critical care time: 26 Critical care time was exclusive of separately billable procedures and treating other patients. Critical care was necessary to treat or prevent imminent or life-threatening deterioration. Critical care was time spent personally by me on the following activities: development of treatment plan with patient and/or surrogate as well as nursing, discussions with consultants, evaluation of patient's response to treatment, examination of patient, obtaining history from patient or surrogate, ordering and performing treatments and interventions, ordering and review of laboratory studies, ordering and review of radiographic studies, pulse oximetry and re-evaluation of patient's condition.     Leota Jacobsen, MD 08/26/13 2200

## 2013-08-26 NOTE — ED Notes (Signed)
Per EMS, Pt c/o bilateral leg swelling. Family was concerned because pt. Unable to walk. Pt confessed to taking "a couple of vicodin, tramadol, and hydrocortisone." Pt has pinpoint pupils. While transferring pt. To stretcher, pt c/o back spasms. Pt A&Ox4, but has some trouble following commands and must be asked repeatedly.

## 2013-08-27 ENCOUNTER — Other Ambulatory Visit: Payer: Self-pay | Admitting: Radiology

## 2013-08-27 ENCOUNTER — Encounter (HOSPITAL_COMMUNITY): Payer: Self-pay | Admitting: *Deleted

## 2013-08-27 DIAGNOSIS — A419 Sepsis, unspecified organism: Principal | ICD-10-CM

## 2013-08-27 DIAGNOSIS — IMO0001 Reserved for inherently not codable concepts without codable children: Secondary | ICD-10-CM

## 2013-08-27 DIAGNOSIS — I10 Essential (primary) hypertension: Secondary | ICD-10-CM

## 2013-08-27 DIAGNOSIS — I4891 Unspecified atrial fibrillation: Secondary | ICD-10-CM

## 2013-08-27 DIAGNOSIS — E119 Type 2 diabetes mellitus without complications: Secondary | ICD-10-CM

## 2013-08-27 DIAGNOSIS — M7989 Other specified soft tissue disorders: Secondary | ICD-10-CM

## 2013-08-27 DIAGNOSIS — G9340 Encephalopathy, unspecified: Secondary | ICD-10-CM | POA: Diagnosis present

## 2013-08-27 LAB — CBC
HEMATOCRIT: 32.6 % — AB (ref 39.0–52.0)
HEMOGLOBIN: 11.5 g/dL — AB (ref 13.0–17.0)
MCH: 28.3 pg (ref 26.0–34.0)
MCHC: 35.3 g/dL (ref 30.0–36.0)
MCV: 80.3 fL (ref 78.0–100.0)
Platelets: 218 10*3/uL (ref 150–400)
RBC: 4.06 MIL/uL — AB (ref 4.22–5.81)
RDW: 12.8 % (ref 11.5–15.5)
WBC: 17.6 10*3/uL — ABNORMAL HIGH (ref 4.0–10.5)

## 2013-08-27 LAB — GLUCOSE, CAPILLARY
Glucose-Capillary: 320 mg/dL — ABNORMAL HIGH (ref 70–99)
Glucose-Capillary: 231 mg/dL — ABNORMAL HIGH (ref 70–99)
Glucose-Capillary: 148 mg/dL — ABNORMAL HIGH (ref 70–99)
Glucose-Capillary: 167 mg/dL — ABNORMAL HIGH (ref 70–99)
Glucose-Capillary: 259 mg/dL — ABNORMAL HIGH (ref 70–99)
Glucose-Capillary: 266 mg/dL — ABNORMAL HIGH (ref 70–99)

## 2013-08-27 LAB — BASIC METABOLIC PANEL
BUN: 19 mg/dL (ref 6–23)
CHLORIDE: 103 meq/L (ref 96–112)
CO2: 22 meq/L (ref 19–32)
Calcium: 8.5 mg/dL (ref 8.4–10.5)
Creatinine, Ser: 1.13 mg/dL (ref 0.50–1.35)
GFR calc Af Amer: 86 mL/min — ABNORMAL LOW (ref >90)
GFR calc non Af Amer: 74 mL/min — ABNORMAL LOW (ref >90)
GLUCOSE: 195 mg/dL — AB (ref 70–99)
POTASSIUM: 3.8 meq/L (ref 3.7–5.3)
SODIUM: 140 meq/L (ref 137–147)

## 2013-08-27 LAB — CBG MONITORING, ED: Glucose-Capillary: 307 mg/dL — ABNORMAL HIGH (ref 70–99)

## 2013-08-27 LAB — TROPONIN I
Troponin I: <0.3 ng/mL (ref <0.30)
Troponin I: <0.3 ng/mL (ref <0.30)

## 2013-08-27 LAB — CSF CELL COUNT WITH DIFFERENTIAL
Other Cells, CSF: UNDETERMINED
RBC COUNT CSF: 10127 /mm3 — AB
SUPERNATANT: UNDETERMINED
TUBE #: 4
WBC CSF: 101 /mm3 — AB (ref 0–5)

## 2013-08-27 LAB — LACTIC ACID, PLASMA: Lactic Acid, Venous: 2.2 mmol/L (ref 0.5–2.2)

## 2013-08-27 LAB — RPR

## 2013-08-27 LAB — MRSA PCR SCREENING: MRSA by PCR: POSITIVE — AB

## 2013-08-27 MED ORDER — SODIUM CHLORIDE 0.9 % IJ SOLN
3.0000 mL | Freq: Two times a day (BID) | INTRAMUSCULAR | Status: DC
  Administered 2013-08-27 – 2013-08-29 (×6): 3 mL via INTRAVENOUS

## 2013-08-27 MED ORDER — LISINOPRIL 20 MG PO TABS
20.0000 mg | ORAL_TABLET | Freq: Every day | ORAL | Status: DC
  Filled 2013-08-27: qty 1

## 2013-08-27 MED ORDER — MUPIROCIN 2 % EX OINT
1.0000 "application " | TOPICAL_OINTMENT | Freq: Two times a day (BID) | CUTANEOUS | Status: DC
  Administered 2013-08-27 – 2013-08-30 (×7): 1 via NASAL
  Filled 2013-08-27 (×2): qty 22

## 2013-08-27 MED ORDER — METRONIDAZOLE IN NACL 5-0.79 MG/ML-% IV SOLN
500.0000 mg | Freq: Three times a day (TID) | INTRAVENOUS | Status: DC
  Administered 2013-08-27 – 2013-08-29 (×6): 500 mg via INTRAVENOUS
  Filled 2013-08-27 (×8): qty 100

## 2013-08-27 MED ORDER — ACETAMINOPHEN 325 MG PO TABS
650.0000 mg | ORAL_TABLET | Freq: Four times a day (QID) | ORAL | Status: DC | PRN
  Administered 2013-08-28 (×2): 650 mg via ORAL
  Filled 2013-08-27 (×2): qty 2

## 2013-08-27 MED ORDER — INSULIN ASPART 100 UNIT/ML ~~LOC~~ SOLN
0.0000 [IU] | Freq: Three times a day (TID) | SUBCUTANEOUS | Status: DC
  Administered 2013-08-27: 8 [IU] via SUBCUTANEOUS
  Administered 2013-08-27: 2 [IU] via SUBCUTANEOUS
  Administered 2013-08-27: 3 [IU] via SUBCUTANEOUS
  Administered 2013-08-28: 5 [IU] via SUBCUTANEOUS
  Administered 2013-08-28 (×2): 8 [IU] via SUBCUTANEOUS
  Administered 2013-08-29: 15 [IU] via SUBCUTANEOUS
  Administered 2013-08-29 – 2013-08-30 (×2): 5 [IU] via SUBCUTANEOUS
  Administered 2013-08-30: 2 [IU] via SUBCUTANEOUS

## 2013-08-27 MED ORDER — ENOXAPARIN SODIUM 80 MG/0.8ML ~~LOC~~ SOLN
75.0000 mg | Freq: Every day | SUBCUTANEOUS | Status: DC
  Administered 2013-08-27 – 2013-08-30 (×4): 75 mg via SUBCUTANEOUS
  Filled 2013-08-27 (×4): qty 0.8

## 2013-08-27 MED ORDER — CEFTRIAXONE SODIUM 2 G IJ SOLR
2.0000 g | Freq: Two times a day (BID) | INTRAMUSCULAR | Status: DC
  Administered 2013-08-27 – 2013-08-30 (×7): 2 g via INTRAVENOUS
  Filled 2013-08-27 (×11): qty 2

## 2013-08-27 MED ORDER — SODIUM CHLORIDE 0.9 % IV BOLUS (SEPSIS)
1000.0000 mL | Freq: Once | INTRAVENOUS | Status: AC
  Administered 2013-08-27: 1000 mL via INTRAVENOUS

## 2013-08-27 MED ORDER — KETOROLAC TROMETHAMINE 30 MG/ML IJ SOLN
30.0000 mg | Freq: Four times a day (QID) | INTRAMUSCULAR | Status: DC | PRN
  Administered 2013-08-27 – 2013-08-28 (×3): 30 mg via INTRAVENOUS
  Filled 2013-08-27 (×3): qty 1

## 2013-08-27 MED ORDER — SODIUM CHLORIDE 0.9 % IV SOLN: INTRAVENOUS | Status: AC

## 2013-08-27 MED ORDER — DILTIAZEM HCL 60 MG PO TABS
60.0000 mg | ORAL_TABLET | Freq: Two times a day (BID) | ORAL | Status: DC
  Administered 2013-08-27 – 2013-08-30 (×8): 60 mg via ORAL
  Filled 2013-08-27 (×11): qty 1

## 2013-08-27 MED ORDER — CHLORHEXIDINE GLUCONATE CLOTH 2 % EX PADS
6.0000 | MEDICATED_PAD | Freq: Every day | CUTANEOUS | Status: DC
  Administered 2013-08-27 – 2013-08-30 (×4): 6 via TOPICAL

## 2013-08-27 MED ORDER — INSULIN ASPART 100 UNIT/ML ~~LOC~~ SOLN
10.0000 [IU] | Freq: Once | SUBCUTANEOUS | Status: AC
  Administered 2013-08-27: 10 [IU] via SUBCUTANEOUS

## 2013-08-27 MED ORDER — VANCOMYCIN HCL 10 G IV SOLR
1500.0000 mg | Freq: Two times a day (BID) | INTRAVENOUS | Status: DC
  Administered 2013-08-27 – 2013-08-29 (×4): 1500 mg via INTRAVENOUS
  Filled 2013-08-27 (×5): qty 1500

## 2013-08-27 MED ORDER — VANCOMYCIN HCL 10 G IV SOLR
2000.0000 mg | Freq: Once | INTRAVENOUS | Status: AC
  Administered 2013-08-27: 2000 mg via INTRAVENOUS
  Filled 2013-08-27: qty 2000

## 2013-08-27 MED ORDER — ONDANSETRON HCL 4 MG/2ML IJ SOLN
4.0000 mg | Freq: Four times a day (QID) | INTRAMUSCULAR | Status: DC | PRN
Start: 2013-08-27 — End: 2013-08-29

## 2013-08-27 MED ORDER — ONDANSETRON HCL 4 MG PO TABS: 4.0000 mg | ORAL_TABLET | Freq: Four times a day (QID) | ORAL | Status: DC | PRN

## 2013-08-27 NOTE — Progress Notes (Signed)
FMTS ATTENDING  NOTE Ray Hall  I have discussed this patient with the resident. I agree with the resident's findings, assessment and care plan. In addition,he does have hyponatremia, which is gradually improving on normal saline, plan to recheck BMet.

## 2013-08-27 NOTE — Progress Notes (Signed)
Inpatient Diabetes Program Recommendations  AACE/ADA: New Consensus Statement on Inpatient Glycemic Control (2013)  Target Ranges:  Prepandial:   less than 140 mg/dL      Peak postprandial:   less than 180 mg/dL (1-2 hours)      Critically ill patients:  140 - 180 mg/dL   Reason for Assessment:  Results for KALIM, KISSEL (MRN [id number]) as of 08/27/2013 14:41  Ref. Range 08/26/2013 20:00 08/27/2013 01:06 08/27/2013 01:45 08/27/2013 04:13 08/27/2013 07:52 08/27/2013 11:40  Glucose-Capillary Latest Range: 70-99 mg/dL 430 (H) 307 (H) 320 (H) 231 (H) 148 (H) 167 (H)  Results for LY, BACCHI (MRN [id number]) as of 08/27/2013 14:41  Ref. Range 08/09/2013 12:36  Hemoglobin A1C Latest Range: <5.7 % 8.2   Diabetes history: Type 2 diabetes Outpatient Diabetes medications: Glucotrol 10 mg bid, Metformin 1000 mg bid Current orders for Inpatient glycemic control: Novolog moderate tid with meals  Consider adding Lantus 25 units daily while patient is in the hospital.   Thanks, Adah Perl, RN, BC-ADM Inpatient Diabetes Coordinator Pager [phone]

## 2013-08-27 NOTE — H&P (Signed)
[facility] Admission History and Physical Service Pager: [phone]  Patient name: Ray Hall Medical record number: [id number] Date of birth: Nov 29, 1960 Age: 53 y.o. Gender: male  Primary Care Provider: MERRELL, DAVID, MD Consultants: None Code Status: Full Code  Chief Complaint: Altered mental status, Fever  Assessment and Plan: Ray Hall is a 53 y.o. male presenting with acute encephalopathy and fever.  PMH is significant for HTN, Chronic pain/Low back pain, DM-2, Hx of paroxysmal afib s/p ablation.  Acute encephalopathy - Given clinical presentation and workup (see below), this is likely secondary to underlying sepsis from meningitis.  However, there could be a component of opiate overuse given chronic narcotic use and improvement with narcan.   - Admit to SDU, Attending Dr. Lindell Noe - CT head negative - Cultures pending at this time.  Remainder of CSF studies also pending. LP did reveal elevated WBC count of 101 which could reflect meningitis.   - Will continue empiric treatment with Rocephin and Acyclovir.  Vanc added as it is recommended for empiric treatment of suspected meningitis - Will trend Lactic acid - IV fluids as below - Neuro checks Q4 hours  Sepsis - Patient with + SIRS (fever, tachycardia, elevated WBC) and likely source of infection = Sepsis. Suspect meningitis given history and presentation.   - Will continue treatment as outlined above - Rocephin, Vanc, Acyclovir - Awaiting cultures and CSF studies - Toradal Q6PRN and Tylenol PRN for fever - Will repeat Lactic acid - CBC and BMP later this am  LE edema; Warmth/?Erythema of RLE - Patient has chronic LE edema per wife; appears consistent with venous stasis - No signs other signs/symptoms suggestive of underlying CHF - Right LE was warm to touch. Will evaluate for DVT with LE dopplers.  - Abx as above.   HTN and Hx of Paroxysmal Afib - Continuing home Lisinopril and  Cardizem  DM-2 - Elevated CBG on arrival at 320; patient given 1 time dose of Novolog. - CBG's TIDAC and QHS - SSI Moderate during hospitalization  Low back pain/Chronic pain - Holding narcotics at this time  FEN/GI: NPO. NS @ 100 mL/hr Prophylaxis: Lovenox  Disposition: Pending clinical improvement.  History of Present Illness:  Ray Hall is a 52 y.o. male presenting with fever and altered mental status.  Given AMS, history was obtained primarily through the EMR and patient's wife.  Patient's wife reports that he was in his normal state of health until earlier today.  She noticed that he was more fatigued/tired than usual today.  States that he was "falling asleep all day."  This afternoon he and his wife proceeded to go to a concert for his grandson.  After leaving the concert, 8:52 PM patient reported that he was not feeling well.  He then developed sudden onset rigors and also had complaints of neck pain.  After turning home, given his fatigue/somnolence throughout the day and new onset rigors, EMS as called and patient was taken to the emergency department for evaluation.  Of note, patient has a longstanding history of chronic pain and is on chronic narcotics.  He has recently been experiencing significant pain in his back, knees, and shoulders.  His wife reports that she gave him some Flexeril earlier this afternoon.  Patient denies any narcotic use today and wife is also unaware of narcotic use today.  In the ED, patient was found to be lethargic and febrile (Tmax 103.1).  Given his past medical history there was  concern for opiate overdose and patient was given Narcan with improvement.  Additionally, given history, AMS and fever there was concern for meningitis so LP was done and patient was started on empiric Rocephin and Acyclovir.  Workup was significant for leukocytosis of 19.7 with left shift and lactic acidosis of 4.04.  Remainder of studies are below.    Review Of  Systems: Per HPI. Otherwise 12 point review of systems was performed and was unremarkable.  Patient Active Problem List   Diagnosis Date Noted  . Acute encephalopathy 08/27/2013  . Altered mental status 08/26/2013  . Musculoskeletal pain 08/09/2013  . Right leg swelling 04/09/2013  . Shoulder injury 11/06/2012  . Erectile dysfunction 11/06/2012  . Lower extremity pain, right 01/15/2012  . Paroxysmal atrial fibrillation 09/17/2010  . BENIGN PROSTATIC HYPERTROPHY, WITH OBSTRUCTION 05/25/2010  . LOW BACK PAIN SYNDROME, SEVERE 04/16/2010  . History of recurrent UTIs 12/09/2008  . DIABETES MELLITUS, TYPE II 06/26/2008  . HYPERTENSION 06/24/2008   Past Medical History: Past Medical History  Diagnosis Date  . Sickle cell trait   . Normal nuclear stress test 07/2010    low prob of ischemia, EF 42%  . Echocardiogram abnormal     moderate LVH, mild LV hypokenesis, EF 42%  . History of Doppler ultrasound 07/2010    negative for DVT  . Abnormal CT of the abdomen     mild L hydronephrosis and hydroureter  . Status post radiofrequency ablation for arrhythmia 10/16/10    [facility] Rucks MD)  . Diabetes mellitus   . Hypertension    Past Surgical History: Past Surgical History  Procedure Laterality Date  . Radiofrequency ablation  10/16/10    Cardiac for atrial arrythmia, [facility]  . Dental extractions  10/15/10    prior to ablation   Social History: History  Substance Use Topics  . Smoking status: Current Every Day Smoker -- 1.50 packs/day for 17 years    Types: Cigars  . Smokeless tobacco: Never Used     Comment: 2 cigars  . Alcohol Use: No   Please also refer to relevant sections of EMR.  Family History: Family History  Problem Relation Age of Onset  . Sickle cell trait    . Heart failure Father   . Diabetes Father   . Diabetes Mother   . Hypertension Mother    Allergies and Medications: No Known Allergies No current facility-administered medications on file prior to  encounter.   Current Outpatient Prescriptions on File Prior to Encounter  Medication Sig Dispense Refill  . Aspirin-Salicylamide-Caffeine (BC HEADACHE POWDER PO) Take 1 packet by mouth every 8 (eight) hours as needed (for pain).      . cyclobenzaprine (FLEXERIL) 10 MG tablet Take 1 tablet (10 mg total) by mouth 3 (three) times daily as needed. For muscle spasms.  90 tablet  6  . diltiazem (CARDIZEM) 60 MG tablet Take 1 tablet (60 mg total) by mouth 2 (two) times daily.  60 tablet  3  . furosemide (LASIX) 40 MG tablet Take 1 tablet (40 mg total) by mouth 3 (three) times a week.  30 tablet  3  . gabapentin (NEURONTIN) 100 MG capsule Take 1 capsule (100 mg total) by mouth 3 (three) times daily.  90 capsule  3  . glipiZIDE (GLUCOTROL) 10 MG tablet Take 1 tablet (10 mg total) by mouth 2 (two) times daily before a meal.  60 tablet  3  . HYDROcodone-acetaminophen (NORCO) 10-325 MG per tablet Take 1-2 tablets by  mouth at bedtime as needed.  60 tablet  0  . lisinopril (PRINIVIL,ZESTRIL) 20 MG tablet Take 1 tablet (20 mg total) by mouth daily.  90 tablet  3  . metFORMIN (GLUCOPHAGE) 1000 MG tablet Take 1 tablet (1,000 mg total) by mouth 2 (two) times daily with a meal.  180 tablet  3  . Multiple Vitamin (MULTIVITAMIN WITH MINERALS) TABS Take 1 tablet by mouth daily.      . naproxen sodium (ANAPROX) 220 MG tablet Take 440 mg by mouth 2 (two) times daily as needed (pain).       . traMADol (ULTRAM) 50 MG tablet Take 1-2 tablets (50-100 mg total) by mouth every 8 (eight) hours as needed.  60 tablet  3    Objective: BP 142/85  Pulse 106  Temp(Src) 101.1 F (38.4 C) (Oral)  Resp 22  Ht 6' 7.92" (2.03 m)  Wt 335 lb 1.6 oz (152 kg)  BMI 36.89 kg/m2  SpO2 97% Exam: General: somnolent, but easily arousable.  NAD.  HEENT: NCAT. No scleral icterus. Oropharynx clear.  Cardiovascular: Tachycardic. Regular rhythm. No murmur appreciated. Respiratory: Bibasilar rales R>L.  Abdomen: soft, nontender,  nondistended. No palpable organomegaly.  Extremities: 2+ pitting edema of both lower extremities up to the knees.  Right LE - warmth noted; erythema difficult to appreciate; 5th toe with superificial ulceration noted on dorsal aspect.  Left LE - macerated area noted between 4th and 5th toes. Neuro: somnolent but easily arousable.  Oriented to person, place, and time.  CN 2-12 grossly intact.  Muscle strength 5/5 in all extremities.  Sensation grossly intact.   Labs and Imaging: CBC BMET   Recent Labs Lab 08/26/13 2030  WBC 19.7*  HGB 12.6*  HCT 35.2*  PLT 247    Recent Labs Lab 08/26/13 2030  NA 133*  K 4.4  CL 94*  CO2 21  BUN 16  CREATININE 0.97  GLUCOSE 432*  CALCIUM 9.3     Urinalysis    Component Value Date/Time   COLORURINE YELLOW 08/26/2013 2138   APPEARANCEUR CLEAR 08/26/2013 2138   LABSPEC 1.026 08/26/2013 2138   PHURINE 5.5 08/26/2013 2138   GLUCOSEU >1000* 08/26/2013 2138   HGBUR SMALL* 08/26/2013 2138   HGBUR negative 04/16/2010 1058   BILIRUBINUR NEGATIVE 08/26/2013 2138   BILIRUBINUR NEGATIVE 04/09/2013 1515   KETONESUR NEGATIVE 08/26/2013 2138   PROTEINUR NEGATIVE 08/26/2013 2138   PROTEINUR NEGATIVE 04/09/2013 1515   UROBILINOGEN 1.0 08/26/2013 2138   UROBILINOGEN 0.2 04/09/2013 1515   NITRITE NEGATIVE 08/26/2013 2138   NITRITE NEGATIVE 04/09/2013 1515   LEUKOCYTESUR NEGATIVE 08/26/2013 2138   Drugs of Abuse     Component Value Date/Time   LABOPIA POSITIVE* 08/26/2013 2138   COCAINSCRNUR NONE DETECTED 08/26/2013 2138   LABBENZ NONE DETECTED 08/26/2013 2138   AMPHETMU NONE DETECTED 08/26/2013 2138   THCU NONE DETECTED 08/26/2013 2138   LABBARB NONE DETECTED 08/26/2013 2138    Cardiac Panel (last 3 results)  Recent Labs  08/26/13 2030  TROPONINI <0.30   Lactic Acid - 4.04  Micro: Urine Culture - Pending Blood Culture x 2 - Pending CSF Culture - Pending CSF studies - WBC count 101, RBC count 10127; remainder of studies pending at this time (glucose,  protein, HSV, Gram stain)  Ct Head Wo Contrast 08/26/2013    IMPRESSION: There is no evidence of an acute intracranial hemorrhage nor of an evolving ischemic event. There is no intracranial mass effect or hydrocephalus.  Dg Chest [facility] [address]  08/26/2013  IMPRESSION: No acute disease.     Coral Spikes, DO 08/27/2013, 2:44 AM PGY-2, [place] Intern pager: [phone], text pages welcome

## 2013-08-27 NOTE — H&P (Signed)
FMTS Attending Admit Note Patient seen and examined by me, discussed with Dr Lacinda Axon and I agree with his assess/plan. Patient is alert and awake for me this morning, is oriented to date, and place.  Of note, CSF not reporting cell count, protein or glucose due to insufficient quantity.  Patient is improving on vanc, rocephin and acyclovir.  Plan to continue treatment for presumed meningitis, as he is improving.  Agree with RLE doppler due to increased warmth and swelling and erythema.  Patient appears very dry, recommend another NS bolus. Dalbert Mayotte, MD

## 2013-08-27 NOTE — Progress Notes (Signed)
VASCULAR LAB PRELIMINARY  PRELIMINARY  PRELIMINARY  PRELIMINARY  Right lower extremity venous duplex completed.    Preliminary report:  Right:  No evidence of DVT, superficial thrombosis, or Baker's cyst.  Nani Ravens, RVT 08/27/2013, 1:13 PM

## 2013-08-27 NOTE — ED Notes (Signed)
MD paged per Receiving floor RN, about covering blood sugar before transferring.

## 2013-08-27 NOTE — Progress Notes (Signed)
Family Medicine Teaching Service Daily Progress Note Intern Pager: [phone]  Patient name: Ray Hall Medical record number: [id number] Date of birth: [birth year] Age: 53 y.o. Gender: male  Primary Care Provider: Linna Darner, MD Consultants: None Code Status: Full  Pt Overview and Major Events to Date:  4/30: Admitted with encephalopathy and fever; vanc/CTX/acyclovir started for meningitis 5/1: LE doppler  Assessment and Plan: Ray Hall is a 53 y.o. male presenting with acute encephalopathy and fever. PMH is significant for HTN, Chronic pain/Low back pain, DM-2, Hx of paroxysmal afib s/p ablation.   Acute encephalopathy - Suspect meningitis/encephalitis: LP with insufficient volume for prot/glu. But with pleocytosis (after correction for traumatic tap). Await cultures. Also certainly possible due to opioids, given improvement with narcan. - CT head negative, LFTs wnl, +opiates otherwise negative UDS, EtOH neg - Cultures pending at this time.  - Van/CTX/acyclovir (4/30- ) - Lactate now wnl - IV fluids as below  - Hold sedating medications  Sepsis - Patient with + SIRS (fever, tachycardia, elevated WBC) and likely source of infection (CSF pleocytosis) = Sepsis. Suspect meningitis given history and presentation.  - Will continue treatment as outlined above: Rocephin, Vanc, Acyclovir  - Awaiting cultures of blood, urine and CSF - Obtaining RPR and HSV blood Ab to rule out alternative causes of encephalitis  - Toradal Q6PRN and Tylenol PRN for fever - f/u CBC, BMP tomorrow AM  Dehydration: Dry MM and creatinine elevated from baseline. N - NS bolus repeated this AM, IVF bumped to 3/4 maintenance and starting diet at lunch - Strict I/O to monitor UOP.   Concern for cellulitis of RLE in diabetic pt - Patient has chronic LE edema per wife; appears consistent with venous stasis  - Will evaluate for DVT with LE dopplers.  - Add anaerobic coverage with flagyl if dopplers  negative   HTN and Hx of Paroxysmal Afib  - Continuing home Cardizem 60mg  BID; will recommend QID dosing as outpatient and observe BPs - Holding ACE given dehydration; pending improvement.   T2DM:   - Elevated CBG on arrival at 432 now down to 148 - CBG's TIDAC and QHS  - SSI Moderate during hospitalization. Hold lantus while NPO  Low back pain/Chronic pain  - Holding narcotics at this time   FEN/GI: Heart healthy NS @ 150 mL/hr (~3/4 maintenance) Prophylaxis: Lovenox   Disposition: Pending clinical improvement.  Subjective:  Mr. Heindel was rousable but somnolent this morning, oriented and with appropriate answers with frequent prompting. No wife at bedside. Denies HA, neck pain/stiffness. No chest pain.   Objective: Temp:  [98.9 F (37.2 C)-103.1 F (39.5 C)] 100.3 F (37.9 C) (05/01 0747) Pulse Rate:  [95-120] 95 (05/01 0747) Resp:  [0-26] 16 (05/01 0747) BP: (99-181)/(55-94) 153/79 mmHg (05/01 0747) SpO2:  [89 %-97 %] 97 % (05/01 0747) Weight:  [335 lb 1.6 oz (152 kg)] 335 lb 1.6 oz (152 kg) (04/30 2204) Physical Exam: General: Somnolent but rousable obese 53 yo male HEENT: Dry mucous membranes Cardiovascular: RRR, no murmur Respiratory: Nonlabored, CTAB Abdomen: Obese, NT, ND. Normoactive BS Extremities: Warm, Bilateral 2+ pitting edema in LE's. RLE warm and erythematous lower shin to just below knee without discrete borders. DP pulse 1+ limited by edema. Chronic venous stasis changes. No hair.  Neuro: Oriented x3, CN II-XII intact grossly, SILT x4 extremities, strength 4/5 x 4 extremities  Laboratory:  Recent Labs Lab 08/26/13 2030 08/27/13 0525  WBC 19.7* 17.6*  HGB 12.6* 11.5*  HCT 35.2* 32.6*  PLT 247 218    Recent Labs Lab 08/26/13 2030 08/27/13 0525  NA 133* 140  K 4.4 3.8  CL 94* 103  CO2 21 22  BUN 16 19  CREATININE 0.97 1.13  CALCIUM 9.3 8.5  PROT 7.5  --   BILITOT 0.2*  --   ALKPHOS 72  --   ALT 19  --   AST 18  --   GLUCOSE 432* 195*      Recent Labs Lab 08/26/13 2030  TROPONINI <0.30    Urine Culture - Pending  Blood Culture x 2 - Pending  CSF Culture - Pending  CSF studies - WBC count 101, RBC count 10127; remainder of studies pending at this time (glucose, protein, HSV, Gram stain)   Ct Head Wo Contrast  08/26/2013 IMPRESSION: There is no evidence of an acute intracranial hemorrhage nor of an evolving ischemic event. There is no intracranial mass effect or hydrocephalus.   Dg Chest Port 1 View  08/26/2013 IMPRESSION: No acute disease.   Vance Gather, MD 08/27/2013, 9:47 AM PGY-1, [place] Intern pager: [phone], text pages welcome

## 2013-08-27 NOTE — Progress Notes (Signed)
ANTIBIOTIC CONSULT NOTE - INITIAL  Pharmacy Consult for Vancomycin  Indication: r/o meningitis  No Known Allergies  Patient Measurements: Height: 6' 7.92" (203 cm) Weight: 335 lb 1.6 oz (152 kg) IBW/kg (Calculated) : 95.82  Vital Signs: Temp: 101.1 F (38.4 C) (05/01 0215) Temp src: Oral (05/01 0215) BP: 129/75 mmHg (05/01 0330) Pulse Rate: 103 (05/01 0330)  Labs:  Recent Labs  08/26/13 2030  WBC 19.7*  HGB 12.6*  PLT 247  CREATININE 0.97   Estimated Creatinine Clearance: 152.4 ml/min (by C-G formula based on Cr of 0.97).   Microbiology: Recent Results (from the past 720 hour(s))  CSF CULTURE     Status: None   Collection Time    08/26/13 11:15 PM      Result Value Ref Range Status   Specimen Description CSF   Final   Special Requests NONE   Final   Gram Stain     Final   Value: WBC PRESENT, PREDOMINANTLY PMN     NO ORGANISMS SEEN     CYTOSPIN     Performed at [facility]   Culture PENDING   Incomplete   Report Status PENDING   Incomplete   Medical History: Past Medical History  Diagnosis Date  . Sickle cell trait   . Normal nuclear stress test 07/2010    low prob of ischemia, EF 42%  . Echocardiogram abnormal     moderate LVH, mild LV hypokenesis, EF 42%  . History of Doppler ultrasound 07/2010    negative for DVT  . Abnormal CT of the abdomen     mild L hydronephrosis and hydroureter  . Status post radiofrequency ablation for arrhythmia 10/16/10    [facility] MD)  . Diabetes mellitus   . Hypertension    Assessment: 53 y/o with concern for meningitis to start broad spectrum antimicrobial coverage. WBC 19.7, renal function good, other labs as above.   Goal of Therapy:  Vancomycin trough level 15-20 mcg/ml  Plan:  -Vancomycin 2000 mg IV x 1, then 1500 mg IV q12h -Ceftriaxone/Acycolvir per MD -Trend WBC, temp, renal function  -Drug levels as indicated   Narda Bonds 08/27/2013,3:42 AM

## 2013-08-27 NOTE — ED Notes (Signed)
After given Narcan, pt. A&O to person and place. Pt talking and moving all extremities. Pt. Insisted on standing to urinate in urinal. 3 person assist while pt. Stood. Pt steady when standing.

## 2013-08-27 NOTE — Progress Notes (Signed)
Utilization review completed.  

## 2013-08-28 DIAGNOSIS — R4182 Altered mental status, unspecified: Secondary | ICD-10-CM

## 2013-08-28 LAB — BASIC METABOLIC PANEL
BUN: 14 mg/dL (ref 6–23)
CO2: 20 meq/L (ref 19–32)
CREATININE: 0.9 mg/dL (ref 0.50–1.35)
Calcium: 7.9 mg/dL — ABNORMAL LOW (ref 8.4–10.5)
Chloride: 100 mEq/L (ref 96–112)
GFR calc Af Amer: >90 mL/min (ref >90)
GFR calc non Af Amer: >90 mL/min (ref >90)
GLUCOSE: 235 mg/dL — AB (ref 70–99)
Potassium: 3.8 mEq/L (ref 3.7–5.3)
Sodium: 134 mEq/L — ABNORMAL LOW (ref 137–147)

## 2013-08-28 LAB — URINE CULTURE
CULTURE: NO GROWTH
Colony Count: NO GROWTH

## 2013-08-28 LAB — CBC
HCT: 30.1 % — ABNORMAL LOW (ref 39.0–52.0)
Hemoglobin: 10.5 g/dL — ABNORMAL LOW (ref 13.0–17.0)
MCH: 28 pg (ref 26.0–34.0)
MCHC: 34.9 g/dL (ref 30.0–36.0)
MCV: 80.3 fL (ref 78.0–100.0)
Platelets: 189 10*3/uL (ref 150–400)
RBC: 3.75 MIL/uL — AB (ref 4.22–5.81)
RDW: 12.7 % (ref 11.5–15.5)
WBC: 9 10*3/uL (ref 4.0–10.5)

## 2013-08-28 LAB — GLUCOSE, CAPILLARY
GLUCOSE-CAPILLARY: 287 mg/dL — AB (ref 70–99)
GLUCOSE-CAPILLARY: 280 mg/dL — AB (ref 70–99)
Glucose-Capillary: 237 mg/dL — ABNORMAL HIGH (ref 70–99)
Glucose-Capillary: 274 mg/dL — ABNORMAL HIGH (ref 70–99)

## 2013-08-28 MED ORDER — INSULIN GLARGINE 100 UNIT/ML ~~LOC~~ SOLN
15.0000 [IU] | Freq: Every day | SUBCUTANEOUS | Status: DC
  Administered 2013-08-28: 15 [IU] via SUBCUTANEOUS
  Filled 2013-08-28 (×2): qty 0.15

## 2013-08-28 NOTE — Progress Notes (Signed)
Discussed the case on the phone with Dr. Megan Salon, [place].  He recommends IV acyclovir and Rocephin for 7-10 days minimum and the bloodstream Antibodies for herpes, even if negative, should not guide duration of the acylcovir.  If need a formal consult, they are glad to help.  Otherwise, PICC with 10 day therapy of rocephin and acylcovir.  Tamela Oddi Awanda Mink, DO of [facility] 08/28/2013, 3:27 PM

## 2013-08-28 NOTE — Progress Notes (Signed)
Family Medicine Teaching Service Daily Progress Note Intern Pager: [phone]  Patient name: Ray Hall Medical record number: [id number] Date of birth: 1960-09-30 Age: 53 y.o. Gender: male  Primary Care Provider: Linna Darner, MD Consultants: None Code Status: Full  Pt Overview and Major Events to Date:  4/30: Admitted with encephalopathy and fever; vanc/CTX/acyclovir started for possible meningitis 5/1: LE doppler - neg for DVT 5/2:   Assessment and Plan: Ray Hall is a 53 y.o. male presenting with acute encephalopathy and fever. PMH is significant for HTN, Chronic pain/Low back pain, DM-2, Hx of paroxysmal afib s/p ablation.   Acute Toxic Metabolic encephalopathy, possibly due to sepsis- Possible meningitis/encephalitis: LP with insufficient volume for prot/glu. But with pleocytosis (after correction for traumatic tap). Await cultures. Also certainly possible due to opioids, given improvement with narcan. - CT head negative, LFTs wnl, +opiates otherwise negative UDS, EtOH neg - UCx NGTD, F/U on CSF Cx, BCx and HSV Antibodies to possible r/o HSV encephalitis if negative - F/U RPR  - Van/CTX/acyclovir (4/30- ) - IV fluids as below  - Hold sedating medications  Dehydration:  - Resolved, PO as tolerates - Strict I/O to monitor UOP.   Concern for cellulitis of RLE in diabetic pt - Patient has chronic LE edema per wife; appears consistent with venous stasis  - LE Doppler negative for DVT - Add anaerobic coverage with flagyl   HTN and Hx of Paroxysmal Afib  - Continuing home Cardizem 60mg  BID; Recommend possibly switching to Cardizem XL  - Holding ACE given dehydration; pending improvement.   T2DM:   - Elevated CBG on arrival at 432 now down to 148 - CBG's TID AC and QHS  - SSI Moderate during hospitalization. Restart Lantus at 15 U and titrate accordingly as starting diet  Low back pain/Chronic pain  - Holding narcotics at this time   FEN/GI: Heart healthy,  SLIV Prophylaxis: Lovenox   Disposition: Pending clinical improvement.  Subjective:  Mr. Neely awake this AM.  Denies HA, neck pain/stiffness. No chest pain.   Objective: Temp:  [98.6 F (37 C)-100.1 F (37.8 C)] 98.6 F (37 C) (05/02 0814) Pulse Rate:  [87-98] 95 (05/01 2342) Resp:  [0-27] 23 (05/02 0700) BP: (121-153)/(56-92) 143/92 mmHg (05/02 0814) SpO2:  [96 %-99 %] 97 % (05/02 0814) Physical Exam: General: AAO x 3, NAD HEENT: MMM Cardiovascular: RRR, no murmur Respiratory: Nonlabored, CTAB Abdomen: Obese, NT, ND. Normoactive BS Extremities: Warm, Bilateral 2+ pitting edema in LE's. RLE warm and erythematous lower shin to just below knee without discrete borders. DP pulse 1+ limited by edema. Chronic venous stasis changes. No hair.  Neuro: CN II-XII intact grossly, strength 5/5 x 4 extremities  Laboratory:  Recent Labs Lab 08/26/13 2030 08/27/13 0525 08/28/13 0315  WBC 19.7* 17.6* 9.0  HGB 12.6* 11.5* 10.5*  HCT 35.2* 32.6* 30.1*  PLT 247 218 189    Recent Labs Lab 08/26/13 2030 08/27/13 0525 08/28/13 0315  NA 133* 140 134*  K 4.4 3.8 3.8  CL 94* 103 100  CO2 21 22 20   BUN 16 19 14   CREATININE 0.97 1.13 0.90  CALCIUM 9.3 8.5 7.9*  PROT 7.5  --   --   BILITOT 0.2*  --   --   ALKPHOS 72  --   --   ALT 19  --   --   AST 18  --   --   GLUCOSE 432* 195* 235*      Recent Labs Lab  08/26/13 2030 08/27/13 1154 08/27/13 1555 08/27/13 2149  TROPONINI <0.30 <0.30 <0.30 <0.30    Urine Culture - NGTD Blood Culture x 2 - Pending  CSF Culture - Pending  CSF studies - WBC count 101, RBC count 10127, CSF Cx pending   Ct Head Wo Contrast  08/26/2013 IMPRESSION: There is no evidence of an acute intracranial hemorrhage nor of an evolving ischemic event. There is no intracranial mass effect or hydrocephalus.   Dg Chest Port 1 View  08/26/2013 IMPRESSION: No acute disease.   Nolon Rod, DO 08/28/2013, 8:35 AM PGY-2, [place]  Intern pager: [phone], text pages welcome

## 2013-08-28 NOTE — Progress Notes (Signed)
FMTS Attending Note  I personally saw and evaluated the patient. The plan of care was discussed with the resident team. I agree with the assessment and plan as documented by the resident.   53 y/o male admitted with AMS/encephalopathy thought to be secondary to Meningitis. CSF has WBC's present however no organisms and culture negative to day. Patient currently on Vanco/Rocephin/Acyclovir. Not enough CSF was collected to check for HSV. Patient is responding to therapy, Alert and Oriented to person/place/time/president today. HSV blood antibodies pending and blood cultures pending. I have asked the resident team to discuss this case with the ID team to discuss length of therapy of antibiotics/acyclovir given negative CSF cultures.   Dossie Arbour MD

## 2013-08-29 ENCOUNTER — Inpatient Hospital Stay (HOSPITAL_COMMUNITY): Payer: BC Managed Care – PPO

## 2013-08-29 LAB — GLUCOSE, CAPILLARY
GLUCOSE-CAPILLARY: 95 mg/dL (ref 70–99)
GLUCOSE-CAPILLARY: 103 mg/dL — AB (ref 70–99)
Glucose-Capillary: 207 mg/dL — ABNORMAL HIGH (ref 70–99)
Glucose-Capillary: 375 mg/dL — ABNORMAL HIGH (ref 70–99)

## 2013-08-29 MED ORDER — SODIUM CHLORIDE 0.9 % IJ SOLN
10.0000 mL | INTRAMUSCULAR | Status: DC | PRN
  Administered 2013-08-30: 10 mL

## 2013-08-29 MED ORDER — DILTIAZEM HCL 60 MG PO TABS: 60.0000 mg | ORAL_TABLET | Freq: Four times a day (QID) | ORAL | Status: DC

## 2013-08-29 MED ORDER — SODIUM CHLORIDE 0.9 % IJ SOLN
10.0000 mL | Freq: Two times a day (BID) | INTRAMUSCULAR | Status: DC
  Administered 2013-08-29: 10 mL

## 2013-08-29 MED ORDER — INSULIN GLARGINE 100 UNIT/ML ~~LOC~~ SOLN
25.0000 [IU] | Freq: Every day | SUBCUTANEOUS | Status: DC
  Administered 2013-08-29 – 2013-08-30 (×2): 25 [IU] via SUBCUTANEOUS
  Filled 2013-08-29 (×4): qty 0.25

## 2013-08-29 MED ORDER — GADOBENATE DIMEGLUMINE 529 MG/ML IV SOLN
20.0000 mL | Freq: Once | INTRAVENOUS | Status: AC | PRN
  Administered 2013-08-29: 20 mL via INTRAVENOUS

## 2013-08-29 NOTE — Progress Notes (Signed)
Paged resident pertaining to patient's elevated CBG without coverage.  Pt CBG=274 (1700), 280 (2100).  Given Novolog 8U at approx 1730 and none given since.  Pt takes Glucotrol and Glucophage at home - not on currently.  Lantus added 5/2 AM, but no dec in CBG in PM.  Waiting for MD to call back.  Will continue to assess.

## 2013-08-29 NOTE — Progress Notes (Signed)
08/29/13 2312    Notified MD on call, Lochlan Grygiel  About pt having multiple runs of V Tach. Pt is asymptomatic. He recommended that we monitor pt at the moment and call back if he has some more. Ray Hall

## 2013-08-29 NOTE — Progress Notes (Signed)
Peripherally Inserted Central Catheter/Midline Placement  The IV Nurse has discussed with the patient and/or persons authorized to consent for the patient, the purpose of this procedure and the potential benefits and risks involved with this procedure.  The benefits include less needle sticks, lab draws from the catheter and patient may be discharged home with the catheter.  Risks include, but not limited to, infection, bleeding, blood clot (thrombus formation), and puncture of an artery; nerve damage and irregular heat beat.  Alternatives to this procedure were also discussed.  PICC/Midline Placement Documentation  PICC / Midline Single Lumen 08/29/13 PICC Right Brachial 55 cm 1 cm (Active)  Indication for Insertion or Continuance of Line Home intravenous therapies (PICC only) 08/29/2013 12:41 PM  Exposed Catheter (cm) 1 cm 08/29/2013 12:41 PM  Line Status Flushed;Saline locked;Blood return noted 08/29/2013 12:41 PM  Dressing Change Due 09/05/13 08/29/2013 12:41 PM       Gordan Payment 08/29/2013, 12:52 PM

## 2013-08-29 NOTE — Progress Notes (Signed)
Family Medicine Teaching Service Daily Progress Note Intern Pager: [phone]  Patient name: Ray Hall Medical record number: [id number] Date of birth: November 27, 1960 Age: 53 y.o. Gender: male  Primary Care Provider: Linna Darner, MD Consultants: None Code Status: Full  Pt Overview and Major Events to Date:  4/30: Admitted with encephalopathy and fever; vanc/CTX/acyclovir started for possible meningitis 5/1: LE doppler - neg for DVT 5/2: BCx, UCx, and CSF Cx NGTD  Assessment and Plan: DACOTAH CABELLO is a 53 y.o. male presenting with acute encephalopathy and fever. PMH is significant for HTN, Chronic pain/Low back pain, DM-2, Hx of paroxysmal afib s/p ablation.   Acute Toxic Metabolic encephalopathy, possibly due to sepsis- Possible meningitis/encephalitis: LP with insufficient volume for prot/glu. But with pleocytosis (after correction for traumatic tap). Await cultures. Also certainly possible due to opioids, given improvement with narcan. - CT head negative, LFTs wnl, +opiates otherwise negative UDS, EtOH neg - UCx NGTD, CSF and BCx NGTD - F/U RPR  - Van/CTX/acyclovir (4/30- ).  Discussed case with Dr. Megan Salon, ID, who recommends 7-10 days of CTX/Acylocvir even if HSV antibodies are negative.  Does not feel MRSA is high likely given case.  Order PICC and HHRN placed  - Hold sedating medications  Dehydration:  - Resolved, PO as tolerates - Strict I/O to monitor UOP.   Concern for cellulitis of RLE in diabetic pt - Patient has chronic LE edema per wife; appears consistent with venous stasis  - LE Doppler negative for DVT - Add anaerobic coverage with flagyl   HTN and Hx of Paroxysmal Afib  - Continuing home Cardizem 60mg  BID; Recommend possibly switching to Cardizem XL  - Holding ACE given dehydration; pending improvement.   T2DM:   - Elevated CBG on arrival at 432 n - CBG's TID AC and QHS  - SSI Moderate during hospitalization. Increase Lantus from 15 U to 25 U this AM    Low back pain/Chronic pain  - Holding narcotics at this time   FEN/GI: Heart healthy, SLIV Prophylaxis: Lovenox   Disposition: Possible d/c today with PICC and 10 more days of IV ABx, home health RN   Subjective:  Doing well this AM.  Denies HA, neck pain/stiffness. No chest pain.   Objective: Temp:  [97.8 F (36.6 C)-98.8 F (37.1 C)] 98.3 F (36.8 C) (05/03 0425) Resp:  [11-23] 11 (05/03 0430) BP: (118-165)/(58-92) 118/58 mmHg (05/03 0430) SpO2:  [97 %-98 %] 98 % (05/03 0425) Physical Exam: General: AAO x 3, NAD HEENT: MMM Cardiovascular: RRR, no murmur Respiratory: Nonlabored, CTAB Abdomen: Obese, NT, ND. Normoactive BS Extremities: WWP, trace edema B/L  Neuro: CN II-XII intact grossly, strength 5/5 x 4 extremities  Laboratory:  Recent Labs Lab 08/26/13 2030 08/27/13 0525 08/28/13 0315  WBC 19.7* 17.6* 9.0  HGB 12.6* 11.5* 10.5*  HCT 35.2* 32.6* 30.1*  PLT 247 218 189    Recent Labs Lab 08/26/13 2030 08/27/13 0525 08/28/13 0315  NA 133* 140 134*  K 4.4 3.8 3.8  CL 94* 103 100  CO2 21 22 20   BUN 16 19 14   CREATININE 0.97 1.13 0.90  CALCIUM 9.3 8.5 7.9*  PROT 7.5  --   --   BILITOT 0.2*  --   --   ALKPHOS 72  --   --   ALT 19  --   --   AST 18  --   --   GLUCOSE 432* 195* 235*      Recent Labs Lab 08/26/13 2030  08/27/13 1154 08/27/13 1555 08/27/13 2149  TROPONINI <0.30 <0.30 <0.30 <0.30    Urine Culture - NGTD Blood Culture x 2 - Pending  CSF Culture - Pending  CSF studies - WBC count 101, RBC count 10127, CSF Cx pending   Ct Head Wo Contrast  08/26/2013 IMPRESSION: There is no evidence of an acute intracranial hemorrhage nor of an evolving ischemic event. There is no intracranial mass effect or hydrocephalus.   Dg Chest Port 1 View  08/26/2013 IMPRESSION: No acute disease.   Nolon Rod, DO 08/29/2013, 7:00 AM PGY-2, [place] Intern pager: [phone], text pages welcome

## 2013-08-29 NOTE — Progress Notes (Signed)
FMTS Attending Note  I personally saw and evaluated the patient. The plan of care was discussed with the resident team. I agree with the assessment and plan as documented by the resident.   Patient has painful/draining/erythemetous left 5th/4th digits, consistent with cellulitis however ca not rule out osteomyelitis at this time. As this may have contributed to his encephalopathy will check MRI left foot to rule out osteomyelitis.   Encephalopathy is resolved, patient to have PICC placed today for home Rocephin/Acyclovir. Await MRI foot which if positive patient will require 6 weeks of antibiotic therapy. Patient will at minimum need 10 days of IV antibiotics (Rocephin and Acyclovir) as there is sufficient evidence to suggest meningitis.   Dossie Arbour MD

## 2013-08-30 LAB — GLUCOSE, CAPILLARY
GLUCOSE-CAPILLARY: 201 mg/dL — AB (ref 70–99)
GLUCOSE-CAPILLARY: 151 mg/dL — AB (ref 70–99)
Glucose-Capillary: 134 mg/dL — ABNORMAL HIGH (ref 70–99)

## 2013-08-30 LAB — CSF CULTURE: Culture: NO GROWTH

## 2013-08-30 LAB — CSF CULTURE W GRAM STAIN

## 2013-08-30 LAB — HSV(HERPES SMPLX)ABS-I+II(IGG+IGM)-BLD
HERPES SIMPLEX VRS I-IGM AB (EIA): 1.52 {index} — AB
HSV 1 Glycoprotein G Ab, IgG: 0.15 IV
HSV 2 GLYCOPROTEIN G AB, IGG: 5.27 IV — AB

## 2013-08-30 MED ORDER — DEXTROSE 5 % IV SOLN: 2.0000 g | Freq: Two times a day (BID) | INTRAVENOUS | Status: DC

## 2013-08-30 MED ORDER — HEPARIN SOD (PORK) LOCK FLUSH 100 UNIT/ML IV SOLN
250.0000 [IU] | INTRAVENOUS | Status: AC | PRN
  Administered 2013-08-30: 250 [IU]

## 2013-08-30 MED ORDER — MUPIROCIN 2 % EX OINT: 1.0000 "application " | TOPICAL_OINTMENT | Freq: Two times a day (BID) | CUTANEOUS | Status: AC

## 2013-08-30 MED ORDER — DEXTROSE 5 % IV SOLN: 10.0000 mg/kg | Freq: Three times a day (TID) | INTRAVENOUS | Status: DC

## 2013-08-30 MED ORDER — CEFTRIAXONE SODIUM 2 G IJ SOLR: 2.0000 g | Freq: Two times a day (BID) | INTRAMUSCULAR | Status: AC

## 2013-08-30 MED ORDER — DEXTROSE 5 % IV SOLN
10.0000 mg/kg | Freq: Three times a day (TID) | INTRAVENOUS | Status: AC
Start: 2013-08-30 — End: 2013-09-05

## 2013-08-30 NOTE — Care Management Note (Signed)
    Page 1 of 1   08/30/2013     12:13:30 PM CARE MANAGEMENT NOTE 08/30/2013  Patient:  HARVY, RIERA   Account Number:  [id number]  Date Initiated:  08/30/2013  Documentation initiated by:  GRAVES-BIGELOW,Jaxsen Bernhart  Subjective/Objective Assessment:   Pt admitted for fever, tachycardia, elevated WBC. Pt is from home with wife. Plan for d/c today with IV ABX.     Action/Plan:   RN CM did make referral with [facility] for [facility] services. SOC to begin within 24-48 hrs post d/c. MD pt will need Rx for IV ABX therapy.   Anticipated DC Date:  08/30/2013   Anticipated DC Plan:  [place]  CM consult      [facility] Choice  HOME HEALTH   Choice offered to / List presented to:  C-1 Patient        [place] arranged  HH-1 RN  [place].   Status of service:  Completed, signed off Medicare Important Message given?   (If response is "NO", the following Medicare IM given date fields will be blank) Date Medicare IM given:   Date Additional Medicare IM given:    Discharge Disposition:  [place]  Per UR Regulation:  Reviewed for med. necessity/level of care/duration of stay  If discussed at [place] of Stay Meetings, dates discussed:    Comments:

## 2013-08-30 NOTE — Discharge Instructions (Signed)
Ray Hall, we are glad you are feeling better.  We are treating you for a possible meningitis, in which there is an infection causing inflammation around your brain.  Your test did not necessarily show this but they did not rule this out either.  Therefore we will give you antibiotics for another 10 days to treat a possible infection, and this will be done through the IV.  Please follow up with Dr. Marily Memos.  Regarding your afib: it is the medical teams recommendation to start anticoagulation (blood thinning) with Xarelto to decrease your risk of having a stroke in the future. We recommend starting this now but it is your right to hold off until your visit with Dr. Marily Memos. We are also recommending to Dr. Marily Memos that you be established with a cardiologist as an outpatient.

## 2013-08-30 NOTE — Progress Notes (Signed)
Family Medicine Teaching Service Daily Progress Note Intern Pager: [phone]  Patient name: Ray Hall Medical record number: [id number] Date of birth: [birth year] Age: 53 y.o. Gender: male  Primary Care Provider: MERRELL, DAVID, MD Consultants: None Code Status: Full  Pt Overview and Major Events to Date:  4/30: Admitted with encephalopathy and fever; vanc/CTX/acyclovir started for possible meningitis 5/1: LE doppler - neg for DVT 5/2: BCx, UCx, and CSF Cx NGTD 5/3: MRI foot negative for osteo  Assessment and Plan: Ray Hall is a 53 y.o. male presenting with acute encephalopathy and fever. PMH is significant for HTN, Chronic pain/Low back pain, DM-2, Hx of paroxysmal afib s/p ablation.   Acute Toxic Metabolic encephalopathy, possibly due to sepsis- Possible meningitis/encephalitis: LP with insufficient volume for prot/glu. But with pleocytosis (after correction for traumatic tap). Await cultures. Also certainly possible due to opioids, given improvement with narcan. - CT head negative, LFTs wnl, +opiates otherwise negative UDS, EtOH neg - UCx NGTD, CSF and BCx NGTD - F/U RPR  - Van/CTX/acyclovir (4/30- ).  Discussed case with Dr. Megan Salon, ID, who recommends 7-10 days of CTX/Acylocvir even if HSV antibodies are negative.  Does not feel MRSA is high likely given case.  PICC placed, HHRN ordered - Hold sedating medications  Dehydration:  - Resolved, PO as tolerates - Strict I/O to monitor UOP.   Concern for cellulitis of RLE in diabetic pt - Patient has chronic LE edema per wife; appears consistent with venous stasis  - LE Doppler negative for DVT - Add anaerobic coverage with flagyl   Left foot 4th/5th digit cellulitis/drainage - MRI negative for Osteo, improved tenderness today  HTN and Hx of Paroxysmal Afib  - Continuing home Cardizem 60mg  BID; Recommend possibly switching to Cardizem XL  - Holding ACE given dehydration; pending improvement.   T2DM:  - CBG's TID  AC and QHS  - SSI Moderate during hospitalization. Increase Lantus from 15 U to 25 U this AM   Low back pain/Chronic pain  - Holding narcotics at this time   FEN/GI: Heart healthy, SLIV Prophylaxis: Lovenox   Disposition: discharge today with HH.  Subjective:  No complaints this morning. Denies pain, dizziness, CP, SOB.  Objective: Temp:  [98.2 F (36.8 C)-99.5 F (37.5 C)] 98.2 F (36.8 C) (05/04 0614) Pulse Rate:  [76-83] 76 (05/04 0614) Resp:  [13-21] 18 (05/04 0614) BP: (137-166)/(76-87) 140/81 mmHg (05/04 0614) SpO2:  [97 %-100 %] 98 % (05/04 0814) Physical Exam: General: AAO x 3, NAD HEENT: MMM Cardiovascular: RRR, no murmur Respiratory: Nonlabored, CTAB Abdomen: Obese, NT, ND. Normoactive BS Extremities: WWP, trace edema B/L. Small ulcer between left 4th/5th digits of foot, mildly tender to palpation, no significant swelling or erythema. Neuro: alert and oriented, no focal deficits.  Laboratory:  Recent Labs Lab 08/26/13 2030 08/27/13 0525 08/28/13 0315  WBC 19.7* 17.6* 9.0  HGB 12.6* 11.5* 10.5*  HCT 35.2* 32.6* 30.1*  PLT 247 218 189    Recent Labs Lab 08/26/13 2030 08/27/13 0525 08/28/13 0315  NA 133* 140 134*  K 4.4 3.8 3.8  CL 94* 103 100  CO2 21 22 20   BUN 16 19 14   CREATININE 0.97 1.13 0.90  CALCIUM 9.3 8.5 7.9*  PROT 7.5  --   --   BILITOT 0.2*  --   --   ALKPHOS 72  --   --   ALT 19  --   --   AST 18  --   --   GLUCOSE  [place] Lab 08/26/13 2030 08/27/13 1154 08/27/13 1555 08/27/13 2149  TROPONINI <0.30 <0.30 <0.30 <0.30    Urine Culture (4/30) - No growth Blood Culture (4/30) x 2 - Pending, NGTD  CSF Culture - Pending  CSF studies - WBC count 101, RBC count 10127, CSF Cx pending HSV antibodies pending   Ct Head Wo Contrast  08/26/2013 IMPRESSION: There is no evidence of an acute intracranial hemorrhage nor of an evolving ischemic event. There is no intracranial mass effect or hydrocephalus.   Dg  Chest Port 1 View  08/26/2013 IMPRESSION: No acute disease.   Tawanna Sat, MD 08/30/2013, 8:08 AM PGY-1, [place] Intern pager: [phone], text pages welcome

## 2013-08-30 NOTE — Progress Notes (Signed)
FMTS Attending Note  I personally saw and evaluated the patient. The plan of care was discussed with the resident team. I agree with the assessment and plan as documented by the resident.   MRI showed no evidence of acute osteomyelitis. Patient require 7 additional days of IV antibiotics for treatment of suspected meningitis, PICC line has been placed and patient will be set up with home health  Patient has past medical history of atrial fibrillation and is status post cardioversion, currently rate controlled with diltiazem, patient is at risk of developing atrial thrombus and is a candidate for anticoagulation given a CHADS score of 2, patient will be started on Xarelto and will be scheduled for followup with cardiology  Patient is stable for discharge  Dossie Arbour M.D.

## 2013-08-30 NOTE — Progress Notes (Signed)
Pt discharged per orders. All d/c instructions/medications/follow up care/follow up appts/PICC care discussed with time allowed for questions and concerns. Home Health Nurse met with patient prior to d/c. Pt's family to transport home. Pt refused wheelchair and walked off the unit with his family members. Roselyn Reef Layana Konkel,RN

## 2013-08-30 NOTE — Discharge Summary (Signed)
[facility] Discharge Summary  Patient name: Ray Hall Medical record number: [id number] Date of birth: 1960-12-30 Age: 53 y.o. Gender: male Date of Admission: 08/26/2013  Date of Discharge: 08/30/2013 Admitting Physician: Willeen Niece, MD  Primary Care Provider: Linna Darner, MD Consultants: ID (phone only)  Indication for Hospitalization: Acute encephalopathy, suspected meningitis  Discharge Diagnoses/Problem List:  Acute encephalopathy for HSV-2 meningitis Sepsis Hypertension Hx of paroxysmal afib Type 2 DM Chronic low back pain Substance abuse (opiate)  Disposition: home with PICC and IV antibiotics  Discharge Condition: stable, improved  Brief Hospital Course:  Ray Hall is a 53 y.o. male was admitted for acute encephalopathy and meeting sepsis criteria. PMH significant for HTN, T2DM, hx of paroxysmal afib s/p ablation, chronic low back pain. On admission patient was with AMS and fever 101.69F, tachycardia. Suspected meningitis or possible opioid use as patient's status improved with narcan and UDS positive for opiates. Labs significant for hyponatremia 9133), glucose 432, WBC 19.7 (90% neutrophils), lactic acid 4.04, UA no evidence of infection. CXR and CT head showed no acute abnormality. Blood cultures and LP were obtained with RBCs 10127 and WBC 101 (insufficient sample amount for glucose and protein). Patient was started on acyclovir and ceftriaxone. Flagyl was additionally given for 1.5 days for concern of lower extremity cellulitis anaerobic coverage. Patients mental status improved over the next several days. ID was consulted and discussed management of continued IV antibiotics and acyclovir as outpatient for 7-10 days even if antibodies and cultures came back negative (result of this should not guide treatment duration). PICC line was placed. There was concern for possible osteomyelitis of his left foot between 4th and 5th digits, MRI  was obtained which was negative. 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  Issues for Follow Up:  1. HSV meningitis: f/u with complete course of acyclovir (scheduled for 10 total days per ID), suggest obtaining HIV as outpatient. 2. Afib and anticoagulation: discussion with medical team and recommended to patient to start anticoagulation (can do Xarelto), patient wanted to hold off on starting until he discusses with PCP 3. Cardiology follow-up: for above issue, should be established with outpatient cardiologist in [facility]  Significant Procedures: LP  Significant Labs and Imaging:   Recent Labs Lab 08/26/13 2030 08/27/13 0525 08/28/13 0315  WBC 19.7* 17.6* 9.0  HGB 12.6* 11.5* 10.5*  HCT 35.2* 32.6* 30.1*  PLT 247 218 189    Recent Labs Lab 08/26/13 2030 08/27/13 0525 08/28/13 0315  NA 133* 140 134*  K 4.4 3.8 3.8  CL 94* 103 100  CO2 21 22 20   GLUCOSE 432* 195* 235*  BUN 16 19 14   CREATININE 0.97 1.13 0.90  CALCIUM 9.3 8.5 7.9*  ALKPHOS 72  --   --   AST 18  --   --   ALT 19  --   --   ALBUMIN 3.7  --   --    UDS positive for opiates RPR - nonreactive  Ct Head Wo Contrast  08/26/2013   CLINICAL DATA:  Altered mental status and fever  EXAM: CT HEAD WITHOUT CONTRAST  TECHNIQUE: Contiguous axial images were obtained from the base of the skull through the vertex without intravenous contrast.  COMPARISON:  CT HEAD W/O CM dated 10/15/2011  FINDINGS: The ventricles are normal in size and position. There is a cavum septum pellucidum which is a normal variant. There is no evidence of intracranial hemorrhage  nor of an evolving ischemic infarction. There are no abnormal intracranial calcifications. The cerebellum and brainstem are normal in density.  At bone window settings the observed portions of the paranasal sinuses and mastoid air cells are clear. There is no evidence of an acute skull fracture.  IMPRESSION: There is no evidence of an  acute intracranial hemorrhage nor of an evolving ischemic event. There is no intracranial mass effect or hydrocephalus.   Electronically Signed   By: David  Martinique   On: 08/26/2013 22:16   Mr Toes Left Wo/w Cm  08/30/2013   CLINICAL DATA:  Small left foot.  Fever.  EXAM: MRI OF THE LEFT TOES WITHOUT AND WITH CONTRAST  TECHNIQUE: Multiplanar, multisequence MR imaging was performed both before and after administration of intravenous contrast.  CONTRAST:  65mL MULTIHANCE GADOBENATE DIMEGLUMINE 529 MG/ML IV SOLN  COMPARISON:  None.  FINDINGS: There is no osteomyelitis or septic arthritis of forefoot. There are focal degenerative changes of head of the fourth metatarsal. Prominent dorsal subcutaneous edema of the forefoot. There are no definable abscesses. The muscles and tendons and ligaments all appear normal. No bone destruction or fractures.  IMPRESSION: No evidence of osteomyelitis or abscess. Nonspecific subcutaneous edema of the dorsum of the foot.   Electronically Signed   By: Rozetta Nunnery M.D.   On: 08/30/2013 08:30   Dg Chest Port 1 View  08/26/2013   CLINICAL DATA:  Chest pain.  EXAM: PORTABLE CHEST - 1 VIEW  COMPARISON:  Chest x-ray dated 01/14/2012  FINDINGS: The patient has taken a shallow inspiration which accentuates the pulmonary vascularity and interstitial markings. Heart size is normal considering the AP portable technique and shallow inspiration. No infiltrates or effusions. No osseous abnormality.  IMPRESSION: No acute disease.   Electronically Signed   By: Rozetta Nunnery M.D.   On: 08/26/2013 20:39   Dg Lumbar Puncture Fluoro Guide  08/27/2013   CLINICAL DATA:  Suspected meningitis, mental status change  EXAM: DIAGNOSTIC LUMBAR PUNCTURE UNDER FLUOROSCOPIC GUIDANCE  FLUOROSCOPY TIME:  3.24 min.  PROCEDURE: Informed consent was obtained from the patient's wife prior to the procedure, including potential complications of headache, allergy, and pain. With the patient prone, the lower back was  prepped with Betadine. 1% Lidocaine was used for local anesthesia. Lumbar puncture was performed at the L3-4 level using a 22 gauge needle with return of slightly blood tinged CSF. 2 ccml of CSF were obtained with difficulty for laboratory studies. The patient tolerated the procedure reasonably well and there were no apparent complications.  IMPRESSION: al  The findings were discussed with Dr. Zenia Resides. No specific postprocedure orders were given other than having the patient lie supine as much as possible given the patient is not are ready in the [place] emergency department and anticipated transfer to the [place] is planned.   Electronically Signed   By: David  Martinique   On: 08/27/2013 00:26    Results/Tests Pending at Time of Discharge: Blood culture NGTD x 4 days  Discharge Medications:    Medication List         acyclovir 960 mg in dextrose 5 % 150 mL  Inject 960 mg into the vein every 8 (eight) hours. Stop after 09/05/13     BC HEADACHE POWDER PO  Take 1 packet by mouth every 8 (eight) hours as needed (for pain).     cefTRIAXone 2 g in dextrose 5 % 50 mL  Inject 2 g into the vein every 12 (twelve) hours.  Stop after 09/05/13     cyclobenzaprine 10 MG tablet  Commonly known as:  FLEXERIL  Take 1 tablet (10 mg total) by mouth 3 (three) times daily as needed. For muscle spasms.     diltiazem 60 MG tablet  Commonly known as:  CARDIZEM  Take 1 tablet (60 mg total) by mouth 4 (four) times daily.     furosemide 40 MG tablet  Commonly known as:  LASIX  Take 1 tablet (40 mg total) by mouth 3 (three) times a week.     gabapentin 100 MG capsule  Commonly known as:  NEURONTIN  Take 1 capsule (100 mg total) by mouth 3 (three) times daily.     glipiZIDE 10 MG tablet  Commonly known as:  GLUCOTROL  Take 1 tablet (10 mg total) by mouth 2 (two) times daily before a meal.     HYDROcodone-acetaminophen 10-325 MG per tablet  Commonly known as:  NORCO  Take 1-2 tablets by mouth at bedtime as  needed.     lisinopril 20 MG tablet  Commonly known as:  PRINIVIL,ZESTRIL  Take 1 tablet (20 mg total) by mouth daily.     metFORMIN 1000 MG tablet  Commonly known as:  GLUCOPHAGE  Take 1 tablet (1,000 mg total) by mouth 2 (two) times daily with a meal.     multivitamin with minerals Tabs tablet  Take 1 tablet by mouth daily.     mupirocin ointment 2 %  Commonly known as:  BACTROBAN  Place 1 application into the nose 2 (two) times daily.     naproxen sodium 220 MG tablet  Commonly known as:  ANAPROX  Take 440 mg by mouth 2 (two) times daily as needed (pain).     traMADol 50 MG tablet  Commonly known as:  ULTRAM  Take 1-2 tablets (50-100 mg total) by mouth every 8 (eight) hours as needed.        Discharge Instructions: Please refer to Patient Instructions section of EMR for full details.  Patient was counseled important signs and symptoms that should prompt return to medical care, changes in medications, dietary instructions, activity restrictions, and follow up appointments.   Follow-Up Appointments: Follow-up Information   Follow up with Linna Darner, MD. (Thursday 5/7 @ 9:45 AM )    Specialty:  Family Medicine   Contact information:   [place] [address] [phone]       Follow up with [facility]. (Registered Nurse for IV antibiotics therapy.)    Contact information:   [place] [address] [phone]       Tawanna Sat, MD 08/30/2013, 6:40 PM PGY-1, [place]

## 2013-09-01 NOTE — Discharge Summary (Signed)
I agree with the discharge summary as documented.   Velmer Broadfoot MD  

## 2013-09-02 ENCOUNTER — Encounter: Payer: Self-pay | Admitting: Family Medicine

## 2013-09-02 ENCOUNTER — Ambulatory Visit (INDEPENDENT_AMBULATORY_CARE_PROVIDER_SITE_OTHER): Payer: BC Managed Care – PPO | Admitting: Family Medicine

## 2013-09-02 ENCOUNTER — Ambulatory Visit (HOSPITAL_COMMUNITY)
Admission: RE | Admit: 2013-09-02 | Discharge: 2013-09-02 | Disposition: A | Discharge status: Home / Self Care | Payer: BC Managed Care – PPO | Source: Ambulatory Visit | Attending: Family Medicine | Admitting: Family Medicine

## 2013-09-02 VITALS — BP 160/95 | HR 91 | Temp 98.5°F | Ht 79.0 in | Wt 343.0 lb

## 2013-09-02 DIAGNOSIS — G9340 Encephalopathy, unspecified: Secondary | ICD-10-CM

## 2013-09-02 DIAGNOSIS — E119 Type 2 diabetes mellitus without complications: Secondary | ICD-10-CM | POA: Insufficient documentation

## 2013-09-02 DIAGNOSIS — I4891 Unspecified atrial fibrillation: Secondary | ICD-10-CM | POA: Insufficient documentation

## 2013-09-02 DIAGNOSIS — I480 Paroxysmal atrial fibrillation: Secondary | ICD-10-CM

## 2013-09-02 LAB — CULTURE, BLOOD (ROUTINE X 2)
Culture: NO GROWTH
Culture: NO GROWTH

## 2013-09-02 LAB — GLUCOSE, CAPILLARY: Glucose-Capillary: 113 mg/dL — ABNORMAL HIGH (ref 70–99)

## 2013-09-02 MED ORDER — RIVAROXABAN 15 MG PO TABS: 15.0000 mg | ORAL_TABLET | Freq: Two times a day (BID) | ORAL | Status: DC

## 2013-09-02 MED ORDER — RIVAROXABAN 20 MG PO TABS: 20.0000 mg | ORAL_TABLET | Freq: Every day | ORAL | Status: DC

## 2013-09-02 NOTE — Assessment & Plan Note (Addendum)
Pt noted to have Afib once in the past and during admission. Current annual risk of stroke is 5.4% based on CHADS2 and 2.9% on CHADS2-VASc scores. Anticoagulation would reduce the risk to 1-1.8%.  Pt needs cards evaluation Not in Afib today (EKG NSR) but interested in starting anticoagulation Will start Xarelto 15 BID x

## 2013-09-02 NOTE — Patient Instructions (Addendum)
You are recovering from a serious brain infection called meningitis. You appear to be doing very well Please continue your medications until completed on SUnday.  Please come back to see me or another provider next week to confirm you are safe for work Please follow up with the cardiologist when they call you for an appointment.  Please start the blood thinner Xarelto to prevent stroke

## 2013-09-02 NOTE — Assessment & Plan Note (Signed)
Pt appears to be recoveing well. Still w/ PICC line in place.  Finishes Acyclovir therapy on Sunday.  Pt to f/u w/ me after finishing therapy for clearance for work Wife very concerned and somewhat upset today at Dx that this may be from HSV2. HIV, HSV1&2 antibody tests today.

## 2013-09-02 NOTE — Progress Notes (Signed)
Ray Hall is a 53 y.o. 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   Pt still feeling tired but slowly improving. Pt has not been back to work since incident. Continues to ttake Acyclovir. Mentation is clear. Pt adamently denies any h/o genital herpes or cold sores. Denies other sexual contacts in past 16 years. Wife denies h/o herpes  A.Fib: no fuerther palpitations or syncope, CP, or SOB. Pt not taking any blood thinners.   The following portions of the patient's history were reviewed and updated as appropriate: allergies, current medications, past medical history, family and social history, and problem list.  Patient is a nonsmoker  Past Medical History  Diagnosis Date  . Sickle cell trait   . Normal nuclear stress test 07/2010    low prob of ischemia, EF 42%  . Echocardiogram abnormal     moderate LVH, mild LV hypokenesis, EF 42%  . History of Doppler ultrasound 07/2010    negative for DVT  . Abnormal CT of the abdomen     mild L hydronephrosis and hydroureter  . Status post radiofrequency ablation for arrhythmia 10/16/10    [facility] Rucks MD)  . Diabetes mellitus   . Hypertension     ROS as above otherwise neg.    Medications reviewed. Current Outpatient Prescriptions  Medication Sig Dispense Refill  . acyclovir 960 mg in dextrose 5 % 150 mL Inject 960 mg into the vein every 8 (eight) hours. Stop after 09/05/13  18.24 g  0  . Aspirin-Salicylamide-Caffeine (BC HEADACHE POWDER PO) Take 1 packet by mouth every 8 (eight) hours as needed (for pain).      . cefTRIAXone 2 g in dextrose 5 % 50 mL Inject 2 g into the vein every 12 (twelve) hours. Stop after 09/05/13  26 g  0  . cyclobenzaprine (FLEXERIL) 10 MG tablet Take 1 tablet (10 mg total) by mouth 3 (three) times daily as needed. For muscle spasms.  90 tablet  6  . diltiazem (CARDIZEM) 60 MG  tablet Take 1 tablet (60 mg total) by mouth 4 (four) times daily.  180 tablet  4  . furosemide (LASIX) 40 MG tablet Take 1 tablet (40 mg total) by mouth 3 (three) times a week.  30 tablet  3  . gabapentin (NEURONTIN) 100 MG capsule Take 1 capsule (100 mg total) by mouth 3 (three) times daily.  90 capsule  3  . glipiZIDE (GLUCOTROL) 10 MG tablet Take 1 tablet (10 mg total) by mouth 2 (two) times daily before a meal.  60 tablet  3  . HYDROcodone-acetaminophen (NORCO) 10-325 MG per tablet Take 1-2 tablets by mouth at bedtime as needed.  60 tablet  0  . lisinopril (PRINIVIL,ZESTRIL) 20 MG tablet Take 1 tablet (20 mg total) by mouth daily.  90 tablet  3  . metFORMIN (GLUCOPHAGE) 1000 MG tablet Take 1 tablet (1,000 mg total) by mouth 2 (two) times daily with a meal.  180 tablet  3  . Multiple Vitamin (MULTIVITAMIN WITH MINERALS) TABS Take 1 tablet by mouth daily.      . naproxen sodium (ANAPROX) 220 MG tablet Take 440 mg by mouth 2 (two) times daily as needed (pain).       . Rivaroxaban (XARELTO) 15 MG TABS tablet Take 1 tablet (15 mg total) by mouth 2 (two) times daily with a meal.  42 tablet  0  . rivaroxaban (XARELTO) 20 MG TABS tablet Take 1 tablet (20 mg total) by mouth daily with supper. Start in 22 days  30 tablet  6  . traMADol (ULTRAM) 50 MG tablet Take 1-2 tablets (50-100 mg total) by mouth every 8 (eight) hours as needed.  60 tablet  3   No current facility-administered medications for this visit.    Exam: BP 160/95  Pulse 91  Temp(Src) 98.5 F (36.9 C) (Oral)  Ht 6\' 7"  (2.007 m)  Wt 343 lb (155.584 kg)  BMI 38.63 kg/m2 Gen: Well NAD HEENT: EOMI,  MMM Lungs: CTABL Nl WOB Heart: RRR no MRG   Results for orders placed in visit on 09/02/13 (from the past 72 hour(s))  GLUCOSE, CAPILLARY     Status: Abnormal   Collection Time    09/02/13 10:07 AM      Result Value Ref Range   Glucose-Capillary 113 (*) 70 - 99 mg/dL    A/P (as seen in Problem list)  Paroxysmal atrial  fibrillation Pt noted to have Afib once in the past and during admission. Current annual risk of stroke is 5.4% based on CHADS2 and 2.9% on CHADS2-VASc scores. Anticoagulation would reduce the risk to 1-1.8%.  Pt needs cards evaluation Not in Afib today (EKG NSR) but interested in starting anticoagulation Will start Xarelto 15 BID x     Acute encephalopathy Pt appears to be recoveing well. Still w/ PICC line in place.  Finishes Acyclovir therapy on Sunday.  Pt to f/u w/ me after finishing therapy for clearance for work Wife very concerned and somewhat upset today at Dx that this may be from HSV2. HIV, HSV1&2 antibody tests today.    Spent >25 min in direct pt care and coordination Fully reviewed hosptial labs and notes w./ pt and family

## 2013-09-03 LAB — HSV(HERPES SMPLX)ABS-I+II(IGG+IGM)-BLD
HSV 2 GLYCOPROTEIN G AB, IGG: 10.58 IV — AB
Herpes Simplex Vrs I&II-IgM Ab (EIA): 1.59 INDEX — ABNORMAL HIGH

## 2013-09-03 LAB — HIV ANTIBODY (ROUTINE TESTING W REFLEX): HIV: NONREACTIVE

## 2013-09-06 ENCOUNTER — Telehealth: Payer: Self-pay | Admitting: Family Medicine

## 2013-09-06 NOTE — Telephone Encounter (Signed)
LVM to pt   Appt 09/09/2013 [place] [phone]   [place]

## 2013-09-07 ENCOUNTER — Telehealth: Payer: Self-pay | Admitting: Family Medicine

## 2013-09-07 NOTE — Telephone Encounter (Signed)
  Pt called   Pt still w/ PICC line but completed therapy and feeling much better.   Nurse with [facility] needs order to DC Picc line Nurse's name and contact infromation: Kim [phone]  Pt informed of + HSV IgMand IgG antibody results. Denies any genital or oral lesions or intercourse or oral sex w/ anyone besides wife.   Pt to come in on 5/14 for f/u appt.  Anticipate pt will be stable and ready to work again on Thursday.  Linna Darner, MD Family Medicine PGY-3 09/07/2013, 10:22 AM

## 2013-09-07 NOTE — Telephone Encounter (Signed)
LM for Kim to call back. Jazmin Hartsell,CMA

## 2013-09-07 NOTE — Telephone Encounter (Signed)
Spoke with Maudie Mercury and she states that you need to send on a rx that you want this DC.  It can be faxed to [facility] at [phone].  Thanks [employer]

## 2013-09-09 ENCOUNTER — Ambulatory Visit (INDEPENDENT_AMBULATORY_CARE_PROVIDER_SITE_OTHER): Payer: BC Managed Care – PPO | Admitting: Family Medicine

## 2013-09-09 ENCOUNTER — Ambulatory Visit: Payer: Self-pay | Admitting: Cardiology

## 2013-09-09 ENCOUNTER — Encounter: Payer: Self-pay | Admitting: Family Medicine

## 2013-09-09 VITALS — BP 166/81 | HR 106 | Temp 98.3°F | Ht 79.0 in | Wt 331.0 lb

## 2013-09-09 DIAGNOSIS — Z8661 Personal history of infections of the central nervous system: Secondary | ICD-10-CM | POA: Insufficient documentation

## 2013-09-09 NOTE — Progress Notes (Signed)
Patient ID: Ray Hall, male   DOB: 1960/08/25, 53 y.o.   MRN: [id number]    Subjective: HPI: Patient is a 53 y.o. male presenting to clinic today for follow up appointment. Concerns today include fatigue  Meningitis- Presumed secondary to HSV, was on IV acyclovir via PICC line until 5/10 (10 days total). Starting a few days ago he has had increased fatigue. No fevers, does report feeling cold/chills. One day of headache and some stiff neck (not new). States he feels ready to go back to work.   History Reviewed: Daily smoker.  ROS: Please see HPI above.  Objective: Office vital signs reviewed. BP 166/81  Pulse 106  Temp(Src) 98.3 F (36.8 C) (Oral)  Ht 6\' 7"  (2.007 m)  Wt 331 lb (150.141 kg)  BMI 37.27 kg/m2  Physical Examination:  General: Awake, alert. NAD, appears uncomfortable HEENT: Atraumatic, normocephalic. MMM.  Neck: No masses palpated. No LAD. Good active ROM.  Pulm: CTAB, no wheezes Cardio: RRR, no murmurs appreciated Abdomen: Obese, +BS, soft, nontender, nondistended Extremities: No edema. PICC in place RUE, appears clean Neuro: CN wnl, strength and sensation grossly intact  PICC line Removal Proper placement of PICC confirmed by CXR at time of placement. Consent signed. PICC line located in [place] . PICC line pulled without complication. Measured 23" from tip. Tip not frayyed Procedure without incident. Patient tolerated procedure well. Waited 30 minutes in clinic in recumbent position without syncope or lightheadedness.  Assessment: 53 y.o. male with HSV meningitis  Plan: See Problem List and After Visit Summary

## 2013-09-09 NOTE — Patient Instructions (Signed)
We have pulled your picc today. Keep the area clean and dry. Since you do not feel 100% we will see you back no Monday to release you to work.  If you have fevers, increased headache, worsening fatigue or overall any concerns please seek medical care!

## 2013-09-09 NOTE — Assessment & Plan Note (Signed)
A: H/o recent meningitis, assumed to be HSV. Now with fatigue and chills after stopping Acyclovir. No fevers. Patients with viral meningitis can have prolonged symptoms even in setting of adequate treatment. I have precepted this case with patient's PCP, Dr. Marily Memos, as well as our supervising physicians, Drs. Mingo Kellan Raffield and Seeley. Patient is currently without headache and hemodynamically stable.  P: - PICC line pulled today and documented in flowsheet. - Continue to monitor symptoms through the weekend. - Given strict precautions to seek care - F/u on Monday May 18 for recheck and release to go back to work, if symptoms improved.

## 2013-09-10 ENCOUNTER — Encounter: Payer: Self-pay | Admitting: Interventional Cardiology

## 2013-09-10 NOTE — Telephone Encounter (Signed)
Thank you for seeing Mr. Buer Will see him on Monday.

## 2013-09-13 ENCOUNTER — Encounter: Payer: Self-pay | Admitting: Family Medicine

## 2013-09-13 ENCOUNTER — Ambulatory Visit (INDEPENDENT_AMBULATORY_CARE_PROVIDER_SITE_OTHER): Payer: BC Managed Care – PPO | Admitting: Family Medicine

## 2013-09-13 VITALS — BP 165/105 | HR 106 | Temp 98.5°F | Wt 332.0 lb

## 2013-09-13 DIAGNOSIS — Z8661 Personal history of infections of the central nervous system: Secondary | ICD-10-CM

## 2013-09-13 DIAGNOSIS — I10 Essential (primary) hypertension: Secondary | ICD-10-CM

## 2013-09-13 MED ORDER — HYDROCODONE-ACETAMINOPHEN 10-325 MG PO TABS: 1.0000 | ORAL_TABLET | Freq: Every evening | ORAL | Status: DC | PRN

## 2013-09-13 MED ORDER — CYCLOBENZAPRINE HCL 10 MG PO TABS: 10.0000 mg | ORAL_TABLET | Freq: Three times a day (TID) | ORAL | Status: DC | PRN

## 2013-09-13 NOTE — Assessment & Plan Note (Signed)
Improving Encouraged pt to get out and be as active as possible F/u in 1 wk for clearance to go back to work (pt does not feel strong enough to go back to work just yet) No need for restarting antiviral meds at this time

## 2013-09-13 NOTE — Assessment & Plan Note (Signed)
Elevated again today Took meds just prior to appt May need to increase if continues to be elevated

## 2013-09-13 NOTE — Patient Instructions (Signed)
You are doing better overall You are slowly improving Please come back to see me in 1 week for evaluation You dio not have any current signs of infection and need to stay active in order to recover as quickly as possible.

## 2013-09-13 NOTE — Progress Notes (Signed)
Ray Hall is a 53 y.o. male who presents to [facility] today for meningitis f/u  Meningitis: improving but still tired. Mild neck stiffness. HA resolved. Still w/ low energy and feels tired often  HTN: No CP, SOB, or HA. Took meds just prior to coming.   R arm pain: at picc line insertion site. Radiation down R arm. No change   The following portions of the patient's history were reviewed and updated as appropriate: allergies, current medications, past medical history, family and social history, and problem list.  Patient is a smoker  Past Medical History  Diagnosis Date  . Sickle cell trait   . Normal nuclear stress test 07/2010    low prob of ischemia, EF 42%  . Echocardiogram abnormal     moderate LVH, mild LV hypokenesis, EF 42%  . History of Doppler ultrasound 07/2010    negative for DVT  . Abnormal CT of the abdomen     mild L hydronephrosis and hydroureter  . Status post radiofrequency ablation for arrhythmia 10/16/10    [facility] Rucks MD)  . Diabetes mellitus   . Hypertension     ROS as above otherwise neg.    Medications reviewed. Current Outpatient Prescriptions  Medication Sig Dispense Refill  . Aspirin-Salicylamide-Caffeine (BC HEADACHE POWDER PO) Take 1 packet by mouth every 8 (eight) hours as needed (for pain).      . cyclobenzaprine (FLEXERIL) 10 MG tablet Take 1 tablet (10 mg total) by mouth 3 (three) times daily as needed. For muscle spasms.  90 tablet  6  . diltiazem (CARDIZEM) 60 MG tablet Take 1 tablet (60 mg total) by mouth 4 (four) times daily.  180 tablet  4  . furosemide (LASIX) 40 MG tablet Take 1 tablet (40 mg total) by mouth 3 (three) times a week.  30 tablet  3  . gabapentin (NEURONTIN) 100 MG capsule Take 1 capsule (100 mg total) by mouth 3 (three) times daily.  90 capsule  3  . glipiZIDE (GLUCOTROL) 10 MG tablet Take 1 tablet (10 mg total) by mouth 2 (two) times daily before a meal.  60 tablet  3  . HYDROcodone-acetaminophen (NORCO) 10-325 MG per  tablet Take 1-2 tablets by mouth at bedtime as needed.  60 tablet  0  . lisinopril (PRINIVIL,ZESTRIL) 20 MG tablet Take 1 tablet (20 mg total) by mouth daily.  90 tablet  3  . metFORMIN (GLUCOPHAGE) 1000 MG tablet Take 1 tablet (1,000 mg total) by mouth 2 (two) times daily with a meal.  180 tablet  3  . Multiple Vitamin (MULTIVITAMIN WITH MINERALS) TABS Take 1 tablet by mouth daily.      . Rivaroxaban (XARELTO) 15 MG TABS tablet Take 1 tablet (15 mg total) by mouth 2 (two) times daily with a meal.  42 tablet  0  . rivaroxaban (XARELTO) 20 MG TABS tablet Take 1 tablet (20 mg total) by mouth daily with supper. Start in 22 days  30 tablet  6  . traMADol (ULTRAM) 50 MG tablet Take 1-2 tablets (50-100 mg total) by mouth every 8 (eight) hours as needed.  60 tablet  3   No current facility-administered medications for this visit.    Exam:  BP 165/105  Pulse 106  Temp(Src) 98.5 F (36.9 C) (Oral)  Wt 332 lb (150.594 kg) Gen: Well NAD HEENT: EOMI,  MMM MSK/skin: no sign of induration, erythema, discharge at picc line insertion site. Grip strength and ROM again resistance equal in UE.  No results found for this or any previous visit (from the past 72 hour(s)).  A/P (as seen in Problem list)  HYPERTENSION Elevated again today Took meds just prior to appt May need to increase if continues to be elevated   History of meningitis Improving Encouraged pt to get out and be as active as possible F/u in 1 wk for clearance to go back to work (pt does not feel strong enough to go back to work just yet) No need for restarting antiviral meds at this time      R arm pain: no clear etiology. May be related to nerve irritation from picc line, but unlikely. May be related to meningitis but unlikely as such a focal complaint. waitful watching at this time. May inc gabapentin in future

## 2013-09-23 ENCOUNTER — Encounter: Payer: Self-pay | Admitting: Family Medicine

## 2013-09-23 ENCOUNTER — Ambulatory Visit (INDEPENDENT_AMBULATORY_CARE_PROVIDER_SITE_OTHER): Payer: BC Managed Care – PPO | Admitting: Family Medicine

## 2013-09-23 VITALS — BP 140/70 | HR 102 | Temp 98.0°F | Ht 79.0 in | Wt 325.0 lb

## 2013-09-23 DIAGNOSIS — I4891 Unspecified atrial fibrillation: Secondary | ICD-10-CM

## 2013-09-23 DIAGNOSIS — Z8661 Personal history of infections of the central nervous system: Secondary | ICD-10-CM

## 2013-09-23 DIAGNOSIS — M7918 Myalgia, other site: Secondary | ICD-10-CM

## 2013-09-23 DIAGNOSIS — I480 Paroxysmal atrial fibrillation: Secondary | ICD-10-CM

## 2013-09-23 DIAGNOSIS — IMO0001 Reserved for inherently not codable concepts without codable children: Secondary | ICD-10-CM

## 2013-09-23 DIAGNOSIS — I10 Essential (primary) hypertension: Secondary | ICD-10-CM

## 2013-09-23 MED ORDER — RIVAROXABAN 20 MG PO TABS: 20.0000 mg | ORAL_TABLET | Freq: Every day | ORAL | Status: DC

## 2013-09-23 NOTE — Assessment & Plan Note (Signed)
At goal.  No change 

## 2013-09-23 NOTE — Assessment & Plan Note (Signed)
Pt to f/u w/ ortho for multiple joint pains, especially R shoulder for discussion of surgical interventions.

## 2013-09-23 NOTE — Patient Instructions (Signed)
Please call the ortho office and cardiology to make a follow up appointment. Please resume work Please come back to see me in mid July for your next diabetes and blood pressure check Please come in or call anytime with any other concerns

## 2013-09-23 NOTE — Assessment & Plan Note (Signed)
No signs of recurrance.  Still getting strength back OK to resume work

## 2013-09-23 NOTE — Progress Notes (Signed)
Ray Hall is a 53 y.o. male who presents to [facility] today for meningitis f/u  Meningitis: no HA. Walking about 54yr[address] daily for the express purpose of exercising but gets tired. Denies CP, SOB, palpitations, syncope, HA.   R shoulder pain: pain is now returning. Has not seen ortho yet  Afib: not taking xarelto. deneis palpitations, one sided weakness or other TIA/Stroke like symptoms.   The following portions of the patient's history were reviewed and updated as appropriate: allergies, current medications, past medical history, family and social history, and problem list.  Patient is a nonsmoker.  Past Medical History  Diagnosis Date  . Sickle cell trait   . Normal nuclear stress test 07/2010    low prob of ischemia, EF 42%  . Echocardiogram abnormal     moderate LVH, mild LV hypokenesis, EF 42%  . History of Doppler ultrasound 07/2010    negative for DVT  . Abnormal CT of the abdomen     mild L hydronephrosis and hydroureter  . Status post radiofrequency ablation for arrhythmia 10/16/10    [facility] Rucks MD)  . Diabetes mellitus   . Hypertension     ROS as above otherwise neg.    Medications reviewed. Current Outpatient Prescriptions  Medication Sig Dispense Refill  . Aspirin-Salicylamide-Caffeine (BC HEADACHE POWDER PO) Take 1 packet by mouth every 8 (eight) hours as needed (for pain).      . cyclobenzaprine (FLEXERIL) 10 MG tablet Take 1 tablet (10 mg total) by mouth 3 (three) times daily as needed. For muscle spasms.  90 tablet  6  . diltiazem (CARDIZEM) 60 MG tablet Take 1 tablet (60 mg total) by mouth 4 (four) times daily.  180 tablet  4  . furosemide (LASIX) 40 MG tablet Take 1 tablet (40 mg total) by mouth 3 (three) times a week.  30 tablet  3  . gabapentin (NEURONTIN) 100 MG capsule Take 1 capsule (100 mg total) by mouth 3 (three) times daily.  90 capsule  3  . glipiZIDE (GLUCOTROL) 10 MG tablet Take 1 tablet (10 mg total) by mouth 2 (two) times daily before a meal.   60 tablet  3  . HYDROcodone-acetaminophen (NORCO) 10-325 MG per tablet Take 1-2 tablets by mouth at bedtime as needed.  60 tablet  0  . lisinopril (PRINIVIL,ZESTRIL) 20 MG tablet Take 1 tablet (20 mg total) by mouth daily.  90 tablet  3  . metFORMIN (GLUCOPHAGE) 1000 MG tablet Take 1 tablet (1,000 mg total) by mouth 2 (two) times daily with a meal.  180 tablet  3  . Multiple Vitamin (MULTIVITAMIN WITH MINERALS) TABS Take 1 tablet by mouth daily.      . rivaroxaban (XARELTO) 20 MG TABS tablet Take 1 tablet (20 mg total) by mouth daily with supper. Start in 22 days  15 tablet  0  . traMADol (ULTRAM) 50 MG tablet Take 1-2 tablets (50-100 mg total) by mouth every 8 (eight) hours as needed.  60 tablet  3   No current facility-administered medications for this visit.    Exam:  BP 140/70  Pulse 102  Temp(Src) 98 F (36.7 C) (Oral)  Ht 6\' 7"  (2.007 m)  Wt 325 lb (147.419 kg)  BMI 36.60 kg/m2 Gen: Well NAD HEENT: EOMI,  MMM   No results found for this or any previous visit (from the past 68 hour(s)).  A/P (as seen in Problem list)  Paroxysmal atrial fibrillation Last EKG w/o sign of Afib.  Ablation  in 2012. liekly recent episode caused by infection Pt given Xarelto samples and coupon for coverage Pt to call cards for f/u.   HYPERTENSION At goal. No change  Musculoskeletal pain Pt to f/u w/ ortho for multiple joint pains, especially R shoulder for discussion of surgical interventions.   History of meningitis No signs of recurrance.  Still getting strength back OK to resume work

## 2013-09-23 NOTE — Assessment & Plan Note (Signed)
Last EKG w/o sign of Afib.  Ablation in 2012. liekly recent episode caused by infection Pt given Xarelto samples and coupon for coverage Pt to call cards for f/u.

## 2013-09-30 ENCOUNTER — Telehealth: Payer: Self-pay | Admitting: Family Medicine

## 2013-09-30 NOTE — Telephone Encounter (Signed)
Wife called to check the status of forms that were left with Dr. Marily Memos to fill out. Blima Rich

## 2013-10-01 NOTE — Telephone Encounter (Signed)
LMOVM informing that forms were up front for pickup. Ray Hall

## 2013-10-01 NOTE — Telephone Encounter (Signed)
Paperwork filled out and given to staff for pickup

## 2013-10-06 ENCOUNTER — Encounter: Payer: Self-pay | Admitting: Cardiology

## 2013-10-27 ENCOUNTER — Telehealth: Payer: Self-pay | Admitting: *Deleted

## 2013-10-27 NOTE — Telephone Encounter (Signed)
LM on wife's phone to call back and schedule an appt to follow up on his diabetes and blood pressure.  Please assist her in making him an appt for this.  He needs an A1C and LDL at this visit. Thanks [employer]

## 2013-11-12 ENCOUNTER — Ambulatory Visit: Payer: Self-pay | Admitting: Family Medicine

## 2013-11-19 ENCOUNTER — Ambulatory Visit (INDEPENDENT_AMBULATORY_CARE_PROVIDER_SITE_OTHER): Payer: BC Managed Care – PPO | Admitting: Family Medicine

## 2013-11-19 ENCOUNTER — Encounter: Payer: Self-pay | Admitting: Family Medicine

## 2013-11-19 VITALS — BP 144/84 | HR 82 | Temp 99.0°F | Wt 323.0 lb

## 2013-11-19 DIAGNOSIS — IMO0001 Reserved for inherently not codable concepts without codable children: Secondary | ICD-10-CM

## 2013-11-19 DIAGNOSIS — I10 Essential (primary) hypertension: Secondary | ICD-10-CM

## 2013-11-19 DIAGNOSIS — M7918 Myalgia, other site: Secondary | ICD-10-CM

## 2013-11-19 DIAGNOSIS — E119 Type 2 diabetes mellitus without complications: Secondary | ICD-10-CM

## 2013-11-19 LAB — POCT GLYCOSYLATED HEMOGLOBIN (HGB A1C): HEMOGLOBIN A1C: 6.8

## 2013-11-19 MED ORDER — LISINOPRIL 20 MG PO TABS: 20.0000 mg | ORAL_TABLET | Freq: Every day | ORAL | Status: DC

## 2013-11-19 MED ORDER — HYDROCODONE-ACETAMINOPHEN 10-325 MG PO TABS: 1.0000 | ORAL_TABLET | Freq: Four times a day (QID) | ORAL | Status: DC | PRN

## 2013-11-19 MED ORDER — MELOXICAM 15 MG PO TABS: 15.0000 mg | ORAL_TABLET | Freq: Every day | ORAL | Status: DC

## 2013-11-19 MED ORDER — GLUCOSE BLOOD VI STRP: ORAL_STRIP | Status: DC

## 2013-11-19 NOTE — Assessment & Plan Note (Signed)
A1c today 6.8, down from 8.2 in April. P: refills for glipizide, metformin, test strips (pt to test once daily).

## 2013-11-19 NOTE — Assessment & Plan Note (Signed)
Mildly above goal, likely secondary to increased pain and not taking lasix P: refill for lisinopril sent, continue current regimen. Encouraged to continue taking lasix despite incontinence, as he has mild edema in legs.
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
## 2013-11-23 ENCOUNTER — Telehealth: Payer: Self-pay | Admitting: *Deleted

## 2013-11-23 NOTE — Telephone Encounter (Signed)
Received fax from [place] [facility] needing to know what kind of test stirps and how often does pt test.  Please call [facility] [phone] or fax new Rx [phone]. Derl Barrow, RN

## 2013-11-24 ENCOUNTER — Other Ambulatory Visit: Payer: Self-pay | Admitting: Family Medicine

## 2013-11-24 MED ORDER — GLUCOSE BLOOD VI STRP: ORAL_STRIP | Status: DC

## 2013-11-26 ENCOUNTER — Telehealth (INDEPENDENT_AMBULATORY_CARE_PROVIDER_SITE_OTHER): Payer: Self-pay

## 2013-11-26 NOTE — Telephone Encounter (Signed)
LMOV pt has f/u appt with Dr. Grandville Silos 12/03/13.

## 2013-12-03 ENCOUNTER — Encounter (INDEPENDENT_AMBULATORY_CARE_PROVIDER_SITE_OTHER): Payer: Self-pay

## 2013-12-03 ENCOUNTER — Ambulatory Visit (INDEPENDENT_AMBULATORY_CARE_PROVIDER_SITE_OTHER): Payer: Worker's Compensation | Admitting: General Surgery

## 2013-12-03 DIAGNOSIS — S2232XA Fracture of one rib, left side, initial encounter for closed fracture: Secondary | ICD-10-CM | POA: Insufficient documentation

## 2013-12-03 DIAGNOSIS — S3722XA Contusion of bladder, initial encounter: Secondary | ICD-10-CM

## 2013-12-03 DIAGNOSIS — S2239XA Fracture of one rib, unspecified side, initial encounter for closed fracture: Secondary | ICD-10-CM

## 2013-12-03 DIAGNOSIS — S3730XA Unspecified injury of urethra, initial encounter: Secondary | ICD-10-CM

## 2013-12-03 DIAGNOSIS — S3720XA Unspecified injury of bladder, initial encounter: Secondary | ICD-10-CM

## 2013-12-03 MED ORDER — HYDROCODONE-ACETAMINOPHEN 10-325 MG PO TABS: 1.0000 | ORAL_TABLET | Freq: Four times a day (QID) | ORAL | Status: DC | PRN

## 2013-12-03 MED ORDER — MELOXICAM 15 MG PO TABS: 15.0000 mg | ORAL_TABLET | Freq: Every day | ORAL | Status: DC
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
## 2013-12-06 ENCOUNTER — Encounter (INDEPENDENT_AMBULATORY_CARE_PROVIDER_SITE_OTHER): Payer: Self-pay

## 2013-12-22 ENCOUNTER — Ambulatory Visit (INDEPENDENT_AMBULATORY_CARE_PROVIDER_SITE_OTHER): Payer: Worker's Compensation | Admitting: General Surgery

## 2013-12-22 ENCOUNTER — Encounter (INDEPENDENT_AMBULATORY_CARE_PROVIDER_SITE_OTHER): Payer: Self-pay | Admitting: General Surgery

## 2013-12-22 ENCOUNTER — Encounter (INDEPENDENT_AMBULATORY_CARE_PROVIDER_SITE_OTHER): Payer: Self-pay | Admitting: *Deleted

## 2013-12-22 VITALS — BP 160/88 | HR 105 | Temp 98.8°F | Ht >= 80 in | Wt 320.5 lb

## 2013-12-22 DIAGNOSIS — IMO0002 Reserved for concepts with insufficient information to code with codable children: Secondary | ICD-10-CM

## 2013-12-22 DIAGNOSIS — M25562 Pain in left knee: Secondary | ICD-10-CM

## 2013-12-22 DIAGNOSIS — S3722XA Contusion of bladder, initial encounter: Secondary | ICD-10-CM

## 2013-12-22 DIAGNOSIS — M25569 Pain in unspecified knee: Secondary | ICD-10-CM

## 2013-12-22 DIAGNOSIS — T148XXA Other injury of unspecified body region, initial encounter: Secondary | ICD-10-CM

## 2013-12-22 DIAGNOSIS — M7989 Other specified soft tissue disorders: Secondary | ICD-10-CM

## 2013-12-22 DIAGNOSIS — S3730XA Unspecified injury of urethra, initial encounter: Secondary | ICD-10-CM

## 2013-12-22 DIAGNOSIS — M79609 Pain in unspecified limb: Secondary | ICD-10-CM

## 2013-12-22 DIAGNOSIS — M79662 Pain in left lower leg: Secondary | ICD-10-CM

## 2013-12-22 DIAGNOSIS — S3720XA Unspecified injury of bladder, initial encounter: Secondary | ICD-10-CM

## 2013-12-22 DIAGNOSIS — S2239XA Fracture of one rib, unspecified side, initial encounter for closed fracture: Secondary | ICD-10-CM

## 2013-12-22 DIAGNOSIS — S2232XA Fracture of one rib, left side, initial encounter for closed fracture: Secondary | ICD-10-CM

## 2013-12-22 MED ORDER — NAPROXEN 500 MG PO TABS: 500.0000 mg | ORAL_TABLET | Freq: Two times a day (BID) | ORAL | Status: DC

## 2013-12-22 MED ORDER — OXYCODONE HCL 5 MG PO TABS: 5.0000 mg | ORAL_TABLET | Freq: Three times a day (TID) | ORAL | Status: DC | PRN
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
## 2013-12-23 ENCOUNTER — Other Ambulatory Visit (INDEPENDENT_AMBULATORY_CARE_PROVIDER_SITE_OTHER): Payer: Self-pay | Admitting: Rehabilitation

## 2013-12-23 ENCOUNTER — Other Ambulatory Visit (INDEPENDENT_AMBULATORY_CARE_PROVIDER_SITE_OTHER): Payer: Self-pay | Admitting: *Deleted

## 2014-01-15 ENCOUNTER — Emergency Department (HOSPITAL_COMMUNITY): Payer: BC Managed Care – PPO

## 2014-01-15 ENCOUNTER — Encounter (HOSPITAL_COMMUNITY): Payer: Self-pay | Admitting: Emergency Medicine

## 2014-01-15 ENCOUNTER — Emergency Department (HOSPITAL_COMMUNITY)
Admission: EM | Admit: 2014-01-15 | Discharge: 2014-01-15 | Disposition: A | Discharge status: Home / Self Care | Payer: BC Managed Care – PPO | Attending: Emergency Medicine | Admitting: Emergency Medicine

## 2014-01-15 DIAGNOSIS — I10 Essential (primary) hypertension: Secondary | ICD-10-CM | POA: Diagnosis not present

## 2014-01-15 DIAGNOSIS — F17200 Nicotine dependence, unspecified, uncomplicated: Secondary | ICD-10-CM | POA: Insufficient documentation

## 2014-01-15 DIAGNOSIS — M25519 Pain in unspecified shoulder: Secondary | ICD-10-CM | POA: Diagnosis present

## 2014-01-15 DIAGNOSIS — Z862 Personal history of diseases of the blood and blood-forming organs and certain disorders involving the immune mechanism: Secondary | ICD-10-CM | POA: Diagnosis not present

## 2014-01-15 DIAGNOSIS — Z79899 Other long term (current) drug therapy: Secondary | ICD-10-CM | POA: Insufficient documentation

## 2014-01-15 DIAGNOSIS — M25512 Pain in left shoulder: Secondary | ICD-10-CM

## 2014-01-15 DIAGNOSIS — E119 Type 2 diabetes mellitus without complications: Secondary | ICD-10-CM | POA: Insufficient documentation

## 2014-01-15 MED ORDER — OXYCODONE-ACETAMINOPHEN 5-325 MG PO TABS: 1.0000 | ORAL_TABLET | Freq: Four times a day (QID) | ORAL | Status: DC | PRN

## 2014-01-15 MED ORDER — OXYCODONE-ACETAMINOPHEN 5-325 MG PO TABS
1.0000 | ORAL_TABLET | Freq: Once | ORAL | Status: AC
  Administered 2014-01-15: 1 via ORAL
  Filled 2014-01-15: qty 1

## 2014-01-15 MED ORDER — DIAZEPAM 5 MG PO TABS: 5.0000 mg | ORAL_TABLET | Freq: Two times a day (BID) | ORAL | Status: DC

## 2014-01-15 MED ORDER — DIAZEPAM 5 MG/ML IJ SOLN
5.0000 mg | Freq: Once | INTRAMUSCULAR | Status: AC
  Administered 2014-01-15: 5 mg via INTRAMUSCULAR
  Filled 2014-01-15: qty 2

## 2014-01-15 NOTE — Discharge Instructions (Signed)

## 2014-01-15 NOTE — ED Provider Notes (Signed)
Medical screening examination/treatment/procedure(s) were performed by non-physician practitioner and as supervising physician I was immediately available for consultation/collaboration.    Dorie Rank, MD 01/15/14 [phone]
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
## 2014-01-18 ENCOUNTER — Emergency Department (HOSPITAL_COMMUNITY): Payer: BC Managed Care – PPO

## 2014-01-18 ENCOUNTER — Encounter (HOSPITAL_COMMUNITY): Payer: Self-pay | Admitting: Emergency Medicine

## 2014-01-18 ENCOUNTER — Emergency Department (HOSPITAL_COMMUNITY)
Admission: EM | Admit: 2014-01-18 | Discharge: 2014-01-18 | Disposition: A | Discharge status: Home / Self Care | Payer: BC Managed Care – PPO | Attending: Emergency Medicine | Admitting: Emergency Medicine

## 2014-01-18 DIAGNOSIS — F17200 Nicotine dependence, unspecified, uncomplicated: Secondary | ICD-10-CM | POA: Insufficient documentation

## 2014-01-18 DIAGNOSIS — Z79899 Other long term (current) drug therapy: Secondary | ICD-10-CM | POA: Insufficient documentation

## 2014-01-18 DIAGNOSIS — I10 Essential (primary) hypertension: Secondary | ICD-10-CM | POA: Insufficient documentation

## 2014-01-18 DIAGNOSIS — Z862 Personal history of diseases of the blood and blood-forming organs and certain disorders involving the immune mechanism: Secondary | ICD-10-CM | POA: Diagnosis not present

## 2014-01-18 DIAGNOSIS — E119 Type 2 diabetes mellitus without complications: Secondary | ICD-10-CM | POA: Insufficient documentation

## 2014-01-18 DIAGNOSIS — M25519 Pain in unspecified shoulder: Secondary | ICD-10-CM | POA: Insufficient documentation

## 2014-01-18 DIAGNOSIS — M25512 Pain in left shoulder: Secondary | ICD-10-CM

## 2014-01-18 LAB — BASIC METABOLIC PANEL
Anion gap: 18 — ABNORMAL HIGH (ref 5–15)
BUN: 11 mg/dL (ref 6–23)
CALCIUM: 9 mg/dL (ref 8.4–10.5)
CO2: 22 mEq/L (ref 19–32)
CREATININE: 0.72 mg/dL (ref 0.50–1.35)
Chloride: 95 mEq/L — ABNORMAL LOW (ref 96–112)
GFR calc Af Amer: >90 mL/min (ref >90)
GFR calc non Af Amer: >90 mL/min (ref >90)
GLUCOSE: 188 mg/dL — AB (ref 70–99)
Potassium: 3.8 mEq/L (ref 3.7–5.3)
Sodium: 135 mEq/L — ABNORMAL LOW (ref 137–147)

## 2014-01-18 LAB — I-STAT TROPONIN, ED: Troponin i, poc: 0 ng/mL (ref 0.00–0.08)

## 2014-01-18 LAB — CBC
HCT: 40.8 % (ref 39.0–52.0)
Hemoglobin: 14.4 g/dL (ref 13.0–17.0)
MCH: 28 pg (ref 26.0–34.0)
MCHC: 35.3 g/dL (ref 30.0–36.0)
MCV: 79.4 fL (ref 78.0–100.0)
Platelets: 251 10*3/uL (ref 150–400)
RBC: 5.14 MIL/uL (ref 4.22–5.81)
RDW: 13.6 % (ref 11.5–15.5)
WBC: 8.4 10*3/uL (ref 4.0–10.5)

## 2014-01-18 LAB — PRO B NATRIURETIC PEPTIDE: Pro B Natriuretic peptide (BNP): 24.4 pg/mL (ref 0–125)

## 2014-01-18 MED ORDER — PREDNISONE 10 MG PO TABS: 20.0000 mg | ORAL_TABLET | Freq: Two times a day (BID) | ORAL | Status: DC

## 2014-01-18 MED ORDER — HYDROMORPHONE HCL 1 MG/ML IJ SOLN
2.0000 mg | Freq: Once | INTRAMUSCULAR | Status: AC
  Administered 2014-01-18: 2 mg via INTRAMUSCULAR
  Filled 2014-01-18: qty 2

## 2014-01-18 MED ORDER — OXYCODONE-ACETAMINOPHEN 5-325 MG PO TABS: 1.0000 | ORAL_TABLET | ORAL | Status: DC | PRN

## 2014-01-18 NOTE — Discharge Instructions (Signed)
Prednisone as prescribed.  Percocet as prescribed as needed for pain.  Followup with orthopedics. The contact information for Dr. Alvan Dame has been provided on this discharge summary. Call to arrange this appointment.   Shoulder Pain The shoulder is the joint that connects your arms to your body. The bones that form the shoulder joint include the upper arm bone (humerus), the shoulder blade (scapula), and the collarbone (clavicle). The top of the humerus is shaped like a ball and fits into a rather flat socket on the scapula (glenoid cavity). A combination of muscles and strong, fibrous tissues that connect muscles to bones (tendons) support your shoulder joint and hold the ball in the socket. Small, fluid-filled sacs (bursae) are located in different areas of the joint. They act as cushions between the bones and the overlying soft tissues and help reduce friction between the gliding tendons and the bone as you move your arm. Your shoulder joint allows a wide range of motion in your arm. This range of motion allows you to do things like scratch your back or throw a ball. However, this range of motion also makes your shoulder more prone to pain from overuse and injury. Causes of shoulder pain can originate from both injury and overuse and usually can be grouped in the following four categories:  Redness, swelling, and pain (inflammation) of the tendon (tendinitis) or the bursae (bursitis).  Instability, such as a dislocation of the joint.  Inflammation of the joint (arthritis).  Broken bone (fracture). HOME CARE INSTRUCTIONS   Apply ice to the sore area.  Put ice in a plastic bag.  Place a towel between your skin and the bag.  Leave the ice on for 15-20 minutes, 3-4 times per day for the first 2 days, or as directed by your health care provider.  Stop using cold packs if they do not help with the pain.  If you have a shoulder sling or immobilizer, wear it as long as your caregiver instructs.  Only remove it to shower or bathe. Move your arm as little as possible, but keep your hand moving to prevent swelling.  Squeeze a soft ball or foam pad as much as possible to help prevent swelling.  Only take over-the-counter or prescription medicines for pain, discomfort, or fever as directed by your caregiver. SEEK MEDICAL CARE IF:   Your shoulder pain increases, or new pain develops in your arm, hand, or fingers.  Your hand or fingers become cold and numb.  Your pain is not relieved with medicines. SEEK IMMEDIATE MEDICAL CARE IF:   Your arm, hand, or fingers are numb or tingling.  Your arm, hand, or fingers are significantly swollen or turn white or blue. MAKE SURE YOU:   Understand these instructions.  Will watch your condition.  Will get help right away if you are not doing well or get worse. Document Released: 01/23/2005 Document Revised: 08/30/2013 Document Reviewed: 03/30/2011 [facility] Patient Information 2015 [place], [place]. This information is not intended to replace advice given to you by your health care provider. Make sure you discuss any questions you have with your health care provider.

## 2014-01-18 NOTE — ED Notes (Signed)
Pt comfortable with discharge and follow up instructions. Pt declines wheelchair, escorted to waiting area by this RN. Prescriptions x2. 

## 2014-01-18 NOTE — ED Provider Notes (Signed)
CSN: [id number]     Arrival date & time 01/18/14  1536 History   First MD Initiated Contact with Patient 01/18/14 1654     Chief Complaint  Patient presents with  . Chest Pain  . Shoulder Pain     (Consider location/radiation/quality/duration/timing/severity/associated sxs/prior Treatment) HPI Comments: Patient is a 53 year old male with history of diabetes. He presents today with a five-day history of left shoulder pain. He he woke up with severe pain in the left shoulder 5 days ago. He was seen at [facility] and was prescribed pain medication and muscle relaxer. He has had no relief. If anything, his pain is worse. His pain radiates into his chest and he occasionally feels short of breath.  Patient is a 53 y.o. male presenting with shoulder pain. The history is provided by the patient.  Shoulder Pain This is a new problem. Episode onset: 5 days ago. The problem occurs constantly. The problem has not changed since onset.Pertinent negatives include no chest pain. Exacerbated by: Movement and palpation. Nothing relieves the symptoms. He has tried nothing for the symptoms. The treatment provided no relief.    Past Medical History  Diagnosis Date  . Sickle cell trait   . Normal nuclear stress test 07/2010    low prob of ischemia, EF 42%  . Echocardiogram abnormal     moderate LVH, mild LV hypokenesis, EF 42%  . History of Doppler ultrasound 07/2010    negative for DVT  . Abnormal CT of the abdomen     mild L hydronephrosis and hydroureter  . Status post radiofrequency ablation for arrhythmia 10/16/10    [facility] Rucks MD)  . Diabetes mellitus   . Hypertension    Past Surgical History  Procedure Laterality Date  . Radiofrequency ablation  10/16/10    Cardiac for atrial arrythmia, [facility]  . Dental extractions  10/15/10    prior to ablation   Family History  Problem Relation Age of Onset  . Sickle cell trait    . Heart failure Father   . Diabetes Father   . Diabetes Mother   .  Hypertension Mother    History  Substance Use Topics  . Smoking status: Current Every Day Smoker -- 1.50 packs/day for 17 years    Types: Cigars  . Smokeless tobacco: Never Used     Comment: 2 cigars  . Alcohol Use: No    Review of Systems  Cardiovascular: Negative for chest pain.  All other systems reviewed and are negative.     Allergies  Review of patient's allergies indicates no known allergies.  Home Medications   Prior to Admission medications   Medication Sig Start Date End Date Taking? Authorizing Provider  cyclobenzaprine (FLEXERIL) 10 MG tablet Take 10 mg by mouth 3 (three) times daily as needed for muscle spasms.   Yes Historical Provider, MD  diazepam (VALIUM) 5 MG tablet Take 5 mg by mouth 2 (two) times daily.   Yes Historical Provider, MD  diltiazem (CARDIZEM) 60 MG tablet Take 60 mg by mouth 4 (four) times daily.   Yes Historical Provider, MD  oxyCODONE-acetaminophen (PERCOCET/ROXICET) 5-325 MG per tablet Take 1-2 tablets by mouth every 6 (six) hours as needed for moderate pain or severe pain. 01/15/14  Yes Courtney A Forcucci, PA-C  Aspirin-Caffeine (BC FAST PAIN RELIEF ARTHRITIS) 1000-65 MG PACK Take 1 tablet by mouth once.    Historical Provider, MD  furosemide (LASIX) 40 MG tablet Take 1 tablet (40 mg total) by mouth 3 (  three) times a week. 08/09/13 08/09/14  Waldemar Dickens, MD  gabapentin (NEURONTIN) 100 MG capsule Take 1 capsule (100 mg total) by mouth 3 (three) times daily. 08/09/13   Waldemar Dickens, MD  glipiZIDE (GLUCOTROL) 10 MG tablet Take 1 tablet (10 mg total) by mouth 2 (two) times daily before a meal. 08/09/13   Waldemar Dickens, MD  glucose blood test strip Check blood sugar once daily. 11/24/13   Leone Brand, MD  lisinopril (PRINIVIL,ZESTRIL) 20 MG tablet Take 1 tablet (20 mg total) by mouth daily. 11/19/13   Leone Brand, MD  metFORMIN (GLUCOPHAGE) 1000 MG tablet Take 1 tablet (1,000 mg total) by mouth 2 (two) times daily with a meal. 08/09/13   Waldemar Dickens, MD   BP 138/88  Pulse 100  Resp 16  SpO2 99% Physical Exam  Nursing note and vitals reviewed. Constitutional: He is oriented to person, place, and time. He appears well-developed and well-nourished. No distress.  HENT:  Head: Normocephalic and atraumatic.  Neck: Normal range of motion. Neck supple.  Cardiovascular: Normal rate, regular rhythm and normal heart sounds.   No murmur heard. Pulmonary/Chest: Effort normal and breath sounds normal. No respiratory distress.  Abdominal: Soft. Bowel sounds are normal. He exhibits no distension. There is no tenderness.  Musculoskeletal:  The left shoulder appears grossly normal. There is tenderness to palpation over the lateral and posterior aspect of the shoulder. There is pain with range of motion. Ulnar and radial pulses are easily palpable. He is able to flex and extend all fingers and sensation is intact to the entire hand.  Neurological: He is alert and oriented to person, place, and time.  Skin: Skin is warm and dry. He is not diaphoretic.    ED Course  Procedures (including critical care time) Labs Review Labs Reviewed  BASIC METABOLIC PANEL - Abnormal; Notable for the following:    Sodium 135 (*)    Chloride 95 (*)    Glucose, Bld 188 (*)    Anion gap 18 (*)    All other components within normal limits  CBC  PRO B NATRIURETIC PEPTIDE  I-STAT TROPOININ, ED    Imaging Review Dg Chest 2 View  01/18/2014   CLINICAL DATA:  Chest pain  EXAM: CHEST  2 VIEW  COMPARISON:  August 26, 2013  FINDINGS: There is no edema or consolidation. The heart size and pulmonary vascularity are normal. No adenopathy. No pneumothorax. No bone lesions.  IMPRESSION: No edema or consolidation.   Electronically Signed   By: Lowella Grip M.D.   On: 01/18/2014 16:58     EKG Interpretation   Date/Time:  Tuesday January 18 2014 15:42:02 EDT Ventricular Rate:  111 PR Interval:  162 QRS Duration: 96 QT Interval:  328 QTC Calculation:  446 R Axis:   59 Text Interpretation:  Sinus tachycardia Septal infarct , age undetermined  ST \\T \ T wave abnormality, consider inferior ischemia ST \\T \ T wave  abnormality, consider anterolateral ischemia Abnormal ECG Confirmed by  DELOS  MD, Arshawn ([id number]) on 01/18/2014 5:16:30 PM      MDM   Final diagnoses:  None    Patient presents with persistent shoulder pain for the past 5 days. He has had no relief with medications prescribed at [facility]. EKG and troponin are negative and I highly doubt a cardiac etiology. I do not feel as though further workup is indicated into this at this point as his pain appears very musculoskeletal in  nature. My advice to him is to treat this with prednisone and followup with orthopedics. I see no indication for emergent MRI or an emergent orthopedic consultation.    Veryl Speak, MD 01/18/14 [phone]

## 2014-01-18 NOTE — ED Notes (Signed)
Left shoulder pain. Did not follow up with dr. Rolena Hall.  Seen 9/19 for lt shoulder pain. Now, c/o lt. Sided cp - radiating up left shoulder and to back.  Sob with exertion.  Pt. Did take percocet with no relief ~ 0900

## 2014-02-08 ENCOUNTER — Ambulatory Visit (INDEPENDENT_AMBULATORY_CARE_PROVIDER_SITE_OTHER): Payer: BC Managed Care – PPO | Admitting: Family Medicine

## 2014-02-08 ENCOUNTER — Encounter: Payer: Self-pay | Admitting: Family Medicine

## 2014-02-08 VITALS — BP 153/89 | HR 120 | Temp 98.4°F | Wt 321.0 lb

## 2014-02-08 DIAGNOSIS — M25512 Pain in left shoulder: Secondary | ICD-10-CM

## 2014-02-08 DIAGNOSIS — R300 Dysuria: Secondary | ICD-10-CM

## 2014-02-08 DIAGNOSIS — M79602 Pain in left arm: Secondary | ICD-10-CM | POA: Insufficient documentation

## 2014-02-08 DIAGNOSIS — N390 Urinary tract infection, site not specified: Secondary | ICD-10-CM | POA: Insufficient documentation

## 2014-02-08 LAB — POCT URINALYSIS DIPSTICK
Bilirubin, UA: NEGATIVE
Glucose, UA: >=1000
KETONES UA: NEGATIVE
Nitrite, UA: NEGATIVE
PH UA: 5.5
PROTEIN UA: NEGATIVE
Spec Grav, UA: 1.01
UROBILINOGEN UA: 1

## 2014-02-08 LAB — POCT UA - MICROSCOPIC ONLY

## 2014-02-08 MED ORDER — PREDNISONE 10 MG PO TABS
20.0000 mg | ORAL_TABLET | Freq: Two times a day (BID) | ORAL | Status: DC
Start: 2014-02-08 — End: 2014-03-05

## 2014-02-08 MED ORDER — OXYCODONE-ACETAMINOPHEN 5-325 MG PO TABS: 1.0000 | ORAL_TABLET | Freq: Four times a day (QID) | ORAL | Status: DC | PRN

## 2014-02-08 MED ORDER — CEPHALEXIN 500 MG PO CAPS: 500.0000 mg | ORAL_CAPSULE | Freq: Two times a day (BID) | ORAL | Status: DC
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
## 2014-02-08 NOTE — Patient Instructions (Signed)
Dear Ray Hall, Thank you for coming in to clinic today.  1. For your Urinary Tract Infection - take new antibiotic - Keflex 500mg  twice daily for 7 days. We will check a urine culture and call you if we need to change this antibiotic. 2. If you still have burning when you urinate after 7 to 10 days, please call the Urologist office and schedule an appointment for follow-up for Urinary Tract Infection. 3. For your Left Arm - start taking Prednisone and Percocet for pain as prescribed until you can get in to see the Orthopedic doctor on Friday - they will advise you further on potential surgeries or pain control / treatment options  Please schedule a follow-up appointment with Dr. Lamar Benes in 1 month for follow-up as needed if any symptoms persist or sooner if worsening.  If you have any other questions or concerns, please feel free to call the clinic to contact me. You may also schedule an earlier appointment if necessary.  However, if your symptoms get significantly worse, please go to the Emergency Department to seek immediate medical attention.  Nobie Putnam, [place]
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
## 2014-02-09 LAB — URINE CULTURE
Colony Count: NO GROWTH
Organism ID, Bacteria: NO GROWTH

## 2014-02-10 ENCOUNTER — Telehealth: Payer: Self-pay | Admitting: Family Medicine

## 2014-02-10 NOTE — Telephone Encounter (Signed)
Called patient, spoke with Ray Hall, reported recent results with negative Urine Culture (No Growth), last OV on 02/08/14 with dysuria and foul odor and grossly positive UA, started empiric course Keflex at that time. Patient stated he has started Keflex, tolerating well, and currently no longer has any foul odor and improved dysuria. No new complaints. I advised patient to finish taking current Keflex course, despite negative culture since he is having clinical improvement, unsure if related to prior antibiotic course per Urology. However, stated that if patient's symptoms do not resolve after 7 day antibiotic course, or any worsening, would advise patient to call Urology and schedule a follow-up apt for further testing in setting of complex bladder history. May follow-up with [facility], to repeat UA and Urine Culture, otherwise limited utility.  Nobie Putnam, [place], PGY-2

## 2014-02-27 ENCOUNTER — Encounter (HOSPITAL_COMMUNITY): Payer: Self-pay | Admitting: Emergency Medicine

## 2014-02-27 ENCOUNTER — Emergency Department (HOSPITAL_COMMUNITY)
Admission: EM | Admit: 2014-02-27 | Discharge: 2014-02-28 | Disposition: A | Discharge status: Home / Self Care | Payer: BC Managed Care – PPO | Attending: Emergency Medicine | Admitting: Emergency Medicine

## 2014-02-27 DIAGNOSIS — E119 Type 2 diabetes mellitus without complications: Secondary | ICD-10-CM | POA: Insufficient documentation

## 2014-02-27 DIAGNOSIS — Z862 Personal history of diseases of the blood and blood-forming organs and certain disorders involving the immune mechanism: Secondary | ICD-10-CM | POA: Diagnosis not present

## 2014-02-27 DIAGNOSIS — R319 Hematuria, unspecified: Secondary | ICD-10-CM | POA: Insufficient documentation

## 2014-02-27 DIAGNOSIS — Z720 Tobacco use: Secondary | ICD-10-CM | POA: Insufficient documentation

## 2014-02-27 DIAGNOSIS — Z9289 Personal history of other medical treatment: Secondary | ICD-10-CM | POA: Insufficient documentation

## 2014-02-27 DIAGNOSIS — Z87440 Personal history of urinary (tract) infections: Secondary | ICD-10-CM | POA: Diagnosis not present

## 2014-02-27 DIAGNOSIS — Z792 Long term (current) use of antibiotics: Secondary | ICD-10-CM | POA: Diagnosis not present

## 2014-02-27 DIAGNOSIS — Z7952 Long term (current) use of systemic steroids: Secondary | ICD-10-CM | POA: Insufficient documentation

## 2014-02-27 DIAGNOSIS — Z87828 Personal history of other (healed) physical injury and trauma: Secondary | ICD-10-CM | POA: Diagnosis not present

## 2014-02-27 DIAGNOSIS — I10 Essential (primary) hypertension: Secondary | ICD-10-CM | POA: Diagnosis not present

## 2014-02-27 DIAGNOSIS — Z79899 Other long term (current) drug therapy: Secondary | ICD-10-CM | POA: Insufficient documentation

## 2014-02-27 DIAGNOSIS — R339 Retention of urine, unspecified: Secondary | ICD-10-CM | POA: Insufficient documentation

## 2014-02-27 DIAGNOSIS — Z9889 Other specified postprocedural states: Secondary | ICD-10-CM | POA: Diagnosis not present

## 2014-02-27 LAB — CBC WITH DIFFERENTIAL/PLATELET
Basophils Absolute: 0 10*3/uL (ref 0.0–0.1)
Basophils Relative: 0 % (ref 0–1)
Eosinophils Absolute: 0.1 10*3/uL (ref 0.0–0.7)
Eosinophils Relative: 1 % (ref 0–5)
HCT: 32.6 % — ABNORMAL LOW (ref 39.0–52.0)
HEMOGLOBIN: 11.3 g/dL — AB (ref 13.0–17.0)
LYMPHS ABS: 1.9 10*3/uL (ref 0.7–4.0)
LYMPHS PCT: 16 % (ref 12–46)
MCH: 27.6 pg (ref 26.0–34.0)
MCHC: 34.7 g/dL (ref 30.0–36.0)
MCV: 79.5 fL (ref 78.0–100.0)
MONOS PCT: 8 % (ref 3–12)
Monocytes Absolute: 0.9 10*3/uL (ref 0.1–1.0)
NEUTROS PCT: 75 % (ref 43–77)
Neutro Abs: 8.8 10*3/uL — ABNORMAL HIGH (ref 1.7–7.7)
Platelets: 258 10*3/uL (ref 150–400)
RBC: 4.1 MIL/uL — AB (ref 4.22–5.81)
RDW: 12.9 % (ref 11.5–15.5)
WBC: 11.8 10*3/uL — AB (ref 4.0–10.5)

## 2014-02-27 LAB — PROTIME-INR
INR: 1.01 (ref 0.00–1.49)
Prothrombin Time: 13.4 seconds (ref 11.6–15.2)

## 2014-02-27 LAB — BASIC METABOLIC PANEL
Anion gap: 19 — ABNORMAL HIGH (ref 5–15)
BUN: 24 mg/dL — ABNORMAL HIGH (ref 6–23)
CHLORIDE: 97 meq/L (ref 96–112)
CO2: 19 meq/L (ref 19–32)
Calcium: 9.2 mg/dL (ref 8.4–10.5)
Creatinine, Ser: 1.57 mg/dL — ABNORMAL HIGH (ref 0.50–1.35)
GFR calc Af Amer: 58 mL/min — ABNORMAL LOW (ref >90)
GFR, EST NON AFRICAN AMERICAN: 50 mL/min — AB (ref >90)
GLUCOSE: 236 mg/dL — AB (ref 70–99)
POTASSIUM: 5.2 meq/L (ref 3.7–5.3)
SODIUM: 135 meq/L — AB (ref 137–147)

## 2014-02-27 MED ORDER — SODIUM CHLORIDE 0.9 % IV BOLUS (SEPSIS)
1000.0000 mL | Freq: Once | INTRAVENOUS | Status: AC
  Administered 2014-02-27: 1000 mL via INTRAVENOUS

## 2014-02-27 MED ORDER — HYDROCODONE-ACETAMINOPHEN 5-325 MG PO TABS
2.0000 | ORAL_TABLET | Freq: Once | ORAL | Status: AC
  Administered 2014-02-28: 2 via ORAL
  Filled 2014-02-27: qty 2
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
## 2014-02-28 LAB — URINALYSIS, ROUTINE W REFLEX MICROSCOPIC
Glucose, UA: 250 mg/dL — AB
Ketones, ur: NEGATIVE mg/dL
Nitrite: NEGATIVE
PH: 6.5 (ref 5.0–8.0)
Specific Gravity, Urine: 1.025 (ref 1.005–1.030)
Urobilinogen, UA: 1 mg/dL (ref 0.0–1.0)

## 2014-02-28 LAB — URINE MICROSCOPIC-ADD ON

## 2014-02-28 MED ORDER — HYDROCODONE-ACETAMINOPHEN 5-325 MG PO TABS: 1.0000 | ORAL_TABLET | Freq: Four times a day (QID) | ORAL | Status: DC | PRN

## 2014-02-28 NOTE — Discharge Instructions (Signed)
You have been diagnosed with HEMATURIA and URINARY RETENTION.  It is important that you follow-up with the urologist and have your CREATININE rechecked in a few days.  Please return if your symptoms worsen.  Hematuria Hematuria is blood in your urine. It can be caused by a bladder infection, kidney infection, prostate infection, kidney stone, or cancer of your urinary tract. Infections can usually be treated with medicine, and a kidney stone usually will pass through your urine. If neither of these is the cause of your hematuria, further workup to find out the reason may be needed. It is very important that you tell your health care provider about any blood you see in your urine, even if the blood stops without treatment or happens without causing pain. Blood in your urine that happens and then stops and then happens again can be a symptom of a very serious condition. Also, pain is not a symptom in the initial stages of many urinary cancers. HOME CARE INSTRUCTIONS   Drink lots of fluid, 3-4 quarts a day. If you have been diagnosed with an infection, cranberry juice is especially recommended, in addition to large amounts of water.  Avoid caffeine, tea, and carbonated beverages because they tend to irritate the bladder.  Avoid alcohol because it may irritate the prostate.  Take all medicines as directed by your health care provider.  If you were prescribed an antibiotic medicine, finish it all even if you start to feel better.  If you have been diagnosed with a kidney stone, follow your health care provider's instructions regarding straining your urine to catch the stone.  Empty your bladder often. Avoid holding urine for long periods of time.  After a bowel movement, women should cleanse front to back. Use each tissue only once.  Empty your bladder before and after sexual intercourse if you are a male. SEEK MEDICAL CARE IF:  You develop back pain.  You have a fever.  You have a feeling  of sickness in your stomach (nausea) or vomiting.  Your symptoms are not better in 3 days. Return sooner if you are getting worse. SEEK IMMEDIATE MEDICAL CARE IF:   You develop severe vomiting and are unable to keep the medicine down.  You develop severe back or abdominal pain despite taking your medicines.  You begin passing a large amount of blood or clots in your urine.  You feel extremely weak or faint, or you pass out. MAKE SURE YOU:   Understand these instructions.  Will watch your condition.  Will get help right away if you are not doing well or get worse. Document Released: 04/15/2005 Document Revised: 08/30/2013 Document Reviewed: 12/14/2012 [facility] Patient Information 2015 [place], [place]. This information is not intended to replace advice given to you by your health care provider. Make sure you discuss any questions you have with your health care provider.

## 2014-03-02 ENCOUNTER — Emergency Department (HOSPITAL_COMMUNITY): Payer: BC Managed Care – PPO

## 2014-03-02 ENCOUNTER — Inpatient Hospital Stay (HOSPITAL_COMMUNITY)
Admission: EM | Admit: 2014-03-02 | Discharge: 2014-03-05 | DRG: 871 | Disposition: A | Discharge status: Home / Self Care | Payer: BC Managed Care – PPO | Attending: Family Medicine | Admitting: Family Medicine

## 2014-03-02 ENCOUNTER — Encounter (HOSPITAL_COMMUNITY): Payer: Self-pay

## 2014-03-02 DIAGNOSIS — A419 Sepsis, unspecified organism: Secondary | ICD-10-CM | POA: Diagnosis present

## 2014-03-02 DIAGNOSIS — F17290 Nicotine dependence, other tobacco product, uncomplicated: Secondary | ICD-10-CM | POA: Diagnosis present

## 2014-03-02 DIAGNOSIS — N170 Acute kidney failure with tubular necrosis: Secondary | ICD-10-CM | POA: Diagnosis present

## 2014-03-02 DIAGNOSIS — N319 Neuromuscular dysfunction of bladder, unspecified: Secondary | ICD-10-CM | POA: Diagnosis present

## 2014-03-02 DIAGNOSIS — E119 Type 2 diabetes mellitus without complications: Secondary | ICD-10-CM | POA: Diagnosis present

## 2014-03-02 DIAGNOSIS — I509 Heart failure, unspecified: Secondary | ICD-10-CM | POA: Diagnosis present

## 2014-03-02 DIAGNOSIS — R310 Gross hematuria: Secondary | ICD-10-CM | POA: Diagnosis present

## 2014-03-02 DIAGNOSIS — N342 Other urethritis: Secondary | ICD-10-CM

## 2014-03-02 DIAGNOSIS — G8929 Other chronic pain: Secondary | ICD-10-CM | POA: Diagnosis present

## 2014-03-02 DIAGNOSIS — R6521 Severe sepsis with septic shock: Secondary | ICD-10-CM | POA: Diagnosis present

## 2014-03-02 DIAGNOSIS — E875 Hyperkalemia: Secondary | ICD-10-CM | POA: Diagnosis present

## 2014-03-02 DIAGNOSIS — B9620 Unspecified Escherichia coli [E. coli] as the cause of diseases classified elsewhere: Secondary | ICD-10-CM | POA: Diagnosis present

## 2014-03-02 DIAGNOSIS — G9340 Encephalopathy, unspecified: Secondary | ICD-10-CM | POA: Diagnosis present

## 2014-03-02 DIAGNOSIS — M545 Low back pain: Secondary | ICD-10-CM | POA: Diagnosis present

## 2014-03-02 DIAGNOSIS — I10 Essential (primary) hypertension: Secondary | ICD-10-CM | POA: Diagnosis present

## 2014-03-02 DIAGNOSIS — Z79899 Other long term (current) drug therapy: Secondary | ICD-10-CM | POA: Diagnosis not present

## 2014-03-02 DIAGNOSIS — IMO0001 Reserved for inherently not codable concepts without codable children: Secondary | ICD-10-CM | POA: Insufficient documentation

## 2014-03-02 DIAGNOSIS — T83511A Infection and inflammatory reaction due to indwelling urethral catheter, initial encounter: Secondary | ICD-10-CM

## 2014-03-02 DIAGNOSIS — N390 Urinary tract infection, site not specified: Secondary | ICD-10-CM | POA: Diagnosis present

## 2014-03-02 DIAGNOSIS — E872 Acidosis: Secondary | ICD-10-CM | POA: Diagnosis present

## 2014-03-02 DIAGNOSIS — R55 Syncope and collapse: Secondary | ICD-10-CM | POA: Diagnosis present

## 2014-03-02 LAB — CBC WITH DIFFERENTIAL/PLATELET
BASOS ABS: 0 10*3/uL (ref 0.0–0.1)
Basophils Relative: 0 % (ref 0–1)
Eosinophils Absolute: 0.1 10*3/uL (ref 0.0–0.7)
Eosinophils Relative: 1 % (ref 0–5)
HCT: 33 % — ABNORMAL LOW (ref 39.0–52.0)
Hemoglobin: 11.4 g/dL — ABNORMAL LOW (ref 13.0–17.0)
LYMPHS PCT: 20 % (ref 12–46)
Lymphs Abs: 2.2 10*3/uL (ref 0.7–4.0)
MCH: 27.6 pg (ref 26.0–34.0)
MCHC: 34.5 g/dL (ref 30.0–36.0)
MCV: 79.9 fL (ref 78.0–100.0)
Monocytes Absolute: 0.7 10*3/uL (ref 0.1–1.0)
Monocytes Relative: 7 % (ref 3–12)
NEUTROS ABS: 7.9 10*3/uL — AB (ref 1.7–7.7)
Neutrophils Relative %: 72 % (ref 43–77)
PLATELETS: 233 10*3/uL (ref 150–400)
RBC: 4.13 MIL/uL — AB (ref 4.22–5.81)
RDW: 12.9 % (ref 11.5–15.5)
WBC: 10.8 10*3/uL — AB (ref 4.0–10.5)

## 2014-03-02 LAB — COMPREHENSIVE METABOLIC PANEL
ALT: 20 U/L (ref 0–53)
AST: 11 U/L (ref 0–37)
Albumin: 2.9 g/dL — ABNORMAL LOW (ref 3.5–5.2)
Alkaline Phosphatase: 75 U/L (ref 39–117)
Anion gap: 23 — ABNORMAL HIGH (ref 5–15)
BILIRUBIN TOTAL: 0.3 mg/dL (ref 0.3–1.2)
BUN: 32 mg/dL — ABNORMAL HIGH (ref 6–23)
CALCIUM: 9.1 mg/dL (ref 8.4–10.5)
CHLORIDE: 100 meq/L (ref 96–112)
CO2: 13 meq/L — AB (ref 19–32)
Creatinine, Ser: 2.08 mg/dL — ABNORMAL HIGH (ref 0.50–1.35)
GFR calc Af Amer: 41 mL/min — ABNORMAL LOW (ref >90)
GFR calc non Af Amer: 35 mL/min — ABNORMAL LOW (ref >90)
Glucose, Bld: 347 mg/dL — ABNORMAL HIGH (ref 70–99)
POTASSIUM: 5.4 meq/L — AB (ref 3.7–5.3)
SODIUM: 136 meq/L — AB (ref 137–147)
Total Protein: 6.8 g/dL (ref 6.0–8.3)

## 2014-03-02 LAB — I-STAT TROPONIN, ED: Troponin i, poc: 0 ng/mL (ref 0.00–0.08)

## 2014-03-02 LAB — URINE MICROSCOPIC-ADD ON

## 2014-03-02 LAB — URINALYSIS, ROUTINE W REFLEX MICROSCOPIC
GLUCOSE, UA: 100 mg/dL — AB
Ketones, ur: 15 mg/dL — AB
Nitrite: POSITIVE — AB
Protein, ur: >300 mg/dL — AB
Specific Gravity, Urine: 1.022 (ref 1.005–1.030)
Urobilinogen, UA: 1 mg/dL (ref 0.0–1.0)
pH: 6 (ref 5.0–8.0)

## 2014-03-02 LAB — I-STAT CG4 LACTIC ACID, ED
LACTIC ACID, VENOUS: 2.23 mmol/L — AB (ref 0.5–2.2)
Lactic Acid, Venous: 5.44 mmol/L — ABNORMAL HIGH (ref 0.5–2.2)

## 2014-03-02 LAB — TROPONIN I: Troponin I: <0.3 ng/mL (ref <0.30)

## 2014-03-02 LAB — GLUCOSE, CAPILLARY: Glucose-Capillary: 199 mg/dL — ABNORMAL HIGH (ref 70–99)

## 2014-03-02 MED ORDER — DEXTROSE 5 % IV SOLN
2.0000 g | Freq: Three times a day (TID) | INTRAVENOUS | Status: DC
Start: 2014-03-02 — End: 2014-03-02

## 2014-03-02 MED ORDER — VANCOMYCIN HCL IN DEXTROSE 1-5 GM/200ML-% IV SOLN
1000.0000 mg | Freq: Once | INTRAVENOUS | Status: AC
  Administered 2014-03-02: 1000 mg via INTRAVENOUS
  Filled 2014-03-02: qty 200

## 2014-03-02 MED ORDER — DIAZEPAM 5 MG PO TABS: 5.0000 mg | ORAL_TABLET | Freq: Two times a day (BID) | ORAL | Status: DC

## 2014-03-02 MED ORDER — VANCOMYCIN HCL 10 G IV SOLR
1750.0000 mg | INTRAVENOUS | Status: DC
  Administered 2014-03-03: 1750 mg via INTRAVENOUS
  Filled 2014-03-02 (×2): qty 1750

## 2014-03-02 MED ORDER — ACETAMINOPHEN 325 MG PO TABS: 650.0000 mg | ORAL_TABLET | Freq: Four times a day (QID) | ORAL | Status: DC | PRN

## 2014-03-02 MED ORDER — SODIUM CHLORIDE 0.9 % IV BOLUS (SEPSIS)
1000.0000 mL | INTRAVENOUS | Status: DC
  Administered 2014-03-02: 1000 mL via INTRAVENOUS

## 2014-03-02 MED ORDER — INSULIN ASPART 100 UNIT/ML ~~LOC~~ SOLN
0.0000 [IU] | Freq: Three times a day (TID) | SUBCUTANEOUS | Status: DC
  Administered 2014-03-03 (×3): 2 [IU] via SUBCUTANEOUS
  Administered 2014-03-04: 3 [IU] via SUBCUTANEOUS
  Administered 2014-03-04 (×2): 2 [IU] via SUBCUTANEOUS

## 2014-03-02 MED ORDER — SODIUM CHLORIDE 0.9 % IV SOLN
INTRAVENOUS | Status: DC
  Administered 2014-03-02 – 2014-03-03 (×2): via INTRAVENOUS
  Administered 2014-03-04: 100 mL/h via INTRAVENOUS
  Administered 2014-03-05: 04:00:00 via INTRAVENOUS

## 2014-03-02 MED ORDER — CYCLOBENZAPRINE HCL 10 MG PO TABS: 10.0000 mg | ORAL_TABLET | Freq: Three times a day (TID) | ORAL | Status: DC | PRN

## 2014-03-02 MED ORDER — SODIUM CHLORIDE 0.9 % IJ SOLN
3.0000 mL | Freq: Two times a day (BID) | INTRAMUSCULAR | Status: DC
  Administered 2014-03-02 – 2014-03-04 (×4): 3 mL via INTRAVENOUS

## 2014-03-02 MED ORDER — SENNA 8.6 MG PO TABS
1.0000 | ORAL_TABLET | Freq: Two times a day (BID) | ORAL | Status: DC
  Administered 2014-03-03 – 2014-03-05 (×3): 8.6 mg via ORAL
  Filled 2014-03-02 (×7): qty 1

## 2014-03-02 MED ORDER — DEXTROSE 5 % IV SOLN
2.0000 g | Freq: Once | INTRAVENOUS | Status: AC
  Administered 2014-03-02: 2 g via INTRAVENOUS
  Filled 2014-03-02: qty 2

## 2014-03-02 MED ORDER — PANTOPRAZOLE SODIUM 40 MG PO TBEC
40.0000 mg | DELAYED_RELEASE_TABLET | Freq: Once | ORAL | Status: AC
  Administered 2014-03-02: 40 mg via ORAL
  Filled 2014-03-02: qty 1

## 2014-03-02 MED ORDER — HYDROCODONE-ACETAMINOPHEN 5-325 MG PO TABS
1.0000 | ORAL_TABLET | Freq: Four times a day (QID) | ORAL | Status: DC | PRN
  Administered 2014-03-03 – 2014-03-05 (×8): 2 via ORAL
  Filled 2014-03-02 (×8): qty 2

## 2014-03-02 MED ORDER — VANCOMYCIN HCL 10 G IV SOLR
1500.0000 mg | Freq: Once | INTRAVENOUS | Status: AC
  Administered 2014-03-02: 1500 mg via INTRAVENOUS
  Filled 2014-03-02: qty 1500

## 2014-03-02 MED ORDER — ONDANSETRON HCL 4 MG/2ML IJ SOLN: 4.0000 mg | Freq: Four times a day (QID) | INTRAMUSCULAR | Status: DC | PRN

## 2014-03-02 MED ORDER — PIPERACILLIN-TAZOBACTAM 3.375 G IVPB
3.3750 g | Freq: Three times a day (TID) | INTRAVENOUS | Status: DC
  Administered 2014-03-02 – 2014-03-04 (×5): 3.375 g via INTRAVENOUS
  Filled 2014-03-02 (×7): qty 50

## 2014-03-02 MED ORDER — SODIUM CHLORIDE 0.9 % IV BOLUS (SEPSIS)
1000.0000 mL | Freq: Once | INTRAVENOUS | Status: AC
  Administered 2014-03-02: 1000 mL via INTRAVENOUS

## 2014-03-02 MED ORDER — HEPARIN SODIUM (PORCINE) 5000 UNIT/ML IJ SOLN
5000.0000 [IU] | Freq: Three times a day (TID) | INTRAMUSCULAR | Status: DC
  Administered 2014-03-02 – 2014-03-05 (×8): 5000 [IU] via SUBCUTANEOUS
  Filled 2014-03-02 (×9): qty 1

## 2014-03-02 MED ORDER — CEFEPIME HCL 2 G IJ SOLR
2.0000 g | Freq: Two times a day (BID) | INTRAMUSCULAR | Status: DC
  Filled 2014-03-02: qty 2

## 2014-03-02 MED ORDER — ACETAMINOPHEN 650 MG RE SUPP: 650.0000 mg | Freq: Four times a day (QID) | RECTAL | Status: DC | PRN

## 2014-03-02 MED ORDER — GABAPENTIN 100 MG PO CAPS
100.0000 mg | ORAL_CAPSULE | Freq: Three times a day (TID) | ORAL | Status: DC
  Administered 2014-03-03 – 2014-03-05 (×7): 100 mg via ORAL
  Filled 2014-03-02 (×10): qty 1

## 2014-03-02 NOTE — H&P (Signed)
[facility] Admission History and Physical Service Pager: [phone]  Patient name: Ray Hall Medical record number: [id number] Date of birth: 08-May-1960 Age: 53 y.o. Gender: male  Primary Care Provider: Tawanna Sat, MD Consultants: None  Code Status: Full   Chief Complaint: Sepsis   Assessment and Plan: Ray Hall is a 53 y.o. male presenting with Sepsis most likely 2/2 urinary tract infection . PMH is significant for DM II, HTN,   #Severe Sepsis, 2/2 most likely UTI - Has responded well to fluids, UA w/ microscopy looks extremely infected.  CXR non revealing and do not appreciate any open ulcerations for secondary causes.  Lactic Acid has trended down after fluids and BP has responded nicely - Admit to SDU, vitals per unit  - Start on emperic vanc/zosyn in diabetic pt w/ recent instrumentation.  However, UA does appear to be caused from gram negative organism as + for LE/Nitrites.  Narrow ABx as indicated, previously on Keflex in the past 2 weeks for UTI - F/U BCx and UCx - AM CBC/BMET  - NS bolus as needed or indicated.  Could also consider hydrocortisone while in SDU - Consider discussion with [facility] about continual hematuria and recurrent UTI's   #Presyncope/Syncope - From description, sounds orthostatic as trying to arise, especially with decreased PO intake and infection.  However, it is reasonable to obtain one more troponin and monitor on telemetry for possible cardiogenic origin. - Obtain trop - AM EKG - Telemetry overnight   #DM II controlled- Last a1C 6.8 in 11/19/13 - Hold home medications - Start on sensitive SSI as may need increased insulin requirement in acute infection  #HTN - Hold home HTN medications in setting of sepsis - Restart as clinical status improves  #Hx of CHF/Paroxysmal Afib? - Per clinic note from 09/23/13 by Dr. Marily Memos  - Previously had ablation in 2012 - Hold home Cardizem and Lasix, unknown  reason why he is on this? - May need to obtain BNP and evaluate for CHF if becomes fluid overloaded from sepsis bolus  #AKI - Baseline creatinine appears to be around 0.72-0.9 - Creatinine on admission is 2.08 - Most likely ATN with multiple granular casts  - Hold nephrotoxic agents - Continue with fluids   #Hyperkalemia - Most likely 2/2 renal failure/acidemia, repeat in AM   #AG metabolic acidosis - most likely 2/2 to lactic acidosis - Repeat in AM, continue with fluids   FEN/GI: NS @ 100 cc/hr Prophylaxis: SQ Heparin in setting of renal failure  Disposition: SDU pending improvement   History of Present Illness: Ray Hall is a 53 y.o. male presenting with pre-syncope/sepsis.  Pt states that he went to the ED about 3 days ago due to gross hematuria, with no inciting event.  He was evaluated in the ED at that time and given Keflex for possible UTI.  However, pt was having chills and had subjective fevers since that time up until today.  Today, started having some chills, at which point his wife took him to the ED.  Upon arrival at the ED, pt had what is described as "syncopal" event where he lost consciousness upon trying to get out of the car.  However, he remained seated in the car when this happened and denies hitting head or other parts of his body.  Otherwise, pt denies any chest pain, dyspnea at rest or with activity, HA, blurred vision, hematochezia, lower extremity edema, vomiting.  Of note, pt did have a  recent cystoscopy by Dr. Gaynelle Arabian at [facility] for repeated gross hematuria.  Per pt, there was nothing found on the exam other than a few small diverticula of the urethra.  He denies alcohol use but does smoke about 2 cigars per day for the past 30 years.  Denies illicit drug use.  In the ED today, pt had extensive w/u including CBC showing WBC of 10.8, CMP w/ creatinine of 2.08, AG of 23, UA with hyaline casts/bacteria many, LA of 5 initially which trended down after  fluids, and BCx and UCx drawn prior to cefepime and vancomycin given.   Review Of Systems: Per HPI   Patient Active Problem List   Diagnosis Date Noted  . Sepsis 03/02/2014  . UTI (urinary tract infection) 02/08/2014  . Pain of left upper extremity 02/08/2014  . Left rib fracture 12/03/2013  . Contusion of bladder 12/03/2013  . Acute encephalopathy 08/27/2013  . Musculoskeletal pain 08/09/2013  . Right leg swelling 04/09/2013  . Shoulder injury 11/06/2012  . Erectile dysfunction 11/06/2012  . Lower extremity pain, right 01/15/2012  . BENIGN PROSTATIC HYPERTROPHY, WITH OBSTRUCTION 05/25/2010  . LOW BACK PAIN SYNDROME, SEVERE 04/16/2010  . History of recurrent UTIs 12/09/2008  . DIABETES MELLITUS, TYPE II 06/26/2008  . HYPERTENSION 06/24/2008   Past Medical History: Past Medical History  Diagnosis Date  . Sickle cell trait   . Normal nuclear stress test 07/2010    low prob of ischemia, EF 42%  . Echocardiogram abnormal     moderate LVH, mild LV hypokenesis, EF 42%  . History of Doppler ultrasound 07/2010    negative for DVT  . Abnormal CT of the abdomen     mild L hydronephrosis and hydroureter  . Status post radiofrequency ablation for arrhythmia 10/16/10    [facility] MD)  . Diabetes mellitus   . Hypertension    Past Surgical History: Past Surgical History  Procedure Laterality Date  . Radiofrequency ablation  10/16/10    Cardiac for atrial arrythmia, [facility]  . Dental extractions  10/15/10    prior to ablation   Social History: History  Substance Use Topics  . Smoking status: Current Every Day Smoker -- 1.50 packs/day for 17 years    Types: Cigars  . Smokeless tobacco: Never Used     Comment: 2 cigars  . Alcohol Use: No    Family History: Family History  Problem Relation Age of Onset  . Sickle cell trait    . Heart failure Father   . Diabetes Father   . Diabetes Mother   . Hypertension Mother    Allergies and Medications: No Known Allergies No  current facility-administered medications on file prior to encounter.   Current Outpatient Prescriptions on File Prior to Encounter  Medication Sig Dispense Refill  . cyclobenzaprine (FLEXERIL) 10 MG tablet Take 10 mg by mouth 3 (three) times daily as needed for muscle spasms.    Marland Kitchen diltiazem (CARDIZEM) 60 MG tablet Take 60 mg by mouth 4 (four) times daily.    . furosemide (LASIX) 40 MG tablet Take 40 mg by mouth 2 (two) times daily.     Marland Kitchen gabapentin (NEURONTIN) 100 MG capsule Take 100 mg by mouth 3 (three) times daily.    Marland Kitchen glipiZIDE (GLUCOTROL) 10 MG tablet Take 10 mg by mouth 2 (two) times daily before a meal.    . HYDROcodone-acetaminophen (NORCO/VICODIN) 5-325 MG per tablet Take 1-2 tablets by mouth every 6 (six) hours as needed for moderate pain  or severe pain. 10 tablet 0  . lisinopril (PRINIVIL,ZESTRIL) 20 MG tablet Take 20 mg by mouth daily.    . metFORMIN (GLUCOPHAGE) 1000 MG tablet Take 1,000 mg by mouth 2 (two) times daily with a meal.    . Aspirin-Caffeine (BC FAST PAIN RELIEF ARTHRITIS) 1000-65 MG PACK Take 1 tablet by mouth once.    . cephALEXin (KEFLEX) 500 MG capsule Take 1 capsule (500 mg total) by mouth 2 (two) times daily. 14 capsule 0  . diazepam (VALIUM) 5 MG tablet Take 5 mg by mouth 2 (two) times daily.    Marland Kitchen oxyCODONE-acetaminophen (PERCOCET) 5-325 MG per tablet Take 1 tablet by mouth every 6 (six) hours as needed. 30 tablet 0  . predniSONE (DELTASONE) 10 MG tablet Take 2 tablets (20 mg total) by mouth 2 (two) times daily. 20 tablet 0    Objective: BP 104/65 mmHg  Pulse 102  Temp(Src) 97.7 F (36.5 C) (Oral)  Resp 20  Ht 6\' 7"  (2.007 m)  Wt 320 lb (145.151 kg)  BMI 36.04 kg/m2  SpO2 89% Exam: General: NAD, comfortable in bed HEENT: [place]/AT, Dry mucous membranes Cardiovascular: Sinus tachycardia, no murmur appreciated Respiratory: CTAB Abdomen: Soft/NT/ND, NABS Extremities: No pedal edema B/L, pulses +2 b/l LE Skin: No rashes Neuro: NO focal deficits, AAO x  3  Labs and Imaging: CBC BMET   Recent Labs Lab 03/02/14 1310  WBC 10.8*  HGB 11.4*  HCT 33.0*  PLT 233    Recent Labs Lab 03/02/14 1310  NA 136*  K 5.4*  CL 100  CO2 13*  BUN 32*  CREATININE 2.08*  GLUCOSE 347*  CALCIUM 9.1     CXR - No acute cardiopulmonary abnormality noted  EKG - Sinus tachycardia, slightly prologned qtc, previous anteriorseptal infarct, no changes from previous   Lactic Acid, Venous 5.44 --> 2.23 after fluids   Istat Trop - Negative   BCx pending   UA - + LE, + Nitrites   Nolon Rod, DO 03/02/2014, 5:54 PM PGY-3, [place] Intern pager: [phone], text pages welcome

## 2014-03-02 NOTE — Progress Notes (Signed)
ANTIBIOTIC CONSULT NOTE - INITIAL  Pharmacy Consult for vancomycin Indication: sepsis  No Known Allergies  Patient Measurements: Height: 6\' 7"  (200.7 cm) Weight: (!) 303 lb 9.2 oz (137.7 kg) IBW/kg (Calculated) : 93.7 Adjusted Body Weight: 111.3kg  Vital Signs: Temp: 97.7 F (36.5 C) (11/04 1546) Temp Source: Oral (11/04 1546) BP: 138/78 mmHg (11/04 1925) Pulse Rate: 96 (11/04 1925) Intake/Output from previous day:   Intake/Output from this shift:    Labs:  Recent Labs  02/27/14 2246 03/02/14 1310  WBC 11.8* 10.8*  HGB 11.3* 11.4*  PLT 258 233  CREATININE 1.57* 2.08*   Estimated Creatinine Clearance: 66.9 mL/min (by C-G formula based on Cr of 2.08). No results for input(s): VANCOTROUGH, VANCOPEAK, VANCORANDOM, GENTTROUGH, GENTPEAK, GENTRANDOM, TOBRATROUGH, TOBRAPEAK, TOBRARND, AMIKACINPEAK, AMIKACINTROU, AMIKACIN in the last 72 hours.   Microbiology: Recent Results (from the past 720 hour(s))  Urine culture     Status: None   Collection Time: 02/08/14  3:52 PM  Result Value Ref Range Status   Colony Count NO GROWTH  Final   Organism ID, Bacteria NO GROWTH  Final    Medical History: Past Medical History  Diagnosis Date  . Sickle cell trait   . Normal nuclear stress test 07/2010    low prob of ischemia, EF 42%  . Echocardiogram abnormal     moderate LVH, mild LV hypokenesis, EF 42%  . History of Doppler ultrasound 07/2010    negative for DVT  . Abnormal CT of the abdomen     mild L hydronephrosis and hydroureter  . Status post radiofrequency ablation for arrhythmia 10/16/10    [facility] MD)  . Diabetes mellitus   . Hypertension     Assessment: 66 YOM transferred from [facility] with sepsis likely from UTI. Was on Keflex the past 2 weeks for a UTI as well. Received cefepime 2g in [facility] ED at 1251- now changed to Zosyn. Also received vancomycin 1g IV at 1340 in the ED. SCr 2.08 with est CrCl ~67, however due to patient's weight a normalized CrCl is  9mL/min.  Goal of Therapy:  Vancomycin trough level 15-20 mcg/ml  Plan:  1. Cont Zosyn 3.375g IV q8h EI 2. Not fully loaded with vancomycin- give another 1500mg  IV x1 now to complete a 2500mg  IV load (18mg /kg) 3. Start vancomycin 1750mg  IV q24h starting tomorrow 4. Follow c/s, clinical progression, renal function, trough at [place] D. Zoee Heeney, PharmD, BCPS Clinical Pharmacist Pager: [phone] 03/02/2014 8:47 PM

## 2014-03-02 NOTE — ED Notes (Signed)
Attempted to call report to [place]

## 2014-03-02 NOTE — Progress Notes (Addendum)
ANTIBIOTIC CONSULT NOTE - INITIAL  Pharmacy Consult for Cefepime Indication: Urosepsis  No Known Allergies  Patient Measurements: Height: 6\' 7"  (200.7 cm) Weight: (!) 320 lb (145.151 kg) IBW/kg (Calculated) : 93.7   Vital Signs: Temp: 98.9 F (37.2 C) (11/04 1415) Temp Source: Rectal (11/04 1415) BP: 118/72 mmHg (11/04 1400) Pulse Rate: 93 (11/04 1400) Intake/Output from previous day:   Intake/Output from this shift:    Labs:  Recent Labs  02/27/14 2246 03/02/14 1310  WBC 11.8* 10.8*  HGB 11.3* 11.4*  PLT 258 233  CREATININE 1.57* 2.08*   Estimated Creatinine Clearance: 68.7 mL/min (by C-G formula based on Cr of 2.08). No results for input(s): VANCOTROUGH, VANCOPEAK, VANCORANDOM, GENTTROUGH, GENTPEAK, GENTRANDOM, TOBRATROUGH, TOBRAPEAK, TOBRARND, AMIKACINPEAK, AMIKACINTROU, AMIKACIN in the last 72 hours.   Microbiology: Recent Results (from the past 720 hour(s))  Urine culture     Status: None   Collection Time: 02/08/14  3:52 PM  Result Value Ref Range Status   Colony Count NO GROWTH  Final   Organism ID, Bacteria NO GROWTH  Final    Medical History: Past Medical History  Diagnosis Date  . Sickle cell trait   . Normal nuclear stress test 07/2010    low prob of ischemia, EF 42%  . Echocardiogram abnormal     moderate LVH, mild LV hypokenesis, EF 42%  . History of Doppler ultrasound 07/2010    negative for DVT  . Abnormal CT of the abdomen     mild L hydronephrosis and hydroureter  . Status post radiofrequency ablation for arrhythmia 10/16/10    [facility] MD)  . Diabetes mellitus   . Hypertension     Assessment: As obtained from the medical record, patient is a 53 y/o M with PMH of sickle cell trait, DM, HTN, and recent urinary retention with foley placement who presents to ED with generalized weakness and decreased PO intake.  Pharmacy consulted to assist with Cefepime dosing for urosepsis. First dose given in the ED.  Goal of Therapy:   Appropriate antibiotic dosing for renal function; eradication of infection  11/4 >> Vancomycin x 1 in ED 11/4  >> Cefepime >>    WBCs: elevated, 10.8 Renal: AKI, SCr 2.08, CrCl ~ 69 mL/min CG, 43 mL/min N  11/4 blood x 2: sent 11/4 urine: sent  11/4 lactic acid: 5.44  Plan:  1.  Start Cefepime 2 grams IV q12h. 2.  Monitor renal function, cultures, clinical course.   Lindell Spar, PharmD, BCPS Pager: [phone] 03/02/2014 2:35 PM    Upon transfer to [facility] antibiotic changed to Zosyn 3.375 grams iv Q 8 hours  Thank you. Anette Guarneri, PharmD

## 2014-03-02 NOTE — ED Notes (Signed)
Level 1 code sepsis d/t lactate--[facility] notified.

## 2014-03-02 NOTE — ED Notes (Signed)
[place], EDP made aware of patient CG4 Lactic results.

## 2014-03-02 NOTE — ED Notes (Signed)
Making room for pt to emergent to the back.  Vitals unstable.  Charge RN made aware.

## 2014-03-02 NOTE — ED Notes (Signed)
Pt seen recently for passing blood in urine. Pt had foley placed.  Yesterday, pt felt more weak.  States he is "passing out".  Pt c/o back pain.  Unknown for fever
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
## 2014-03-03 DIAGNOSIS — N390 Urinary tract infection, site not specified: Secondary | ICD-10-CM | POA: Insufficient documentation

## 2014-03-03 DIAGNOSIS — T8351XA Infection and inflammatory reaction due to indwelling urinary catheter, initial encounter: Secondary | ICD-10-CM

## 2014-03-03 LAB — GLUCOSE, CAPILLARY
GLUCOSE-CAPILLARY: 152 mg/dL — AB (ref 70–99)
GLUCOSE-CAPILLARY: 145 mg/dL — AB (ref 70–99)
Glucose-Capillary: 194 mg/dL — ABNORMAL HIGH (ref 70–99)
Glucose-Capillary: 186 mg/dL — ABNORMAL HIGH (ref 70–99)

## 2014-03-03 LAB — BASIC METABOLIC PANEL
Anion gap: 15 (ref 5–15)
BUN: 30 mg/dL — ABNORMAL HIGH (ref 6–23)
CHLORIDE: 105 meq/L (ref 96–112)
CO2: 19 meq/L (ref 19–32)
Calcium: 8.5 mg/dL (ref 8.4–10.5)
Creatinine, Ser: 1.53 mg/dL — ABNORMAL HIGH (ref 0.50–1.35)
GFR calc Af Amer: 60 mL/min — ABNORMAL LOW (ref >90)
GFR calc non Af Amer: 51 mL/min — ABNORMAL LOW (ref >90)
GLUCOSE: 169 mg/dL — AB (ref 70–99)
POTASSIUM: 4.7 meq/L (ref 3.7–5.3)
SODIUM: 139 meq/L (ref 137–147)

## 2014-03-03 LAB — CBC
HEMATOCRIT: 29.9 % — AB (ref 39.0–52.0)
HEMOGLOBIN: 10.3 g/dL — AB (ref 13.0–17.0)
MCH: 27.5 pg (ref 26.0–34.0)
MCHC: 34.4 g/dL (ref 30.0–36.0)
MCV: 79.9 fL (ref 78.0–100.0)
Platelets: 266 10*3/uL (ref 150–400)
RBC: 3.74 MIL/uL — AB (ref 4.22–5.81)
RDW: 13 % (ref 11.5–15.5)
WBC: 11.5 10*3/uL — ABNORMAL HIGH (ref 4.0–10.5)

## 2014-03-03 LAB — MRSA PCR SCREENING: MRSA BY PCR: NEGATIVE

## 2014-03-03 NOTE — Progress Notes (Signed)
Nutrition Brief Note  Patient identified on the Malnutrition Screening Tool (MST) Report for unintentional weight loss. Patient with some weight loss, but remains obese. PO intake is good. Appetite improving.  Wt Readings from Last 15 Encounters:  03/02/14 303 lb 9.2 oz (137.7 kg)  02/08/14 321 lb (145.605 kg)  12/22/13 320 lb 8 oz (145.378 kg)  11/19/13 323 lb (146.512 kg)  09/23/13 325 lb (147.419 kg)  09/13/13 332 lb (150.594 kg)  09/09/13 331 lb (150.141 kg)  09/02/13 343 lb (155.584 kg)  08/26/13 335 lb 1.6 oz (152 kg)  08/09/13 335 lb (151.955 kg)  04/14/13 335 lb (151.955 kg)  04/09/13 338 lb (153.316 kg)  11/23/12 315 lb (142.883 kg)  11/06/12 314 lb (142.429 kg)  02/05/12 330 lb (149.687 kg)    Body mass index is 34.19 kg/(m^2). Patient meets criteria for class one obesity, based on current BMI.   Current diet order is heart healthy, patient is consuming approximately 50% of meals at this time. Labs and medications reviewed.   No nutrition interventions warranted at this time. If nutrition issues arise, please consult RD.    Molli Barrows, RD, LDN, [place] Pager [phone] After Hours Pager [phone]

## 2014-03-03 NOTE — Progress Notes (Signed)
Inpatient Diabetes Program Recommendations  AACE/ADA: New Consensus Statement on Inpatient Glycemic Control (2013)  Target Ranges:  Prepandial:   less than 140 mg/dL      Peak postprandial:   less than 180 mg/dL (1-2 hours)      Critically ill patients:  140 - 180 mg/dL   Reason for assessment: hx. diabetes  Diabetes history: type 2  Outpatient Diabetes medications: Glucotrol 10mg  bid, Glucophage 1000mg  bid Current orders for Inpatient glycemic control: Novolog correction 0-9 units tid with meals  Please add carb modified to current heart healthy diet.  Gentry Fitz, RN, BA, MHA, CDE Diabetes Coordinator Inpatient Diabetes Program  [phone] (Team Pager) [phone] [facility]) 03/03/2014 8:45 AM

## 2014-03-03 NOTE — Progress Notes (Signed)
Utilization review completed.  

## 2014-03-03 NOTE — Progress Notes (Signed)
Family Medicine Teaching Service Daily Progress Note Intern Pager: [phone]  Patient name: Ray Hall Medical record number: [id number] Date of birth: December 21, 1960 Age: 53 y.o. Gender: male  Primary Care Provider: Tawanna Sat, MD Consultants: None Code Status: Full  Pt Overview and Major Events to Date:  11/4: [facility].  Assessment and Plan: Ray Hall is a 53 y.o. male presenting with Sepsis most likely 2/2 urinary tract infection . PMH is significant for DM II, HTN, possible CHF, afib s/p ablation, BPH, and neurogenic bladder following bladder contusion.  #Severe Sepsis, 2/2 most likely UTI - Has responded well to fluids, UA w/ microscopy looks extremely infected. CXR non revealing and do not appreciate any open ulcerations for secondary causes. Lactic Acid has trended down after fluids and BP has responded nicely - Admitted to SDU, vitals per unit  - Vancoymycin and Zosyn (11/4>>) - UA + for LE/Nitrites .- F/U BCx and [facility] about continual hematuria, recurrent UTI's, and concern for fistula.  - Cystoscopy done on 9/15 was completely normal. Instructed to follow-up for discussion concerning self-catheterization  - No further imaging recommended at this time  - Will follow recommmendations  #Presyncope/Syncope - most likely orthostatic as trying to arise, especially with decreased PO intake and infection.  - Troponin negative - EKG- no changes since last reading - Telemetry overnight   #HTN - BP 126/76 this morning - Hold home HTN medications in setting of sepsis - Restart as clinical status improves  #AKI - Baseline creatinine 0.72-0.9. Creatinine 2.08 on admission. - Trending down to 1.53 this morning - Continue with fluids   #Hyperkalemia - Trending down to 4.7 today - Most likely 2/2 renal failure/acidemia, repeat in AM   #AG metabolic acidosis - most likely 2/2 to lactic  acidosis - AG of 15 this morning, down from 23 today - Repeat in AM, continue with fluids   FEN/GI: NS @ 100 cc/hr Prophylaxis: SQ Heparin in setting of renal failure  Disposition: Admitted to [facility] Inpatient Service  Subjective:  No acute complaints overnight. States he is feeling much better today and was asking when he could go home. Urine has cleared and has become yellow. Denies abdominal pain or fevers. Denies shortness of breath. No further complaints today.  Objective: Temp:  [92.2 F (33.4 C)-99 F (37.2 C)] 98.2 F (36.8 C) (11/05 0405) Pulse Rate:  [91-110] 95 (11/05 0006) Resp:  [10-28] 10 (11/05 0405) BP: (67-163)/(49-87) 126/76 mmHg (11/05 0405) SpO2:  [89 %-99 %] 99 % (11/05 0420) Weight:  [303 lb 9.2 oz (137.7 kg)-320 lb (145.151 kg)] 303 lb 9.2 oz (137.7 kg) (11/04 2000) Physical Exam: General: 53yo male resting comfortably and in no apparent distress Cardiovascular: S1 and S2 noted, no murmurs/rubs/gallops Respiratory: Clear to auscultation bilaterally. No wheezing/rales/rhonchi. No increased work of breathing. Abdomen: Non-distended. Soft. Non-tender in abdomen, but states pain is referred to catheter.  Extremities: No edema noted.  Laboratory:  Recent Labs Lab 02/27/14 2246 03/02/14 1310 03/03/14 0303  WBC 11.8* 10.8* 11.5*  HGB 11.3* 11.4* 10.3*  HCT 32.6* 33.0* 29.9*  PLT 258 233 266    Recent Labs Lab 02/27/14 2246 03/02/14 1310 03/03/14 0303  NA 135* 136* 139  K 5.2 5.4* 4.7  CL 97 100 105  CO2 19 13* 19  BUN 24* 32* 30*  CREATININE 1.57* 2.08* 1.53*  CALCIUM 9.2 9.1 8.5  PROT  --  6.8  --  BILITOT  --  0.3  --   ALKPHOS  --  75  --   ALT  --  20  --   AST  --  11  --   GLUCOSE 236* 347* 169*   Urinalysis    Component Value Date/Time   COLORURINE RED* 03/02/2014 1224   APPEARANCEUR TURBID* 03/02/2014 1224   LABSPEC 1.022 03/02/2014 1224   PHURINE 6.0 03/02/2014 1224   GLUCOSEU 100* 03/02/2014 1224   HGBUR LARGE*  03/02/2014 1224   HGBUR negative 04/16/2010 1058   BILIRUBINUR LARGE* 03/02/2014 1224   BILIRUBINUR NEG 02/08/2014 1444   KETONESUR 15* 03/02/2014 1224   PROTEINUR >300* 03/02/2014 1224   PROTEINUR NEG 02/08/2014 1444   UROBILINOGEN 1.0 03/02/2014 1224   UROBILINOGEN 1.0 02/08/2014 1444   NITRITE POSITIVE* 03/02/2014 1224   NITRITE NEG 02/08/2014 1444   LEUKOCYTESUR LARGE* 03/02/2014 1224   Imaging/Diagnostic Tests: Dg Chest Port 1 View  03/02/2014   CLINICAL DATA:  Hematuria, near syncope, and back pain with generalize weakness; history of diabetes and hypertension  EXAM: PORTABLE CHEST - 1 VIEW  COMPARISON:  PA and lateral chest of January 18, 2014  FINDINGS: The lungs are hypoinflated. There is no focal infiltrate. There is some crowding of the pulmonary interstitial markings. The cardiac silhouette is normal in size. The pulmonary vascularity is not engorged. The mediastinum is normal in width. The observed bony thorax exhibits no acute abnormalities.  IMPRESSION: The study is limited due to hypoinflation. There is no acute cardiopulmonary abnormality.   Electronically Signed   By: David  Martinique   On: 03/02/2014 12:49   Lorna Few, DO 03/03/2014, 8:09 AM PGY-1, [place] Intern pager: [phone], text pages welcome

## 2014-03-04 LAB — GLUCOSE, CAPILLARY
GLUCOSE-CAPILLARY: 166 mg/dL — AB (ref 70–99)
Glucose-Capillary: 194 mg/dL — ABNORMAL HIGH (ref 70–99)
Glucose-Capillary: 194 mg/dL — ABNORMAL HIGH (ref 70–99)
Glucose-Capillary: 247 mg/dL — ABNORMAL HIGH (ref 70–99)
Glucose-Capillary: 248 mg/dL — ABNORMAL HIGH (ref 70–99)
Glucose-Capillary: 254 mg/dL — ABNORMAL HIGH (ref 70–99)

## 2014-03-04 LAB — CBC
HCT: 26.9 % — ABNORMAL LOW (ref 39.0–52.0)
HEMOGLOBIN: 9.3 g/dL — AB (ref 13.0–17.0)
MCH: 27.6 pg (ref 26.0–34.0)
MCHC: 34.6 g/dL (ref 30.0–36.0)
MCV: 79.8 fL (ref 78.0–100.0)
Platelets: 281 10*3/uL (ref 150–400)
RBC: 3.37 MIL/uL — ABNORMAL LOW (ref 4.22–5.81)
RDW: 13 % (ref 11.5–15.5)
WBC: 6.5 10*3/uL (ref 4.0–10.5)

## 2014-03-04 LAB — BASIC METABOLIC PANEL
Anion gap: 14 (ref 5–15)
BUN: 16 mg/dL (ref 6–23)
CALCIUM: 8.6 mg/dL (ref 8.4–10.5)
CO2: 19 mEq/L (ref 19–32)
Chloride: 102 mEq/L (ref 96–112)
Creatinine, Ser: 1.04 mg/dL (ref 0.50–1.35)
GFR calc Af Amer: >90 mL/min (ref >90)
GFR calc non Af Amer: 82 mL/min — ABNORMAL LOW (ref >90)
GLUCOSE: 144 mg/dL — AB (ref 70–99)
Potassium: 4.6 mEq/L (ref 3.7–5.3)
Sodium: 135 mEq/L — ABNORMAL LOW (ref 137–147)

## 2014-03-04 MED ORDER — CIPROFLOXACIN HCL 250 MG PO TABS
250.0000 mg | ORAL_TABLET | Freq: Two times a day (BID) | ORAL | Status: DC
  Administered 2014-03-04 – 2014-03-05 (×3): 250 mg via ORAL
  Filled 2014-03-04 (×5): qty 1

## 2014-03-04 MED ORDER — INSULIN ASPART 100 UNIT/ML ~~LOC~~ SOLN
0.0000 [IU] | Freq: Every day | SUBCUTANEOUS | Status: DC
  Administered 2014-03-05: 3 [IU] via SUBCUTANEOUS

## 2014-03-04 MED ORDER — INSULIN ASPART 100 UNIT/ML ~~LOC~~ SOLN
0.0000 [IU] | Freq: Three times a day (TID) | SUBCUTANEOUS | Status: DC
  Administered 2014-03-05 (×2): 3 [IU] via SUBCUTANEOUS

## 2014-03-04 NOTE — Progress Notes (Signed)
Attempted to call report to 5w at [facility], receiving nurse unable to take at time and stated she would call back in ~38minutes

## 2014-03-04 NOTE — Progress Notes (Signed)
Patient bedtime CBG 254. No coverage ordered at this time. Notified provider on call. Dr. Sherril Cong placed orders. Will continue to monitor.

## 2014-03-04 NOTE — Progress Notes (Signed)
Report called to 5W 
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
## 2014-03-04 NOTE — Progress Notes (Signed)
Family Medicine Teaching Service Daily Progress Note Intern Pager: [phone]  Patient name: Ray Hall Medical record number: [id number] Date of birth: Dec 03, 1960 Age: 53 y.o. Gender: male  Primary Care Provider: Tawanna Sat, MD Consultants: None Code Status: Full  Pt Overview and Major Events to Date:  11/4: [facility].  Assessment and Plan: Ray Hall is a 53 y.o. male presenting with Sepsis most likely 2/2 urinary tract infection . PMH is significant for DM II, HTN, possible CHF, afib s/p ablation, BPH, and neurogenic bladder following bladder contusion.  #Severe Sepsis, 2/2 most likely UTI - Has responded well to fluids, UA w/ microscopy looks extremely infected. CXR non revealing and do not appreciate any open ulcerations for secondary causes. Lactic Acid has trended down after fluids and BP has responded nicely - Urine Cx- grew E. Coli  At >100,000 - Blood Cx- no growth to date - Vancoymycin  (11/4>>11/6) and Zosyn (11/4>>)--will narrow antibiotics today  - Day #3/10 of abx - UA + for LE/Nitrites - [facility] about continual hematuria, recurrent UTI's, and concern for fistula.  - Cystoscopy done on 9/15 was completely normal. Instructed to follow-up for discussion concerning self-catheterization  - No further imaging recommended at this time  - Neurogenic bladder-->urinary retention-->recurrent UTIs   - Will teach self-catherization during hospitalization  - Will follow recommmendations - Transfer out of SDU today  #HTN - BP 115/69 this morning - Hold home HTN medications in setting of sepsis - Restart as clinical status improves  #AKI - Baseline creatinine 0.72-0.9. Creatinine 2.08 on admission. - Cr- 1.53 - Continue with fluids   FEN/GI: NS @ 100 cc/hr Prophylaxis: SQ Heparin in setting of renal failure  Disposition: Admitted to [facility] Medicine Inpatient Service  Subjective:  No  acute complaints overnight. States he would like to go home today and is feeling much better. Is willing to learn self catherizatio if it will prevent further UTIs. No further complaints today.  Objective: Temp:  [97.6 F (36.4 C)-98.3 F (36.8 C)] 98.3 F (36.8 C) (11/06 0745) Pulse Rate:  [81-97] 81 (11/06 0745) Resp:  [10-17] 12 (11/06 0745) BP: (115-128)/(69-87) 115/69 mmHg (11/06 0745) SpO2:  [96 %-99 %] 97 % (11/06 0745) Physical Exam: General: 53yo male resting comfortably and in no apparent distress Cardiovascular: S1 and S2 noted, no murmurs/rubs/gallops Respiratory: Clear to auscultation bilaterally. No wheezing/rales/rhonchi. No increased work of breathing. Abdomen: Non-distended. Soft. Non-tender in abdomen, but states pain is referred to catheter.  Extremities: No edema noted.  Laboratory:  Recent Labs Lab 02/27/14 2246 03/02/14 1310 03/03/14 0303  WBC 11.8* 10.8* 11.5*  HGB 11.3* 11.4* 10.3*  HCT 32.6* 33.0* 29.9*  PLT 258 233 266    Recent Labs Lab 02/27/14 2246 03/02/14 1310 03/03/14 0303  NA 135* 136* 139  K 5.2 5.4* 4.7  CL 97 100 105  CO2 19 13* 19  BUN 24* 32* 30*  CREATININE 1.57* 2.08* 1.53*  CALCIUM 9.2 9.1 8.5  PROT  --  6.8  --   BILITOT  --  0.3  --   ALKPHOS  --  75  --   ALT  --  20  --   AST  --  11  --   GLUCOSE 236* 347* 169*   Urinalysis    Component Value Date/Time   COLORURINE RED* 03/02/2014 1224   APPEARANCEUR TURBID* 03/02/2014 1224   LABSPEC 1.022 03/02/2014 1224   PHURINE 6.0 03/02/2014  [place] 03/02/2014 1224   HGBUR LARGE* 03/02/2014 1224   HGBUR negative 04/16/2010 1058   BILIRUBINUR LARGE* 03/02/2014 1224   BILIRUBINUR NEG 02/08/2014 1444   KETONESUR 15* 03/02/2014 1224   PROTEINUR >300* 03/02/2014 1224   PROTEINUR NEG 02/08/2014 1444   UROBILINOGEN 1.0 03/02/2014 1224   UROBILINOGEN 1.0 02/08/2014 1444   NITRITE POSITIVE* 03/02/2014 1224   NITRITE NEG 02/08/2014 1444   LEUKOCYTESUR LARGE*  03/02/2014 1224   Imaging/Diagnostic Tests: Dg Chest Port 1 View  03/02/2014   CLINICAL DATA:  Hematuria, near syncope, and back pain with generalize weakness; history of diabetes and hypertension  EXAM: PORTABLE CHEST - 1 VIEW  COMPARISON:  PA and lateral chest of January 18, 2014  FINDINGS: The lungs are hypoinflated. There is no focal infiltrate. There is some crowding of the pulmonary interstitial markings. The cardiac silhouette is normal in size. The pulmonary vascularity is not engorged. The mediastinum is normal in width. The observed bony thorax exhibits no acute abnormalities.  IMPRESSION: The study is limited due to hypoinflation. There is no acute cardiopulmonary abnormality.   Electronically Signed   By: David  Martinique   On: 03/02/2014 12:49   Lorna Few, DO 03/04/2014, 8:16 AM PGY-1, [place] Intern pager: [phone], text pages welcome

## 2014-03-04 NOTE — Progress Notes (Signed)
Patient refused self cath teaching at this time. States "i want to wait til the morning." Pt has voided in urinal 200cc amber colored urine. Pt states "i feel that's all i need to get out now." Will continue to monitor.

## 2014-03-05 DIAGNOSIS — A4151 Sepsis due to Escherichia coli [E. coli]: Secondary | ICD-10-CM

## 2014-03-05 LAB — GLUCOSE, CAPILLARY
GLUCOSE-CAPILLARY: 246 mg/dL — AB (ref 70–99)
GLUCOSE-CAPILLARY: 232 mg/dL — AB (ref 70–99)

## 2014-03-05 LAB — URINE CULTURE: Colony Count: >=100000

## 2014-03-05 LAB — CBC
HEMATOCRIT: 26.9 % — AB (ref 39.0–52.0)
Hemoglobin: 9.3 g/dL — ABNORMAL LOW (ref 13.0–17.0)
MCH: 27.1 pg (ref 26.0–34.0)
MCHC: 34.6 g/dL (ref 30.0–36.0)
MCV: 78.4 fL (ref 78.0–100.0)
Platelets: 305 10*3/uL (ref 150–400)
RBC: 3.43 MIL/uL — ABNORMAL LOW (ref 4.22–5.81)
RDW: 12.9 % (ref 11.5–15.5)
WBC: 7.2 10*3/uL (ref 4.0–10.5)

## 2014-03-05 MED ORDER — HYDROCODONE-ACETAMINOPHEN 5-325 MG PO TABS: 1.0000 | ORAL_TABLET | Freq: Four times a day (QID) | ORAL | Status: DC | PRN

## 2014-03-05 MED ORDER — CIPROFLOXACIN HCL 250 MG PO TABS: 250.0000 mg | ORAL_TABLET | Freq: Two times a day (BID) | ORAL | Status: DC

## 2014-03-05 NOTE — Discharge Instructions (Signed)
Please do not take your lisinopril, lasix, or diltiazem until you follow up with Dr. Lamar Benes in the [place]. Your blood pressures ran a little low here, and we need to be sure they come back up before restarting these medicines.  Follow up with Dr. Lamar Benes & [facility] (see appointment times below).  You were admitted to the hospital with a urinary tract infection  Please seek medical attention if you experience any chest pain, shortness of breath, fevers, difficulty urinating, back pain, or any other medical concerns.

## 2014-03-05 NOTE — Discharge Summary (Signed)
[facility] Discharge Summary  Patient name: Ray Hall Medical record number: [id number] Date of birth: January 20, 1961 Age: 53 y.o. Gender: male Date of Admission: 03/02/2014  Date of Discharge: 03/05/14 Admitting Physician: Dickie La, MD  Primary Care Provider: Tawanna Sat, MD Consultants: None  Indication for Hospitalization: Sepsis secondary to Urinary Tract Infection  Discharge Diagnoses/Problem List:  Severe Sepsis secondary to UTI Type II Diabetes Acute Kidney Injury Hyperkalemia Hypertension  Disposition: Discharge home  Discharge Condition: Stable  Brief Hospital Course:  Ray Hall originally presented to [facility] on 03/02/14 and was diagnosed with severe sepsis secondary to UTI. He responded well to fluids and was transferred to [facility]. CXR showed no acute cardiopulmonary abnormality. Lactic Acid was 5.44 initially, but had trended down to 2.23 prior to transfer. WBC was 11.5 at admission, but trended down to 7.2. Urinalysis showed leukocyte esterase and nitrites. He was initiated on Vancomycin and Zosyn on 03/02/14. Blood cultures showed no growth. Urine cultures grew E. coli. Antibiotics were narrowed on 03/04/14, so vancomycin and zosyn were discontinued and oral Ciprofloxacin was initiated.  Hypertensive medications held throughout hospitalization and at discharge. Creatinine was 2.08 at admission, but trended down to 1.04 prior to discharge. Potassium was 5.3 at admission, but trended down to 4.7 prior to discharge. Troponins were negative and EKG showed no acute changes. Care was discussed with Urology who did not recommend any other imaging or studies during hospitalization. Ray Hall initially agreed to learning self-catheterization prior to discharge to prevent further hospitalizations from complications of urinary retention, however he then decided to discuss self-cathererization with his urologist as an outpatient. He  was discharged on 03/05/14 following clinical improvement and transition to oral antibiotics.  Issues for Follow Up:  - UTI suspected to be due to urinary retention secondary to neurogenic bladder. Denied learning self-catheterization and said he would discuss at his urology follow-up. - Discharged on day #4 of 10 day course of antibiotics. Discharge with Ciprofloxacin 250mg .   Significant Procedures: None  Significant Labs and Imaging:   Recent Labs Lab 03/03/14 0303 03/04/14 0900 03/05/14 0316  WBC 11.5* 6.5 7.2  HGB 10.3* 9.3* 9.3*  HCT 29.9* 26.9* 26.9*  PLT 266 281 305    Recent Labs Lab 02/27/14 2246 03/02/14 1310 03/03/14 0303 03/04/14 0900  NA 135* 136* 139 135*  K 5.2 5.4* 4.7 4.6  CL 97 100 105 102  CO2 19 13* 19 19  GLUCOSE 236* 347* 169* 144*  BUN 24* 32* 30* 16  CREATININE 1.57* 2.08* 1.53* 1.04  CALCIUM 9.2 9.1 8.5 8.6  ALKPHOS  --  75  --   --   AST  --  11  --   --   ALT  --  20  --   --   ALBUMIN  --  2.9*  --   --    Urinalysis    Component Value Date/Time   COLORURINE RED* 03/02/2014 1224   APPEARANCEUR TURBID* 03/02/2014 1224   LABSPEC 1.022 03/02/2014 1224   PHURINE 6.0 03/02/2014 1224   GLUCOSEU 100* 03/02/2014 1224   HGBUR LARGE* 03/02/2014 1224   HGBUR negative 04/16/2010 1058   BILIRUBINUR LARGE* 03/02/2014 1224   BILIRUBINUR NEG 02/08/2014 1444   KETONESUR 15* 03/02/2014 1224   PROTEINUR >300* 03/02/2014 1224   PROTEINUR NEG 02/08/2014 1444   UROBILINOGEN 1.0 03/02/2014 1224   UROBILINOGEN 1.0 02/08/2014 1444   NITRITE POSITIVE* 03/02/2014 1224   NITRITE NEG 02/08/2014  [place] 03/02/2014 1224   Dg Chest Port 1 View  03/02/2014   CLINICAL DATA:  Hematuria, near syncope, and back pain with generalize weakness; history of diabetes and hypertension  EXAM: PORTABLE CHEST - 1 VIEW  COMPARISON:  PA and lateral chest of January 18, 2014  FINDINGS: The lungs are hypoinflated. There is no focal infiltrate. There is some  crowding of the pulmonary interstitial markings. The cardiac silhouette is normal in size. The pulmonary vascularity is not engorged. The mediastinum is normal in width. The observed bony thorax exhibits no acute abnormalities.  IMPRESSION: The study is limited due to hypoinflation. There is no acute cardiopulmonary abnormality.   Electronically Signed   By: David  Martinique   On: 03/02/2014 12:49   Results/Tests Pending at Time of Discharge: Final Blood Culture Report  Discharge Medications:    Medication List    STOP taking these medications        cephALEXin 500 MG capsule  Commonly known as:  KEFLEX     diltiazem 60 MG tablet  Commonly known as:  CARDIZEM     furosemide 40 MG tablet  Commonly known as:  LASIX     lisinopril 20 MG tablet  Commonly known as:  PRINIVIL,ZESTRIL     predniSONE 10 MG tablet  Commonly known as:  DELTASONE      TAKE these medications        BC FAST PAIN RELIEF ARTHRITIS 1000-65 MG Pack  Generic drug:  Aspirin-Caffeine  Take 1 tablet by mouth once.     ciprofloxacin 250 MG tablet  Commonly known as:  CIPRO  Take 1 tablet (250 mg total) by mouth 2 (two) times daily.     cyclobenzaprine 10 MG tablet  Commonly known as:  FLEXERIL  Take 10 mg by mouth 3 (three) times daily as needed for muscle spasms.     diazepam 5 MG tablet  Commonly known as:  VALIUM  Take 5 mg by mouth 2 (two) times daily.     gabapentin 100 MG capsule  Commonly known as:  NEURONTIN  Take 100 mg by mouth 3 (three) times daily.     glipiZIDE 10 MG tablet  Commonly known as:  GLUCOTROL  Take 10 mg by mouth 2 (two) times daily before a meal.     HYDROcodone-acetaminophen 5-325 MG per tablet  Commonly known as:  NORCO/VICODIN  Take 1-2 tablets by mouth every 6 (six) hours as needed for moderate pain or severe pain.     HYDROcodone-acetaminophen 5-325 MG per tablet  Commonly known as:  NORCO/VICODIN  Take 1-2 tablets by mouth every 6 (six) hours as needed for moderate  pain or severe pain.     metFORMIN 1000 MG tablet  Commonly known as:  GLUCOPHAGE  Take 1,000 mg by mouth 2 (two) times daily with a meal.     oxyCODONE-acetaminophen 5-325 MG per tablet  Commonly known as:  PERCOCET  Take 1 tablet by mouth every 6 (six) hours as needed.        Discharge Instructions: Please refer to Patient Instructions section of EMR for full details.  Patient was counseled important signs and symptoms that should prompt return to medical care, changes in medications, dietary instructions, activity restrictions, and follow up appointments.   Follow-Up Appointments:     Follow-up Information    Follow up with [facility] On 03/15/2014.   Why:  11:30 am followup appointment with nurse practitioner   Contact information:  [place] [phone]      Follow up with Tawanna Sat, MD On 03/09/2014.   Specialty:  Family Medicine   Why:  at 2:15pm   Contact information:   [place] [address] [phone]      [place] [address] [place], [place] 03/05/2014, 6:22 PM PGY-1, [place]

## 2014-03-05 NOTE — Progress Notes (Signed)
Patient refused to be escorted to main enterance

## 2014-03-05 NOTE — Progress Notes (Signed)
Family Medicine Teaching Service Daily Progress Note Intern Pager: [phone]  Patient name: Ray Hall Medical record number: [id number] Date of birth: January 02, 1961 Age: 53 y.o. Gender: male  Primary Care Provider: Tawanna Sat, MD Consultants: urology Code Status: Full  Pt Overview and Major Events to Date:  11/4: Presented to [facility]. [facility]red to [facility] for [facility].  Assessment and Plan: Ray Hall is a 53 y.o. male presenting with Sepsis most likely 2/2 urinary tract infection . PMH is significant for DM II, HTN, possible CHF, afib s/p ablation, BPH, and neurogenic bladder following bladder contusion.  #Severe Sepsis, 2/2 most likely UTI - Has responded well to fluids, UA w/ microscopy looks extremely infected. CXR non revealing and do not appreciate any open ulcerations for secondary causes. Lactic Acid has trended down after fluids and BP has responded nicely - Urine Cx- grew E. Coli  At >100,000, pan-sensitive - Blood Cx- no growth to date x2 - Vancoymycin  (11/4>>11/6) and Zosyn (11/4>>116)-- now on PO cipro 250mg  BID (day # 4 of 10 total of antibiotics) - [facility] about continual hematuria, recurrent UTI's, and concern for fistula.  - C[place]one on 9/15 was completely normal. Instructed to follow-up for discussion concerning self-catheterization  - No further imaging recommended at this time  - Neurogenic bladder-->urinary retention-->recurrent UTIs -Patient refusing self caths at this time. He would like to f/u with Dr. Gaynelle Arabian as an outpatient. States he is urinating fine now. UOP 2.5L yesterday. Stable for d/c home with antibiotics with low threshold for return to ER if any issues with passing urine or new infectious symptoms.  #HTN - normotensive currently, held home HTN medications (dilt, lasix, lisinopril) in setting of sepsis - as still normotensive, will wait until hosp f/u appt to resume BP meds  #AKI - Baseline creatinine  0.72-0.9. Creatinine 2.08 on admission. - Cr has downtrended nicely. Saline lock IV today.  # Anemia: Hgb 9.3 (baseline appears 11-12) -possibly in setting of IV hydration, f/u Hgb at office visit after d/c -consider iron studies as outpatient if Hgb remains low  # T2DM: on metormin & glipizide as outpatient - SSI while inpatient  # Chronic back pain: prn norco  FEN/GI: SLIV today Prophylaxis: SQ Heparin  Disposition: d/c home today. Needs close PCP f/u and outpatient urology f/u.  Subjective:  No complaints today. Adamantly refusing to self-cath at this time. Refuses teaching as well. Wants to just f/u with Dr. Gaynelle Arabian as an outpt for self-cath discussion. We had a long discussion about risks and benefits of going home w/o caths and he accepts and states he will return if he has any problems urinating.  Objective: Temp:  [97.8 F (36.6 C)-98.6 F (37 C)] 98.6 F (37 C) (11/07 0627) Pulse Rate:  [81-95] 85 (11/07 0627) Resp:  [16-18] 17 (11/07 0627) BP: (107-134)/(66-85) 134/79 mmHg (11/07 0627) SpO2:  [98 %-100 %] 100 % (11/07 1478) Physical Exam: General: adult male ambulating comfortably around the room Cardiovascular: S1 and S2 noted, no murmurs/rubs/gallops Respiratory: Clear to auscultation bilaterally. No wheezing/rales/rhonchi. No increased work of breathing. Abdomen: Non-distended. Soft. Non-tender in abdomen,  Back: no CVA tenderness bilat Extremities: No edema noted.  Laboratory:  Recent Labs Lab 03/03/14 0303 03/04/14 0900 03/05/14 0316  WBC 11.5* 6.5 7.2  HGB 10.3* 9.3* 9.3*  HCT 29.9* 26.9* 26.9*  PLT 266 281 305    Recent Labs Lab 03/02/14 1310 03/03/14 0303 03/04/14 0900  NA 136* 139 135*  K 5.4* 4.7 4.6  CL 100 105 102  CO2 13* 19 19  BUN 32* 30* 16  CREATININE 2.08* 1.53* 1.04  CALCIUM 9.1 8.5 8.6  PROT 6.8  --   --   BILITOT 0.3  --   --   ALKPHOS 75  --   --   ALT 20  --   --   AST 11  --   --   GLUCOSE 347* 169* 144*    Urinalysis    Component Value Date/Time   COLORURINE RED* 03/02/2014 1224   APPEARANCEUR TURBID* 03/02/2014 1224   LABSPEC 1.022 03/02/2014 1224   PHURINE 6.0 03/02/2014 1224   GLUCOSEU 100* 03/02/2014 1224   HGBUR LARGE* 03/02/2014 1224   HGBUR negative 04/16/2010 1058   BILIRUBINUR LARGE* 03/02/2014 1224   BILIRUBINUR NEG 02/08/2014 1444   KETONESUR 15* 03/02/2014 1224   PROTEINUR >300* 03/02/2014 1224   PROTEINUR NEG 02/08/2014 1444   UROBILINOGEN 1.0 03/02/2014 1224   UROBILINOGEN 1.0 02/08/2014 1444   NITRITE POSITIVE* 03/02/2014 1224   NITRITE NEG 02/08/2014 1444   LEUKOCYTESUR LARGE* 03/02/2014 1224   Imaging/Diagnostic Tests: Dg Chest Port 1 View  03/02/2014   CLINICAL DATA:  Hematuria, near syncope, and back pain with generalize weakness; history of diabetes and hypertension  EXAM: PORTABLE CHEST - 1 VIEW  COMPARISON:  PA and lateral chest of January 18, 2014  FINDINGS: The lungs are hypoinflated. There is no focal infiltrate. There is some crowding of the pulmonary interstitial markings. The cardiac silhouette is normal in size. The pulmonary vascularity is not engorged. The mediastinum is normal in width. The observed bony thorax exhibits no acute abnormalities.  IMPRESSION: The study is limited due to hypoinflation. There is no acute cardiopulmonary abnormality.   Electronically Signed   By: David  Martinique   On: 03/02/2014 12:49   Leeanne Rio, MD 03/05/2014, 9:42 AM PGY-3, [place] Intern pager: [phone], text pages welcome

## 2014-03-08 LAB — CULTURE, BLOOD (ROUTINE X 2)
CULTURE: NO GROWTH
Culture: NO GROWTH

## 2014-03-09 ENCOUNTER — Ambulatory Visit (INDEPENDENT_AMBULATORY_CARE_PROVIDER_SITE_OTHER): Payer: BC Managed Care – PPO | Admitting: Family Medicine

## 2014-03-09 ENCOUNTER — Encounter: Payer: Self-pay | Admitting: Family Medicine

## 2014-03-09 VITALS — BP 143/88 | HR 112 | Temp 98.1°F | Ht 79.0 in | Wt 312.0 lb

## 2014-03-09 DIAGNOSIS — N390 Urinary tract infection, site not specified: Secondary | ICD-10-CM

## 2014-03-09 DIAGNOSIS — M25512 Pain in left shoulder: Secondary | ICD-10-CM

## 2014-03-09 DIAGNOSIS — R319 Hematuria, unspecified: Secondary | ICD-10-CM

## 2014-03-09 DIAGNOSIS — E119 Type 2 diabetes mellitus without complications: Secondary | ICD-10-CM

## 2014-03-09 DIAGNOSIS — M545 Low back pain, unspecified: Secondary | ICD-10-CM

## 2014-03-09 LAB — POCT GLYCOSYLATED HEMOGLOBIN (HGB A1C): HEMOGLOBIN A1C: 8.6

## 2014-03-09 MED ORDER — OXYCODONE-ACETAMINOPHEN 5-325 MG PO TABS: 1.0000 | ORAL_TABLET | Freq: Four times a day (QID) | ORAL | Status: DC | PRN

## 2014-03-09 MED ORDER — METHOCARBAMOL 750 MG PO TABS: 1500.0000 mg | ORAL_TABLET | Freq: Four times a day (QID) | ORAL | Status: DC | PRN

## 2014-03-09 NOTE — Patient Instructions (Addendum)
For your back, try the Robaxin (muscle relaxer). Don't take this with the flexeril. It may make you sleepy so NO DRIVING after taking it.  Make sure to go to your urology appt next week. You most likely are still retaining urine, meaning when you pee you aren't getting all of the urine out.   Finish your antibiotics, if you develop fevers/chills please call the clinic to get further advice.

## 2014-03-09 NOTE — Assessment & Plan Note (Signed)
Acute exacerbation of chronic low back pain just before hospitalization. Reports vicodin and flexeril have not helped with the pain.  Plan: refill percocet #30 for acute exacerbation. Will try robaxin for muscle relaxer as not much relief with flexeril. Also advised heating pads. Printed and went over stretches for low back.
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
## 2014-03-23 ENCOUNTER — Telehealth: Payer: Self-pay | Admitting: *Deleted

## 2014-03-23 ENCOUNTER — Other Ambulatory Visit: Payer: Self-pay | Admitting: Family Medicine

## 2014-03-23 DIAGNOSIS — M25512 Pain in left shoulder: Secondary | ICD-10-CM

## 2014-03-23 DIAGNOSIS — E119 Type 2 diabetes mellitus without complications: Secondary | ICD-10-CM

## 2014-03-23 MED ORDER — GLIPIZIDE 10 MG PO TABS: 10.0000 mg | ORAL_TABLET | Freq: Two times a day (BID) | ORAL | Status: DC

## 2014-03-23 NOTE — Telephone Encounter (Signed)
Patient came into office requesting refill of glipizide.  Is out of med.  Was last seen by Dr. Lamar Benes on 03/09/14.  Will refill med to [facility].  Patient informed.  Burna Forts, BSN, RN-BC

## 2014-03-23 NOTE — Telephone Encounter (Signed)
Pt requesting refill on oxycodone for pulled muscle in back.

## 2014-04-06 ENCOUNTER — Ambulatory Visit: Payer: Self-pay | Admitting: Family Medicine

## 2014-04-30 ENCOUNTER — Encounter (HOSPITAL_COMMUNITY): Payer: Self-pay | Admitting: Emergency Medicine

## 2014-04-30 ENCOUNTER — Emergency Department (HOSPITAL_COMMUNITY)
Admission: EM | Admit: 2014-04-30 | Discharge: 2014-04-30 | Disposition: A | Discharge status: Home / Self Care | Payer: BC Managed Care – PPO | Attending: Emergency Medicine | Admitting: Emergency Medicine

## 2014-04-30 ENCOUNTER — Emergency Department (HOSPITAL_COMMUNITY): Payer: BC Managed Care – PPO

## 2014-04-30 DIAGNOSIS — I10 Essential (primary) hypertension: Secondary | ICD-10-CM | POA: Diagnosis not present

## 2014-04-30 DIAGNOSIS — Z7982 Long term (current) use of aspirin: Secondary | ICD-10-CM | POA: Diagnosis not present

## 2014-04-30 DIAGNOSIS — Z79899 Other long term (current) drug therapy: Secondary | ICD-10-CM | POA: Diagnosis not present

## 2014-04-30 DIAGNOSIS — M84471A Pathological fracture, right ankle, initial encounter for fracture: Secondary | ICD-10-CM | POA: Diagnosis not present

## 2014-04-30 DIAGNOSIS — S29012A Strain of muscle and tendon of back wall of thorax, initial encounter: Secondary | ICD-10-CM | POA: Diagnosis not present

## 2014-04-30 DIAGNOSIS — S62639A Displaced fracture of distal phalanx of unspecified finger, initial encounter for closed fracture: Secondary | ICD-10-CM

## 2014-04-30 DIAGNOSIS — E119 Type 2 diabetes mellitus without complications: Secondary | ICD-10-CM | POA: Insufficient documentation

## 2014-04-30 DIAGNOSIS — Z792 Long term (current) use of antibiotics: Secondary | ICD-10-CM | POA: Insufficient documentation

## 2014-04-30 DIAGNOSIS — T148XXA Other injury of unspecified body region, initial encounter: Secondary | ICD-10-CM

## 2014-04-30 DIAGNOSIS — M1710 Unilateral primary osteoarthritis, unspecified knee: Secondary | ICD-10-CM

## 2014-04-30 DIAGNOSIS — Z720 Tobacco use: Secondary | ICD-10-CM | POA: Insufficient documentation

## 2014-04-30 DIAGNOSIS — M179 Osteoarthritis of knee, unspecified: Secondary | ICD-10-CM | POA: Diagnosis not present

## 2014-04-30 DIAGNOSIS — Y998 Other external cause status: Secondary | ICD-10-CM | POA: Diagnosis not present

## 2014-04-30 DIAGNOSIS — R52 Pain, unspecified: Secondary | ICD-10-CM

## 2014-04-30 DIAGNOSIS — X58XXXA Exposure to other specified factors, initial encounter: Secondary | ICD-10-CM | POA: Insufficient documentation

## 2014-04-30 DIAGNOSIS — Y939 Activity, unspecified: Secondary | ICD-10-CM | POA: Insufficient documentation

## 2014-04-30 DIAGNOSIS — Z862 Personal history of diseases of the blood and blood-forming organs and certain disorders involving the immune mechanism: Secondary | ICD-10-CM | POA: Diagnosis not present

## 2014-04-30 DIAGNOSIS — Y929 Unspecified place or not applicable: Secondary | ICD-10-CM | POA: Insufficient documentation

## 2014-04-30 DIAGNOSIS — M25561 Pain in right knee: Secondary | ICD-10-CM | POA: Diagnosis present

## 2014-04-30 MED ORDER — OXYCODONE-ACETAMINOPHEN 5-325 MG PO TABS: 2.0000 | ORAL_TABLET | ORAL | Status: DC | PRN

## 2014-04-30 NOTE — ED Notes (Signed)
Patient with right knee and foot pain and back pain on the left.  Patient denies any injury to the areas.  Patient states that it is unbearable pain.  Patient denies any urinary problems.

## 2014-04-30 NOTE — Discharge Instructions (Signed)
Arthritis, Nonspecific °Arthritis is inflammation of a joint. This usually means pain, redness, warmth or swelling are present. One or more joints may be involved. There are a number of types of arthritis. Your caregiver may not be able to tell what type of arthritis you have right away. °CAUSES  °The most common cause of arthritis is the wear and tear on the joint (osteoarthritis). This causes damage to the cartilage, which can break down over time. The knees, hips, back and neck are most often affected by this type of arthritis. °Other types of arthritis and common causes of joint pain include: °· Sprains and other injuries near the joint. Sometimes minor sprains and injuries cause pain and swelling that develop hours later. °· Rheumatoid arthritis. This affects hands, feet and knees. It usually affects both sides of your body at the same time. It is often associated with chronic ailments, fever, weight loss and general weakness. °· Crystal arthritis. Gout and pseudo gout can cause occasional acute severe pain, redness and swelling in the foot, ankle, or knee. °· Infectious arthritis. Bacteria can get into a joint through a break in overlying skin. This can cause infection of the joint. Bacteria and viruses can also spread through the blood and affect your joints. °· Drug, infectious and allergy reactions. Sometimes joints can become mildly painful and slightly swollen with these types of illnesses. °SYMPTOMS  °· Pain is the main symptom. °· Your joint or joints can also be red, swollen and warm or hot to the touch. °· You may have a fever with certain types of arthritis, or even feel overall ill. °· The joint with arthritis will hurt with movement. Stiffness is present with some types of arthritis. °DIAGNOSIS  °Your caregiver will suspect arthritis based on your description of your symptoms and on your exam. Testing may be needed to find the type of arthritis: °· Blood and sometimes urine tests. °· X-ray tests  and sometimes CT or MRI scans. °· Removal of fluid from the joint (arthrocentesis) is done to check for bacteria, crystals or other causes. Your caregiver (or a specialist) will numb the area over the joint with a local anesthetic, and use a needle to remove joint fluid for examination. This procedure is only minimally uncomfortable. °· Even with these tests, your caregiver may not be able to tell what kind of arthritis you have. Consultation with a specialist (rheumatologist) may be helpful. °TREATMENT  °Your caregiver will discuss with you treatment specific to your type of arthritis. If the specific type cannot be determined, then the following general recommendations may apply. °Treatment of severe joint pain includes: °· Rest. °· Elevation. °· Anti-inflammatory medication (for example, ibuprofen) may be prescribed. Avoiding activities that cause increased pain. °· Only take over-the-counter or prescription medicines for pain and discomfort as recommended by your caregiver. °· Cold packs over an inflamed joint may be used for 10 to 15 minutes every hour. Hot packs sometimes feel better, but do not use overnight. Do not use hot packs if you are diabetic without your caregiver's permission. °· A cortisone shot into arthritic joints may help reduce pain and swelling. °· Any acute arthritis that gets worse over the next 1 to 2 days needs to be looked at to be sure there is no joint infection. °Long-term arthritis treatment involves modifying activities and lifestyle to reduce joint stress jarring. This can include weight loss. Also, exercise is needed to nourish the joint cartilage and remove waste. This helps keep the muscles   around the joint strong. °HOME CARE INSTRUCTIONS  °· Do not take aspirin to relieve pain if gout is suspected. This elevates uric acid levels. °· Only take over-the-counter or prescription medicines for pain, discomfort or fever as directed by your caregiver. °· Rest the joint as much as  possible. °· If your joint is swollen, keep it elevated. °· Use crutches if the painful joint is in your leg. °· Drinking plenty of fluids may help for certain types of arthritis. °· Follow your caregiver's dietary instructions. °· Try low-impact exercise such as: °¨ Swimming. °¨ Water aerobics. °¨ Biking. °¨ Walking. °· Morning stiffness is often relieved by a warm shower. °· Put your joints through regular range-of-motion. °SEEK MEDICAL CARE IF:  °· You do not feel better in 24 hours or are getting worse. °· You have side effects to medications, or are not getting better with treatment. °SEEK IMMEDIATE MEDICAL CARE IF:  °· You have a fever. °· You develop severe joint pain, swelling or redness. °· Many joints are involved and become painful and swollen. °· There is severe back pain and/or leg weakness. °· You have loss of bowel or bladder control. °Document Released: 05/23/2004 Document Revised: 07/08/2011 Document Reviewed: 06/08/2008 °Exit[employer]® Patient Information ©2015 [employer]. This information is not intended to replace advice given to you by your health care provider. Make sure you discuss any questions you have with your health care provider. ° °

## 2014-04-30 NOTE — ED Provider Notes (Signed)
CSN: [id number]     Arrival date & time 04/30/14  2010 History  This chart was scribed for Ray Docker, NP, working with Pamella Pert, MD by Steva Colder, ED Scribe. The patient was seen in room TR10C/TR10C at 8:34 PM.    Chief Complaint  Patient presents with  . Knee Pain  . Back Pain     The history is provided by the patient. No language interpreter was used.    HPI Comments: Ray Hall is a 54 y.o. male with a medical hx of DM who presents to the Emergency Department complaining of  Worsening right knee pain onset 2 days ago. He reports that he has tried to push through the pain. Denies any injury or fall. He states that he is having associated symptoms of right foot pain, left sided back pain, joint swelling, decreased sensation in right foot. He believes that his back pain is due to a spasm. He states that he has tried elevation with no relief for his symptoms. He denies urinary symptoms and any other symptoms. Denies medical hx of gout. He had reconstructive surgery on his left knee because he shattered his patella. He reports that he is under Urologist care and he will catherize TID.   Past Medical History  Diagnosis Date  . Sickle cell trait   . Normal nuclear stress test 07/2010    low prob of ischemia, EF 42%  . Echocardiogram abnormal     moderate LVH, mild LV hypokenesis, EF 42%  . History of Doppler ultrasound 07/2010    negative for DVT  . Abnormal CT of the abdomen     mild L hydronephrosis and hydroureter  . Status post radiofrequency ablation for arrhythmia 10/16/10    [facility] MD)  . Diabetes mellitus   . Hypertension    Past Surgical History  Procedure Laterality Date  . Radiofrequency ablation  10/16/10    Cardiac for atrial arrythmia, [facility]  . Dental extractions  10/15/10    prior to ablation   Family History  Problem Relation Age of Onset  . Sickle cell trait    . Heart failure Father   . Diabetes Father   . Diabetes Mother   .  Hypertension Mother    History  Substance Use Topics  . Smoking status: Current Every Day Smoker -- 1.50 packs/day for 17 years    Types: Cigars  . Smokeless tobacco: Never Used     Comment: 2 cigars  . Alcohol Use: No    Review of Systems  Genitourinary: Negative for dysuria, urgency and frequency.  Musculoskeletal: Positive for myalgias, back pain, joint swelling and arthralgias.  All other systems reviewed and are negative.     Allergies  Review of patient's allergies indicates no known allergies.  Home Medications   Prior to Admission medications   Medication Sig Start Date End Date Taking? Authorizing Provider  Aspirin-Caffeine (BC FAST PAIN RELIEF ARTHRITIS) 1000-65 MG PACK Take 1 tablet by mouth once.    Historical Provider, MD  ciprofloxacin (CIPRO) 250 MG tablet Take 1 tablet (250 mg total) by mouth 2 (two) times daily. 03/05/14   Leeanne Rio, MD  cyclobenzaprine (FLEXERIL) 10 MG tablet Take 10 mg by mouth 3 (three) times daily as needed for muscle spasms.    Historical Provider, MD  diazepam (VALIUM) 5 MG tablet Take 5 mg by mouth 2 (two) times daily.    Historical Provider, MD  gabapentin (NEURONTIN) 100 MG capsule Take 100  mg by mouth 3 (three) times daily.    Historical Provider, MD  glipiZIDE (GLUCOTROL) 10 MG tablet Take 1 tablet (10 mg total) by mouth 2 (two) times daily before a meal. 03/23/14   Andrena Mews, MD  metFORMIN (GLUCOPHAGE) 1000 MG tablet Take 1,000 mg by mouth 2 (two) times daily with a meal.    Historical Provider, MD  methocarbamol (ROBAXIN) 750 MG tablet Take 2 tablets (1,500 mg total) by mouth every 6 (six) hours as needed for muscle spasms. 03/09/14   Leone Brand, MD  oxyCODONE-acetaminophen (PERCOCET) 5-325 MG per tablet Take 1 tablet by mouth every 6 (six) hours as needed. 03/09/14   Leone Brand, MD   BP 148/99 mmHg  Pulse 113  Temp(Src) 97.8 F (36.6 C) (Oral)  Resp 18  Ht 6\' 7"  (2.007 m)  Wt 314 lb (142.429 kg)  BMI  35.36 kg/m2  SpO2 100%  Physical Exam  Constitutional: He is oriented to person, place, and time. He appears well-developed and well-nourished. No distress.  HENT:  Head: Normocephalic and atraumatic.  Eyes: EOM are normal.  Neck: Neck supple. No tracheal deviation present.  Cardiovascular: Normal rate.   Pulmonary/Chest: Effort normal. No respiratory distress.  Musculoskeletal: Normal range of motion.  Generalized tenderness to the right knee. No redness warmth or swelling noted. Swelling and bruising to the right great toe  Neurological: He is alert and oriented to person, place, and time.  Skin: Skin is warm and dry.  Psychiatric: He has a normal mood and affect. His behavior is normal.  Nursing note and vitals reviewed.   ED Course  Procedures (including critical care time) DIAGNOSTIC STUDIES: Oxygen Saturation is 100% on room air, normal by my interpretation.    COORDINATION OF CARE: 8:39 PM-Discussed treatment plan which includes X-ray of right knee and right foot with pt at bedside and pt agreed to plan.   Labs Review Labs Reviewed - No data to display  Imaging Review Dg Knee Complete 4 Views Right  04/30/2014   CLINICAL DATA:  Generalized right knee pain starting 2 days ago.  EXAM: RIGHT KNEE - COMPLETE 4+ VIEW  COMPARISON:  None.  FINDINGS: There is no evidence of fracture, dislocation, or joint effusion. There is minimal narrow femoral tibial joint space. Soft tissues are unremarkable.  IMPRESSION: No acute fracture or dislocation. Minimal decreased femoral tibial joint space, degenerative.   Electronically Signed   By: Abelardo Diesel M.D.   On: 04/30/2014 21:13   Dg Foot Complete Right  04/30/2014   CLINICAL DATA:  Initial encounter for foot pain beginning 2-3 days ago. No known injury.  EXAM: RIGHT FOOT COMPLETE - 3+ VIEW  COMPARISON:  No comparison studies available.  FINDINGS: Three view exam of the right foot shows a comminuted fracture of the great toe proximal  phalanx. Degenerative changes are noted in the MTP joint of the great toe. No other acute fracture is evident although assessment is mildly limited by positioning. Plantar spur are noted at the calcaneal tuberosity.  IMPRESSION:  Comminuted fracture of the great toe proximal phalanx.   Electronically Signed   By: Misty Stanley M.D.   On: 04/30/2014 21:13     EKG Interpretation None      MDM   Final diagnoses:  Distal phalanx or phalanges, closed fracture, initial encounter  Muscle strain  Arthritis of knee    Pt placed in post op shoe for toe. No redness or swelling to knee. Pt is neurologically intact.  Will treat for pain and pt has ortho follow up  I personally performed the services described in this documentation, which was scribed in my presence. The recorded information has been reviewed and is accurate.    Ray Docker, NP 04/30/14 9211  Pamella Pert, MD 05/01/14 1140

## 2014-05-03 ENCOUNTER — Ambulatory Visit: Payer: Self-pay | Admitting: Family Medicine

## 2014-06-01 ENCOUNTER — Other Ambulatory Visit: Payer: Self-pay | Admitting: Family Medicine

## 2014-06-01 DIAGNOSIS — E1140 Type 2 diabetes mellitus with diabetic neuropathy, unspecified: Secondary | ICD-10-CM

## 2014-07-21 ENCOUNTER — Other Ambulatory Visit: Payer: Self-pay | Admitting: *Deleted

## 2014-07-21 MED ORDER — GABAPENTIN 100 MG PO CAPS: 100.0000 mg | ORAL_CAPSULE | Freq: Three times a day (TID) | ORAL | Status: DC

## 2014-08-05 ENCOUNTER — Ambulatory Visit (INDEPENDENT_AMBULATORY_CARE_PROVIDER_SITE_OTHER): Payer: BLUE CROSS/BLUE SHIELD | Admitting: Family Medicine

## 2014-08-05 ENCOUNTER — Encounter: Payer: Self-pay | Admitting: Family Medicine

## 2014-08-05 VITALS — BP 154/84 | HR 92 | Temp 98.1°F | Ht 79.0 in | Wt 328.0 lb

## 2014-08-05 DIAGNOSIS — E119 Type 2 diabetes mellitus without complications: Secondary | ICD-10-CM

## 2014-08-05 DIAGNOSIS — S92911G Unspecified fracture of right toe(s), subsequent encounter for fracture with delayed healing: Secondary | ICD-10-CM

## 2014-08-05 DIAGNOSIS — S92401G Displaced unspecified fracture of right great toe, subsequent encounter for fracture with delayed healing: Secondary | ICD-10-CM

## 2014-08-05 DIAGNOSIS — I10 Essential (primary) hypertension: Secondary | ICD-10-CM | POA: Diagnosis not present

## 2014-08-05 LAB — POCT GLYCOSYLATED HEMOGLOBIN (HGB A1C): Hemoglobin A1C: 6.9

## 2014-08-05 NOTE — Patient Instructions (Signed)
Foot pain is likely neuropathy. You can increase the gabapentin (start with at night) by 1 tablet every few days. Increase up to 6 tablets at a time before calling .  Continue measuring blood pressure at the pharmacy, call if your blood pressure is consistently 140/90 or greater.  Monitor your foot for any signs of skin break down (ulcer) at least once a week.

## 2014-08-07 DIAGNOSIS — S92401A Displaced unspecified fracture of right great toe, initial encounter for closed fracture: Secondary | ICD-10-CM | POA: Insufficient documentation

## 2014-08-07 NOTE — Progress Notes (Signed)
   Subjective:    Patient ID: Ray Hall, male    DOB: [birth year], 54 y.o.   MRN: [id number]  HPI  CC: toe pain  # Right great toe pain:  Found broken toe several months ago, he denies any trauma to the area and not sure how it happened (noted that he has neuropathy and cannot feel most of the foot), but went to ED and had X-rays showing great toe fracture  Did not get put in a boot, let it heal on its own  Thinks it has not healed well, continues to have swelling, but still no pain  No drainage, no skin breakdown ROS: numb feet, no fevers or chills  # Hypertension  Previously on meds but most clinic visits had BP in 100-110s  Elevated today, he is not sure why  Does not have any BP cuff at home ROS: no CP, no SOB, no HA  Review of Systems   See HPI for ROS. All other systems reviewed and are negative.  Past medical history, surgical, family, and social history reviewed and updated in the EMR as appropriate. Objective:  BP 154/84 mmHg  Pulse 92  Temp(Src) 98.1 F (36.7 C) (Oral)  Ht 6\' 7"  (2.007 m)  Wt 328 lb (148.78 kg)  BMI 36.94 kg/m2 Vitals and nursing note reviewed  General: NAD CV: unable to palpate DP or PT pulses bilaterally but toes are WWP. MSK: Right great toe nontender to palpation, swelling over whole aspect of great toe compared to the left, slightly red but normal temperature, no skin breakdown, ROM largely intact and able to move the toe.  Neuro: diabetic foot exam documented in Epic, significant neuropathy present.  Assessment & Plan:  See Problem List Documentation

## 2014-08-07 NOTE — Assessment & Plan Note (Signed)
Controlled. A1c 6.9. Not able to discuss this issue in depth today but seems to be tolerating glipizide and metformin without issues. Does not report much change in diet. F/u 3 months.

## 2014-08-07 NOTE — Assessment & Plan Note (Signed)
His BP meds were stopped on discharge from hospital, above goal today. May need to re-initiate lisinopril. Will discuss with patient when calling abotu x-ray results for foot.

## 2014-08-07 NOTE — Assessment & Plan Note (Signed)
Still having persistent swelling, will get repeat x-rays. May need diabetic shoes given neuropathy and apparent slow healing (not sure if surgical shoe would be beneficial at this point). F/u pending x-rays.

## 2014-08-24 ENCOUNTER — Emergency Department (HOSPITAL_COMMUNITY)
Admission: EM | Admit: 2014-08-24 | Discharge: 2014-08-24 | Disposition: A | Discharge status: Home / Self Care | Payer: BLUE CROSS/BLUE SHIELD | Attending: Emergency Medicine | Admitting: Emergency Medicine

## 2014-08-24 ENCOUNTER — Encounter (HOSPITAL_COMMUNITY): Payer: Self-pay

## 2014-08-24 DIAGNOSIS — Z79899 Other long term (current) drug therapy: Secondary | ICD-10-CM | POA: Diagnosis not present

## 2014-08-24 DIAGNOSIS — M542 Cervicalgia: Secondary | ICD-10-CM | POA: Insufficient documentation

## 2014-08-24 DIAGNOSIS — I10 Essential (primary) hypertension: Secondary | ICD-10-CM | POA: Insufficient documentation

## 2014-08-24 DIAGNOSIS — K029 Dental caries, unspecified: Secondary | ICD-10-CM | POA: Insufficient documentation

## 2014-08-24 DIAGNOSIS — R200 Anesthesia of skin: Secondary | ICD-10-CM | POA: Diagnosis not present

## 2014-08-24 DIAGNOSIS — E119 Type 2 diabetes mellitus without complications: Secondary | ICD-10-CM | POA: Diagnosis not present

## 2014-08-24 DIAGNOSIS — K002 Abnormalities of size and form of teeth: Secondary | ICD-10-CM | POA: Diagnosis not present

## 2014-08-24 DIAGNOSIS — Z9889 Other specified postprocedural states: Secondary | ICD-10-CM | POA: Insufficient documentation

## 2014-08-24 DIAGNOSIS — K088 Other specified disorders of teeth and supporting structures: Secondary | ICD-10-CM | POA: Diagnosis present

## 2014-08-24 DIAGNOSIS — R000 Tachycardia, unspecified: Secondary | ICD-10-CM | POA: Insufficient documentation

## 2014-08-24 DIAGNOSIS — Z792 Long term (current) use of antibiotics: Secondary | ICD-10-CM | POA: Diagnosis not present

## 2014-08-24 DIAGNOSIS — R51 Headache: Secondary | ICD-10-CM | POA: Diagnosis not present

## 2014-08-24 DIAGNOSIS — Z7982 Long term (current) use of aspirin: Secondary | ICD-10-CM | POA: Insufficient documentation

## 2014-08-24 DIAGNOSIS — Z862 Personal history of diseases of the blood and blood-forming organs and certain disorders involving the immune mechanism: Secondary | ICD-10-CM | POA: Insufficient documentation

## 2014-08-24 DIAGNOSIS — M25512 Pain in left shoulder: Secondary | ICD-10-CM | POA: Insufficient documentation

## 2014-08-24 DIAGNOSIS — Z720 Tobacco use: Secondary | ICD-10-CM | POA: Diagnosis not present

## 2014-08-24 DIAGNOSIS — K0889 Other specified disorders of teeth and supporting structures: Secondary | ICD-10-CM

## 2014-08-24 MED ORDER — TRAMADOL HCL 50 MG PO TABS: 50.0000 mg | ORAL_TABLET | Freq: Four times a day (QID) | ORAL | Status: DC | PRN

## 2014-08-24 MED ORDER — OXYCODONE-ACETAMINOPHEN 5-325 MG PO TABS
1.0000 | ORAL_TABLET | Freq: Once | ORAL | Status: AC
  Administered 2014-08-24: 1 via ORAL
  Filled 2014-08-24: qty 1

## 2014-08-24 MED ORDER — OXYCODONE-ACETAMINOPHEN 5-325 MG PO TABS: 1.0000 | ORAL_TABLET | Freq: Four times a day (QID) | ORAL | Status: DC | PRN

## 2014-08-24 NOTE — Discharge Instructions (Signed)
Please read and follow all provided instructions.  Your diagnoses today include:  1. Left shoulder pain   2. Pain, dental    Tests performed today include:  Vital signs. See below for your results today.   Medications prescribed:   Tramadol - narcotic-like pain medication  DO NOT drive or perform any activities that require you to be awake and alert because this medicine can make you drowsy.    Percocet (oxycodone/acetaminophen) - narcotic pain medication  DO NOT drive or perform any activities that require you to be awake and alert because this medicine can make you drowsy. BE VERY CAREFUL not to take multiple medicines containing Tylenol (also called acetaminophen). Doing so can lead to an overdose which can damage your liver and cause liver failure and possibly death.  Take any prescribed medications only as directed.  Home care instructions:   Follow any educational materials contained in this packet  Follow R.I.C.E. Protocol:  R - rest your injury   I  - use ice on injury without applying directly to skin  C - compress injury with bandage or splint  E - elevate the injury as much as possible  Follow-up instructions: Please follow-up with your orthopedic physician (bone specialist) if you continue to have significant pain in 1 week. In this case you may have a more severe injury that requires further care.   Return instructions:   Please return if your fingers are numb or tingling, appear gray or blue, or you have severe pain (also elevate the arm and loosen splint or wrap if you were given one)  Please return to the Emergency Department if you experience worsening symptoms.   Please return if you have any other emergent concerns.  Additional Information:  Your vital signs today were: BP 140/102 mmHg   Pulse 108   Temp(Src) 98.5 F (36.9 C) (Oral)   Resp 18   Ht 6\' 7"  (2.007 m)   Wt 315 lb (142.883 kg)   BMI 35.47 kg/m2   SpO2 100% If your blood pressure (BP)  was elevated above 135/85 this visit, please have this repeated by your doctor within one month. --------------

## 2014-08-24 NOTE — ED Notes (Signed)
Pt had shoulder surgery on left shoulder and after therapy yesterday has had increased pain to spot. Pulled  A tooth from the left upper gum yesterday too and has dental pain now too.

## 2014-08-24 NOTE — ED Provider Notes (Signed)
CSN: [id number]     Arrival date & time 08/24/14  1411 History  This chart was scribed for non-physician practitioner, Carlisle Cater, working with Davonna Belling, MD by Molli Posey, ED Scribe. This patient was seen in room TR02C/TR02C and the patient's care was started at 4:12 PM.  Chief Complaint  Patient presents with  . Shoulder Pain  . Dental Pain   HPI  HPI Comments: Ray Hall is a 54 y.o. male with a history of DM and HTN who presents to the Emergency Department complaining of left shoulder pain that started last night. He states that his pain is radiating up his neck and causing him to have a HA. Pt rates his pain as a 10/10 at this time. He states that he was at physical therapy yesterday for his left shoulder which he thinks may have aggravated his pain. Pt reports that he recently had left rotator cuff surgery. He states that he was taking oxycodone and tramadol which he was prescribed but says that he is out at this time. This is confirmed in [place] substance reporting database. Pt states that he is experiencing some left hand numbness at this time but that is chronic. He states that he is currently taking flexeril.   Pt also complains of left upper dental pain. He states that he pulled an entire tooth 2 days ago himself. Pt states he did not notice any drainage or abscess when he pulled the tooth. He denies facial swelling. No neck swelling or trouble swallowing.   Past Medical History  Diagnosis Date  . Sickle cell trait   . Normal nuclear stress test 07/2010    low prob of ischemia, EF 42%  . Echocardiogram abnormal     moderate LVH, mild LV hypokenesis, EF 42%  . History of Doppler ultrasound 07/2010    negative for DVT  . Abnormal CT of the abdomen     mild L hydronephrosis and hydroureter  . Status post radiofrequency ablation for arrhythmia 10/16/10    [facility])  . Diabetes mellitus   . Hypertension    Past Surgical History  Procedure Laterality Date   . Radiofrequency ablation  10/16/10    Cardiac for atrial arrythmia, [facility]  . Dental extractions  10/15/10    prior to ablation   Family History  Problem Relation Age of Onset  . Sickle cell trait    . Heart failure Father   . Diabetes Father   . Diabetes Mother   . Hypertension Mother    History  Substance Use Topics  . Smoking status: Current Every Day Smoker -- 1.50 packs/day for 17 years    Types: Cigars  . Smokeless tobacco: Never Used     Comment: 2 cigars  . Alcohol Use: No    Review of Systems  Constitutional: Negative for fever and activity change.  HENT: Positive for dental problem. Negative for ear pain, facial swelling, sore throat and trouble swallowing.   Respiratory: Negative for shortness of breath and stridor.   Musculoskeletal: Positive for myalgias, arthralgias and neck pain. Negative for back pain, joint swelling and gait problem.  Skin: Negative for color change and wound.  Neurological: Positive for numbness and headaches. Negative for weakness.   Allergies  Review of patient's allergies indicates no known allergies.  Home Medications   Prior to Admission medications   Medication Sig Start Date End Date Taking? Authorizing Provider  Aspirin-Caffeine (BC FAST PAIN RELIEF ARTHRITIS) 1000-65 MG PACK Take 1  tablet by mouth once.    Historical Provider, MD  ciprofloxacin (CIPRO) 250 MG tablet Take 1 tablet (250 mg total) by mouth 2 (two) times daily. 03/05/14   Leeanne Rio, MD  cyclobenzaprine (FLEXERIL) 10 MG tablet Take 10 mg by mouth 3 (three) times daily as needed for muscle spasms.    Historical Provider, MD  diazepam (VALIUM) 5 MG tablet Take 5 mg by mouth 2 (two) times daily.    Historical Provider, MD  gabapentin (NEURONTIN) 100 MG capsule Take 1 capsule (100 mg total) by mouth 3 (three) times daily. 07/21/14   Leone Brand, MD  glipiZIDE (GLUCOTROL) 10 MG tablet TAKE ONE TABLET BY MOUTH TWICE DAILY BEFORE MEAL(S) 06/01/14   Leone Brand, MD   metFORMIN (GLUCOPHAGE) 1000 MG tablet Take 1,000 mg by mouth 2 (two) times daily with a meal.    Historical Provider, MD  methocarbamol (ROBAXIN) 750 MG tablet Take 2 tablets (1,500 mg total) by mouth every 6 (six) hours as needed for muscle spasms. 03/09/14   Leone Brand, MD  oxyCODONE-acetaminophen (PERCOCET) 5-325 MG per tablet Take 1 tablet by mouth every 6 (six) hours as needed. 03/09/14   Leone Brand, MD  oxyCODONE-acetaminophen (PERCOCET/ROXICET) 5-325 MG per tablet Take 2 tablets by mouth every 4 (four) hours as needed for moderate pain or severe pain. 04/30/14   [name], NP   BP 144/99 mmHg  Pulse 120  Temp(Src) 98.1 F (36.7 C)  Resp 18  Ht 6\' 7"  (2.007 m)  Wt 315 lb (142.883 kg)  BMI 35.47 kg/m2  SpO2 100%   Physical Exam  Constitutional: He is oriented to person, place, and time. He appears well-developed and well-nourished.  HENT:  Head: Normocephalic and atraumatic.  Right Ear: Tympanic membrane, external ear and ear canal normal.  Left Ear: Tympanic membrane, external ear and ear canal normal.  Nose: Nose normal.  Mouth/Throat: Uvula is midline, oropharynx is clear and moist and mucous membranes are normal. No trismus in the jaw. Abnormal dentition. Dental caries present. No dental abscesses or uvula swelling. No tonsillar abscesses.  Patient with L maxillary tooth pain and tenderness to palpation in area of premolars. Severe periodontal disease with most teeth are broken at the gumline. No swelling or erythema noted on exam.  Eyes: Pupils are equal, round, and reactive to light. Right eye exhibits no discharge. Left eye exhibits no discharge.  Neck: Normal range of motion. Neck supple. No muscular tenderness present. Carotid bruit is not present. No tracheal deviation and normal range of motion present.  No neck swelling or Lugwig's angina  Cardiovascular: Tachycardia present.   Pulses:      Radial pulses are 2+ on the right side, and 2+ on the left side.   Mild tachycardia 105  Pulmonary/Chest: Effort normal. No respiratory distress.  Abdominal: He exhibits no distension.  Musculoskeletal:       Left shoulder: He exhibits decreased range of motion and tenderness (Posterior shoulder). He exhibits no bony tenderness.       Left elbow: Normal.       Left wrist: Normal.       Cervical back: Normal. He exhibits normal range of motion, no tenderness and no bony tenderness.       Left upper arm: He exhibits no tenderness and no bony tenderness.       Left forearm: Normal.       Left hand: Normal.  Neurological: He is alert and oriented to person, place,  and time.  Skin: Skin is warm and dry.  Psychiatric: He has a normal mood and affect. His behavior is normal.  Nursing note and vitals reviewed.   ED Course  Procedures   DIAGNOSTIC STUDIES: Oxygen Saturation is 100% on RA, normal by my interpretation.    COORDINATION OF CARE: 4:20 PM Discussed treatment plan with pt at bedside and pt agreed to plan.   Labs Review Labs Reviewed - No data to display  Imaging Review No results found.   EKG Interpretation None       Vital signs reviewed and are as follows: Filed Vitals:   08/24/14 1626  BP: 140/102  Pulse: 108  Temp: 98.5 F (36.9 C)  Resp: 18   Patient given pain medication in the ED. Will provide tramadol and #6 Percocet for home. Patient to follow-up with his physical therapist tomorrow. Encourage follow-up with his orthopedic physician. Discussed limited utility of x-ray at this time and unlikely fracture in the shoulder. Encouraged patient to continue Flexeril at home as prescribed.  MDM   Final diagnoses:  Left shoulder pain  Pain, dental   Left shoulder pain: History suggestive of pain after physical therapy. Left upper extremity is neurovascularly intact. Will give patient pain medication for symptom relief. Do not feel x-ray will be helpful at this time.   Patient with toothache. No fever. Exam unconcerning for  Ludwig's angina or other deep tissue infection in neck.   As there is no facial swelling or gum findings, will not prescribe antibiotics at this time. Will treat with pain medication.      I personally performed the services described in this documentation, which was scribed in my presence. The recorded information has been reviewed and is accurate.      Carlisle Cater, PA-C 08/24/14 1640  Davonna Belling, MD 08/27/14 0700

## 2014-09-07 ENCOUNTER — Ambulatory Visit (INDEPENDENT_AMBULATORY_CARE_PROVIDER_SITE_OTHER): Payer: BLUE CROSS/BLUE SHIELD | Admitting: Family Medicine

## 2014-09-07 ENCOUNTER — Encounter: Payer: Self-pay | Admitting: Family Medicine

## 2014-09-07 VITALS — BP 132/87 | HR 111 | Temp 98.4°F | Ht 79.0 in | Wt 313.4 lb

## 2014-09-07 DIAGNOSIS — M545 Low back pain, unspecified: Secondary | ICD-10-CM

## 2014-09-07 DIAGNOSIS — E1140 Type 2 diabetes mellitus with diabetic neuropathy, unspecified: Secondary | ICD-10-CM

## 2014-09-07 MED ORDER — GLIPIZIDE 10 MG PO TABS: ORAL_TABLET | ORAL | Status: DC

## 2014-09-07 MED ORDER — METFORMIN HCL 1000 MG PO TABS: 1000.0000 mg | ORAL_TABLET | Freq: Two times a day (BID) | ORAL | Status: DC

## 2014-09-07 NOTE — Progress Notes (Signed)
   Subjective:    Patient ID: Ray Hall, male    DOB: Aug 28, 1960, 54 y.o.   MRN: [id number]  HPI  CC: back pain  # Neck and back pain:  Seen by [facility] for this issue: Dr. Audree Bane. Did an MRI about 3 months ago and was told there were "2 spots" but can't say exactly what the problem was. Recommended shots and therapy, if that didn't work then operate. Worker's comp denied any treatment. Has a lawyer working on trying to get this covered, seeing to see if they can get a second opinion from another orthopedic to give additional support  Back pain is different now than in the past, more intense, throbbing and shoots down the right leg. Gets numbness in right side fingers, and right thigh tingling (outside)   Tried: taking pain meds from left shoulder surgery (tramadol and percocet... Helped somewhat), taking arthritis strength BC.   Pain in the back bothers him more than the radiating.  ROS: no bowel/bladder incontinence, has baseline neuropathy   Review of Systems   See HPI for ROS. All other systems reviewed and are negative.  Past medical history, surgical, family, and social history reviewed and updated in the EMR as appropriate. Objective:  BP 132/87 mmHg  Pulse 111  Temp(Src) 98.4 F (36.9 C) (Oral)  Ht 6\' 7"  (2.007 m)  Wt 313 lb 7 oz (142.174 kg)  BMI 35.30 kg/m2 Vitals and nursing note reviewed  General: NAD CV: RRR, normal s1s2, no m/r/g Resp: CTAB  Back: mildly tender c-spine, low back. No step-off deformities. SLR positive on the right.  Ext: no edema, cyanosis Neuro: alert and oriented, no focal deficits. Strength grossly normal upper and lower extremity. Able to ambulate without much apparent issue.  Assessment & Plan:  See Problem List Documentation

## 2014-09-07 NOTE — Patient Instructions (Addendum)
The long term treatment for your back pain is physical therapy. Medications like the oxycodone and tramadol will make your pain worse in the long run. Your body gets used to the medicine and when it goes away the pain comes back worse.   Tylenol: can take up to 1000mg  at a time (every 6 hours) for more severe pain. No more than 4000mg  in a 24 hour period  Avoid the BC powder, if you are going to take an NSAID ibuprofen 600-800mg  at time for short periods. Aleve 220mg , last a little longer than the ibuprofen.
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
## 2014-10-25 ENCOUNTER — Other Ambulatory Visit: Payer: Self-pay | Admitting: *Deleted

## 2014-10-25 MED ORDER — CYCLOBENZAPRINE HCL 10 MG PO TABS: 10.0000 mg | ORAL_TABLET | Freq: Three times a day (TID) | ORAL | Status: DC | PRN

## 2014-10-28 HISTORY — PX: ROTATOR CUFF REPAIR: SHX139

## 2014-11-09 ENCOUNTER — Emergency Department (HOSPITAL_COMMUNITY)
Admission: EM | Admit: 2014-11-09 | Discharge: 2014-11-09 | Disposition: A | Discharge status: Home / Self Care | Payer: Self-pay | Attending: Emergency Medicine | Admitting: Emergency Medicine

## 2014-11-09 ENCOUNTER — Emergency Department (HOSPITAL_COMMUNITY): Payer: Self-pay

## 2014-11-09 ENCOUNTER — Encounter (HOSPITAL_COMMUNITY): Payer: Self-pay | Admitting: *Deleted

## 2014-11-09 DIAGNOSIS — I10 Essential (primary) hypertension: Secondary | ICD-10-CM | POA: Insufficient documentation

## 2014-11-09 DIAGNOSIS — Z862 Personal history of diseases of the blood and blood-forming organs and certain disorders involving the immune mechanism: Secondary | ICD-10-CM | POA: Insufficient documentation

## 2014-11-09 DIAGNOSIS — L089 Local infection of the skin and subcutaneous tissue, unspecified: Secondary | ICD-10-CM | POA: Insufficient documentation

## 2014-11-09 DIAGNOSIS — E11628 Type 2 diabetes mellitus with other skin complications: Secondary | ICD-10-CM | POA: Insufficient documentation

## 2014-11-09 DIAGNOSIS — Z720 Tobacco use: Secondary | ICD-10-CM | POA: Insufficient documentation

## 2014-11-09 DIAGNOSIS — Z79899 Other long term (current) drug therapy: Secondary | ICD-10-CM | POA: Insufficient documentation

## 2014-11-09 LAB — CBC WITH DIFFERENTIAL/PLATELET
Basophils Absolute: 0 10*3/uL (ref 0.0–0.1)
Basophils Relative: 0 % (ref 0–1)
Eosinophils Absolute: 0.1 10*3/uL (ref 0.0–0.7)
Eosinophils Relative: 2 % (ref 0–5)
HCT: 35.2 % — ABNORMAL LOW (ref 39.0–52.0)
Hemoglobin: 12.1 g/dL — ABNORMAL LOW (ref 13.0–17.0)
Lymphocytes Relative: 42 % (ref 12–46)
Lymphs Abs: 3.6 10*3/uL (ref 0.7–4.0)
MCH: 27.4 pg (ref 26.0–34.0)
MCHC: 34.4 g/dL (ref 30.0–36.0)
MCV: 79.8 fL (ref 78.0–100.0)
Monocytes Absolute: 0.7 10*3/uL (ref 0.1–1.0)
Monocytes Relative: 8 % (ref 3–12)
Neutro Abs: 4.2 10*3/uL (ref 1.7–7.7)
Neutrophils Relative %: 49 % (ref 43–77)
Platelets: 234 10*3/uL (ref 150–400)
RBC: 4.41 MIL/uL (ref 4.22–5.81)
RDW: 13.2 % (ref 11.5–15.5)
WBC: 8.7 10*3/uL (ref 4.0–10.5)

## 2014-11-09 LAB — BASIC METABOLIC PANEL
Anion gap: 8 (ref 5–15)
BUN: 12 mg/dL (ref 6–20)
CO2: 22 mmol/L (ref 22–32)
Calcium: 9.2 mg/dL (ref 8.9–10.3)
Chloride: 105 mmol/L (ref 101–111)
Creatinine, Ser: 0.98 mg/dL (ref 0.61–1.24)
GFR calc Af Amer: >60 mL/min (ref >60)
GFR calc non Af Amer: >60 mL/min (ref >60)
Glucose, Bld: 208 mg/dL — ABNORMAL HIGH (ref 65–99)
Potassium: 3.9 mmol/L (ref 3.5–5.1)
Sodium: 135 mmol/L (ref 135–145)

## 2014-11-09 MED ORDER — CIPROFLOXACIN HCL 500 MG PO TABS
500.0000 mg | ORAL_TABLET | Freq: Once | ORAL | Status: AC
  Administered 2014-11-09: 500 mg via ORAL
  Filled 2014-11-09: qty 1

## 2014-11-09 MED ORDER — SULFAMETHOXAZOLE-TRIMETHOPRIM 800-160 MG PO TABS
2.0000 | ORAL_TABLET | Freq: Once | ORAL | Status: AC
  Administered 2014-11-09: 2 via ORAL
  Filled 2014-11-09: qty 2

## 2014-11-09 MED ORDER — SULFAMETHOXAZOLE-TRIMETHOPRIM 800-160 MG PO TABS: 2.0000 | ORAL_TABLET | Freq: Two times a day (BID) | ORAL | Status: DC

## 2014-11-09 MED ORDER — CIPROFLOXACIN HCL 750 MG PO TABS: 750.0000 mg | ORAL_TABLET | Freq: Two times a day (BID) | ORAL | Status: DC

## 2014-11-09 NOTE — ED Provider Notes (Signed)
CSN: [id number]     Arrival date & time 11/09/14  1654 History   First MD Initiated Contact with Patient 11/09/14 1822     Chief Complaint  Patient presents with  . Wound Infection  . Foot Pain     (Consider location/radiation/quality/duration/timing/severity/associated sxs/prior Treatment) HPI Patient presents to the emergency department with a wound on the bottom of his foot.  The patient states that he does not know how long the wound was there due to the fact that he has no normal sensation in his foot.  The patient states that he has had some drainage from the wound.  He states he did not follow up with his primary care doctor.  The patient denies fever, nausea, vomiting, weakness, dizziness, headache, blurred vision, back pain, numbness, or syncope.  The patient states that he did clean the wound and applied bacitracin Past Medical History  Diagnosis Date  . Sickle cell trait   . Normal nuclear stress test 07/2010    low prob of ischemia, EF 42%  . Echocardiogram abnormal     moderate LVH, mild LV hypokenesis, EF 42%  . History of Doppler ultrasound 07/2010    negative for DVT  . Abnormal CT of the abdomen     mild L hydronephrosis and hydroureter  . Status post radiofrequency ablation for arrhythmia 10/16/10    [facility]ell Rucks MD)  . Diabetes mellitus   . Hypertension    Past Surgical History  Procedure Laterality Date  . Radiofrequency ablation  10/16/10    Cardiac for atrial arrythmia, [facility]  . Dental extractions  10/15/10    prior to ablation   Family History  Problem Relation Age of Onset  . Sickle cell trait    . Heart failure Father   . Diabetes Father   . Diabetes Mother   . Hypertension Mother    History  Substance Use Topics  . Smoking status: Current Every Day Smoker -- 1.50 packs/day for 17 years    Types: Cigars  . Smokeless tobacco: Never Used     Comment: 2 cigars  . Alcohol Use: No    Review of Systems  All other systems negative except as  documented in the HPI. All pertinent positives and negatives as reviewed in the HPI.  Allergies  Review of patient's allergies indicates no known allergies.  Home Medications   Prior to Admission medications   Medication Sig Start Date End Date Taking? Authorizing Provider  cyclobenzaprine (FLEXERIL) 10 MG tablet Take 1 tablet (10 mg total) by mouth 3 (three) times daily as needed for muscle spasms. 10/25/14   Leone Brand, MD  diazepam (VALIUM) 5 MG tablet Take 5 mg by mouth 2 (two) times daily.    Historical Provider, MD  gabapentin (NEURONTIN) 100 MG capsule Take 1 capsule (100 mg total) by mouth 3 (three) times daily. 07/21/14   Leone Brand, MD  glipiZIDE (GLUCOTROL) 10 MG tablet TAKE ONE TABLET BY MOUTH TWICE DAILY BEFORE MEAL(S) 09/07/14   Leone Brand, MD  metFORMIN (GLUCOPHAGE) 1000 MG tablet Take 1 tablet (1,000 mg total) by mouth 2 (two) times daily with a meal. 09/07/14   Leone Brand, MD  methocarbamol (ROBAXIN) 750 MG tablet Take 2 tablets (1,500 mg total) by mouth every 6 (six) hours as needed for muscle spasms. 03/09/14   Leone Brand, MD  oxyCODONE-acetaminophen (PERCOCET/ROXICET) 5-325 MG per tablet Take 1-2 tablets by mouth every 6 (six) hours as needed for severe pain.  08/24/14   Carlisle Cater, PA-C  traMADol (ULTRAM) 50 MG tablet Take 1 tablet (50 mg total) by mouth every 6 (six) hours as needed. 08/24/14   Carlisle Cater, PA-C   BP 164/94 mmHg  Pulse 88  Temp(Src) 98.2 F (36.8 C) (Oral)  Resp 17  Wt 323 lb (146.512 kg)  SpO2 100% Physical Exam  Constitutional: He is oriented to person, place, and time. He appears well-developed and well-nourished. No distress.  HENT:  Head: Normocephalic and atraumatic.  Mouth/Throat: Oropharynx is clear and moist.  Eyes: Pupils are equal, round, and reactive to light.  Neck: Normal range of motion. Neck supple.  Cardiovascular: Normal rate, regular rhythm and normal heart sounds.  Exam reveals no gallop and no friction rub.    No murmur heard. Genitourinary: Guaiac negative stool.  Musculoskeletal:       Feet:  Neurological: He is alert and oriented to person, place, and time. He exhibits normal muscle tone. Coordination normal.  Skin: Skin is warm and dry. No rash noted. No erythema.  Nursing note and vitals reviewed.   ED Course  Procedures (including critical care time) Labs Review Labs Reviewed  BASIC METABOLIC PANEL - Abnormal; Notable for the following:    Glucose, Bld 208 (*)    All other components within normal limits  CBC WITH DIFFERENTIAL/PLATELET - Abnormal; Notable for the following:    Hemoglobin 12.1 (*)    HCT 35.2 (*)    All other components within normal limits    Imaging Review Dg Foot Complete Left  11/09/2014   CLINICAL DATA:  Open wound at the bottom of the left foot.  EXAM: LEFT FOOT - COMPLETE 3+ VIEW  COMPARISON:  None.  FINDINGS: No fracture or dislocation. No bone destruction or periosteal reaction. Generalized osteopenia. Soft tissue ulceration along the plantar aspect of the left forefoot . There is a plantar calcaneal spur.  IMPRESSION: No radiographic evidence of osteomyelitis of the left foot.   Electronically Signed   By: Kathreen Devoid   On: 11/09/2014 19:26     I spoke with the family practice resident on call and he advised to have the patient come to his office tomorrow.  There is no surrounding erythema, cellulitis or increased swelling to the foot.  This wound does need further evaluation and follow-up.  The patient be placed on anabolic's told to return here as needed.  This may need MRI to further assess the depth and severity of this infection at this point, it is not a significant cellulitic event  Dalia Heading, PA-C 11/09/14 2156  Orpah Greek, MD 11/09/14 2202

## 2014-11-09 NOTE — ED Notes (Signed)
Pt verbalizes understanding of d/c instructions and denies any further needs at this time. 

## 2014-11-09 NOTE — ED Notes (Signed)
Patient transported to X-ray 

## 2014-11-09 NOTE — ED Notes (Addendum)
Pt reports being diabetic and neuropathy. Noticed having a wound to left foot with swelling, drainage and foul odor. Denies fever.

## 2014-11-09 NOTE — Discharge Instructions (Signed)
Follow-up tomorrow at her doctor's office by calling them first thing in the morning for an appointment.

## 2014-11-10 ENCOUNTER — Ambulatory Visit (INDEPENDENT_AMBULATORY_CARE_PROVIDER_SITE_OTHER): Payer: Self-pay | Admitting: Family Medicine

## 2014-11-10 ENCOUNTER — Encounter: Payer: Self-pay | Admitting: Family Medicine

## 2014-11-10 VITALS — BP 159/103 | HR 88 | Temp 98.6°F | Wt 321.0 lb

## 2014-11-10 DIAGNOSIS — L97529 Non-pressure chronic ulcer of other part of left foot with unspecified severity: Secondary | ICD-10-CM

## 2014-11-10 NOTE — Progress Notes (Signed)
    Subjective   Ray Hall is a 54 y.o. male that presents for a same day visit  1. Left foot ulcer: First noticed two days ago. He has noticed some purulent drainage. No fevers, nausea, vomiting. He was seen in the ED yesterday and evaluated. Xray without evidence of osteomyelitis. Given Bactrim and Ciprofloxacin for treatment.  ROS Per HPI  History  Substance Use Topics  . Smoking status: Current Every Day Smoker -- 1.50 packs/day for 17 years    Types: Cigars  . Smokeless tobacco: Never Used     Comment: 2 cigars  . Alcohol Use: No    No Known Allergies  Objective   BP 159/103 mmHg  Pulse 88  Temp(Src) 98.6 F (37 C) (Oral)  Wt 321 lb (145.605 kg)  General: Well appearing, no distress Skin: Left foot with two ulcers. Appear to be Grade 1. No purulent drainage. No tenderness       Assessment and Plan   No orders of the defined types were placed in this encounter.    Left foot ulcer in a diabetic  Discussed starting antibiotics  Red flags including fever, chills, etc for return to clinic  Follow-up with PCP. Will need diabetic shoes especially since he has no sensation

## 2014-11-10 NOTE — Patient Instructions (Signed)
Thank you for coming to see me today. It was a pleasure. Today we talked about:   Left foot ulcer: please pick up your antibiotics. If you have fevers, chills, nausea, vomiting, please make a follow-up appointment. Otherwise, please see Dr. Lamar Benes for follow-up  If you have any questions or concerns, please do not hesitate to call the office at [phone].  Sincerely,  Cordelia Poche, MD

## 2014-11-11 LAB — WOUND CULTURE: Gram Stain: NONE SEEN

## 2014-12-06 ENCOUNTER — Other Ambulatory Visit: Payer: Self-pay | Admitting: Family Medicine

## 2014-12-06 NOTE — Telephone Encounter (Signed)
Refill request from pharmacy. Will forward to PCP for review. Joselynn Amoroso, CMA. 

## 2014-12-16 ENCOUNTER — Encounter: Payer: Self-pay | Admitting: Internal Medicine

## 2014-12-16 ENCOUNTER — Encounter (HOSPITAL_COMMUNITY): Payer: Self-pay | Admitting: *Deleted

## 2014-12-16 ENCOUNTER — Inpatient Hospital Stay (HOSPITAL_COMMUNITY): Payer: Medicaid Other

## 2014-12-16 ENCOUNTER — Ambulatory Visit (INDEPENDENT_AMBULATORY_CARE_PROVIDER_SITE_OTHER): Payer: Self-pay | Admitting: Internal Medicine

## 2014-12-16 ENCOUNTER — Inpatient Hospital Stay (HOSPITAL_COMMUNITY)
Admission: EM | Admit: 2014-12-16 | Discharge: 2014-12-20 | DRG: 872 | Disposition: A | Discharge status: Home / Self Care | Payer: Medicaid Other | Attending: Family Medicine | Admitting: Family Medicine

## 2014-12-16 ENCOUNTER — Other Ambulatory Visit: Payer: Self-pay

## 2014-12-16 ENCOUNTER — Ambulatory Visit (HOSPITAL_COMMUNITY)
Admission: RE | Admit: 2014-12-16 | Discharge: 2014-12-16 | Disposition: A | Discharge status: Home / Self Care | Payer: Self-pay | Source: Ambulatory Visit | Attending: Family Medicine | Admitting: Family Medicine

## 2014-12-16 ENCOUNTER — Emergency Department (HOSPITAL_COMMUNITY): Payer: Medicaid Other

## 2014-12-16 VITALS — BP 142/71 | HR 133 | Temp 103.1°F | Ht 75.0 in | Wt 321.0 lb

## 2014-12-16 DIAGNOSIS — L97509 Non-pressure chronic ulcer of other part of unspecified foot with unspecified severity: Secondary | ICD-10-CM

## 2014-12-16 DIAGNOSIS — M869 Osteomyelitis, unspecified: Secondary | ICD-10-CM

## 2014-12-16 DIAGNOSIS — D649 Anemia, unspecified: Secondary | ICD-10-CM | POA: Diagnosis present

## 2014-12-16 DIAGNOSIS — L97529 Non-pressure chronic ulcer of other part of left foot with unspecified severity: Secondary | ICD-10-CM | POA: Diagnosis present

## 2014-12-16 DIAGNOSIS — I10 Essential (primary) hypertension: Secondary | ICD-10-CM | POA: Diagnosis present

## 2014-12-16 DIAGNOSIS — D573 Sickle-cell trait: Secondary | ICD-10-CM | POA: Diagnosis present

## 2014-12-16 DIAGNOSIS — L0390 Cellulitis, unspecified: Secondary | ICD-10-CM | POA: Diagnosis present

## 2014-12-16 DIAGNOSIS — N179 Acute kidney failure, unspecified: Secondary | ICD-10-CM | POA: Diagnosis present

## 2014-12-16 DIAGNOSIS — A419 Sepsis, unspecified organism: Secondary | ICD-10-CM | POA: Insufficient documentation

## 2014-12-16 DIAGNOSIS — Z791 Long term (current) use of non-steroidal anti-inflammatories (NSAID): Secondary | ICD-10-CM | POA: Diagnosis not present

## 2014-12-16 DIAGNOSIS — Z7982 Long term (current) use of aspirin: Secondary | ICD-10-CM

## 2014-12-16 DIAGNOSIS — E872 Acidosis: Secondary | ICD-10-CM | POA: Diagnosis present

## 2014-12-16 DIAGNOSIS — I440 Atrioventricular block, first degree: Secondary | ICD-10-CM | POA: Insufficient documentation

## 2014-12-16 DIAGNOSIS — R000 Tachycardia, unspecified: Secondary | ICD-10-CM

## 2014-12-16 DIAGNOSIS — F17210 Nicotine dependence, cigarettes, uncomplicated: Secondary | ICD-10-CM | POA: Diagnosis present

## 2014-12-16 DIAGNOSIS — E11621 Type 2 diabetes mellitus with foot ulcer: Secondary | ICD-10-CM | POA: Diagnosis present

## 2014-12-16 DIAGNOSIS — R778 Other specified abnormalities of plasma proteins: Secondary | ICD-10-CM | POA: Insufficient documentation

## 2014-12-16 DIAGNOSIS — Z8679 Personal history of other diseases of the circulatory system: Secondary | ICD-10-CM

## 2014-12-16 DIAGNOSIS — E1165 Type 2 diabetes mellitus with hyperglycemia: Secondary | ICD-10-CM | POA: Diagnosis present

## 2014-12-16 DIAGNOSIS — Z833 Family history of diabetes mellitus: Secondary | ICD-10-CM

## 2014-12-16 DIAGNOSIS — D510 Vitamin B12 deficiency anemia due to intrinsic factor deficiency: Secondary | ICD-10-CM | POA: Diagnosis present

## 2014-12-16 DIAGNOSIS — R079 Chest pain, unspecified: Secondary | ICD-10-CM | POA: Diagnosis present

## 2014-12-16 DIAGNOSIS — E119 Type 2 diabetes mellitus without complications: Secondary | ICD-10-CM

## 2014-12-16 DIAGNOSIS — R7989 Other specified abnormal findings of blood chemistry: Secondary | ICD-10-CM

## 2014-12-16 DIAGNOSIS — E1142 Type 2 diabetes mellitus with diabetic polyneuropathy: Secondary | ICD-10-CM | POA: Diagnosis present

## 2014-12-16 DIAGNOSIS — R6520 Severe sepsis without septic shock: Secondary | ICD-10-CM

## 2014-12-16 DIAGNOSIS — Z79899 Other long term (current) drug therapy: Secondary | ICD-10-CM | POA: Diagnosis not present

## 2014-12-16 DIAGNOSIS — R509 Fever, unspecified: Secondary | ICD-10-CM | POA: Diagnosis not present

## 2014-12-16 DIAGNOSIS — R0789 Other chest pain: Secondary | ICD-10-CM

## 2014-12-16 DIAGNOSIS — I4892 Unspecified atrial flutter: Secondary | ICD-10-CM | POA: Insufficient documentation

## 2014-12-16 DIAGNOSIS — R6510 Systemic inflammatory response syndrome (SIRS) of non-infectious origin without acute organ dysfunction: Secondary | ICD-10-CM | POA: Diagnosis present

## 2014-12-16 LAB — CBC
HCT: 29 % — ABNORMAL LOW (ref 39.0–52.0)
Hemoglobin: 10.3 g/dL — ABNORMAL LOW (ref 13.0–17.0)
MCH: 28.1 pg (ref 26.0–34.0)
MCHC: 35.5 g/dL (ref 30.0–36.0)
MCV: 79.2 fL (ref 78.0–100.0)
PLATELETS: 240 10*3/uL (ref 150–400)
RBC: 3.66 MIL/uL — AB (ref 4.22–5.81)
RDW: 13.6 % (ref 11.5–15.5)
WBC: 19 10*3/uL — AB (ref 4.0–10.5)

## 2014-12-16 LAB — HEPATIC FUNCTION PANEL
ALBUMIN: 3.1 g/dL — AB (ref 3.5–5.0)
ALT: 18 U/L (ref 17–63)
AST: 36 U/L (ref 15–41)
Alkaline Phosphatase: 100 U/L (ref 38–126)
BILIRUBIN DIRECT: 0.6 mg/dL — AB (ref 0.1–0.5)
BILIRUBIN TOTAL: 1.5 mg/dL — AB (ref 0.3–1.2)
Indirect Bilirubin: 0.9 mg/dL (ref 0.3–0.9)
Total Protein: 6.7 g/dL (ref 6.5–8.1)

## 2014-12-16 LAB — BASIC METABOLIC PANEL
Anion gap: 14 (ref 5–15)
BUN: 10 mg/dL (ref 6–20)
CO2: 20 mmol/L — ABNORMAL LOW (ref 22–32)
CREATININE: 1.27 mg/dL — AB (ref 0.61–1.24)
Calcium: 8.7 mg/dL — ABNORMAL LOW (ref 8.9–10.3)
Chloride: 100 mmol/L — ABNORMAL LOW (ref 101–111)
GFR calc Af Amer: >60 mL/min (ref >60)
Glucose, Bld: 231 mg/dL — ABNORMAL HIGH (ref 65–99)
Potassium: 4.5 mmol/L (ref 3.5–5.1)
SODIUM: 134 mmol/L — AB (ref 135–145)

## 2014-12-16 LAB — I-STAT TROPONIN, ED: Troponin i, poc: 0 ng/mL (ref 0.00–0.08)

## 2014-12-16 LAB — I-STAT CG4 LACTIC ACID, ED
Lactic Acid, Venous: 3.93 mmol/L (ref 0.5–2.0)
Lactic Acid, Venous: 3.64 mmol/L (ref 0.5–2.0)
Lactic Acid, Venous: 2.87 mmol/L (ref 0.5–2.0)

## 2014-12-16 LAB — URINALYSIS, ROUTINE W REFLEX MICROSCOPIC
Bilirubin Urine: NEGATIVE
Glucose, UA: NEGATIVE mg/dL
KETONES UR: NEGATIVE mg/dL
Nitrite: NEGATIVE
PH: 5.5 (ref 5.0–8.0)
Protein, ur: 30 mg/dL — AB
Specific Gravity, Urine: 1.015 (ref 1.005–1.030)
Urobilinogen, UA: 2 mg/dL — ABNORMAL HIGH (ref 0.0–1.0)

## 2014-12-16 LAB — URINE MICROSCOPIC-ADD ON

## 2014-12-16 LAB — POCT GLYCOSYLATED HEMOGLOBIN (HGB A1C): Hemoglobin A1C: 7.4

## 2014-12-16 LAB — C-REACTIVE PROTEIN: CRP: 24.6 mg/dL — ABNORMAL HIGH (ref <1.0)

## 2014-12-16 LAB — GLUCOSE, CAPILLARY
GLUCOSE-CAPILLARY: 188 mg/dL — AB (ref 65–99)
GLUCOSE-CAPILLARY: 150 mg/dL — AB (ref 65–99)

## 2014-12-16 LAB — SEDIMENTATION RATE: SED RATE: 75 mm/h — AB (ref 0–16)

## 2014-12-16 MED ORDER — SODIUM CHLORIDE 0.9 % IV BOLUS (SEPSIS)
1000.0000 mL | Freq: Once | INTRAVENOUS | Status: AC
  Administered 2014-12-16: 1000 mL via INTRAVENOUS

## 2014-12-16 MED ORDER — SODIUM CHLORIDE 0.9 % IV SOLN
INTRAVENOUS | Status: DC
  Administered 2014-12-16 – 2014-12-18 (×4): via INTRAVENOUS

## 2014-12-16 MED ORDER — VANCOMYCIN HCL 10 G IV SOLR
1250.0000 mg | Freq: Two times a day (BID) | INTRAVENOUS | Status: DC
  Administered 2014-12-17: 1250 mg via INTRAVENOUS
  Filled 2014-12-16 (×2): qty 1250

## 2014-12-16 MED ORDER — ENSURE ENLIVE PO LIQD: 237.0000 mL | Freq: Two times a day (BID) | ORAL | Status: DC

## 2014-12-16 MED ORDER — PIPERACILLIN-TAZOBACTAM 3.375 G IVPB 30 MIN
3.3750 g | Freq: Once | INTRAVENOUS | Status: AC
  Administered 2014-12-16: 3.375 g via INTRAVENOUS
  Filled 2014-12-16: qty 50

## 2014-12-16 MED ORDER — INSULIN ASPART 100 UNIT/ML ~~LOC~~ SOLN
0.0000 [IU] | Freq: Three times a day (TID) | SUBCUTANEOUS | Status: DC
  Administered 2014-12-17: 3 [IU] via SUBCUTANEOUS
  Administered 2014-12-17 – 2014-12-18 (×3): 7 [IU] via SUBCUTANEOUS
  Administered 2014-12-18: 3 [IU] via SUBCUTANEOUS
  Administered 2014-12-18 – 2014-12-19 (×2): 4 [IU] via SUBCUTANEOUS
  Administered 2014-12-20: 7 [IU] via SUBCUTANEOUS

## 2014-12-16 MED ORDER — ACETAMINOPHEN 325 MG PO TABS
650.0000 mg | ORAL_TABLET | Freq: Four times a day (QID) | ORAL | Status: DC | PRN
  Administered 2014-12-16 – 2014-12-19 (×3): 650 mg via ORAL
  Filled 2014-12-16 (×3): qty 2

## 2014-12-16 MED ORDER — CYCLOBENZAPRINE HCL 10 MG PO TABS
10.0000 mg | ORAL_TABLET | Freq: Three times a day (TID) | ORAL | Status: DC | PRN
  Administered 2014-12-17 – 2014-12-19 (×2): 10 mg via ORAL
  Filled 2014-12-16 (×3): qty 1

## 2014-12-16 MED ORDER — PIPERACILLIN-TAZOBACTAM 3.375 G IVPB
3.3750 g | Freq: Three times a day (TID) | INTRAVENOUS | Status: DC
  Administered 2014-12-17 (×2): 3.375 g via INTRAVENOUS
  Filled 2014-12-16 (×4): qty 50

## 2014-12-16 MED ORDER — METHOCARBAMOL 500 MG PO TABS: 1500.0000 mg | ORAL_TABLET | Freq: Four times a day (QID) | ORAL | Status: DC | PRN

## 2014-12-16 MED ORDER — HEPARIN SODIUM (PORCINE) 5000 UNIT/ML IJ SOLN
5000.0000 [IU] | Freq: Three times a day (TID) | INTRAMUSCULAR | Status: DC
  Administered 2014-12-16 – 2014-12-19 (×4): 5000 [IU] via SUBCUTANEOUS
  Filled 2014-12-16 (×4): qty 1

## 2014-12-16 MED ORDER — DIPHENHYDRAMINE-APAP (SLEEP) 25-500 MG PO TABS: 2.0000 | ORAL_TABLET | Freq: Every day | ORAL | Status: DC

## 2014-12-16 MED ORDER — DIPHENHYDRAMINE HCL 25 MG PO CAPS
25.0000 mg | ORAL_CAPSULE | Freq: Every evening | ORAL | Status: DC | PRN
  Administered 2014-12-16 – 2014-12-17 (×2): 25 mg via ORAL
  Filled 2014-12-16 (×2): qty 1

## 2014-12-16 MED ORDER — SODIUM CHLORIDE 0.9 % IV SOLN: INTRAVENOUS | Status: DC

## 2014-12-16 MED ORDER — GADOBENATE DIMEGLUMINE 529 MG/ML IV SOLN
20.0000 mL | Freq: Once | INTRAVENOUS | Status: AC | PRN
  Administered 2014-12-16: 20 mL via INTRAVENOUS

## 2014-12-16 MED ORDER — TRAMADOL HCL 50 MG PO TABS
50.0000 mg | ORAL_TABLET | Freq: Four times a day (QID) | ORAL | Status: DC | PRN
  Administered 2014-12-17 – 2014-12-20 (×2): 50 mg via ORAL
  Filled 2014-12-16 (×2): qty 1

## 2014-12-16 MED ORDER — SODIUM CHLORIDE 0.9 % IV BOLUS (SEPSIS)
2000.0000 mL | Freq: Once | INTRAVENOUS | Status: AC
  Administered 2014-12-16: 2000 mL via INTRAVENOUS

## 2014-12-16 MED ORDER — GABAPENTIN 100 MG PO CAPS
100.0000 mg | ORAL_CAPSULE | Freq: Three times a day (TID) | ORAL | Status: DC
  Administered 2014-12-16 – 2014-12-20 (×11): 100 mg via ORAL
  Filled 2014-12-16 (×12): qty 1

## 2014-12-16 MED ORDER — VANCOMYCIN HCL 10 G IV SOLR
2000.0000 mg | Freq: Once | INTRAVENOUS | Status: AC
  Administered 2014-12-16: 2000 mg via INTRAVENOUS
  Filled 2014-12-16: qty 2000

## 2014-12-16 MED ORDER — ACETAMINOPHEN 500 MG PO TABS
1000.0000 mg | ORAL_TABLET | Freq: Once | ORAL | Status: AC
  Administered 2014-12-16: 1000 mg via ORAL
  Filled 2014-12-16: qty 2

## 2014-12-16 NOTE — Assessment & Plan Note (Signed)
Taking metformin and glipizide. Checks blood sugar infrequently, but says he is usually in the 130-160 range. POCT glucose 188 today.  - Cont glipizide and metformin

## 2014-12-16 NOTE — Progress Notes (Signed)
Subjective:    Patient ID: Ray Hall, male    DOB: 18-Jul-1960, 54 y.o.   MRN: [id number]  HPI  Ray Hall is a 54 yo M with PMH of Type II DM, HTN, and diabetic foot ulcer presenting for worsening foot ulcer.  Ray Hall was seen in the ED and then our clinic about one month ago for a new foot ulcer. Xray at the time showed no osteomyelitis, and he was given a 10 day course of PO cipro and bactrim.  Today, he reports that the ulcer has not improved. He says he finished taking the antibiotics last week (if taken as prescribed, he should have completed the course three weeks ago). He says the ulcer continues to drain pus and blood and has been malodorous. Yesterday, he began to experience shaking, alternating chills and hot flashes, headache, and neck pain. The symptoms have worsened since then. He also endorses L nipple pain, and pain below his L breast. He denies trauma, and says this started yesterday as well.  He says he does not know if light or sound affects his HA, but the pain is primarily on the sides of his head. He describes the neck pain as much more intense than a muscle spasm, and says his shaking increases with the neck pain is most intense. He denies N/V/dizziness. Endorses SOB, specifically difficulty taking a deep breath.    Review of Systems  Constitutional: Positive for fever and chills.  Eyes:       Questionable photophobia  Respiratory: Positive for shortness of breath.   Cardiovascular:       Chest pain under L breast  Gastrointestinal: Negative for nausea, vomiting and abdominal pain.  Musculoskeletal: Positive for neck pain and neck stiffness.       Pain in L shoulder attributed to recent rotator cuff surgery  Skin: Positive for wound (on L sole ).  Neurological: Positive for tremors and headaches. Negative for dizziness and light-headedness.       Objective:   Physical Exam  Constitutional: He is oriented to person, place, and time.  Patient in wheelchair,  shaking, looking very unwell  Neck: Normal range of motion.  Cardiovascular:  Tachycardic, regular rhythm, no murmurs noted  Pulmonary/Chest: Effort normal and breath sounds normal. No respiratory distress. He has no wheezes. He exhibits tenderness.  Abdominal: Soft. Bowel sounds are normal. He exhibits no distension. There is no tenderness.  Musculoskeletal: He exhibits no edema or tenderness.  Open wound on plantar surface of L foot, currently not draining, but minimal bloody drainage on bandage. Non-tender to palpation (patient reports inability to feel my touch at all).  L breast enlarged and tender to palpation with tense skin across nipple.   Neurological: He is alert and oriented to person, place, and time. No cranial nerve deficit.  Skin: No rash noted. No erythema.  Psychiatric: He has a normal mood and affect. His behavior is normal.   BP 142/71 mmHg  Pulse 133  Temp(Src) 103.1 F (39.5 C) (Oral)  Ht 6\' 3"  (1.905 m)  Wt 321 lb (145.605 kg)  BMI 40.12 kg/m2     Assessment & Plan:   Ray Hall is a 54 yo M with PMH of Type II DM, HTN, and diabetic foot ulcer presenting for worsening foot ulcer.   1. Foot ulcer: Given patient's ill appearance/rigors, tachycardia, fever, and potential source of infection (foot ulcer), concern for sepsis secondary to spread of infected wound. Wound persists despite outpatient treatment with  two abx (cipro and bactrim), so can consider failed outpatient abx tx.  - Promptly send to ED for further evaluation - Recommend repeat imaging to r/o osteo  2. Headache/Neck pain: given combo and fever, concern for meningitis - Recommend thorough neuro exam in ED  3. Atypical chest pain: question of whether this pain is related to his painful nipple or is of cardiac etiology. EKG performed in office showed sinus tach with potential ischemia in lateral leads. - Recommend cont work-up in ED  4. Gynecomastia/nipple pain: enlarged L breast with skin tense  across nipple. Tender to palpation.   5. Type II Diabetes mellitus: taking metformin and glipizide. Checks blood sugar infrequently, but says he is usually in the 130-160 range. POCT glucose 188 today.  - Cont glipizide and metformin  6. HTN: BP 142/71 - Currently not on anti-hypertensives - Monitor at follow-up appointments

## 2014-12-16 NOTE — Assessment & Plan Note (Signed)
BP 142/71 at this visit - Currently not on anti-hypertensives - Monitor at follow-up appointments

## 2014-12-16 NOTE — ED Provider Notes (Signed)
CSN: [id number]     Arrival date & time 12/16/14  1541 History   First MD Initiated Contact with Patient 12/16/14 1550     Chief Complaint  Patient presents with  . Chest Pain     (Consider location/radiation/quality/duration/timing/severity/associated sxs/prior Treatment) Patient is a 54 y.o. male presenting with chest pain. The history is provided by the patient.  Chest Pain Pain location:  Substernal area Pain quality: aching and sharp   Pain radiates to:  Does not radiate Pain radiates to the back: no   Pain severity:  Moderate Onset quality:  Sudden Duration:  2 weeks Timing:  Constant Progression:  Worsening Chronicity:  New Relieved by:  Nothing Worsened by:  Coughing Ineffective treatments:  None tried Associated symptoms: cough, fatigue and fever   Associated symptoms: no abdominal pain, no headache, no palpitations, no shortness of breath and not vomiting    54 yo M with a significant past medical history of poorly controlled diabetes comes in with the chief complaint of fevers and chills. Patient went to his family medicine office today was found to be in sinus tachycardia and febrile and sent here for further evaluation. Patient has been followed for a diabetic ulcer on his left foot. Finished a course of Cipro and Bactrim about 2 weeks ago. Stated that had some transient improvement and then had recurrence of purulent discharge couple days ago. Patient also self caths and has had recurrent urinary tract infections also has been complaining of cough congestion for the past 2 weeks. Patient thinks he got cold from one of his nephews.  Past Medical History  Diagnosis Date  . Sickle cell trait   . Normal nuclear stress test 07/2010    low prob of ischemia, EF 42%  . Echocardiogram abnormal     moderate LVH, mild LV hypokenesis, EF 42%  . History of Doppler ultrasound 07/2010    negative for DVT  . Abnormal CT of the abdomen     mild L hydronephrosis and hydroureter  .  Status post radiofrequency ablation for arrhythmia 10/16/10    [facility] Rucks MD)  . Diabetes mellitus   . Hypertension    Past Surgical History  Procedure Laterality Date  . Radiofrequency ablation  10/16/10    Cardiac for atrial arrythmia, [facility]  . Dental extractions  10/15/10    prior to ablation  . Patella fracture surgery      metal rod left leg   Family History  Problem Relation Age of Onset  . Sickle cell trait    . Heart failure Father   . Diabetes Father   . Diabetes Mother   . Hypertension Mother    Social History  Substance Use Topics  . Smoking status: Current Every Day Smoker -- 1.50 packs/day for 17 years    Types: Cigars  . Smokeless tobacco: Never Used     Comment: 2 cigars  . Alcohol Use: No    Review of Systems  Constitutional: Positive for fever, chills and fatigue.       Rigors  HENT: Negative for congestion and facial swelling.   Eyes: Negative for discharge and visual disturbance.  Respiratory: Positive for cough. Negative for shortness of breath.   Cardiovascular: Negative for chest pain and palpitations.  Gastrointestinal: Negative for vomiting, abdominal pain and diarrhea.  Musculoskeletal: Negative for myalgias, arthralgias and neck pain.  Skin: Positive for wound (diabetic ulcer to the left plantar surface). Negative for color change and rash.  Neurological: Negative for  tremors, syncope and headaches.  Psychiatric/Behavioral: Negative for confusion and dysphoric mood.      Allergies  Review of patient's allergies indicates no known allergies.  Home Medications   Prior to Admission medications   Medication Sig Start Date End Date Taking? Authorizing Provider  Aspirin-Caffeine (BC FAST PAIN RELIEF ARTHRITIS) 1000-65 MG PACK Take 1 Package by mouth daily as needed (for pain).   Yes Historical Provider, MD  cyclobenzaprine (FLEXERIL) 10 MG tablet TAKE ONE TABLET BY MOUTH THREE TIMES DAILY AS NEEDED FOR MUSCLE SPASM 12/06/14  Yes Leone Brand, MD  diphenhydramine-acetaminophen (TYLENOL PM) 25-500 MG TABS Take 2 tablets by mouth at bedtime.   Yes Historical Provider, MD  gabapentin (NEURONTIN) 100 MG capsule Take 1 capsule (100 mg total) by mouth 3 (three) times daily. 07/21/14  Yes Leone Brand, MD  glipiZIDE (GLUCOTROL) 10 MG tablet TAKE ONE TABLET BY MOUTH TWICE DAILY BEFORE MEAL(S) 09/07/14  Yes Leone Brand, MD  metFORMIN (GLUCOPHAGE) 1000 MG tablet Take 1 tablet (1,000 mg total) by mouth 2 (two) times daily with a meal. 09/07/14  Yes Leone Brand, MD  naproxen sodium (ANAPROX) 220 MG tablet Take 440 mg by mouth every morning.   Yes Historical Provider, MD  nitrofurantoin (MACRODANTIN) 50 MG capsule Take 50 mg by mouth every other day. Patient states he takes every day   Yes Historical Provider, MD  ciprofloxacin (CIPRO) 750 MG tablet Take 1 tablet (750 mg total) by mouth 2 (two) times daily. Patient not taking: Reported on 12/16/2014 11/09/14   Dalia Heading, PA-C  methocarbamol (ROBAXIN) 750 MG tablet Take 2 tablets (1,500 mg total) by mouth every 6 (six) hours as needed for muscle spasms. Patient not taking: Reported on 11/09/2014 03/09/14   Leone Brand, MD  sulfamethoxazole-trimethoprim (BACTRIM DS,SEPTRA DS) 800-160 MG per tablet Take 2 tablets by mouth 2 (two) times daily. Patient not taking: Reported on 12/16/2014 11/09/14   Dalia Heading, PA-C  traMADol (ULTRAM) 50 MG tablet Take 1 tablet (50 mg total) by mouth every 6 (six) hours as needed. Patient not taking: Reported on 11/09/2014 08/24/14   Carlisle Cater, PA-C   BP 123/72 mmHg  Pulse 102  Temp(Src) 99 F (37.2 C) (Oral)  Resp 16  Ht 6\' 3"  (1.905 m)  Wt 322 lb 8.5 oz (146.3 kg)  BMI 40.31 kg/m2  SpO2 100% Physical Exam  Constitutional: He is oriented to person, place, and time. He appears well-developed and well-nourished.  HENT:  Head: Normocephalic and atraumatic.  Eyes: EOM are normal. Pupils are equal, round, and reactive to light.  Neck:  Normal range of motion. Neck supple. No JVD present.  Cardiovascular: Normal rate and regular rhythm.  Exam reveals no gallop and no friction rub.   No murmur heard. Pulmonary/Chest: Breath sounds normal. No respiratory distress. He has no wheezes. He has no rales.  Abdominal: He exhibits no distension. There is no tenderness. There is no rebound and no guarding.  Musculoskeletal: Normal range of motion. He exhibits tenderness.  Wound to the plantar aspect of the left foot between the third and fourth metatarsal. Clear drainage. Tender palpation. Left foot is warmer to touch than the right foot.  Neurological: He is alert and oriented to person, place, and time.  Skin: No rash noted. No pallor.  Psychiatric: He has a normal mood and affect. His behavior is normal.    ED Course  Procedures (including critical care time) Labs Review Labs Reviewed  BASIC METABOLIC PANEL -  Abnormal; Notable for the following:    Sodium 134 (*)    Chloride 100 (*)    CO2 20 (*)    Glucose, Bld 231 (*)    Creatinine, Ser 1.27 (*)    Calcium 8.7 (*)    All other components within normal limits  CBC - Abnormal; Notable for the following:    WBC 19.0 (*)    RBC 3.66 (*)    Hemoglobin 10.3 (*)    HCT 29.0 (*)    All other components within normal limits  HEPATIC FUNCTION PANEL - Abnormal; Notable for the following:    Albumin 3.1 (*)    Total Bilirubin 1.5 (*)    Bilirubin, Direct 0.6 (*)    All other components within normal limits  SEDIMENTATION RATE - Abnormal; Notable for the following:    Sed Rate 75 (*)    All other components within normal limits  GLUCOSE, CAPILLARY - Abnormal; Notable for the following:    Glucose-Capillary 150 (*)    All other components within normal limits  I-STAT CG4 LACTIC ACID, ED - Abnormal; Notable for the following:    Lactic Acid, Venous 3.93 (*)    All other components within normal limits  I-STAT CG4 LACTIC ACID, ED - Abnormal; Notable for the following:     Lactic Acid, Venous 3.64 (*)    All other components within normal limits  I-STAT CG4 LACTIC ACID, ED - Abnormal; Notable for the following:    Lactic Acid, Venous 2.87 (*)    All other components within normal limits  CULTURE, BLOOD (ROUTINE X 2)  CULTURE, BLOOD (ROUTINE X 2)  URINE CULTURE  URINALYSIS, ROUTINE W REFLEX MICROSCOPIC (NOT AT [facility])  C-REACTIVE PROTEIN  BASIC METABOLIC PANEL  CBC  TROPONIN I  LIPID PANEL  I-STAT TROPOININ, ED  I-STAT CG4 LACTIC ACID, ED    Imaging Review Dg Chest 2 View  12/16/2014   CLINICAL DATA:  Cough for 2 weeks.  Chest pain .  EXAM: CHEST  2 VIEW  COMPARISON:  03/02/2014 .  FINDINGS: Mediastinum and hilar structures normal. Cardiomegaly with normal pulmonary vascularity. Low lung volumes with mild bibasilar atelectasis. No pleural effusion or pneumothorax.  IMPRESSION: 1.  Cardiomegaly, no CHF.  2.  Low lung volumes with mild bibasilar subsegmental atelectasis.   Electronically Signed   By: Marcello Moores  Register   On: 12/16/2014 17:00   Dg Foot Complete Left  12/16/2014   CLINICAL DATA:  Left foot pain. Diabetic ulcer on bottom of left foot.  EXAM: LEFT FOOT - COMPLETE 3+ VIEW  COMPARISON:  11/09/2014  FINDINGS: No acute bony abnormality. Specifically, no fracture, subluxation, or dislocation. No radiographic changes of osteomyelitis.  IMPRESSION: No acute bony abnormality.   Electronically Signed   By: Rolm Baptise M.D.   On: 12/16/2014 17:01   I have personally reviewed and evaluated these images and lab results as part of my medical decision-making.   EKG Interpretation   Date/Time:  Friday December 16 2014 15:45:02 EDT Ventricular Rate:  119 PR Interval:  156 QRS Duration: 79 QT Interval:  289 QTC Calculation: 407 R Axis:   32 Text Interpretation:  Sinus tachycardia Borderline repolarization  abnormality No significant change was found Confirmed by Jimel Myler MD, DANIEL  ([id number]) on 12/16/2014 11:10:39 PM      MDM   1. SIRS   2.     Sinus  tachycardia 3.     Diabetic wound 4.    Lactic acidosis  54 yo M with a chief complaint of possible systemic infection. Patient with a lactate 3.9. We'll give 3 L of IV fluids. Tachycardic into the 120s. Patient with multiple possible etiologies of infection the most concerning for cellulitis versus osteomyelitis of the left foot. Will cover with vanc and Zosyn. Blood cultures urine cultures chest x-ray CBC.  Patient with improvement of tachycardia with IV fluids and Tylenol. Trending lactates continued to decrease. Discussed case with family medicine will admit to their service.  The patients results and plan were reviewed and discussed.   Any x-rays performed were independently reviewed by myself.   Differential diagnosis were considered with the presenting HPI.  Medications  0.9 %  sodium chloride infusion (not administered)  methocarbamol (ROBAXIN) tablet 1,500 mg (not administered)  traMADol (ULTRAM) tablet 50 mg (not administered)  cyclobenzaprine (FLEXERIL) tablet 10 mg (not administered)  gabapentin (NEURONTIN) capsule 100 mg (not administered)  heparin injection 5,000 Units (not administered)  0.9 %  sodium chloride infusion ( Intravenous New Bag/Given 12/16/14 2036)  insulin aspart (novoLOG) injection 0-20 Units (not administered)  acetaminophen (TYLENOL) tablet 650 mg (not administered)  diphenhydrAMINE (BENADRYL) capsule 25 mg (not administered)  feeding supplement (ENSURE ENLIVE) (ENSURE ENLIVE) liquid 237 mL (not administered)  piperacillin-tazobactam (ZOSYN) IVPB 3.375 g (not administered)  vancomycin (VANCOCIN) 1,250 mg in sodium chloride 0.9 % 250 mL IVPB (not administered)  sodium chloride 0.9 % bolus 1,000 mL (0 mLs Intravenous Stopped 12/16/14 1814)  sodium chloride 0.9 % bolus 2,000 mL (2,000 mLs Intravenous New Bag/Given 12/16/14 1656)  vancomycin (VANCOCIN) 2,000 mg in sodium chloride 0.9 % 500 mL IVPB (2,000 mg Intravenous New Bag/Given 12/16/14 1655)   piperacillin-tazobactam (ZOSYN) IVPB 3.375 g (0 g Intravenous Stopped 12/16/14 1747)  acetaminophen (TYLENOL) tablet 1,000 mg (1,000 mg Oral Given 12/16/14 1717)  gadobenate dimeglumine (MULTIHANCE) injection 20 mL (20 mLs Intravenous Contrast Given 12/16/14 2200)    Filed Vitals:   12/16/14 1815 12/16/14 1821 12/16/14 1845 12/16/14 1944  BP: 137/84  126/66 123/72  Pulse: 105  100 102  Temp:  100.4 F (38 C)  99 F (37.2 C)  TempSrc:  Oral  Oral  Resp: 26  12 16   Height:    6\' 3"  (1.905 m)  Weight:    322 lb 8.5 oz (146.3 kg)  SpO2: 100%  100% 100%    Final diagnoses:  Osteomyelitis    Admission/ observation were discussed with the admitting physician, patient and/or family and they are comfortable with the plan.     Deno Etienne, DO 12/16/14 2311

## 2014-12-16 NOTE — Progress Notes (Signed)
MRI called for diabetic ulcer on left foot. Pt reports patellar sugery in 2005. Called MRI for clarification on safety of MRI performed on metal rod. MRI reports it should be safe as it's noted to be done in 2005. Reported to MD. Thedora Hinders, RN

## 2014-12-16 NOTE — ED Notes (Signed)
Spoke with main lab - will add on sed rate. Phlebotomy aware of need for CRP draw.
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
## 2014-12-16 NOTE — ED Notes (Signed)
Denies chest pain at this time.

## 2014-12-16 NOTE — ED Notes (Signed)
Pt arrives via EMS from [facility] where he was being seen for left foot diabetic ulcer. Pt also c/o chest pain for 2 days at the MD office and they did an EKG ST 124 and called EMS due to the high heart rate. Pt states he has had a cold/cough for 2 weeks. Headache and chest pain x 2 days. Pt is on antibiotics for foot ulcer.

## 2014-12-16 NOTE — Progress Notes (Signed)
ANTIBIOTIC CONSULT NOTE - INITIAL  Pharmacy Consult for vanc/zosyn Indication: diabetic foot ulcer, concern for underlying OM, sepsis  No Known Allergies  Patient Measurements: Height: 6\' 3"  (190.5 cm) Weight: (!) 322 lb 8.5 oz (146.3 kg) IBW/kg (Calculated) : 84.5   Vital Signs: Temp: 99 F (37.2 C) (08/19 1944) Temp Source: Oral (08/19 1944) BP: 123/72 mmHg (08/19 1944) Pulse Rate: 102 (08/19 1944) Intake/Output from previous day:   Intake/Output from this shift: Total I/O In: 1500 [I.V.:1500] Out: -   Labs:  Recent Labs  12/16/14 1553  WBC 19.0*  HGB 10.3*  PLT 240  CREATININE 1.27*   Estimated Creatinine Clearance: 106.3 mL/min (by C-G formula based on Cr of 1.27). No results for input(s): VANCOTROUGH, VANCOPEAK, VANCORANDOM, GENTTROUGH, GENTPEAK, GENTRANDOM, TOBRATROUGH, TOBRAPEAK, TOBRARND, AMIKACINPEAK, AMIKACINTROU, AMIKACIN in the last 72 hours.   Microbiology: No results found for this or any previous visit (from the past 720 hour(s)).  Medical History: Past Medical History  Diagnosis Date  . Sickle cell trait   . Normal nuclear stress test 07/2010    low prob of ischemia, EF 42%  . Echocardiogram abnormal     moderate LVH, mild LV hypokenesis, EF 42%  . History of Doppler ultrasound 07/2010    negative for DVT  . Abnormal CT of the abdomen     mild L hydronephrosis and hydroureter  . Status post radiofrequency ablation for arrhythmia 10/16/10    [facility] R[facility] MD)  . Diabetes mellitus   . Hypertension     Assessment: 37 yom from [facility] with worsening diabetic foot ulcer, failed outpatient oral abx (cipro/bactrim). Pharmacy consulted to dose vanc/zosyn for diabetic foot ulcer, concern for underlying OM, sepsis. Tmax/24h 103.1, currently afebrile, wbc elevated 19. LA 3.64 on admit>>2.87. Given 1x doses of vanc/zosyn in ED - MD noted to continue in note, forgot to put in consults. SCr 1.27 on admit, normalized CrCl~70  8/19 vanc>> 8/19  zosyn>>  8/19 BCx2>> 8/19 UC>>   Goal of Therapy:  Vancomycin trough level 15-20 mcg/ml  Plan:  Vanc 2g x 1 dose given in ED; continue Vanc 1250mg  IV q12h per obesity nomogram Zosyn 3.375g IV (8min inf) x 1 given in ED; then Zosyn 3.375g IV q8h Mon clinical progress, c/s, renal function, abx plan  Elicia Lamp, PharmD Clinical Pharmacist Pager [phone] 12/16/2014 10:44 PM

## 2014-12-16 NOTE — ED Notes (Signed)
MD at bedside. 

## 2014-12-17 ENCOUNTER — Other Ambulatory Visit: Payer: Self-pay

## 2014-12-17 DIAGNOSIS — R7989 Other specified abnormal findings of blood chemistry: Secondary | ICD-10-CM | POA: Insufficient documentation

## 2014-12-17 DIAGNOSIS — R778 Other specified abnormalities of plasma proteins: Secondary | ICD-10-CM | POA: Insufficient documentation

## 2014-12-17 DIAGNOSIS — Z8679 Personal history of other diseases of the circulatory system: Secondary | ICD-10-CM

## 2014-12-17 DIAGNOSIS — R079 Chest pain, unspecified: Secondary | ICD-10-CM | POA: Diagnosis present

## 2014-12-17 LAB — CBC
HCT: 26.4 % — ABNORMAL LOW (ref 39.0–52.0)
Hemoglobin: 9.2 g/dL — ABNORMAL LOW (ref 13.0–17.0)
MCH: 27.7 pg (ref 26.0–34.0)
MCHC: 34.8 g/dL (ref 30.0–36.0)
MCV: 79.5 fL (ref 78.0–100.0)
PLATELETS: 259 10*3/uL (ref 150–400)
RBC: 3.32 MIL/uL — AB (ref 4.22–5.81)
RDW: 13.8 % (ref 11.5–15.5)
WBC: 17.8 10*3/uL — AB (ref 4.0–10.5)

## 2014-12-17 LAB — BASIC METABOLIC PANEL
Anion gap: 9 (ref 5–15)
BUN: 9 mg/dL (ref 6–20)
CALCIUM: 8.1 mg/dL — AB (ref 8.9–10.3)
CO2: 22 mmol/L (ref 22–32)
CREATININE: 0.97 mg/dL (ref 0.61–1.24)
Chloride: 105 mmol/L (ref 101–111)
GFR calc non Af Amer: >60 mL/min (ref >60)
Glucose, Bld: 126 mg/dL — ABNORMAL HIGH (ref 65–99)
Potassium: 3.8 mmol/L (ref 3.5–5.1)
SODIUM: 136 mmol/L (ref 135–145)

## 2014-12-17 LAB — VITAMIN B12: VITAMIN B 12: 146 pg/mL — AB (ref 180–914)

## 2014-12-17 LAB — GLUCOSE, CAPILLARY
GLUCOSE-CAPILLARY: 134 mg/dL — AB (ref 65–99)
GLUCOSE-CAPILLARY: 218 mg/dL — AB (ref 65–99)
GLUCOSE-CAPILLARY: 242 mg/dL — AB (ref 65–99)
GLUCOSE-CAPILLARY: 231 mg/dL — AB (ref 65–99)

## 2014-12-17 LAB — TROPONIN I
TROPONIN I: 0.1 ng/mL — AB (ref <0.031)
TROPONIN I: 0.09 ng/mL — AB (ref <0.031)
Troponin I: 0.1 ng/mL — ABNORMAL HIGH (ref <0.031)

## 2014-12-17 LAB — LIPID PANEL
Cholesterol: 100 mg/dL (ref 0–200)
HDL: 26 mg/dL — AB (ref >40)
LDL Cholesterol: 47 mg/dL (ref 0–99)
TRIGLYCERIDES: 135 mg/dL (ref <150)
Total CHOL/HDL Ratio: 3.8 RATIO
VLDL: 27 mg/dL (ref 0–40)

## 2014-12-17 LAB — FOLATE: FOLATE: 8.2 ng/mL (ref >5.9)

## 2014-12-17 MED ORDER — BACID PO TABS
2.0000 | ORAL_TABLET | Freq: Three times a day (TID) | ORAL | Status: DC
  Administered 2014-12-17 – 2014-12-19 (×8): 2 via ORAL
  Filled 2014-12-17 (×11): qty 2

## 2014-12-17 MED ORDER — NITROGLYCERIN 0.4 MG SL SUBL: 0.4000 mg | SUBLINGUAL_TABLET | SUBLINGUAL | Status: DC | PRN

## 2014-12-17 MED ORDER — VANCOMYCIN HCL 10 G IV SOLR
1500.0000 mg | Freq: Two times a day (BID) | INTRAVENOUS | Status: DC
  Filled 2014-12-17: qty 1500

## 2014-12-17 MED ORDER — GLUCERNA SHAKE PO LIQD
237.0000 mL | Freq: Two times a day (BID) | ORAL | Status: DC
  Administered 2014-12-17: 237 mL via ORAL

## 2014-12-17 MED ORDER — NAPROXEN 250 MG PO TABS
250.0000 mg | ORAL_TABLET | Freq: Once | ORAL | Status: AC
  Administered 2014-12-17: 250 mg via ORAL
  Filled 2014-12-17: qty 1

## 2014-12-17 MED ORDER — CYANOCOBALAMIN 1000 MCG/ML IJ SOLN
1000.0000 ug | Freq: Every day | INTRAMUSCULAR | Status: AC
  Administered 2014-12-17 – 2014-12-19 (×3): 1000 ug via INTRAMUSCULAR
  Filled 2014-12-17 (×5): qty 1

## 2014-12-17 MED ORDER — CLINDAMYCIN HCL 300 MG PO CAPS
300.0000 mg | ORAL_CAPSULE | Freq: Four times a day (QID) | ORAL | Status: DC
  Administered 2014-12-17 – 2014-12-18 (×6): 300 mg via ORAL
  Filled 2014-12-17 (×6): qty 1

## 2014-12-17 MED ORDER — ASPIRIN 325 MG PO TABS
325.0000 mg | ORAL_TABLET | Freq: Every day | ORAL | Status: DC
  Administered 2014-12-17 – 2014-12-19 (×3): 325 mg via ORAL
  Filled 2014-12-17 (×3): qty 1

## 2014-12-17 MED ORDER — NAPROXEN 250 MG PO TABS
250.0000 mg | ORAL_TABLET | Freq: Two times a day (BID) | ORAL | Status: AC
  Administered 2014-12-17 – 2014-12-18 (×2): 250 mg via ORAL
  Filled 2014-12-17 (×2): qty 1

## 2014-12-17 MED ORDER — IBUPROFEN 400 MG PO TABS
400.0000 mg | ORAL_TABLET | Freq: Once | ORAL | Status: AC
  Administered 2014-12-17: 400 mg via ORAL
  Filled 2014-12-17: qty 1

## 2014-12-17 MED ORDER — MORPHINE SULFATE (PF) 2 MG/ML IV SOLN
2.0000 mg | INTRAVENOUS | Status: DC | PRN
  Administered 2014-12-17 – 2014-12-19 (×6): 2 mg via INTRAVENOUS
  Filled 2014-12-17 (×6): qty 1
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
## 2014-12-17 NOTE — Progress Notes (Signed)
Pt complaining of headache. Has recieved 1 dose of tylenol and 1 dose of ultram PRN with no relief. Paged to [facility] Medicine for notification. Will continue to monitor. Adah Salvage, RN

## 2014-12-17 NOTE — Progress Notes (Signed)
FPTS Interim Progress Note  Nursing paged stating patient is refusing heparin and SCDs. Nursing has informed patient of why these are important and patient continues to refuse.  Elberta Leatherwood, MD 12/17/2014, 4:35 PM PGY-2, [place] Medicine Service pager [phone]

## 2014-12-17 NOTE — Consult Note (Signed)
WOC wound consult note Reason for Consult:Diabetic foot ulceration-plantar aspect of foot, second metatarsal head Wound type:Neuropathic Pressure Ulcer POA: No Measurement:0.5cm round x 1.0cm deep Wound bed: unable to full inspect, cotton tipped applicator comes out blood tinged following assessment for depth Drainage (amount, consistency, odor) light yellow exudate (dried) on old dressing Periwound: Darker discoloration of plantar aspect of foot from the ulcer distally to base of second metatarsal head is noted.  Dressing procedure/placement/frequency: I will provide orders for nursing staff for conservative topical care of this ulcer using an impregnated iodoform gauze packing strip to wick out exudate and donate some antimicrobial properties.  It may be helpful to consult with orthopedics to determine plan of care and additional oversight of this patient who will require off-loading and on going evaluation of the affected 2nd digit.  If you agree, please order/consult with their service. Thank you for this consultation. The [facility] nursing team will not follow, but will remain available to this patient, the nursing and medical teams.  Please re-consult if needed. Thanks, Maudie Flakes, MSN, RN, [place], [place], [place] [phone])

## 2014-12-17 NOTE — Progress Notes (Signed)
Family Medicine Teaching Service Daily Progress Note Intern Pager: [phone]  Patient name: Ray Hall Medical record number: [id number] Date of birth: July 13, 1960 Age: 54 y.o. Gender: male  Primary Care Provider: Tawanna Sat, MD Consultants: WOC Code Status: Full  Pt Overview and Major Events to Date:  8/19: Admitted for infected diabetic foot ulcer, possibly osteomyelitis. Vanc/zosyn started.  8/20: MRI results negative for osteomyelitis   Assessment and Plan: Ray Hall is a 54 y.o. male presenting from [facility] with worsening diabetic left foot ulcer with systemic symptoms, having failed outpatient oral antibiotic therapy. PMH is significant for T2DM, HTN, chronic low back pain.   SIRS secondary to infected diabetic foot ulcer: With leukocytosis, tachycardia, lactate elevation and elevated ESR without signs or symptoms of shock. Significant concern for osteo: Neg XR, MRI now read as negative. Therapy since arrival 8/19 has resulted in modest improvements - Vancomycin and zosyn (8/19 >> )  - Blood Cx (8/19) NGTD - Appreciate assistance of WOC RN.  - Monitor CBC and vital signs - Will need diabetic shoes and ongoing counseling regarding foot care.  Troponin elevation: Very mild (0.10) in setting of sustained sinus tachycardia, likely demand ischemia, though he has risk factors for CAD. Denies chest pain/SOB. Not hypoxemic, no significant DVT/PE RF's.  - Trend troponin, recheck ECG - No heparin at this time - ASA, morphine prn, NTG prn  T2DM: Historically well controlled on oral medications, most recent A1c 7.4%. Only mildly hyperglycemic on presentation with low concern for HHNK.  - Hold metformin (likely contribution to lactate elevation) and glipizide - Start SSI/CBG AC/HS - Carbohydrate modified diet - Would benefit from ASA, ACE, and statin; consider ABIs as outpatient.   AKI: Mild, prerenal, improved with IVF's back to baseline. In setting of no known medical renal  disease.  - Continue adequate hydration in the setting of AKI, metformin use, and MRI contrast to limit risk of contrast nephropathy (low concern for NSF)  - Monitor BMP  Neuropathy: Multifactorial: Severity is not consistent with solely diabetic etiology (relatively well controlled DM x < 10 years). Vitamin B12 low at 146: ?pernicious anemia, history hgb baseline is 9 - 10 indices normocytic/normochromic.  - Start B12 IM (?malabsorption) here, defer work up to PCP - Continue gabapentin, consider titrating upwards (currently 173m TID) - Check folate - Monitor normocytic anemia  Rotator cuff tear: 2 months s/p repair, likely explanation for left sided neck pain and headache (very low suspicion for meningitis) - Continue home tylenol, tramadol, antispasmodic prn  Remote history of atrial fibrillation/flutter: Seems to have maintained sinus rhythm s/p ablation. Recent endorsement of nonanginal chest pain in setting of negative troponin.  - Telemetry is warranted until resolution of tachycardia/SIRS.  HTN: Well controlled without medications.   FEN/GI: Carb modified, NS @ 150cc/hr PPx: Subcutaneous heparin  Disposition: Continue monitoring, possible discharge in 1 - 2 days pending results and response to tx.  Subjective:  Ray Hall eating breakfast and had half a tKuwaitsandwich yesterday. Feels generally a little better this AM. Urinating about every 45 minutes. Denies chest pain, dyspnea.   Objective: Temp:  [99 F (37.2 C)-103.1 F (39.5 C)] 99.5 F (37.5 C) (08/20 0500) Pulse Rate:  [100-133] 111 (08/20 0500) Resp:  [12-29] 16 (08/20 0500) BP: (117-142)/(60-90) 119/79 mmHg (08/20 0500) SpO2:  [97 %-100 %] 97 % (08/20 0500) Weight:  [321 lb (145.605 kg)-322 lb 8.5 oz (146.3 kg)] 322 lb 8.5 oz (146.3 kg) (08/19 1944) Physical Exam: General: Obese,  pleasant 54 y.o. male in no distress Cardiovascular: Tachycardic, sinus tach with no ectopy noted on telemetry, decreased S1S2  without abnormality.  Respiratory: Nonlabored, normal rate, clear distant breath sounds. Abdomen: Benign Extremities: Left foot with ~ 1cm x 1cm ulcer overlying 1st MTP, with slough on wound bed without drainage. Surrounded by callus with limited erythema similar to previous exam. No visible bone. Sensation to light touch is absent bilaterally below the ankle. L foot and ankle with pitting edema, trace on right. Hyperpigmentation consistent with stasis bilaterally. DP pulses barely palpable. radial pulses 1+ bilaterally.   Laboratory:  Recent Labs Lab 12/16/14 1553 12/17/14 0426  WBC 19.0* 17.8*  HGB 10.3* 9.2*  HCT 29.0* 26.4*  PLT 240 259    Recent Labs Lab 12/16/14 1553 12/17/14 0426  NA 134* 136  K 4.5 3.8  CL 100* 105  CO2 20* 22  BUN 10 9  CREATININE 1.27* 0.97  CALCIUM 8.7* 8.1*  PROT 6.7  --   BILITOT 1.5*  --   ALKPHOS 100  --   ALT 18  --   AST 36  --   GLUCOSE 231* 126*    CXR (8/19) 1. Cardiomegaly, no CHF. 2. Low lung volumes with mild bibasilar subsegmental atelectasis.  XR left foot (8/19) No acute bony abnormality. No radiographic changes of osteomyelitis.  MRI (8/19)  Subcutaneous edema and enhancement about the left foot most consistent with cellulitis. Negative for abscess or osteomyelitis.  Patrecia Pour, MD 12/17/2014, 6:20 AM PGY-3, [place] Intern pager: [phone], text pages welcome

## 2014-12-17 NOTE — Progress Notes (Signed)
Admission note: patient admitted to the unit from [facility]  Arrival Method: stretcher Mental Orientation: alert and oriented x4 Telemetry: per MD order, CCMD notified Assessment: full assessment to epic Skin: diabetic ulcer on left foot IV: 2 on right forearm Pain: denies at this time Tubes: n/a Safety Measures: bed in low and locked position, call bed within reach Fall Prevention Safety Plan:  Admission Screening: completed [place] Orientation: Patient has been oriented to the unit, staff and to the room.

## 2014-12-17 NOTE — Progress Notes (Signed)
Utilization review completed.  

## 2014-12-18 DIAGNOSIS — Z8679 Personal history of other diseases of the circulatory system: Secondary | ICD-10-CM

## 2014-12-18 DIAGNOSIS — I10 Essential (primary) hypertension: Secondary | ICD-10-CM

## 2014-12-18 DIAGNOSIS — E08621 Diabetes mellitus due to underlying condition with foot ulcer: Secondary | ICD-10-CM

## 2014-12-18 DIAGNOSIS — E1140 Type 2 diabetes mellitus with diabetic neuropathy, unspecified: Secondary | ICD-10-CM

## 2014-12-18 LAB — BASIC METABOLIC PANEL
ANION GAP: 10 (ref 5–15)
BUN: 9 mg/dL (ref 6–20)
CALCIUM: 8 mg/dL — AB (ref 8.9–10.3)
CO2: 20 mmol/L — AB (ref 22–32)
CREATININE: 0.89 mg/dL (ref 0.61–1.24)
Chloride: 104 mmol/L (ref 101–111)
Glucose, Bld: 189 mg/dL — ABNORMAL HIGH (ref 65–99)
Potassium: 3.5 mmol/L (ref 3.5–5.1)
Sodium: 134 mmol/L — ABNORMAL LOW (ref 135–145)

## 2014-12-18 LAB — CBC
HEMATOCRIT: 22.6 % — AB (ref 39.0–52.0)
Hemoglobin: 7.9 g/dL — ABNORMAL LOW (ref 13.0–17.0)
MCH: 27.3 pg (ref 26.0–34.0)
MCHC: 35 g/dL (ref 30.0–36.0)
MCV: 78.2 fL (ref 78.0–100.0)
PLATELETS: 275 10*3/uL (ref 150–400)
RBC: 2.89 MIL/uL — ABNORMAL LOW (ref 4.22–5.81)
RDW: 13.9 % (ref 11.5–15.5)
WBC: 20 10*3/uL — AB (ref 4.0–10.5)

## 2014-12-18 LAB — GLUCOSE, CAPILLARY
GLUCOSE-CAPILLARY: 205 mg/dL — AB (ref 65–99)
GLUCOSE-CAPILLARY: 139 mg/dL — AB (ref 65–99)
GLUCOSE-CAPILLARY: 141 mg/dL — AB (ref 65–99)
Glucose-Capillary: 181 mg/dL — ABNORMAL HIGH (ref 65–99)

## 2014-12-18 LAB — URINE CULTURE

## 2014-12-18 MED ORDER — DIPHENHYDRAMINE HCL 50 MG/ML IJ SOLN
12.5000 mg | Freq: Once | INTRAMUSCULAR | Status: AC
  Administered 2014-12-18: 12.5 mg via INTRAVENOUS
  Filled 2014-12-18: qty 1

## 2014-12-18 MED ORDER — VANCOMYCIN HCL 10 G IV SOLR
1500.0000 mg | Freq: Two times a day (BID) | INTRAVENOUS | Status: DC
  Administered 2014-12-18: 1500 mg via INTRAVENOUS
  Filled 2014-12-18 (×3): qty 1500

## 2014-12-18 MED ORDER — PROCHLORPERAZINE EDISYLATE 5 MG/ML IJ SOLN
10.0000 mg | Freq: Four times a day (QID) | INTRAMUSCULAR | Status: DC | PRN
  Filled 2014-12-18: qty 2

## 2014-12-18 MED ORDER — CLINDAMYCIN HCL 300 MG PO CAPS: 300.0000 mg | ORAL_CAPSULE | Freq: Four times a day (QID) | ORAL | Status: DC

## 2014-12-18 MED ORDER — KETOROLAC TROMETHAMINE 30 MG/ML IJ SOLN
30.0000 mg | Freq: Once | INTRAMUSCULAR | Status: AC
  Administered 2014-12-18: 30 mg via INTRAVENOUS
  Filled 2014-12-18: qty 1

## 2014-12-18 MED ORDER — PROCHLORPERAZINE EDISYLATE 5 MG/ML IJ SOLN
10.0000 mg | Freq: Once | INTRAMUSCULAR | Status: AC
  Administered 2014-12-18: 10 mg via INTRAVENOUS
  Filled 2014-12-18: qty 2

## 2014-12-18 NOTE — Progress Notes (Signed)
Family Medicine Teaching Service Daily Progress Note Intern Pager: [phone]  Patient name: Ray Hall Medical record number: [id number] Date of birth: [birth year] Age: 54 y.o. Gender: male  Primary Care Provider: Tawanna Sat, MD Consultants: Ray Code Status: Full  Pt Overview and Major Events to Date:  8/19: Admitted for infected diabetic foot ulcer, possibly osteomyelitis. Vanc/zosyn started.  8/20: MRI results negative for osteomyelitis  8/20: IV Vanc and zosyn discontinued. PO clindamycin started.   Assessment and Plan: Ray Hall is a 54 y.o. male presenting from [facility] with worsening diabetic left foot ulcer with systemic symptoms, having failed outpatient oral antibiotic therapy. PMH is significant for T2DM, HTN, chronic low back pain.   SIRS secondary to infected diabetic foot ulcer: With leukocytosis, tachycardia, lactate elevation and elevated ESR without signs or symptoms of shock. Significant concern for osteo, but neg XR and MRI. Therapy since arrival 8/19 has resulted in modest improvements. Afebrile since 8/20.  - Vancomycin and zosyn (8/19 >>8/20)  - Started clindamycin 300 mg TID 12/17/14.  - Blood Cx (8/19) NGTD - Appreciate assistance of Ray Hall.  - Monitor CBC and vital signs - Will need diabetic shoes and ongoing counseling regarding foot care.  Troponin elevation: Very mild (0.10) in setting of sustained sinus tachycardia, likely demand ischemia, though he has risk factors for CAD. Denies chest pain/SOB. Not hypoxemic, no significant DVT/PE RF's.  - Troponins peaked at 0.10  - EKG with T waves abnormalities unchanged from prior - No heparin at this time - ASA, morphine prn, NTG prn  T2DM: Historically well controlled on oral medications, most recent A1c 7.4%. Only mildly hyperglycemic on presentation with low concern for HHNK.  - Hold metformin (likely contribution to lactate elevation) and glipizide - Start SSI/CBG AC/HS - Carbohydrate modified diet -  Would benefit from ASA, ACE, and statin; consider ABIs as outpatient.   AKI: Mild, prerenal, improved with IVF's back to baseline. In setting of no known medical renal disease.  - Monitor BMP  Neuropathy: Multifactorial: Severity is not consistent with solely diabetic etiology (relatively well controlled DM x < 10 years). Vitamin B12 low at 146: ?pernicious anemia, history hgb baseline is 9 - 10 indices normocytic/normochromic.  - Start B12 IM (?malabsorption) here, defer work up to PCP - Continue gabapentin, consider titrating upwards (currently 133m TID) - Folate 8.2 - Monitor normocytic anemia  Rotator cuff tear: 2 months s/p repair, likely explanation for left sided neck pain and headache (very low suspicion for meningitis) - Continue home tylenol, tramadol, antispasmodic prn  Remote history of atrial fibrillation/flutter: Seems to have maintained sinus rhythm s/p ablation. Recent endorsement of nonanginal chest pain in setting of negative troponin.  - On telemetry  HTN: Well controlled without medications.   Headache: Give one-time cocktail of toradol, diphenhydramine, and benadryl.  FEN/GI: Carb modified, IVFs discontinued. PPx: Subcutaneous heparin  Disposition: Afebrile on PO antibiotics. Likely discharge today.   Subjective:  Ray Hall still complaining of a headache for the past 4 days. He has not vomited since admission. He denies changes in vision or difficulty with balance or walking. Ibuprofen has helped the most. He describes the pain as bilateral and temporal that is dull but severe up to 10/10. He denies pain in his legs or feet but acknowledges his has limited sensation at baseline. His left nipple no longer is hurting.   Objective: Temp:  [98.3 F (36.8 C)-100.1 F (37.8 C)] 98.3 F (36.8 C) (08/21 0829) Pulse Rate:  [101-112]  101 (08/21 0829) Resp:  [16-18] 18 (08/21 0829) BP: (122-135)/(66-78) 129/76 mmHg (08/21 0829) SpO2:  [99 %-100 %] 99 % (08/21  0829) Weight:  [331 lb 5.6 oz (150.3 kg)] 331 lb 5.6 oz (150.3 kg) (08/20 2131) Physical Exam: General: Obese, pleasant 54 y.o. male in no distress Cardiovascular: Tachycardic, sinus tach with no ectopy noted on telemetry Respiratory: Nonlabored, mild bibasilar crackles Chest: Left nipple slightly indurated from 11-1 o'clock. Non-erythematous. Non-tender.  Abdomen: + BS, soft, nontender Extremities: Left foot with ~ 1cm x 1cm ulcer overlying 1st MTP, with clean, dry bandage in place. Sensation to light touch is absent bilaterally below the ankle. L foot and ankle with 3+ pitting edema, trace on right. Hyperpigmentation consistent with stasis bilaterally. DP pulses barely palpable.   Laboratory:  Recent Labs Lab 12/16/14 1553 12/17/14 0426  WBC 19.0* 17.8*  HGB 10.3* 9.2*  HCT 29.0* 26.4*  PLT 240 259    Recent Labs Lab 12/16/14 1553 12/17/14 0426  NA 134* 136  K 4.5 3.8  CL 100* 105  CO2 20* 22  BUN 10 9  CREATININE 1.27* 0.97  CALCIUM 8.7* 8.1*  PROT 6.7  --   BILITOT 1.5*  --   ALKPHOS 100  --   ALT 18  --   AST 36  --   GLUCOSE 231* 126*    CXR (8/19) 1. Cardiomegaly, no CHF. 2. Low lung volumes with mild bibasilar subsegmental atelectasis.  XR left foot (8/19) No acute bony abnormality. No radiographic changes of osteomyelitis.  MRI (8/19)  Subcutaneous edema and enhancement about the left foot most consistent with cellulitis. Negative for abscess or osteomyelitis.  Ray Bussing, MD 12/18/2014, 10:13 AM PGY-3, [place] Intern pager: [phone], text pages welcome

## 2014-12-18 NOTE — Progress Notes (Signed)
FPTS Interim Progress Note  Notified by RN of fever to 101.3  O: BP 149/81 mmHg  Pulse 107  Temp(Src) 101.3 F (38.5 C) (Oral)  Resp 18  Ht 6\' 3"  (1.905 m)  Wt 331 lb 12.7 oz (150.5 kg)  BMI 41.47 kg/m2  SpO2 96%    A/P: Diabetic foot ulcer infection: Concerning that WBC rising and febrile on PO Clinda that not adequately covered - Repeat BCx - D/c Clinda and resume Vanc per pharm - f/u WBC in AM - Tylenol prn for fever   Virginia Crews, MD 12/18/2014, 10:07 PM PGY-2, [place] Medicine Service pager [phone]

## 2014-12-18 NOTE — Progress Notes (Signed)
ANTIBIOTIC CONSULT NOTE - INITIAL  Pharmacy Consult for Vancomycin Indication: diabetic foot wound  No Known Allergies  Patient Measurements: Height: 6\' 3"  (190.5 cm) Weight: (!) 331 lb 12.7 oz (150.5 kg) IBW/kg (Calculated) : 84.5 Adjusted Body Weight:    Vital Signs: Temp: 101.3 F (38.5 C) (08/21 2100) Temp Source: Oral (08/21 2100) BP: 149/81 mmHg (08/21 2100) Pulse Rate: 107 (08/21 2100) Intake/Output from previous day: 08/20 0701 - 08/21 0700 In: 5360 [P.O.:1120; I.V.:4240] Out: 1000 [Urine:1000] Intake/Output from this shift:    Labs:  Recent Labs  12/16/14 1553 12/17/14 0426 12/18/14 1240  WBC 19.0* 17.8* 20.0*  HGB 10.3* 9.2* 7.9*  PLT 240 259 275  CREATININE 1.27* 0.97 0.89   Estimated Creatinine Clearance: 154 mL/min (by C-G formula based on Cr of 0.89). No results for input(s): VANCOTROUGH, VANCOPEAK, VANCORANDOM, GENTTROUGH, GENTPEAK, GENTRANDOM, TOBRATROUGH, TOBRAPEAK, TOBRARND, AMIKACINPEAK, AMIKACINTROU, AMIKACIN in the last 72 hours.   Microbiology: Recent Results (from the past 720 hour(s))  Blood culture (routine x 2)     Status: None (Preliminary result)   Collection Time: 12/16/14  4:20 PM  Result Value Ref Range Status   Specimen Description BLOOD RIGHT FOREARM  Final   Special Requests BOTTLES DRAWN AEROBIC AND ANAEROBIC 5CC  Final   Culture NO GROWTH 2 DAYS  Final   Report Status PENDING  Incomplete  Blood culture (routine x 2)     Status: None (Preliminary result)   Collection Time: 12/16/14  4:25 PM  Result Value Ref Range Status   Specimen Description BLOOD LEFT ARM  Final   Special Requests BOTTLES DRAWN AEROBIC AND ANAEROBIC 5CC  Final   Culture NO GROWTH 2 DAYS  Final   Report Status PENDING  Incomplete  Urine culture     Status: None   Collection Time: 12/16/14 10:29 PM  Result Value Ref Range Status   Specimen Description URINE, CLEAN CATCH  Final   Special Requests NONE  Final   Culture >=100,000 COLONIES/mL YEAST  Final    Report Status 12/18/2014 FINAL  Final    Medical History: Past Medical History  Diagnosis Date  . Sickle cell trait   . Normal nuclear stress test 07/2010    low prob of ischemia, EF 42%  . Echocardiogram abnormal     moderate LVH, mild LV hypokenesis, EF 42%  . History of Doppler ultrasound 07/2010    negative for DVT  . Abnormal CT of the abdomen     mild L hydronephrosis and hydroureter  . Status post radiofrequency ablation for arrhythmia 10/16/10    [facility] Howell Rucks MD)  . Diabetes mellitus   . Hypertension    Assessment:  Infectious Disease: Vanc/Zosyn for diabetic foot ulcer.  MRI was negative for osteo. Tmax/24h 101.1,  WBC 20 up. LA 3.64 on admit>>2.87.   8/19 vanc>>8/20, 8/21>> 8/19 zosyn>>8/20 8/20 Clinda>>8/21  8/19 BCx2>> 8/19 UC>>Yeast  Goal of Therapy:  Vancomycin trough level 15-20 mcg/ml  Plan:  Resume Vanc at 1500mg  IV q12h per obesity nomogram    Eather Chaires S. Alford Highland, PharmD, BCPS Clinical Staff Pharmacist Pager [phone]  Eilene Ghazi Stillinger 12/18/2014,9:56 PM

## 2014-12-18 NOTE — Discharge Summary (Signed)
[facility] Discharge Summary  Patient name: Ray Hall Medical record number: [id number] Date of birth: Mar 30, 1961 Age: 54 y.o. Gender: male Date of Admission: 12/16/2014  Date of Discharge: 12/20/2014 Admitting Physician: Dickie La, MD  Primary Care Provider: Tawanna Sat, MD Consultants: Wound Care  Indication for Hospitalization: infected diabetic foot ulcer  Discharge Diagnoses/Problem List:  - Left Diabetic Foot Ulcer - T2DM - Cluster Headache - Anemia - Diabetic Polyneuropathy - HTN - s/p l. Rotator cuff surgery in June - History of atrial flutter s/p ablation in 2012  Disposition: Home  Discharge Condition: Improved  Discharge Exam:  General: Obese, pleasant 54 y.o. male in no apparent distress Cardiovascular: RRR, S1, S2, no murmurs, rubs, gallops Respiratory: Nonlabored, mild bibasilar crackles Abdomen: + BS, soft, nontender Extremities: Left foot with < 0.5 cm ulcer overlying 1st MTP, with clean, dry bandage in place. Wick with minimal drainage (~5 in). Raw area <1 cm on dorsal surface of 2nd toe. Sensation to light touch is absent bilaterally below the ankle. L foot and ankle with 3+ pitting edema, none on right. However, swelling overall reduced. Hyperpigmentation consistent with stasis bilaterally. DP pulses nonpalpable but feet WWP.   Brief Hospital Course:  ALCARIO Hall is a 54 y.o. male who presented from [facility] with a worsening diabetic left foot ulcer with systemic symptoms, having failed outpatient oral antibiotic therapy on cipro and bactrim. He had nausea, vomiting, headache and rigors at clinic. PMH is significant for T2DM, HTN, chronic low back pain.   Given concern for osteomyelitis, left foot Xray and MRI were performed and showed no bony involvement. Vanc/zosyn was started initially (8/19) and switched to PO clindamycin on 8/20. Patient spiked a fever and WBC increased, so vanc was restarted on 8/21. HIV antibody  negative. He was transitioned to PO doxycyline on 8/22. Leukocytosis and headache were improving, and he remained afebrile. He was discharged with instructions to continue doxycyline through 01/01/15.  On admission, patient was complaining of chest pain that occurred with shaking and chills. Performed ACS rule-out. Troponin peaked at 0.10 thought to be due to demand ischemia secondary to sustained sinus tachycardia. EKG with T waves abnormalities unchanged from prior.  Hospitalization was complicated by anemia, headache, and AKI. Hgb was 10.3 on admission (down from 12.1 a month prior), and 7.4 at discharge. Suspect secondary to repeat phlebotomy. MCV 78, but B12 low at 146. Folate 8.2. Ferritin 829. AKI improved with fluids; by discharge Cr improved to 0.78, from Cr. 1.27 on admission. Headache responded to sumatriptan, after trying acetaminophen, NSAIDs, tramadol, and a headache cocktail.   Patient was started on lisinopril 5 mg, asa 81 mg and atorvastatin 40 mg for 10-year stroke risk of 8.6%.   Issues for Follow Up:  1. Anemia: Repeat CBC. Patient due for colonoscopy. Continue B12 supplementation.  2. Diabetic polyneuropathy: Patient needs diabetic shoes. Instructed to contact podiatry.  3. HTN: Started on 5 mg lisinopril on day of discharge. 4. Headache: If persistent, may consider topamax for prevention with benefit of weight loss. 5. Neuropathy: Consider titrating up gabapentin (currently at 100 mg TID). Consider obtaining ABIs.   Significant Procedures: None  Significant Labs and Imaging:   Recent Labs Lab 12/18/14 1240 12/19/14 0458 12/20/14 0538  WBC 20.0* 18.4* 16.4*  HGB 7.9* 7.9* 7.4*  HCT 22.6* 22.3* 21.3*  PLT 275 304 367    Recent Labs Lab 12/16/14 1553 12/17/14 0426 12/18/14 1240 12/19/14 0458 12/20/14 0538  NA 134* 136  134* 134* 135  K 4.5 3.8 3.5 3.6 3.5  CL 100* 105 104 103 105  CO2 20* 22 20* 21* 22  GLUCOSE 231* 126* 189* 102* 138*  BUN 10 9 9 10 9    CREATININE 1.27* 0.97 0.89 0.86 0.78  CALCIUM 8.7* 8.1* 8.0* 8.0* 8.3*  ALKPHOS 100  --   --   --   --   AST 36  --   --   --   --   ALT 18  --   --   --   --   ALBUMIN 3.1*  --   --   --   --    Dg Chest 2 View  12/16/2014   CLINICAL DATA:  Cough for 2 weeks.  Chest pain .  EXAM: CHEST  2 VIEW  COMPARISON:  03/02/2014 .  FINDINGS: Mediastinum and hilar structures normal. Cardiomegaly with normal pulmonary vascularity. Low lung volumes with mild bibasilar atelectasis. No pleural effusion or pneumothorax.  IMPRESSION: 1.  Cardiomegaly, no CHF.  2.  Low lung volumes with mild bibasilar subsegmental atelectasis.   Electronically Signed   By: Marcello Moores  Register   On: 12/16/2014 17:00   Mr Foot Left W Wo Contrast  12/17/2014   CLINICAL DATA:  Skin ulceration about the left first MTP joint for approximately 1 month in a diabetic patient. Pain and swelling. Fever and elevated white blood cell count. Subsequent encounter.  EXAM: MRI OF THE LEFT FOREFOOT WITHOUT AND WITH CONTRAST  TECHNIQUE: Multiplanar, multisequence MR imaging was performed both before and after administration of intravenous contrast.  CONTRAST:  20 mL MULTIHANCE GADOBENATE DIMEGLUMINE 529 MG/ML IV SOLN  COMPARISON:  Plain films left foot 12/16/2014 at 1637 hours. MRI left foot 08/29/2013.  FINDINGS: There is diffuse and intense subcutaneous edema about the foot which is worst dorsally with associated postcontrast enhancement of subcutaneous tissues consistent with cellulitis. No abscess is identified. No bone marrow signal abnormality to suggest osteomyelitis is seen. There is no joint effusion. Small intermediate signal intensity lesion in the base of the fourth metatarsal may be a cyst related to degenerative change or old trauma. A thin adjacent fluid collection lateral to the base of the fourth metatarsal without rim enhancement is compatible with a ganglion cyst.  IMPRESSION: Subcutaneous edema and enhancement about the left foot most  consistent with cellulitis. Negative for abscess or osteomyelitis.   Electronically Signed   By: Inge Rise M.D.   On: 12/17/2014 08:30   Dg Foot Complete Left  12/16/2014   CLINICAL DATA:  Left foot pain. Diabetic ulcer on bottom of left foot.  EXAM: LEFT FOOT - COMPLETE 3+ VIEW  COMPARISON:  11/09/2014  FINDINGS: No acute bony abnormality. Specifically, no fracture, subluxation, or dislocation. No radiographic changes of osteomyelitis.  IMPRESSION: No acute bony abnormality.   Electronically Signed   By: Rolm Baptise M.D.   On: 12/16/2014 17:01    Results/Tests Pending at Time of Discharge: Blood culture from 12/18/14, NG 3 days.   Discharge Medications:    Medication List    STOP taking these medications        ciprofloxacin 750 MG tablet  Commonly known as:  CIPRO     methocarbamol 750 MG tablet  Commonly known as:  ROBAXIN     naproxen sodium 220 MG tablet  Commonly known as:  ANAPROX     nitrofurantoin 50 MG capsule  Commonly known as:  MACRODANTIN     sulfamethoxazole-trimethoprim 800-160 MG per tablet  Commonly known as:  BACTRIM DS,SEPTRA DS      TAKE these medications        atorvastatin 40 MG tablet  Commonly known as:  LIPITOR  Take 1 tablet (40 mg total) by mouth daily at 6 PM.     BC FAST PAIN RELIEF ARTHRITIS 1000-65 MG Pack  Generic drug:  Aspirin-Caffeine  Take 1 Package by mouth daily as needed (for pain).     cyanocobalamin 1000 MCG tablet  Take 1 tablet (1,000 mcg total) by mouth daily.     cyclobenzaprine 10 MG tablet  Commonly known as:  FLEXERIL  TAKE ONE TABLET BY MOUTH THREE TIMES DAILY AS NEEDED FOR MUSCLE SPASM     diphenhydramine-acetaminophen 25-500 MG Tabs  Commonly known as:  TYLENOL PM  Take 2 tablets by mouth at bedtime.     doxycycline 100 MG tablet  Commonly known as:  VIBRA-TABS  Take 1 tablet (100 mg total) by mouth every 12 (twelve) hours.     gabapentin 100 MG capsule  Commonly known as:  NEURONTIN  Take 1 capsule  (100 mg total) by mouth 3 (three) times daily.     glipiZIDE 10 MG tablet  Commonly known as:  GLUCOTROL  TAKE ONE TABLET BY MOUTH TWICE DAILY BEFORE MEAL(S)     lisinopril 5 MG tablet  Commonly known as:  PRINIVIL,ZESTRIL  Take 1 tablet (5 mg total) by mouth daily.     metFORMIN 1000 MG tablet  Commonly known as:  GLUCOPHAGE  Take 1 tablet (1,000 mg total) by mouth 2 (two) times daily with a meal.     SUMAtriptan 50 MG tablet  Commonly known as:  IMITREX  Take 1 tablet (50 mg total) by mouth every 2 (two) hours as needed for migraine or headache. May repeat in 2 hours if headache persists or recurs. Do not exceed 3 doses in 24 hours.     traMADol 50 MG tablet  Commonly known as:  ULTRAM  Take 1 tablet (50 mg total) by mouth every 6 (six) hours as needed.       Discharge Instructions: Please refer to Patient Instructions section of EMR for full details.  Patient was counseled important signs and symptoms that should prompt return to medical care, changes in medications, dietary instructions, activity restrictions, and follow up appointments.   Follow-Up Appointments: Follow-up Information    Follow up with Lupita Dawn, MD. Go on 12/23/2014.   Specialty:  Family Medicine   Why:  2:30 PM appointment for hospital follow-up   Contact information:   [place] [phone]       Rogue Bussing, MD 12/20/2014, 3:54 PM PGY-1, [place]

## 2014-12-19 DIAGNOSIS — M869 Osteomyelitis, unspecified: Secondary | ICD-10-CM

## 2014-12-19 LAB — BASIC METABOLIC PANEL
Anion gap: 10 (ref 5–15)
BUN: 10 mg/dL (ref 6–20)
CALCIUM: 8 mg/dL — AB (ref 8.9–10.3)
CHLORIDE: 103 mmol/L (ref 101–111)
CO2: 21 mmol/L — AB (ref 22–32)
CREATININE: 0.86 mg/dL (ref 0.61–1.24)
GFR calc Af Amer: >60 mL/min (ref >60)
GFR calc non Af Amer: >60 mL/min (ref >60)
Glucose, Bld: 102 mg/dL — ABNORMAL HIGH (ref 65–99)
Potassium: 3.6 mmol/L (ref 3.5–5.1)
SODIUM: 134 mmol/L — AB (ref 135–145)

## 2014-12-19 LAB — GLUCOSE, CAPILLARY
GLUCOSE-CAPILLARY: 93 mg/dL (ref 65–99)
GLUCOSE-CAPILLARY: 154 mg/dL — AB (ref 65–99)
GLUCOSE-CAPILLARY: 158 mg/dL — AB (ref 65–99)
Glucose-Capillary: 126 mg/dL — ABNORMAL HIGH (ref 65–99)

## 2014-12-19 LAB — CBC
HEMATOCRIT: 22.3 % — AB (ref 39.0–52.0)
HEMOGLOBIN: 7.9 g/dL — AB (ref 13.0–17.0)
MCH: 27.3 pg (ref 26.0–34.0)
MCHC: 35.4 g/dL (ref 30.0–36.0)
MCV: 77.2 fL — ABNORMAL LOW (ref 78.0–100.0)
Platelets: 304 10*3/uL (ref 150–400)
RBC: 2.89 MIL/uL — ABNORMAL LOW (ref 4.22–5.81)
RDW: 13.9 % (ref 11.5–15.5)
WBC: 18.4 10*3/uL — ABNORMAL HIGH (ref 4.0–10.5)

## 2014-12-19 LAB — FERRITIN: Ferritin: 829 ng/mL — ABNORMAL HIGH (ref 24–336)

## 2014-12-19 MED ORDER — ENOXAPARIN SODIUM 40 MG/0.4ML ~~LOC~~ SOLN
40.0000 mg | SUBCUTANEOUS | Status: DC
  Administered 2014-12-19: 40 mg via SUBCUTANEOUS

## 2014-12-19 MED ORDER — DOXYCYCLINE HYCLATE 100 MG PO TABS
100.0000 mg | ORAL_TABLET | Freq: Two times a day (BID) | ORAL | Status: DC
  Administered 2014-12-19 – 2014-12-20 (×3): 100 mg via ORAL
  Filled 2014-12-19 (×3): qty 1

## 2014-12-19 MED ORDER — VITAMIN B-12 1000 MCG PO TABS: 1000.0000 ug | ORAL_TABLET | Freq: Every day | ORAL | Status: DC

## 2014-12-19 MED ORDER — VITAMIN B-12 1000 MCG PO TABS
1000.0000 ug | ORAL_TABLET | Freq: Every day | ORAL | Status: DC
Start: 2014-12-20 — End: 2014-12-20
  Administered 2014-12-20: 1000 ug via ORAL
  Filled 2014-12-19: qty 1

## 2014-12-19 MED ORDER — SUMATRIPTAN SUCCINATE 50 MG PO TABS
50.0000 mg | ORAL_TABLET | ORAL | Status: DC | PRN
  Administered 2014-12-19 (×2): 50 mg via ORAL
  Filled 2014-12-19 (×4): qty 1

## 2014-12-19 NOTE — Progress Notes (Signed)
Family Medicine Teaching Service Daily Progress Note Intern Pager: [phone]  Patient name: Ray Hall Medical record number: [id number] Date of birth: 1960/09/30 Age: 54 y.o. Gender: male  Primary Care Provider: Tawanna Sat, MD Consultants: WOC Code Status: Full  Pt Overview and Major Events to Date:  8/19: Admitted for infected diabetic foot ulcer, possibly osteomyelitis. Vanc/zosyn started.  8/20: MRI results negative for osteomyelitis  8/20: IV Vanc and zosyn discontinued. PO clindamycin started.  8/21: IV Vanc restarted, PO clindamycin discontinued  Assessment and Plan: Ray Hall is a 54 y.o. male presenting from [facility] with worsening diabetic left foot ulcer with systemic symptoms, having failed outpatient oral antibiotic therapy. PMH is significant for T2DM, HTN, chronic low back pain. Continues to have leukocytosis and headache.  SIRS secondary to infected diabetic foot ulcer: Spiked a fever to 101.3 overnight, still with leukocytosis, tachycardia. Presented with lactate elevation and elevated ESR without signs or symptoms of shock. Significant concern for osteo, but neg XR and MRI. Therapy since arrival 8/19 has resulted in modest improvements. Remains afebrile on IV vanc.  - Continue IV vancomycin. Consider transition to PO tomorrow, 8/23.  - Blood Cx (8/19) NGTD (2 days) - Appreciate assistance of WOC RN.  - Monitor CBC and vital signs - Will need diabetic shoes and ongoing counseling regarding foot care.  T2DM: Historically well controlled on oral medications, most recent A1c 7.4%. Only mildly hyperglycemic on presentation with low concern for HHNK. CBGs in the low 100s.  - Hold metformin (likely contribution to lactate elevation) and glipizide - Start SSI/CBG AC/HS - Carbohydrate modified diet - Would benefit from ASA, ACE, and statin; consider ABIs as outpatient.   HTN: Increased with systolic BPs in the 893Y.  - Consider starting lisinopril.   Headache:  Still present. Worsening according to patient. Was sedated by one-time cocktail of toradol, diphenhydramine and benadryl yesterday but headache was still present. No known tick exposure. No nausea/vomiting since day of admission. Mucus membranes slightly tacky. Possibly secondary to dehydration/increased BPs.  - Consider starting doxycyline for prophylaxis.  - Consider increasing [facility] from [facility].   AKI: Mild, prerenal, improved with IVF's back to baseline. In setting of no known medical renal disease.  - Monitor BMP. Cr 0.86 yesterday.   Neuropathy: Multifactorial: Severity is not consistent with solely diabetic etiology (relatively well controlled DM x < 10 years). Vitamin B12 low at 146: ?pernicious anemia, history hgb baseline is 9 - 10 indices normocytic/normochromic.  - Start B12 IM (?malabsorption) here, defer work up to PCP - Continue gabapentin, consider titrating upwards (currently 163m TID) - Folate 8.2 - Monitor normocytic anemia  Troponin elevation: Very mild (0.10) in setting of sustained sinus tachycardia, likely demand ischemia, though he has risk factors for CAD. Denies chest pain/SOB. Not hypoxemic, no significant DVT/PE RF's.  - Troponins peaked at 0.10  - EKG with T waves abnormalities unchanged from prior - No heparin at this time - ASA, morphine prn, NTG prn  Rotator cuff tear: 2 months s/p repair, likely explanation for left sided neck pain and headache (very low suspicion for meningitis) - Continue home tylenol, tramadol, antispasmodic prn  Remote history of atrial fibrillation/flutter: Seems to have maintained sinus rhythm s/p ablation. Recent endorsement of nonanginal chest pain in setting of negative troponin.  - On telemetry  FEN/GI: Carb modified, IVFs discontinued. PPx: Subcutaneous heparin  Disposition: Afebrile on PO antibiotics. Likely discharge today.   Subjective:  Ray Hall still complaining of a headache. It is  associated with tearing causing  blurry vision. He has not vomited since admission. The pain as bilateral and temporal that is throbbing and occasionally sharp to 10/10. He denies pain in his legs or feet but acknowledges his has limited sensation at baseline. His left nipple no longer is hurting.   Objective: Temp:  [98.3 F (36.8 C)-101.3 F (38.5 C)] 99.5 F (37.5 C) (08/22 0500) Pulse Rate:  [94-107] 100 (08/22 0500) Resp:  [18] 18 (08/22 0500) BP: (121-149)/(65-93) 143/93 mmHg (08/22 0500) SpO2:  [96 %-100 %] 100 % (08/22 0500) Weight:  [331 lb 12.7 oz (150.5 kg)] 331 lb 12.7 oz (150.5 kg) (08/21 2100) Physical Exam: General: Obese, pleasant 54 y.o. male in mild distress Cardiovascular: Tachycardic, sinus tach with no ectopy noted on telemetry Respiratory: Nonlabored, mild bibasilar crackles Abdomen: + BS, soft, nontender Extremities: Left foot with < 0.5 cm ulcer overlying 1st MTP, with clean, dry bandage in place. Wick with minimal drainage. Sensation to light touch is absent bilaterally below the ankle. L foot and ankle with 3+ pitting edema, trace on right. Hyperpigmentation consistent with stasis bilaterally. DP pulses nonpalpable but feet WWP.   Laboratory:  Recent Labs Lab 12/17/14 0426 12/18/14 1240 12/19/14 0458  WBC 17.8* 20.0* 18.4*  HGB 9.2* 7.9* 7.9*  HCT 26.4* 22.6* 22.3*  PLT 259 275 304    Recent Labs Lab 12/16/14 1553 12/17/14 0426 12/18/14 1240 12/19/14 0458  NA 134* 136 134* 134*  K 4.5 3.8 3.5 3.6  CL 100* 105 104 103  CO2 20* 22 20* 21*  BUN 10 9 9 10   CREATININE 1.27* 0.97 0.89 0.86  CALCIUM 8.7* 8.1* 8.0* 8.0*  PROT 6.7  --   --   --   BILITOT 1.5*  --   --   --   ALKPHOS 100  --   --   --   ALT 18  --   --   --   AST 36  --   --   --   GLUCOSE 231* 126* 189* 102*    CXR (8/19) 1. Cardiomegaly, no CHF. 2. Low lung volumes with mild bibasilar subsegmental atelectasis.  XR left foot (8/19) No acute bony abnormality. No radiographic changes of osteomyelitis.  MRI  (8/19)  Subcutaneous edema and enhancement about the left foot most consistent with cellulitis. Negative for abscess or osteomyelitis.  Rogue Bussing, MD 12/19/2014, 7:30 AM PGY-3, [place] Intern pager: [phone], text pages welcome

## 2014-12-20 LAB — BASIC METABOLIC PANEL
Anion gap: 8 (ref 5–15)
BUN: 9 mg/dL (ref 6–20)
CHLORIDE: 105 mmol/L (ref 101–111)
CO2: 22 mmol/L (ref 22–32)
CREATININE: 0.78 mg/dL (ref 0.61–1.24)
Calcium: 8.3 mg/dL — ABNORMAL LOW (ref 8.9–10.3)
GFR calc non Af Amer: >60 mL/min (ref >60)
Glucose, Bld: 138 mg/dL — ABNORMAL HIGH (ref 65–99)
POTASSIUM: 3.5 mmol/L (ref 3.5–5.1)
SODIUM: 135 mmol/L (ref 135–145)

## 2014-12-20 LAB — CBC WITH DIFFERENTIAL/PLATELET
Basophils Absolute: 0 10*3/uL (ref 0.0–0.1)
Basophils Relative: 0 % (ref 0–1)
EOS PCT: 1 % (ref 0–5)
Eosinophils Absolute: 0.2 10*3/uL (ref 0.0–0.7)
HEMATOCRIT: 21.3 % — AB (ref 39.0–52.0)
HEMOGLOBIN: 7.4 g/dL — AB (ref 13.0–17.0)
LYMPHS ABS: 2.3 10*3/uL (ref 0.7–4.0)
LYMPHS PCT: 14 % (ref 12–46)
MCH: 27.2 pg (ref 26.0–34.0)
MCHC: 34.7 g/dL (ref 30.0–36.0)
MCV: 78.3 fL (ref 78.0–100.0)
MONOS PCT: 12 % (ref 3–12)
Monocytes Absolute: 2 10*3/uL — ABNORMAL HIGH (ref 0.1–1.0)
NEUTROS ABS: 11.9 10*3/uL — AB (ref 1.7–7.7)
Neutrophils Relative %: 73 % (ref 43–77)
Platelets: 367 10*3/uL (ref 150–400)
RBC: 2.72 MIL/uL — ABNORMAL LOW (ref 4.22–5.81)
RDW: 14.5 % (ref 11.5–15.5)
WBC MORPHOLOGY: INCREASED
WBC: 16.4 10*3/uL — ABNORMAL HIGH (ref 4.0–10.5)

## 2014-12-20 LAB — GLUCOSE, CAPILLARY
GLUCOSE-CAPILLARY: 128 mg/dL — AB (ref 65–99)
Glucose-Capillary: 237 mg/dL — ABNORMAL HIGH (ref 65–99)

## 2014-12-20 LAB — HIV ANTIBODY (ROUTINE TESTING W REFLEX): HIV Screen 4th Generation wRfx: NONREACTIVE

## 2014-12-20 MED ORDER — ASPIRIN 81 MG PO CHEW
81.0000 mg | CHEWABLE_TABLET | Freq: Once | ORAL | Status: AC
  Administered 2014-12-20: 81 mg via ORAL
  Filled 2014-12-20: qty 1

## 2014-12-20 MED ORDER — ATORVASTATIN CALCIUM 40 MG PO TABS: 40.0000 mg | ORAL_TABLET | Freq: Every day | ORAL | Status: DC

## 2014-12-20 MED ORDER — ENOXAPARIN SODIUM 80 MG/0.8ML ~~LOC~~ SOLN: 80.0000 mg | SUBCUTANEOUS | Status: DC

## 2014-12-20 MED ORDER — CYANOCOBALAMIN 1000 MCG PO TABS: 1000.0000 ug | ORAL_TABLET | Freq: Every day | ORAL | Status: DC

## 2014-12-20 MED ORDER — LISINOPRIL 5 MG PO TABS
5.0000 mg | ORAL_TABLET | Freq: Every day | ORAL | Status: DC
Start: 2014-12-20 — End: 2014-12-20
  Administered 2014-12-20: 5 mg via ORAL
  Filled 2014-12-20: qty 1

## 2014-12-20 MED ORDER — LISINOPRIL 5 MG PO TABS: 5.0000 mg | ORAL_TABLET | Freq: Every day | ORAL | Status: DC

## 2014-12-20 MED ORDER — DOXYCYCLINE HYCLATE 100 MG PO TABS: 100.0000 mg | ORAL_TABLET | Freq: Two times a day (BID) | ORAL | Status: DC

## 2014-12-20 MED ORDER — SUMATRIPTAN SUCCINATE 50 MG PO TABS: 50.0000 mg | ORAL_TABLET | ORAL | Status: DC | PRN

## 2014-12-20 NOTE — Progress Notes (Signed)
Family Medicine Teaching Service Daily Progress Note Intern Pager: [phone]  Patient name: Ray Hall Medical record number: [id number] Date of birth: [birth year] Age: 54 y.o. Gender: male  Primary Care Provider: Tawanna Sat, MD Consultants: WOC Code Status: Full  Pt Overview and Major Events to Date:  8/19: Admitted for infected diabetic foot ulcer, possibly osteomyelitis. Vanc/zosyn started.  8/20: MRI results negative for osteomyelitis  8/20: IV Vanc and zosyn discontinued. PO clindamycin started.  8/21: IV Vanc restarted, PO clindamycin discontinued 8/22: IV Vanc discontinued, PO doxycycline started.   Assessment and Plan: Ray Hall is a 55 y.o. male presenting from [facility] with worsening diabetic left foot ulcer with systemic symptoms, having failed outpatient oral antibiotic therapy. PMH is significant for T2DM, HTN, chronic low back pain. Leukocytosis decreasing, headache improving, and afebrile.  SIRS secondary to infected diabetic foot ulcer: Afebrile, still with leukocytosis, tachycardia. Presented with lactate elevation and elevated ESR without signs or symptoms of shock. Significant concern for osteo, but neg XR and MRI.  - Continue PO doxycline.  - WBC 16. 4 << 18.4 < 20.0 - Blood Cx (8/19) NGTD (3 days) - Appreciate assistance of WOC RN.  - Monitor CBC and vital signs - Will need diabetic shoes and ongoing counseling regarding foot care. - Consider ABIs as outpatient  LE edema - Keep left foot elevated  T2DM: Historically well controlled on oral medications, most recent A1c 7.4%. Only mildly hyperglycemic on presentation with low concern for HHNK. CBGs in the low 100s.  - Hold metformin (likely contribution to lactate elevation) and glipizide - Start SSI/CBG AC/HS - Carbohydrate modified diet - Would benefit from ASA, ACE, and statin (10 year stroke risk is 8.6%); ASA changed to 81 mg, discharge on 40 mg atorvastatin and 5 mg lisinopril.   Headache:  Improved after sumatriptan. No known tick exposure. No nausea/vomiting since day of admission. Recent rotator cuff surgery.  - Started on doxycyline 8/22, which would cover for tick-borne illnesses.   Anemia: 7.4 << 7.9 < 7.9 < 9.2 Presented at 10.3, down from 12.1 a month prior to current admission. Likely secondary to repeat phlebotomy.  - Ferritin 829  HTN: Increased with systolic BPs to the 916B. Pain from headache may be contributing.  - Start lisinopril 5 mg.   AKI: Resolved. Mild, prerenal, improved with IVF's back to baseline. In setting of no known medical renal disease.  - Cr 0.78.   Neuropathy: Multifactorial: Severity is not consistent with solely diabetic etiology (relatively well controlled DM x < 10 years). Vitamin B12 low at 146: ?pernicious anemia, history hgb baseline is 9 - 10 indices normocytic/normochromic.  - Continue B12 1,000 mcg PO daily.  - Continue gabapentin, consider titrating upwards (currently 149m TID) - Folate 8.2 - Monitor normocytic anemia  Troponin elevation: Very mild (0.10) in setting of sustained sinus tachycardia, likely demand ischemia, though he has risk factors for CAD. Denies chest pain/SOB. Not hypoxemic, no significant DVT/PE RF's.  - Troponins peaked at 0.10  - EKG with T waves abnormalities unchanged from prior - Morphine prn, NTG prn - ASA switched to 81 mg from 325 mg for prevention vs ACS  Rotator cuff tear: 2 months s/p repair, likely explanation for left sided neck pain and headache (very low suspicion for meningitis) - Continue home tylenol, tramadol, antispasmodic prn  Remote history of atrial fibrillation/flutter: Seems to have maintained sinus rhythm s/p ablation. Recent endorsement of nonanginal chest pain in setting of negative troponin.   FEN/GI: Carb  modified, IVFs discontinued. PPx: Subcutaneous heparin  Disposition: Admitted to [facility] Medicine Teaching Service to treat infection. Spiked fever after first trial of PO, now  trialing PO doxy. Remains afebrile. Anticipate discharge today.   Subjective:  Ray Hall had an incident with is left foot overnight. He was walking to the bathroom when a puddle formed under his foot. It was blood, and when a nurse came in to help him, blood "squirted 3 feet in the air" from his 2nd toe. Patient says the nurse was squeezing the ball of his foot near the wound when this happened. Headache went away after triptan. Now at a 6-7/10 about 10 hours after last dose.   Objective: Temp:  [98.2 F (36.8 C)-100.1 F (37.8 C)] 99 F (37.2 C) (08/23 0500) Pulse Rate:  [85-105] 85 (08/23 0500) Resp:  [16-18] 16 (08/23 0500) BP: (139-153)/(81-91) 139/81 mmHg (08/23 0500) SpO2:  [100 %] 100 % (08/23 0500) Weight:  [334 lb 3.5 oz (151.6 kg)] 334 lb 3.5 oz (151.6 kg) (08/22 1900) Physical Exam: General: Obese, pleasant 54 y.o. male in no apparent distress Cardiovascular: RRR, S1, S2, no murmurs, rubs, gallops Respiratory: Nonlabored, mild bibasilar crackles Abdomen: + BS, soft, nontender Extremities: Left foot with < 0.5 cm ulcer overlying 1st MTP, with clean, dry bandage in place. Wick with minimal drainage (~5 in). Raw area <1 cm on dorsal surface of 2nd toe. Sensation to light touch is absent bilaterally below the ankle. L foot and ankle with 3+ pitting edema, none on right. However, swelling overall reduced. Hyperpigmentation consistent with stasis bilaterally. DP pulses nonpalpable but feet WWP.   Laboratory:  Recent Labs Lab 12/17/14 0426 12/18/14 1240 12/19/14 0458  WBC 17.8* 20.0* 18.4*  HGB 9.2* 7.9* 7.9*  HCT 26.4* 22.6* 22.3*  PLT 259 275 304    Recent Labs Lab 12/16/14 1553  12/18/14 1240 12/19/14 0458 12/20/14 0538  NA 134*  < > 134* 134* 135  K 4.5  < > 3.5 3.6 3.5  CL 100*  < > 104 103 105  CO2 20*  < > 20* 21* 22  BUN 10  < > 9 10 9   CREATININE 1.27*  < > 0.89 0.86 0.78  CALCIUM 8.7*  < > 8.0* 8.0* 8.3*  PROT 6.7  --   --   --   --   BILITOT 1.5*  --    --   --   --   ALKPHOS 100  --   --   --   --   ALT 18  --   --   --   --   AST 36  --   --   --   --   GLUCOSE 231*  < > 189* 102* 138*  < > = values in this interval not displayed.  CXR (8/19) 1. Cardiomegaly, no CHF. 2. Low lung volumes with mild bibasilar subsegmental atelectasis.  XR left foot (8/19) No acute bony abnormality. No radiographic changes of osteomyelitis.  MRI (8/19)  Subcutaneous edema and enhancement about the left foot most consistent with cellulitis. Negative for abscess or osteomyelitis.  Rogue Bussing, MD 12/20/2014, 7:19 AM PGY-1, [place] Intern pager: [phone], text pages welcome

## 2014-12-20 NOTE — Progress Notes (Signed)
Patient discharge teaching given, including activity, diet, follow-up appoints, dressing change and medications. Patient verbalized understanding of all discharge instructions. IV access was d/c'd. Vitals are stable. Skin is intact except as charted in most recent assessments. Pt to be escorted out by NT, to be driven home by family.  Jillyn Ledger, MBA, BS, RN

## 2014-12-20 NOTE — Progress Notes (Signed)
UR COMPLETED  

## 2014-12-20 NOTE — Discharge Instructions (Signed)
Ray Hall, you were admitted for concern over a non-healing left foot ulcer. On both X-ray and MRI of your foot, there was no involvement of the bone (osteomyelitis). You were given IV antibiotics then switched to PO doxycycline 100 mg twice daily to treat your soft tissue infection. It is very important that you complete the entire course of medicine through 01/01/15. The wound care team recommends rinsing your left foot ulcer daily with water then gently patting dry. Cover with gauze and tape. Elevate left foot on a pillow while in bed.  Please seek medical attention if you have a temperature above 100.4 F, chills, vomiting or increased drainage from your wound.   For chest pain, EKG and cardiac enzymes ruled out an acute process.   For headache, we will prescribe sumatriptan. Please take this medication sparingly. Do not take this medication more than 3 times a day and no more than three times a week, and you must wait at least 2 hours in between doses. You may also find relief with ibuprofen. Sumatriptan is an expensive medication, so if headaches continue, we recommend trying a medication for prevention instead.   To prevent risk of cardiovascular disease, please take 5 mg lisinopril, 81 mg aspirin, and 40 mg atorvastatin daily.  We also recommend follow-up with a podiatrist for diabetic shoes.    Diabetes and Foot Care Diabetes may cause you to have problems because of poor blood supply (circulation) to your feet and legs. This may cause the skin on your feet to become thinner, break easier, and heal more slowly. Your skin may become dry, and the skin may peel and crack. You may also have nerve damage in your legs and feet causing decreased feeling in them. You may not notice minor injuries to your feet that could lead to infections or more serious problems. Taking care of your feet is one of the most important things you can do for yourself.  HOME CARE INSTRUCTIONS  Wear shoes at all times,  even in the house. Do not go barefoot. Bare feet are easily injured.  Check your feet daily for blisters, cuts, and redness. If you cannot see the bottom of your feet, use a mirror or ask someone for help.  Wash your feet with warm water (do not use hot water) and mild soap. Then pat your feet and the areas between your toes until they are completely dry. Do not soak your feet as this can dry your skin.  Apply a moisturizing lotion or petroleum jelly (that does not contain alcohol and is unscented) to the skin on your feet and to dry, brittle toenails. Do not apply lotion between your toes.  Trim your toenails straight across. Do not dig under them or around the cuticle. File the edges of your nails with an emery board or nail file.  Do not cut corns or calluses or try to remove them with medicine.  Wear clean socks or stockings every day. Make sure they are not too tight. Do not wear knee-high stockings since they may decrease blood flow to your legs.  Wear shoes that fit properly and have enough cushioning. To break in new shoes, wear them for just a few hours a day. This prevents you from injuring your feet. Always look in your shoes before you put them on to be sure there are no objects inside.  Do not cross your legs. This may decrease the blood flow to your feet.  If you find a minor  scrape, cut, or break in the skin on your feet, keep it and the skin around it clean and dry. These areas may be cleansed with mild soap and water. Do not cleanse the area with peroxide, alcohol, or iodine.  When you remove an adhesive bandage, be sure not to damage the skin around it.  If you have a wound, look at it several times a day to make sure it is healing.  Do not use heating pads or hot water bottles. They may burn your skin. If you have lost feeling in your feet or legs, you may not know it is happening until it is too late.  Make sure your health care provider performs a complete foot exam at  least annually or more often if you have foot problems. Report any cuts, sores, or bruises to your health care provider immediately. SEEK MEDICAL CARE IF:   You have an injury that is not healing.  You have cuts or breaks in the skin.  You have an ingrown nail.  You notice redness on your legs or feet.  You feel burning or tingling in your legs or feet.  You have pain or cramps in your legs and feet.  Your legs or feet are numb.  Your feet always feel cold. SEEK IMMEDIATE MEDICAL CARE IF:   There is increasing redness, swelling, or pain in or around a wound.  There is a red line that goes up your leg.  Pus is coming from a wound.  You develop a fever or as directed by your health care provider.  You notice a bad smell coming from an ulcer or wound. Document Released: 04/12/2000 Document Revised: 12/16/2012 Document Reviewed: 09/22/2012 [facility] Patient Information 2015 [place], [place]. This information is not intended to replace advice given to you by your health care provider. Make sure you discuss any questions you have with your health care provider.

## 2014-12-21 LAB — CULTURE, BLOOD (ROUTINE X 2)
CULTURE: NO GROWTH
Culture: NO GROWTH

## 2014-12-23 ENCOUNTER — Encounter: Payer: Self-pay | Admitting: Family Medicine

## 2014-12-23 ENCOUNTER — Ambulatory Visit (INDEPENDENT_AMBULATORY_CARE_PROVIDER_SITE_OTHER): Payer: Self-pay | Admitting: Family Medicine

## 2014-12-23 VITALS — BP 136/82 | HR 100 | Temp 98.6°F | Ht 75.0 in | Wt 316.0 lb

## 2014-12-23 DIAGNOSIS — L97529 Non-pressure chronic ulcer of other part of left foot with unspecified severity: Secondary | ICD-10-CM

## 2014-12-23 DIAGNOSIS — D509 Iron deficiency anemia, unspecified: Secondary | ICD-10-CM | POA: Insufficient documentation

## 2014-12-23 DIAGNOSIS — I10 Essential (primary) hypertension: Secondary | ICD-10-CM

## 2014-12-23 DIAGNOSIS — R51 Headache: Secondary | ICD-10-CM

## 2014-12-23 DIAGNOSIS — D649 Anemia, unspecified: Secondary | ICD-10-CM

## 2014-12-23 DIAGNOSIS — E11621 Type 2 diabetes mellitus with foot ulcer: Secondary | ICD-10-CM

## 2014-12-23 DIAGNOSIS — R519 Headache, unspecified: Secondary | ICD-10-CM | POA: Insufficient documentation

## 2014-12-23 DIAGNOSIS — N179 Acute kidney failure, unspecified: Secondary | ICD-10-CM | POA: Insufficient documentation

## 2014-12-23 DIAGNOSIS — K219 Gastro-esophageal reflux disease without esophagitis: Secondary | ICD-10-CM | POA: Insufficient documentation

## 2014-12-23 LAB — CBC
HEMATOCRIT: 26.4 % — AB (ref 39.0–52.0)
Hemoglobin: 9 g/dL — ABNORMAL LOW (ref 13.0–17.0)
MCH: 27 pg (ref 26.0–34.0)
MCHC: 34.1 g/dL (ref 30.0–36.0)
MCV: 79.3 fL (ref 78.0–100.0)
MPV: 8.1 fL — ABNORMAL LOW (ref 8.6–12.4)
Platelets: 625 10*3/uL — ABNORMAL HIGH (ref 150–400)
RBC: 3.33 MIL/uL — AB (ref 4.22–5.81)
RDW: 15.8 % — ABNORMAL HIGH (ref 11.5–15.5)
WBC: 17.7 10*3/uL — ABNORMAL HIGH (ref 4.0–10.5)

## 2014-12-23 LAB — BASIC METABOLIC PANEL
BUN: 10 mg/dL (ref 7–25)
CHLORIDE: 99 mmol/L (ref 98–110)
CO2: 22 mmol/L (ref 20–31)
CREATININE: 0.79 mg/dL (ref 0.70–1.33)
Calcium: 9 mg/dL (ref 8.6–10.3)
Glucose, Bld: 100 mg/dL — ABNORMAL HIGH (ref 65–99)
POTASSIUM: 3.7 mmol/L (ref 3.5–5.3)
Sodium: 138 mmol/L (ref 135–146)

## 2014-12-23 LAB — CULTURE, BLOOD (ROUTINE X 2)
CULTURE: NO GROWTH
Culture: NO GROWTH

## 2014-12-23 MED ORDER — LISINOPRIL 20 MG PO TABS: 5.0000 mg | ORAL_TABLET | Freq: Every day | ORAL | Status: DC

## 2014-12-23 MED ORDER — RANITIDINE HCL 150 MG PO TABS: 150.0000 mg | ORAL_TABLET | Freq: Two times a day (BID) | ORAL | Status: DC

## 2014-12-23 MED ORDER — LISINOPRIL 20 MG PO TABS: 20.0000 mg | ORAL_TABLET | Freq: Every day | ORAL | Status: DC

## 2014-12-23 MED ORDER — SUMATRIPTAN SUCCINATE 50 MG PO TABS: 50.0000 mg | ORAL_TABLET | ORAL | Status: DC | PRN

## 2014-12-23 NOTE — Assessment & Plan Note (Signed)
Hospital follow-up for diabetic foot ulcer that extends from the plantar aspect of the foot to in between the first and second digits. Ulceration appears clean, clean dressing in place. No acute fevers to suggest worsening infection. -Continue doxycycline -Check CBC -Continue daily wound dressing changes -Patient will need ABI after wound has healed to evaluate for peripheral arterial disease -Return in one week for follow-up -Of note patient did report some associated nausea and dizziness, I suspect this is related to poor by mouth intake, encouraged aggressive oral fluid rehydration, return precautions discussed in detail with the patient and his wife

## 2014-12-23 NOTE — Assessment & Plan Note (Signed)
Patient found to have migraine type headaches while hospitalized. Patient reports improvement of symptoms with Imitrex. Of note patient is taking Imitrex daily since discharge. -Patient counseled to only use Imitrex for severe symptoms -I suspect that his multiple medical issues along with poor by mouth intake are contributing to his daily headaches  -Could consider imaging if symptoms persist

## 2014-12-23 NOTE — Assessment & Plan Note (Addendum)
Patient had acute kidney injury during hospitalization that resolved before discharge -Check serum creatinine to monitor for continued resolution

## 2014-12-23 NOTE — Progress Notes (Signed)
   Subjective:    Patient ID: Ray Hall, male    DOB: Jun 18, 1960, 54 y.o.   MRN: [id number]  HPI 54 year old male presents for hospital follow-up. Patient was admitted from 12/16/2014 07/20/2014 with diabetic ulceration of the left foot. Workup including x-rays and MRI was negative for osteomyelitis. Patient required IV anabiotic's and was eventually transitioned to by mouth doxycycline.  Diabetic foot ulceration - wife has been changing the dressing daily, no redness, mild drainage, patient denies fevers or chills  Hypertension - patient discharged on lisinopril 5 mg daily however he reports taking 20 mg daily, no chest pain, no vision changes  Headache-patient reports headaches are improved with as needed sumatriptan, he reports using on a daily basis since discharge  Elevated serum creatinine - patient had AKI during hospitalization, reports poor by mouth intake  Anemia - hemoglobin 10.3 in the hospital, patient denies bloody stools, patient declines referral to GI for colonoscopy at this time  Acid reflux - patient reports daily earning sensation that begins in the epigastric region and radiates to the back of his throat, he reports poor by mouth intake  Social-current every day smoker  Additional documentation - patient was discharged to lab, while in the lab he admitted to the phlebotomist that he was nauseated and dizzy, no associated chest pain, some associated shortness of breath, during my questioning he states that the symptoms only started since walking to the lab   Review of Systems  Constitutional: Positive for chills. Negative for fever.  Respiratory: Positive for shortness of breath. Negative for cough and chest tightness.   Cardiovascular: Positive for leg swelling. Negative for chest pain.  Gastrointestinal: Positive for nausea. Negative for vomiting and diarrhea.       Objective:   Physical Exam Vitals: Reviewed Gen.: Pleasant African-American male, no  acute distress, accompanied by his wife Cardiac: Tachycardic, S1 and S2 present, no murmurs, no heaves or thrills Respiratory: Clear to auscultation bilaterally, normal effort Abdomen: Soft, mild epigastric tenderness, normal bowel sounds Extremities: 2+ edema to the knee of the left lower extremity, removed bandage of the left lower extremity which identified open wound of the plantar aspect of the foot between the first and second digit with packing in place, serosanguineous drainage present, no surrounding erythema, ulceration also noted between the first and second toes with serosanguineous drainage, no increased warmth, bandage was replaced while in the office today      Assessment & Plan:  Please see problem specific assessment and plan.

## 2014-12-23 NOTE — Assessment & Plan Note (Signed)
Blood pressure controlled on 20 mg daily -Continue current therapy and monitor at subsequent visits

## 2014-12-23 NOTE — Patient Instructions (Signed)
It was nice to see you today.  Your foot appears to be healing, continue daily dressing changes. Continue Doxycycline.  Check lab work, Dr. Ree Kida will call you with your results.   Acid reflux - start zantac 150 twice daily  Headaches - only take Imitrex as needed for severe headache  Blood pressure - controlled, continue lisinopril 20 mg daily   Please return in one week for follow up.

## 2014-12-23 NOTE — Assessment & Plan Note (Signed)
Patient found to be anemic during hospitalization. Patient denies blood in his stool. Due for colonoscopy -Recheck CBC today -Patient declines referral for colonoscopy today

## 2014-12-23 NOTE — Assessment & Plan Note (Signed)
Patient reports symptoms consistent with acid reflux, I suspect that his symptoms are contributing to his poor oral intake -Start Zantac 150 mg by mouth twice a day

## 2014-12-24 ENCOUNTER — Telehealth: Payer: Self-pay | Admitting: Family Medicine

## 2014-12-24 ENCOUNTER — Other Ambulatory Visit: Payer: Self-pay | Admitting: Family Medicine

## 2014-12-24 DIAGNOSIS — E11621 Type 2 diabetes mellitus with foot ulcer: Secondary | ICD-10-CM

## 2014-12-24 DIAGNOSIS — L97529 Non-pressure chronic ulcer of other part of left foot with unspecified severity: Principal | ICD-10-CM

## 2014-12-24 NOTE — Telephone Encounter (Signed)
Called and discussed lab work with patient. BMP looks good. Scr in normal range. WBC still elevated.  Patient reports that when he got home last night he remained nauseated, had non-bloody emesis X1, however late last night and this morning he reports no further nausea or emesis, tolerating liquids, did eat a few french fries this AM, has not picked up Zantac but is currently on this way to pick it up, counseled patient on bland diet (toast, rice, jello, soft foods), offered nausea medication however patient elected to wait to see how he does over the next few days, if he remains nauseated will contact office on Monday.  Patient scheduled for follow up on 9/2 with Dr. Ardelia Mems.   RN staff please schedule lab appointment for 9//1 for repeat CBC. Will place future order.

## 2014-12-26 NOTE — Telephone Encounter (Signed)
Left message on patient voicemail to call back to schedule a lab appointment for 9/1.

## 2014-12-29 ENCOUNTER — Encounter (HOSPITAL_COMMUNITY): Payer: Self-pay | Admitting: Physician Assistant

## 2014-12-29 ENCOUNTER — Inpatient Hospital Stay (HOSPITAL_COMMUNITY)
Admission: AD | Admit: 2014-12-29 | Discharge: 2015-01-03 | DRG: 474 | Disposition: A | Discharge status: Home Health | Payer: Medicaid Other | Source: Ambulatory Visit | Attending: Family Medicine | Admitting: Family Medicine

## 2014-12-29 ENCOUNTER — Inpatient Hospital Stay (HOSPITAL_COMMUNITY): Payer: Medicaid Other

## 2014-12-29 ENCOUNTER — Other Ambulatory Visit: Payer: Self-pay

## 2014-12-29 DIAGNOSIS — A480 Gas gangrene: Secondary | ICD-10-CM | POA: Diagnosis present

## 2014-12-29 DIAGNOSIS — Z8249 Family history of ischemic heart disease and other diseases of the circulatory system: Secondary | ICD-10-CM | POA: Diagnosis not present

## 2014-12-29 DIAGNOSIS — L97529 Non-pressure chronic ulcer of other part of left foot with unspecified severity: Principal | ICD-10-CM

## 2014-12-29 DIAGNOSIS — E1165 Type 2 diabetes mellitus with hyperglycemia: Secondary | ICD-10-CM | POA: Diagnosis present

## 2014-12-29 DIAGNOSIS — I10 Essential (primary) hypertension: Secondary | ICD-10-CM | POA: Diagnosis present

## 2014-12-29 DIAGNOSIS — K219 Gastro-esophageal reflux disease without esophagitis: Secondary | ICD-10-CM | POA: Diagnosis present

## 2014-12-29 DIAGNOSIS — F4320 Adjustment disorder, unspecified: Secondary | ICD-10-CM | POA: Diagnosis not present

## 2014-12-29 DIAGNOSIS — N179 Acute kidney failure, unspecified: Secondary | ICD-10-CM | POA: Diagnosis not present

## 2014-12-29 DIAGNOSIS — Z833 Family history of diabetes mellitus: Secondary | ICD-10-CM

## 2014-12-29 DIAGNOSIS — D573 Sickle-cell trait: Secondary | ICD-10-CM | POA: Diagnosis present

## 2014-12-29 DIAGNOSIS — E11621 Type 2 diabetes mellitus with foot ulcer: Secondary | ICD-10-CM

## 2014-12-29 DIAGNOSIS — K5900 Constipation, unspecified: Secondary | ICD-10-CM | POA: Diagnosis present

## 2014-12-29 DIAGNOSIS — L02419 Cutaneous abscess of limb, unspecified: Secondary | ICD-10-CM

## 2014-12-29 DIAGNOSIS — E871 Hypo-osmolality and hyponatremia: Secondary | ICD-10-CM | POA: Diagnosis not present

## 2014-12-29 DIAGNOSIS — E1142 Type 2 diabetes mellitus with diabetic polyneuropathy: Secondary | ICD-10-CM | POA: Diagnosis present

## 2014-12-29 DIAGNOSIS — D62 Acute posthemorrhagic anemia: Secondary | ICD-10-CM | POA: Diagnosis not present

## 2014-12-29 DIAGNOSIS — E1152 Type 2 diabetes mellitus with diabetic peripheral angiopathy with gangrene: Secondary | ICD-10-CM | POA: Diagnosis present

## 2014-12-29 DIAGNOSIS — R0989 Other specified symptoms and signs involving the circulatory and respiratory systems: Secondary | ICD-10-CM | POA: Diagnosis present

## 2014-12-29 DIAGNOSIS — L03116 Cellulitis of left lower limb: Secondary | ICD-10-CM | POA: Diagnosis present

## 2014-12-29 DIAGNOSIS — E11628 Type 2 diabetes mellitus with other skin complications: Secondary | ICD-10-CM | POA: Diagnosis present

## 2014-12-29 DIAGNOSIS — T847XXS Infection and inflammatory reaction due to other internal orthopedic prosthetic devices, implants and grafts, sequela: Secondary | ICD-10-CM

## 2014-12-29 DIAGNOSIS — N400 Enlarged prostate without lower urinary tract symptoms: Secondary | ICD-10-CM | POA: Diagnosis present

## 2014-12-29 DIAGNOSIS — R5082 Postprocedural fever: Secondary | ICD-10-CM | POA: Diagnosis not present

## 2014-12-29 DIAGNOSIS — F17210 Nicotine dependence, cigarettes, uncomplicated: Secondary | ICD-10-CM | POA: Diagnosis present

## 2014-12-29 DIAGNOSIS — D473 Essential (hemorrhagic) thrombocythemia: Secondary | ICD-10-CM | POA: Diagnosis present

## 2014-12-29 DIAGNOSIS — E1140 Type 2 diabetes mellitus with diabetic neuropathy, unspecified: Secondary | ICD-10-CM | POA: Diagnosis present

## 2014-12-29 DIAGNOSIS — T847XXA Infection and inflammatory reaction due to other internal orthopedic prosthetic devices, implants and grafts, initial encounter: Secondary | ICD-10-CM | POA: Diagnosis present

## 2014-12-29 DIAGNOSIS — I96 Gangrene, not elsewhere classified: Secondary | ICD-10-CM

## 2014-12-29 DIAGNOSIS — L0390 Cellulitis, unspecified: Secondary | ICD-10-CM | POA: Diagnosis present

## 2014-12-29 DIAGNOSIS — I4892 Unspecified atrial flutter: Secondary | ICD-10-CM | POA: Diagnosis present

## 2014-12-29 DIAGNOSIS — M869 Osteomyelitis, unspecified: Secondary | ICD-10-CM | POA: Diagnosis present

## 2014-12-29 DIAGNOSIS — L03119 Cellulitis of unspecified part of limb: Secondary | ICD-10-CM

## 2014-12-29 HISTORY — DX: Osteomyelitis, unspecified: M86.9

## 2014-12-29 LAB — CBC WITH DIFFERENTIAL/PLATELET
BASOS PCT: 0 % (ref 0–1)
BASOS PCT: 0 % (ref 0–1)
Basophils Absolute: 0 10*3/uL (ref 0.0–0.1)
Basophils Absolute: 0 10*3/uL (ref 0.0–0.1)
EOS ABS: 0.1 10*3/uL (ref 0.0–0.7)
EOS PCT: 1 % (ref 0–5)
EOS PCT: 1 % (ref 0–5)
Eosinophils Absolute: 0.1 10*3/uL (ref 0.0–0.7)
HEMATOCRIT: 25.7 % — AB (ref 39.0–52.0)
HEMATOCRIT: 26 % — AB (ref 39.0–52.0)
HEMOGLOBIN: 8.7 g/dL — AB (ref 13.0–17.0)
Hemoglobin: 8.9 g/dL — ABNORMAL LOW (ref 13.0–17.0)
LYMPHS ABS: 3.8 10*3/uL (ref 0.7–4.0)
Lymphocytes Relative: 23 % (ref 12–46)
Lymphocytes Relative: 33 % (ref 12–46)
Lymphs Abs: 2.6 10*3/uL (ref 0.7–4.0)
MCH: 27.3 pg (ref 26.0–34.0)
MCH: 26.8 pg (ref 26.0–34.0)
MCHC: 34.6 g/dL (ref 30.0–36.0)
MCHC: 33.5 g/dL (ref 30.0–36.0)
MCV: 78.8 fL (ref 78.0–100.0)
MCV: 80 fL (ref 78.0–100.0)
MONO ABS: 0.9 10*3/uL (ref 0.1–1.0)
MONO ABS: 0.8 10*3/uL (ref 0.1–1.0)
MPV: 7.9 fL — AB (ref 8.6–12.4)
Monocytes Relative: 8 % (ref 3–12)
Monocytes Relative: 7 % (ref 3–12)
Neutro Abs: 7.8 10*3/uL — ABNORMAL HIGH (ref 1.7–7.7)
Neutro Abs: 6.9 10*3/uL (ref 1.7–7.7)
Neutrophils Relative %: 68 % (ref 43–77)
Neutrophils Relative %: 59 % (ref 43–77)
PLATELETS: 694 10*3/uL — AB (ref 150–400)
Platelets: 625 10*3/uL — ABNORMAL HIGH (ref 150–400)
RBC: 3.26 MIL/uL — AB (ref 4.22–5.81)
RBC: 3.25 MIL/uL — ABNORMAL LOW (ref 4.22–5.81)
RDW: 15.9 % — AB (ref 11.5–15.5)
RDW: 15.5 % (ref 11.5–15.5)
WBC: 11.4 10*3/uL — AB (ref 4.0–10.5)
WBC: 11.6 10*3/uL — ABNORMAL HIGH (ref 4.0–10.5)

## 2014-12-29 LAB — COMPREHENSIVE METABOLIC PANEL
ALBUMIN: 2.9 g/dL — AB (ref 3.5–5.0)
ALT: 17 U/L (ref 17–63)
AST: 29 U/L (ref 15–41)
Alkaline Phosphatase: 83 U/L (ref 38–126)
Anion gap: 9 (ref 5–15)
CHLORIDE: 105 mmol/L (ref 101–111)
CO2: 23 mmol/L (ref 22–32)
CREATININE: 0.76 mg/dL (ref 0.61–1.24)
Calcium: 9 mg/dL (ref 8.9–10.3)
GFR calc Af Amer: >60 mL/min (ref >60)
GFR calc non Af Amer: >60 mL/min (ref >60)
GLUCOSE: 55 mg/dL — AB (ref 65–99)
Potassium: 4.3 mmol/L (ref 3.5–5.1)
SODIUM: 137 mmol/L (ref 135–145)
Total Bilirubin: 0.6 mg/dL (ref 0.3–1.2)
Total Protein: 7.5 g/dL (ref 6.5–8.1)

## 2014-12-29 LAB — GLUCOSE, CAPILLARY
GLUCOSE-CAPILLARY: 92 mg/dL (ref 65–99)
GLUCOSE-CAPILLARY: 138 mg/dL — AB (ref 65–99)
Glucose-Capillary: 59 mg/dL — ABNORMAL LOW (ref 65–99)

## 2014-12-29 LAB — MRSA PCR SCREENING: MRSA by PCR: NEGATIVE

## 2014-12-29 MED ORDER — GABAPENTIN 100 MG PO CAPS
100.0000 mg | ORAL_CAPSULE | Freq: Three times a day (TID) | ORAL | Status: DC
  Administered 2014-12-29 – 2015-01-03 (×14): 100 mg via ORAL
  Filled 2014-12-29 (×14): qty 1

## 2014-12-29 MED ORDER — PIPERACILLIN-TAZOBACTAM 3.375 G IVPB
3.3750 g | Freq: Three times a day (TID) | INTRAVENOUS | Status: DC
  Administered 2014-12-29 – 2015-01-01 (×8): 3.375 g via INTRAVENOUS
  Filled 2014-12-29 (×10): qty 50

## 2014-12-29 MED ORDER — LISINOPRIL 20 MG PO TABS
20.0000 mg | ORAL_TABLET | Freq: Every day | ORAL | Status: DC
  Administered 2014-12-30: 20 mg via ORAL
  Filled 2014-12-29: qty 1

## 2014-12-29 MED ORDER — SODIUM CHLORIDE 0.9 % IV SOLN
2000.0000 mg | Freq: Once | INTRAVENOUS | Status: AC
  Administered 2014-12-29: 2000 mg via INTRAVENOUS
  Filled 2014-12-29: qty 2000

## 2014-12-29 MED ORDER — MORPHINE SULFATE (PF) 2 MG/ML IV SOLN
1.0000 mg | INTRAVENOUS | Status: DC | PRN
Start: 2014-12-29 — End: 2015-01-02
  Administered 2014-12-29 – 2015-01-01 (×3): 1 mg via INTRAVENOUS
  Filled 2014-12-29 (×3): qty 1

## 2014-12-29 MED ORDER — DEXTROSE 50 % IV SOLN
25.0000 mL | Freq: Once | INTRAVENOUS | Status: AC
  Administered 2014-12-29: 25 mL via INTRAVENOUS

## 2014-12-29 MED ORDER — ATORVASTATIN CALCIUM 40 MG PO TABS
40.0000 mg | ORAL_TABLET | Freq: Every day | ORAL | Status: DC
  Administered 2014-12-29 – 2015-01-02 (×5): 40 mg via ORAL
  Filled 2014-12-29 (×4): qty 1
  Filled 2014-12-29: qty 2

## 2014-12-29 MED ORDER — DEXTROSE 50 % IV SOLN
INTRAVENOUS | Status: AC
  Filled 2014-12-29: qty 50

## 2014-12-29 MED ORDER — MORPHINE SULFATE (PF) 2 MG/ML IV SOLN
2.0000 mg | INTRAVENOUS | Status: DC | PRN
  Administered 2014-12-29 – 2014-12-31 (×7): 2 mg via INTRAVENOUS
  Filled 2014-12-29 (×7): qty 1

## 2014-12-29 MED ORDER — INSULIN ASPART 100 UNIT/ML ~~LOC~~ SOLN
0.0000 [IU] | Freq: Every day | SUBCUTANEOUS | Status: DC
  Administered 2014-12-30: 3 [IU] via SUBCUTANEOUS
  Administered 2015-01-01: 4 [IU] via SUBCUTANEOUS
  Administered 2015-01-02: 2 [IU] via SUBCUTANEOUS

## 2014-12-29 MED ORDER — SODIUM CHLORIDE 0.9 % IV SOLN
1500.0000 mg | Freq: Two times a day (BID) | INTRAVENOUS | Status: DC
  Administered 2014-12-30 – 2015-01-01 (×4): 1500 mg via INTRAVENOUS
  Filled 2014-12-29 (×7): qty 1500

## 2014-12-29 MED ORDER — INSULIN ASPART 100 UNIT/ML ~~LOC~~ SOLN
0.0000 [IU] | Freq: Three times a day (TID) | SUBCUTANEOUS | Status: DC
  Administered 2014-12-30: 2 [IU] via SUBCUTANEOUS
  Administered 2014-12-31 (×2): 3 [IU] via SUBCUTANEOUS
  Administered 2015-01-01 (×2): 5 [IU] via SUBCUTANEOUS
  Administered 2015-01-01 – 2015-01-02 (×2): 3 [IU] via SUBCUTANEOUS
  Administered 2015-01-02 – 2015-01-03 (×3): 5 [IU] via SUBCUTANEOUS
  Administered 2015-01-03: 3 [IU] via SUBCUTANEOUS

## 2014-12-29 MED ORDER — PIPERACILLIN-TAZOBACTAM 3.375 G IVPB 30 MIN
3.3750 g | Freq: Once | INTRAVENOUS | Status: AC
  Administered 2014-12-29: 3.375 g via INTRAVENOUS
  Filled 2014-12-29: qty 50

## 2014-12-29 MED ORDER — GADOBENATE DIMEGLUMINE 529 MG/ML IV SOLN
20.0000 mL | Freq: Once | INTRAVENOUS | Status: AC | PRN
  Administered 2014-12-29: 20 mL via INTRAVENOUS

## 2014-12-29 MED ORDER — SODIUM CHLORIDE 0.9 % IV SOLN
INTRAVENOUS | Status: DC
  Administered 2014-12-29: 15:00:00 via INTRAVENOUS

## 2014-12-29 MED ORDER — ENSURE ENLIVE PO LIQD
237.0000 mL | Freq: Two times a day (BID) | ORAL | Status: DC
  Administered 2014-12-29 – 2015-01-01 (×5): 237 mL via ORAL

## 2014-12-29 MED ORDER — PANTOPRAZOLE SODIUM 40 MG PO TBEC
40.0000 mg | DELAYED_RELEASE_TABLET | Freq: Every day | ORAL | Status: DC
  Administered 2014-12-30 – 2015-01-03 (×6): 40 mg via ORAL
  Filled 2014-12-29 (×8): qty 1

## 2014-12-29 NOTE — Progress Notes (Signed)
CRITICAL VALUE ALERT  Critical value received:  24  Date of notification:  12/29/2014  Time of notification: 1610  Critical value read back:yes  Nurse who received alert:  Deklen Popelka  MD notified (1st page): walter  Time of first page:  57  MD notified (2nd page):  Time of second page:  Responding MD: walter  Time MD responded:  1630

## 2014-12-29 NOTE — Progress Notes (Signed)
Went to evaluate patient, admitted by cross cover for concerns of wet gangrene.  Pain well controlled. Vascular is evaluating patient currently.   1+DP pulse on right, unable to palpate pulses on the L, but noted DP pulse on doppler.       Vascular following, appreciate recommendations  MRI and ABIs ordered Continue pain management per H&P No evidence of systemic illness, blood cultures pending and patient started on Vanc/Zosyn.  Archie Patten, MD [facility] Family Medicine Resident  12/29/2014, 1:53 PM

## 2014-12-29 NOTE — Progress Notes (Signed)
Labs done today Ray Hall 
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
## 2014-12-29 NOTE — Progress Notes (Signed)
ANTIBIOTIC CONSULT NOTE - INITIAL  Pharmacy Consult for vancomycin and zosyn Indication: wet gangrene  Allergies  Allergen Reactions  . Lactose Intolerance (Gi) Other (See Comments)    Gas & heartburn    Patient Measurements:    Weight: 143.3 kg  Vital Signs:   Intake/Output from previous day:   Intake/Output from this shift:    Labs: No results for input(s): WBC, HGB, PLT, LABCREA, CREATININE in the last 72 hours. Estimated Creatinine Clearance: 166.9 mL/min (by C-G formula based on Cr of 0.79). No results for input(s): VANCOTROUGH, VANCOPEAK, VANCORANDOM, GENTTROUGH, GENTPEAK, GENTRANDOM, TOBRATROUGH, TOBRAPEAK, TOBRARND, AMIKACINPEAK, AMIKACINTROU, AMIKACIN in the last 72 hours.   Microbiology: Recent Results (from the past 720 hour(s))  Blood culture (routine x 2)     Status: None   Collection Time: 12/16/14  4:20 PM  Result Value Ref Range Status   Specimen Description BLOOD RIGHT FOREARM  Final   Special Requests BOTTLES DRAWN AEROBIC AND ANAEROBIC 5CC  Final   Culture NO GROWTH 5 DAYS  Final   Report Status 12/21/2014 FINAL  Final  Blood culture (routine x 2)     Status: None   Collection Time: 12/16/14  4:25 PM  Result Value Ref Range Status   Specimen Description BLOOD LEFT ARM  Final   Special Requests BOTTLES DRAWN AEROBIC AND ANAEROBIC 5CC  Final   Culture NO GROWTH 5 DAYS  Final   Report Status 12/21/2014 FINAL  Final  Urine culture     Status: None   Collection Time: 12/16/14 10:29 PM  Result Value Ref Range Status   Specimen Description URINE, CLEAN CATCH  Final   Special Requests NONE  Final   Culture >=100,000 COLONIES/mL YEAST  Final   Report Status 12/18/2014 FINAL  Final  Culture, blood (routine x 2)     Status: None   Collection Time: 12/18/14 11:00 PM  Result Value Ref Range Status   Specimen Description BLOOD LEFT ANTECUBITAL  Final   Special Requests BOTTLES DRAWN AEROBIC AND ANAEROBIC 10CC  Final   Culture NO GROWTH 5 DAYS  Final   Report Status 12/23/2014 FINAL  Final  Culture, blood (routine x 2)     Status: None   Collection Time: 12/18/14 11:05 PM  Result Value Ref Range Status   Specimen Description BLOOD RIGHT HAND  Final   Special Requests BOTTLES DRAWN AEROBIC AND ANAEROBIC 10CC  Final   Culture NO GROWTH 5 DAYS  Final   Report Status 12/23/2014 FINAL  Final    Medical History: Past Medical History  Diagnosis Date  . Sickle cell trait   . Normal nuclear stress test 07/2010    low prob of ischemia, EF 42%  . Echocardiogram abnormal     moderate LVH, mild LV hypokenesis, EF 42%  . History of Doppler ultrasound 07/2010    negative for DVT  . Abnormal CT of the abdomen     mild L hydronephrosis and hydroureter  . Status post radiofrequency ablation for arrhythmia 10/16/10    [facility] MD)  . Diabetes mellitus   . Hypertension     Assessment: 54 yo M to be admitted for worsening foot infection/wet gangrene.  PMH: DM2, HTN, BPH, diabetic foot ulcer, A flutter.  To get MRI of foot. Wt 143.3 kg, creat 0.79 on 12/23/14.  WBC 17.1 on 8/26.  Admission labs not yet drawn.   vanc 9/1>> Zosyn 9/1>>  9/1 BCx2>>   Goal of Therapy:  Vancomycin trough 10-15 mcg/ml  Plan:  -zosyn 3.375 gm IV x 1 dose over 30 minutes then zosyn 3.375 gm IV q8h EI -vancomycin 2 gm loading dose, then vancomycin 1500 mg IV q12h per obesity dosing nomogram -f/u renal fxn, wbc, temp, culture data, clinical progress -vanc levels as needed  Eudelia Bunch, Pharm.D. [phone] 12/29/2014 12:51 PM

## 2014-12-29 NOTE — Consult Note (Signed)
WOC consulted, however after review of the chart it appears [facility] has ordered ABI/MRI. Will await results of these but based on images reviewed in the chart from the family medicine resident today I have ordered non adherent dressing with antibacterial properties for now.  Will await completion of the vascular evaluation to determine other wound care needs.  Orders written and I have communicated with the bedside nurse.  WOC will follow along with you Para March RN,CWOCN [phone]

## 2014-12-29 NOTE — H&P (Signed)
[facility] Admission History and Physical Service Pager: [phone]  Patient name: Ray Hall Medical record number: [id number] Date of birth: Sep 11, 1960 Age: 54 y.o. Gender: male  Primary Care Provider: Tawanna Sat, MD Consultants: Vascular surgery  Code Status: Full  Chief Complaint: Worsening foot infection on left  Assessment and Plan: Ray Hall is a 54 y.o. male presenting with possible wet gangrene. PMH is significant for type 2 DM, HTN, BPH, diabetic foot ulcer, GERD, and atrial flutter.   Wet gangrene in patient with diabetic foot ulceration: No signs of SIRS on exam today. Patient afebrile and states he feels improved from hospitalization.  -Admit to [facility] under Dr. Gwendlyn Deutscher -obtain labs: CBC, CMP -blood cultures x2 -will start Vanc/Zoysn -ABIs of bilateral extremities -STAT MRI of left foot -consult to vascular surgery -wound care consult placed  HTN: Blood pressure slighlty elevated in clinic. - continue lisinopril  - continue to monitor  DM2: Last A1c 7.4 8/19, on glipizide, metformin at home - SSI - CBG ACHS  History of atrial flutter 2012 s/p ablation in 2012 at [facility], currently in NSR, no symptoms of palpitations - monitor closely -no need for telemetry at this time  GERD:  -PPI started  FEN/GI: NPO pending Vascular recs/ NS 125cc/hr Prophylaxis: None pending possible immanent surgery; SCDs  Disposition: Direct admit to hospital from clinic  History of Present Illness:  Ray Hall is a 54 y.o. male presenting with worsening of his left foot diabetic ulceration. Patient was recently hospitalized on 12/16/14 for concern of osteomyelitis. Osteomyelitis was ruled out at that time and patient was diagnosed with a cellulitis. He was given a course of doxycycline. Patient completed course of doxycyline on Tuesday. He was given wound care instructions and him and his wife performed wound dressing changes daily.    Patient was seen for hospital follow-up on 12/23/2014.  At that appointment patient's ulceration appeared clean and intact.   Patient returned to clinic today for blood recheck as he had an elevated white count at last office visit but was afebrile. When he presented to clinic today patient stated that his foot had worsened. He states that now his skin is sloughing off and there is drainage. Infection has spread over the whole plantar aspect of top foot and in between all digits. He states he thought the skin sloughing was due to the old skin falling off and new skin about to grow. Denies any fevers and chills.    Review Of Systems: Per HPI.   Patient Active Problem List   Diagnosis Date Noted  . Gangrene associated with diabetes mellitus 12/29/2014  . Acid reflux 12/23/2014  . AKI (acute kidney injury) 12/23/2014  . Anemia 12/23/2014  . Headache 12/23/2014  . Chest pain 12/17/2014  . History of atrial fibrillation 12/17/2014  . Elevated troponin   . Diabetic foot ulcer 12/16/2014  . Sepsis 12/16/2014  . SIRS (systemic inflammatory response syndrome) 12/16/2014  . Atrial flutter 12/16/2014  . Pain of left upper extremity 02/08/2014  . Erectile dysfunction 11/06/2012  . BENIGN PROSTATIC HYPERTROPHY, WITH OBSTRUCTION 05/25/2010  . LOW BACK PAIN SYNDROME, SEVERE 04/16/2010  . History of recurrent UTIs 12/09/2008  . Diabetes mellitus, type II 06/26/2008  . Essential hypertension 06/24/2008   Past Medical History: Past Medical History  Diagnosis Date  . Sickle cell trait   . Normal nuclear stress test 07/2010    low prob of ischemia, EF 42%  . Echocardiogram abnormal  moderate LVH, mild LV hypokenesis, EF 42%  . History of Doppler ultrasound 07/2010    negative for DVT  . Abnormal CT of the abdomen     mild L hydronephrosis and hydroureter  . Status post radiofrequency ablation for arrhythmia 10/16/10    [facility] MD)  . Diabetes mellitus   . Hypertension    Past  Surgical History: Past Surgical History  Procedure Laterality Date  . Radiofrequency ablation  10/16/10    Cardiac for atrial arrythmia, [facility]  . Dental extractions  10/15/10    prior to ablation  . Patella fracture surgery      metal rod left leg   Social History: Social History  Substance Use Topics  . Smoking status: Current Every Day Smoker -- 1.50 packs/day for 17 years    Types: Cigars  . Smokeless tobacco: Never Used     Comment: 2 cigars  . Alcohol Use: No   Additional social history: lives at home with wife. Please also refer to relevant sections of EMR.  Family History: Family History  Problem Relation Age of Onset  . Sickle cell trait    . Heart failure Father   . Diabetes Father   . Diabetes Mother   . Hypertension Mother    Allergies and Medications: Allergies  Allergen Reactions  . Lactose Intolerance (Gi) Other (See Comments)    Gas & heartburn   No current facility-administered medications on file prior to encounter.   Current Outpatient Prescriptions on File Prior to Encounter  Medication Sig Dispense Refill  . Aspirin-Caffeine (BC FAST PAIN RELIEF ARTHRITIS) 1000-65 MG PACK Take 1 Package by mouth daily as needed (for pain).    Marland Kitchen atorvastatin (LIPITOR) 40 MG tablet Take 1 tablet (40 mg total) by mouth daily at 6 PM. 30 tablet 0  . cyclobenzaprine (FLEXERIL) 10 MG tablet TAKE ONE TABLET BY MOUTH THREE TIMES DAILY AS NEEDED FOR MUSCLE SPASM 30 tablet 0  . diphenhydramine-acetaminophen (TYLENOL PM) 25-500 MG TABS Take 2 tablets by mouth at bedtime.    Marland Kitchen doxycycline (VIBRA-TABS) 100 MG tablet Take 1 tablet (100 mg total) by mouth every 12 (twelve) hours. 13 tablet 0  . gabapentin (NEURONTIN) 100 MG capsule Take 1 capsule (100 mg total) by mouth 3 (three) times daily. 270 capsule 2  . glipiZIDE (GLUCOTROL) 10 MG tablet TAKE ONE TABLET BY MOUTH TWICE DAILY BEFORE MEAL(S) 180 tablet 3  . lisinopril (PRINIVIL,ZESTRIL) 20 MG tablet Take 1 tablet (20 mg total) by  mouth daily. 30 tablet 2  . metFORMIN (GLUCOPHAGE) 1000 MG tablet Take 1 tablet (1,000 mg total) by mouth 2 (two) times daily with a meal. 180 tablet 3  . ranitidine (ZANTAC) 150 MG tablet Take 1 tablet (150 mg total) by mouth 2 (two) times daily. 60 tablet 2  . SUMAtriptan (IMITREX) 50 MG tablet Take 1 tablet (50 mg total) by mouth every 2 (two) hours as needed for migraine or headache. May repeat in 2 hours if headache persists or recurs. Do not exceed 3 doses in 24 hours. 9 tablet 0  . traMADol (ULTRAM) 50 MG tablet Take 1 tablet (50 mg total) by mouth every 6 (six) hours as needed. (Patient not taking: Reported on 11/09/2014) 15 tablet 0  . vitamin B-12 1000 MCG tablet Take 1 tablet (1,000 mcg total) by mouth daily. 30 tablet 0    Objective:  Exam: General: obese, alert, NAD, cooperative HEENT: NCAT, no injection and anicteric. EOMI. MMM  Neck: supple, full  ROM. Acanthosis nigricans over back of neck Lungs: CTAB, normal respiratory effort, no crackles, and no wheezes.  Heart: RRR, no M/R/G.  Abdomen: Bowel sounds normal; abdomen soft and nontender.  Pulses: radial pulses intact. Unable to palpate pedal pulses Neurologic: No focal deficits, +5 strength globally, decreased sensation in bilateral feet, A&Ox3. Extremities: Open wound over dorsal and plantar aspect of left foot with swelling, erythema, and pus. No sensation over bilateral feet. 1+ pitting LE edema, serosanguineous drainage present, first three digits of left foot with coolness to palpation and black. No hair growth over bilateral LE. Skin sloughing of left foot. Psych: Mood and affect are normal; no evidence of anxiety or depression.  Labs and Imaging: Pending  Katheren Shams, DO 12/29/2014, 12:32 PM PGY-2, [place] Intern pager: [phone], text pages welcome

## 2014-12-30 ENCOUNTER — Inpatient Hospital Stay (HOSPITAL_COMMUNITY): Payer: Medicaid Other

## 2014-12-30 ENCOUNTER — Ambulatory Visit: Payer: Self-pay | Admitting: Family Medicine

## 2014-12-30 DIAGNOSIS — R0989 Other specified symptoms and signs involving the circulatory and respiratory systems: Secondary | ICD-10-CM

## 2014-12-30 DIAGNOSIS — T847XXS Infection and inflammatory reaction due to other internal orthopedic prosthetic devices, implants and grafts, sequela: Secondary | ICD-10-CM

## 2014-12-30 DIAGNOSIS — T847XXA Infection and inflammatory reaction due to other internal orthopedic prosthetic devices, implants and grafts, initial encounter: Secondary | ICD-10-CM | POA: Diagnosis present

## 2014-12-30 LAB — CBC
HCT: 26.8 % — ABNORMAL LOW (ref 39.0–52.0)
Hemoglobin: 8.8 g/dL — ABNORMAL LOW (ref 13.0–17.0)
MCH: 26.1 pg (ref 26.0–34.0)
MCHC: 32.8 g/dL (ref 30.0–36.0)
MCV: 79.5 fL (ref 78.0–100.0)
PLATELETS: UNDETERMINED 10*3/uL (ref 150–400)
RBC: 3.37 MIL/uL — ABNORMAL LOW (ref 4.22–5.81)
RDW: 15.7 % — AB (ref 11.5–15.5)
WBC: 11.9 10*3/uL — AB (ref 4.0–10.5)

## 2014-12-30 LAB — GLUCOSE, CAPILLARY
GLUCOSE-CAPILLARY: 125 mg/dL — AB (ref 65–99)
GLUCOSE-CAPILLARY: 109 mg/dL — AB (ref 65–99)
Glucose-Capillary: 116 mg/dL — ABNORMAL HIGH (ref 65–99)

## 2014-12-30 NOTE — Consult Note (Signed)
Reason for Consult: Gangrene left foot Referring Physician: Dr. Lennette Bihari supple  Ray Hall is an 54 y.o. male.  HPI: Patient is a 54 year old gentleman with diabetic insensate neuropathy patient is status post a reported intramedullary nailing of the left tibia. He states he's been followed by family practice with his left foot since July. Patient presents at this time with gangrene including the forefoot and midfoot with an MRI scan showing osteomyelitis of both the forefoot and midfoot.  Past Medical History  Diagnosis Date  . Sickle cell trait   . Normal nuclear stress test 07/2010    low prob of ischemia, EF 42%  . Echocardiogram abnormal     moderate LVH, mild LV hypokenesis, EF 42%  . History of Doppler ultrasound 07/2010    negative for DVT  . Abnormal CT of the abdomen     mild L hydronephrosis and hydroureter  . Status post radiofrequency ablation for arrhythmia 10/16/10    [facility] R[facility] MD)  . Diabetes mellitus   . Hypertension   . Osteomyelitis 12/2014    LEFT FOOT    Past Surgical History  Procedure Laterality Date  . Radiofrequency ablation  10/16/10    Cardiac for atrial arrythmia, [facility]  . Dental extractions  10/15/10    prior to ablation  . Patella fracture surgery      metal rod left leg  . Tonsillectomy    . Rotator cuff repair Left 10/2014    Family History  Problem Relation Age of Onset  . Sickle cell trait    . Heart failure Father   . Diabetes Father   . Diabetes Mother   . Hypertension Mother     Social History:  reports that he quit smoking about 2 weeks ago. His smoking use included Cigars. He has never used smokeless tobacco. He reports that he does not drink alcohol or use illicit drugs.  Allergies:  Allergies  Allergen Reactions  . Lactose Intolerance (Gi) Other (See Comments)    Gas & heartburn    Medications: I have reviewed the patient's current medications.  Results for orders placed or performed during the hospital encounter of  12/29/14 (from the past 48 hour(s))  MRSA PCR Screening     Status: None   Collection Time: 12/29/14 12:31 PM  Result Value Ref Range   MRSA by PCR NEGATIVE NEGATIVE    Comment:        The GeneXpert MRSA Assay (FDA approved for NASAL specimens only), is one component of a comprehensive MRSA colonization surveillance program. It is not intended to diagnose MRSA infection nor to guide or monitor treatment for MRSA infections.   Comprehensive metabolic panel     Status: Abnormal   Collection Time: 12/29/14  2:30 PM  Result Value Ref Range   Sodium 137 135 - 145 mmol/L   Potassium 4.3 3.5 - 5.1 mmol/L   Chloride 105 101 - 111 mmol/L   CO2 23 22 - 32 mmol/L   Glucose, Bld 55 (L) 65 - 99 mg/dL   BUN <5 (L) 6 - 20 mg/dL   Creatinine, Ser 0.76 0.61 - 1.24 mg/dL   Calcium 9.0 8.9 - 10.3 mg/dL   Total Protein 7.5 6.5 - 8.1 g/dL   Albumin 2.9 (L) 3.5 - 5.0 g/dL   AST 29 15 - 41 U/L   ALT 17 17 - 63 U/L   Alkaline Phosphatase 83 38 - 126 U/L   Total Bilirubin 0.6 0.3 - 1.2 mg/dL  GFR calc non Af Amer >60 >60 mL/min   GFR calc Af Amer >60 >60 mL/min    Comment: (NOTE) The eGFR has been calculated using the CKD EPI equation. This calculation has not been validated in all clinical situations. eGFR's persistently <60 mL/min signify possible Chronic Kidney Disease.    Anion gap 9 5 - 15  CBC WITH DIFFERENTIAL     Status: Abnormal   Collection Time: 12/29/14  2:30 PM  Result Value Ref Range   WBC 11.6 (H) 4.0 - 10.5 K/uL   RBC 3.25 (L) 4.22 - 5.81 MIL/uL   Hemoglobin 8.7 (L) 13.0 - 17.0 g/dL   HCT 26.0 (L) 39.0 - 52.0 %   MCV 80.0 78.0 - 100.0 fL   MCH 26.8 26.0 - 34.0 pg   MCHC 33.5 30.0 - 36.0 g/dL   RDW 15.5 11.5 - 15.5 %   Platelets 625 (H) 150 - 400 K/uL   Neutrophils Relative % 59 43 - 77 %   Lymphocytes Relative 33 12 - 46 %   Monocytes Relative 7 3 - 12 %   Eosinophils Relative 1 0 - 5 %   Basophils Relative 0 0 - 1 %   Neutro Abs 6.9 1.7 - 7.7 K/uL   Lymphs Abs 3.8  0.7 - 4.0 K/uL   Monocytes Absolute 0.8 0.1 - 1.0 K/uL   Eosinophils Absolute 0.1 0.0 - 0.7 K/uL   Basophils Absolute 0.0 0.0 - 0.1 K/uL   RBC Morphology POLYCHROMASIA PRESENT    WBC Morphology ATYPICAL LYMPHOCYTES   Glucose, capillary     Status: Abnormal   Collection Time: 12/29/14  4:05 PM  Result Value Ref Range   Glucose-Capillary 59 (L) 65 - 99 mg/dL  Glucose, capillary     Status: None   Collection Time: 12/29/14  4:29 PM  Result Value Ref Range   Glucose-Capillary 92 65 - 99 mg/dL  Glucose, capillary     Status: Abnormal   Collection Time: 12/29/14 10:16 PM  Result Value Ref Range   Glucose-Capillary 138 (H) 65 - 99 mg/dL  Glucose, capillary     Status: Abnormal   Collection Time: 12/30/14  8:11 AM  Result Value Ref Range   Glucose-Capillary 125 (H) 65 - 99 mg/dL  CBC     Status: Abnormal   Collection Time: 12/30/14 11:04 AM  Result Value Ref Range   WBC 11.9 (H) 4.0 - 10.5 K/uL    Comment: WHITE COUNT CONFIRMED ON SMEAR   RBC 3.37 (L) 4.22 - 5.81 MIL/uL   Hemoglobin 8.8 (L) 13.0 - 17.0 g/dL   HCT 26.8 (L) 39.0 - 52.0 %   MCV 79.5 78.0 - 100.0 fL   MCH 26.1 26.0 - 34.0 pg   MCHC 32.8 30.0 - 36.0 g/dL   RDW 15.7 (H) 11.5 - 15.5 %   Platelets PLATELET CLUMPS NOTED ON SMEAR, UNABLE TO ESTIMATE 150 - 400 K/uL  Glucose, capillary     Status: Abnormal   Collection Time: 12/30/14 11:59 AM  Result Value Ref Range   Glucose-Capillary 116 (H) 65 - 99 mg/dL  Glucose, capillary     Status: Abnormal   Collection Time: 12/30/14  4:38 PM  Result Value Ref Range   Glucose-Capillary 109 (H) 65 - 99 mg/dL    Mr Foot Left W Wo Contrast  12/29/2014   CLINICAL DATA:  Worsening diabetic foot ulceration of the patient's first through third digits appearing gangrenous with purulent discharge between the first and second toes.  Plantar skin ulceration.  EXAM: MRI OF THE LEFT FOREFOOT WITHOUT AND WITH CONTRAST  TECHNIQUE: Multiplanar, multisequence MR imaging was performed both before and  after administration of intravenous contrast.  CONTRAST:  20 mL MULTIHANCE GADOBENATE DIMEGLUMINE 529 MG/ML IV SOLN  COMPARISON:  MRI of the left foot 12/16/2014.  FINDINGS: Skin ulceration is seen on the plantar surface left foot along the medial margin of the first metatarsal head. There is intense subcutaneous edema enhancement about the foot consistent with cellulitis. Extensive soft tissue gas is seen on the plantar surface of the foot centered at the level of the second, third and fourth MTP joints. No rim enhancing fluid collection is identified.  There is new marrow edema and enhancement in the heads of the second, third and fourth metatarsals and bases of the proximal phalanges of the second, third and fourth toes. Signal change and enhancement appear worst about the second and third MTP joints. Also seen is a new small focus of marrow edema and enhancement in the plantar surface of the first metatarsal head between the sesamoid bones. The great and little toes are spared.  IMPRESSION: Marked worsening in the appearance of the foot with new soft tissue gas in the plantar soft tissues at the level of the second, third and fourth MTP joints. No abscess is identified.  New marrow edema about the second, third and fourth MTP joints appears worst at the second and third and is consistent with osteomyelitis. There is likely a small focus of osteomyelitis in the plantar surface of the head of the first metatarsal.  Intense edema and enhancement over the dorsum of the foot consistent with cellulitis.   Electronically Signed   By: Inge Rise M.D.   On: 12/29/2014 19:40    Review of Systems  All other systems reviewed and are negative.  Blood pressure 141/78, pulse 74, temperature 98.4 F (36.9 C), temperature source Oral, resp. rate 16, height 6' 3"  (1.905 m), SpO2 100 %. Physical Exam On examination patient has dry gangrenous changes with purulent drainage from the toes the gangrene extends to the  mid foot. Review of the MRI scan shows osteomyelitis involving the metatarsals. Ankle-brachial indices were also reviewed. Vascular surgery is recommending a transtibial amputation. Radiographs of the tibia are not available. 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  Plan: I will request radiographs of the tibia. Plan for a left transtibial amputation and removal of the intramedullary nail Saturday morning. Risks and benefits were discussed. Patient may be  safe for discharge to home and may require discharge to rehabilitation.  DUDA,MARCUS V 12/30/2014, 4:47 PM

## 2014-12-30 NOTE — Progress Notes (Signed)
Family Medicine Teaching Service Daily Progress Note Intern Pager: [phone]  Patient name: Ray Hall Medical record number: [id number] Date of birth: [birth year] Age: 54 y.o. Gender: male  Primary Care Provider: Tawanna Sat, MD Consultants: Vascular surgery  Code Status: FULL  Pt Overview and Major Events to Date:  - MRI 9/1: worsening appearance with new soft tissue gas at the level of the second, third and fourth MTP joints. New marrow edema about the second, third and fourth MTP joints appears worst at the second and third and is consistent with osteomyelitis. Small focus of osteomyelitis in the plantar surface of the head of the first metatarsal - Vascular surgery following: patient will need to consider L BKA. He has had Orthopedic repair of his tibia previously.The patient thinks he had a rod placed, so vascular would like to defer to Ortho regarding BKA. - started on IV vanc and zosyn on admission   Assessment and Plan: RUHAN BORAK is a 54 y.o. Male with recent hospitalization on 12/16/2014 for concern of osteomyelitis (which was ruled out at that time and was treated for cellulitis with doxycycline) presented to clinic 9/1 with worsening wound with skin sloughing and drainage. CBC with elevated WBC without shift, afebrile.  Admitted for management of wet gangrene from diabetic foot ulcer .   PMH is significant for type 2 DM, HTN, BPH, diabetic foot ulcer, GERD, and atrial flutter.   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 New marrow edema about the second, third and fourth MTP joints appears worst at the second and third and is consistent with osteomyelitis. Small focus of osteomyelitis in the plantar surface of the head of the first metatarsal - followed by vascular surgery: states patient will need to consider L BKA. He has had  Orthopedic repair of his tibia previously.The patient thinks he had a rod placed, so vascular would like to defer to Ortho regarding BKA. - will follow up with patient's orthopedist: [facility]  - Vanc/Zoysn - wound care following  - blood cultures pending  - morphine 2mg  q 3 hrs PRN and morphine 1mg  q 2 hrs PRN for pain    HTN: elevated BP overnight - continue lisinopril  - continue to monitor  DM2: Last A1c 7.4 8/19, on glipizide, metformin at home - SSI - CBG ACHS  History of atrial flutter 2012 s/p ablation in 2012 at [facility], currently in NSR, no symptoms of palpitations - monitor closely - no need for telemetry at this time  GERD:  -PPI started  FEN/GI: NPO pending Vascular recs/ NS 125cc/hr Prophylaxis: None pending possible immanent surgery; SCDs   Disposition:  - pending ortho consult and possible L BKA   Subjective:  Patient was tearful this morning. He states he just found out he needs to get an amputation. We discussed the reasons and importance of this. He does not understand how his wound has worsened as he was recently discharged from the hospital on 8/19 with antibiotics for cellulitis of the same foot. 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 State that he feels depressed about this situation. His pain this morning is 7/10 but states he received pain medications a few minutes ago. Chaplain will visit patient and  his wife this morning.   Objective: Temp:  [97.5 F (36.4 C)-98.4 F (36.9 C)] 98.1 F (36.7 C) (09/02 0612) Pulse Rate:  [81-85] 81 (09/02 0612) Resp:  [18-20] 18 (09/02 0612) BP: (154-166)/(93-97) 154/97 mmHg (09/02 0612) SpO2:  [100 %] 100 % (09/02 0612) Physical Exam: General: NAD, tearful  Cardiovascular: RRR, no murmurs, rubs, or gallops  Respiratory: CTAB, normal respiratory effort, no crackles, and no  wheezes.  Abdomen: Bowel sounds normal; abdomen soft and nontender Extremities: Left foot wound dressed in gauze with some yellow discharge noted on gauze. No sensation over left foot. No edema noted today. No hair noted on LE bilaterally. Unable to palpate DP pulses bilaterally.  Psyc: tearful and sad this morning due to news of L BKA.   Laboratory:  Recent Labs Lab 12/23/14 1503 12/29/14 1105 12/29/14 1430  WBC 17.7* 11.4* 11.6*  HGB 9.0* 8.9* 8.7*  HCT 26.4* 25.7* 26.0*  PLT 625* 694* 625*    Recent Labs Lab 12/23/14 1503 12/29/14 1430  NA 138 137  K 3.7 4.3  CL 99 105  CO2 22 23  BUN 10 <5*  CREATININE 0.79 0.76  CALCIUM 9.0 9.0  PROT  --  7.5  BILITOT  --  0.6  ALKPHOS  --  83  ALT  --  17  AST  --  29  GLUCOSE 100* 55*     Imaging/Diagnostic Tests:   Smiley Houseman, MD 12/30/2014, 8:49 AM PGY-1, [place] Intern pager: [phone], text pages welcome

## 2014-12-30 NOTE — Progress Notes (Signed)
   12/30/14 1100  Clinical Encounter Type  Visited With Patient  Visit Type Spiritual support  Consult/Referral To Nurse  Stress Factors  Patient Stress Factors Financial concerns;Health changes  Family Stress Factors Financial concerns  Chaplain responded to pager about patient requesting prayer for possible leg amputation. Chaplain prayed for patient and spouse. Chaplain is available if needed further.

## 2014-12-30 NOTE — Progress Notes (Signed)
Chaplain responded to pager about patient requesting prayer for possible leg amputation. Chaplain prayed for patient and spouse. Chaplain is available if needed further.

## 2014-12-30 NOTE — Progress Notes (Signed)
VASCULAR LAB PRELIMINARY  PRELIMINARY  PRELIMINARY  PRELIMINARY.  VASCULAR LAB PRELIMINARY  ARTERIAL  ABI completed: Bilateral ABIs may be falsely elevated due to calcified vessels.    RIGHT    LEFT    PRESSURE WAVEFORM  PRESSURE WAVEFORM  BRACHIAL 141 Triphasic BRACHIAL 149 Triphasic  DP 173 Triphasic DP 105 Monophasic  PT 211 Triphasic PT 125 Monophasic  GREAT TOE 0.60 NA GREAT TOE  NA    RIGHT LEFT  ABI 1.4 0.84     Deryck Hippler D, RVT 12/30/2014, 3:45 PM  .     Alla German, RVT 12/30/2014, 3:45 PM

## 2014-12-30 NOTE — Progress Notes (Signed)
Initial Nutrition Assessment  DOCUMENTATION CODES:   Obesity unspecified  INTERVENTION:   -RD will follow for diet advancement and supplement diet as appropriate  NUTRITION DIAGNOSIS:   Increased nutrient needs related to wound healing as evidenced by estimated needs.  GOAL:   Patient will meet greater than or equal to 90% of their needs  MONITOR:   PO intake, Supplement acceptance, Diet advancement, Labs, Weight trends, Skin, I & O's  REASON FOR ASSESSMENT:   Malnutrition Screening Tool    ASSESSMENT:   Ray Hall is a 54 y.o. male presenting with possible wet gangrene. PMH is significant for type 2 DM, HTN, BPH, diabetic foot ulcer, GERD, and atrial flutter.   Pt admitted with worsening cellulitis/gangrene of lt foot.   Spoke with pt at bedside; he reports he's "depressed" because "they said I might get my foot chopped off".   Pt reports ongoing poor appetite and weight loss over the past year. He reveals UBW of 350# and "just keeps losing". However, this is not consistent with wt hx, which reveals wt stability over the past year.   He expresses frustration regarding NPO status. Pt reports PTA his appetite has been poor. He reveals that he has had difficulty swallowing food over the past month ("I tried to chew but it just wouldn't go down, so I spit it out"). Noted pt was on Glucerna supplements on previous admission. Pt denies using any supplements or vitamin/mineral supplements for wound healing, but just started taking B-12 supplements.   Pr reports suboptimal glycemic control. He reports he usually achieves glucose readings in the 120-130's. Last Hgb A1c was 7.4, taken in 11/2014. He admits that Hgb A1c was been trending up, reporting range is usually below 7. Home regimen is 10 mg glipizide BID.   Nutrition-Focused physical exam completed. Findings are no fat depletion, no muscle depletion, and no edema.   Reviewed COWRN note; pt is awaiting ABI/MRI order from  vascular surgery.   Discussed importace of good PO intake once diet is advanced. RD will add supplements as appropriate.   Labs reviewed.   Diet Order:  Diet NPO time specified Except for: Sips with Meds  Skin:  Wound (see comment) (lt DM foot ulcer)  Last BM:  PTA  Height:   Ht Readings from Last 1 Encounters:  12/30/14 6\' 3"  (1.905 m)    Weight:   Wt Readings from Last 1 Encounters:  12/23/14 316 lb (143.337 kg)    Ideal Body Weight:  89 kg  BMI:  Estimated body mass index is 39.50 kg/(m^2) as calculated from the following:   Height as of this encounter: 6\' 3"  (1.905 m).   Weight as of 12/23/14: 316 lb (143.337 kg).  Estimated Nutritional Needs:   Kcal:  2200-2400  Protein:  135-150 grams  Fluid:  >2.2 L  EDUCATION NEEDS:   Education needs addressed  Jone Panebianco A. Jimmye Norman, RD, LDN, CDE Pager: [phone] After hours Pager: [phone]

## 2014-12-30 NOTE — Progress Notes (Signed)
Chaplain responded to pager about patient requesting prayer for possible leg amputation. Chaplain prayed for patient and spouse. Chaplain is available if needed further. Marlene Bast, [place]

## 2014-12-31 ENCOUNTER — Encounter (HOSPITAL_COMMUNITY): Payer: Self-pay | Admitting: Certified Registered Nurse Anesthetist

## 2014-12-31 ENCOUNTER — Inpatient Hospital Stay (HOSPITAL_COMMUNITY): Payer: Medicaid Other | Admitting: Certified Registered Nurse Anesthetist

## 2014-12-31 ENCOUNTER — Encounter (HOSPITAL_COMMUNITY)
Admission: AD | Disposition: A | Discharge status: Home Health | Payer: Self-pay | Source: Ambulatory Visit | Attending: Family Medicine

## 2014-12-31 DIAGNOSIS — E1152 Type 2 diabetes mellitus with diabetic peripheral angiopathy with gangrene: Secondary | ICD-10-CM | POA: Insufficient documentation

## 2014-12-31 HISTORY — PX: AMPUTATION: SHX166

## 2014-12-31 LAB — CBC
HCT: 25.8 % — ABNORMAL LOW (ref 39.0–52.0)
HEMOGLOBIN: 8.6 g/dL — AB (ref 13.0–17.0)
MCH: 26.8 pg (ref 26.0–34.0)
MCHC: 33.3 g/dL (ref 30.0–36.0)
MCV: 80.4 fL (ref 78.0–100.0)
PLATELETS: 583 10*3/uL — AB (ref 150–400)
RBC: 3.21 MIL/uL — AB (ref 4.22–5.81)
RDW: 15.7 % — ABNORMAL HIGH (ref 11.5–15.5)
WBC: 11.1 10*3/uL — ABNORMAL HIGH (ref 4.0–10.5)

## 2014-12-31 LAB — CBC WITH DIFFERENTIAL/PLATELET
BASOS ABS: 0 10*3/uL (ref 0.0–0.1)
Basophils Relative: 0 % (ref 0–1)
EOS ABS: 0.1 10*3/uL (ref 0.0–0.7)
EOS PCT: 1 % (ref 0–5)
HCT: 27.2 % — ABNORMAL LOW (ref 39.0–52.0)
HEMOGLOBIN: 8.9 g/dL — AB (ref 13.0–17.0)
LYMPHS PCT: 25 % (ref 12–46)
Lymphs Abs: 3.2 10*3/uL (ref 0.7–4.0)
MCH: 26.1 pg (ref 26.0–34.0)
MCHC: 32.7 g/dL (ref 30.0–36.0)
MCV: 79.8 fL (ref 78.0–100.0)
Monocytes Absolute: 0.9 10*3/uL (ref 0.1–1.0)
Monocytes Relative: 7 % (ref 3–12)
NEUTROS PCT: 67 % (ref 43–77)
Neutro Abs: 8.7 10*3/uL — ABNORMAL HIGH (ref 1.7–7.7)
PLATELETS: 654 10*3/uL — AB (ref 150–400)
RBC: 3.41 MIL/uL — AB (ref 4.22–5.81)
RDW: 15.6 % — ABNORMAL HIGH (ref 11.5–15.5)
WBC: 12.9 10*3/uL — AB (ref 4.0–10.5)

## 2014-12-31 LAB — COMPREHENSIVE METABOLIC PANEL
ALK PHOS: 86 U/L (ref 38–126)
ALT: 14 U/L — AB (ref 17–63)
AST: 31 U/L (ref 15–41)
Albumin: 3 g/dL — ABNORMAL LOW (ref 3.5–5.0)
Anion gap: 10 (ref 5–15)
BUN: 8 mg/dL (ref 6–20)
CALCIUM: 9 mg/dL (ref 8.9–10.3)
CHLORIDE: 100 mmol/L — AB (ref 101–111)
CO2: 22 mmol/L (ref 22–32)
CREATININE: 0.95 mg/dL (ref 0.61–1.24)
GFR calc Af Amer: >60 mL/min (ref >60)
GFR calc non Af Amer: >60 mL/min (ref >60)
GLUCOSE: 190 mg/dL — AB (ref 65–99)
Potassium: 4.3 mmol/L (ref 3.5–5.1)
SODIUM: 132 mmol/L — AB (ref 135–145)
Total Bilirubin: 0.6 mg/dL (ref 0.3–1.2)
Total Protein: 8 g/dL (ref 6.5–8.1)

## 2014-12-31 LAB — PROTIME-INR
INR: 1.18 (ref 0.00–1.49)
Prothrombin Time: 15.2 seconds (ref 11.6–15.2)

## 2014-12-31 LAB — TYPE AND SCREEN
ABO/RH(D): AB POS
Antibody Screen: NEGATIVE

## 2014-12-31 LAB — GLUCOSE, CAPILLARY
GLUCOSE-CAPILLARY: 254 mg/dL — AB (ref 65–99)
GLUCOSE-CAPILLARY: 173 mg/dL — AB (ref 65–99)
GLUCOSE-CAPILLARY: 156 mg/dL — AB (ref 65–99)
GLUCOSE-CAPILLARY: 161 mg/dL — AB (ref 65–99)
GLUCOSE-CAPILLARY: 182 mg/dL — AB (ref 65–99)
Glucose-Capillary: 163 mg/dL — ABNORMAL HIGH (ref 65–99)
Glucose-Capillary: 187 mg/dL — ABNORMAL HIGH (ref 65–99)
Glucose-Capillary: 156 mg/dL — ABNORMAL HIGH (ref 65–99)

## 2014-12-31 LAB — SURGICAL PCR SCREEN
MRSA, PCR: NEGATIVE
Staphylococcus aureus: NEGATIVE

## 2014-12-31 LAB — ABO/RH: ABO/RH(D): AB POS

## 2014-12-31 SURGERY — AMPUTATION BELOW KNEE
Anesthesia: General | Laterality: Left

## 2014-12-31 MED ORDER — ACETAMINOPHEN 650 MG RE SUPP: 650.0000 mg | Freq: Four times a day (QID) | RECTAL | Status: DC | PRN

## 2014-12-31 MED ORDER — HYDRALAZINE HCL 20 MG/ML IJ SOLN
10.0000 mg | INTRAMUSCULAR | Status: DC | PRN
  Filled 2014-12-31: qty 1

## 2014-12-31 MED ORDER — HYDROMORPHONE HCL 1 MG/ML IJ SOLN
INTRAMUSCULAR | Status: AC
  Administered 2014-12-31: 0.5 mg via INTRAVENOUS
  Filled 2014-12-31: qty 1

## 2014-12-31 MED ORDER — PROPOFOL 10 MG/ML IV BOLUS
INTRAVENOUS | Status: AC
  Filled 2014-12-31: qty 20

## 2014-12-31 MED ORDER — SCOPOLAMINE 1 MG/3DAYS TD PT72
MEDICATED_PATCH | TRANSDERMAL | Status: DC | PRN
  Administered 2014-12-31: 1 via TRANSDERMAL

## 2014-12-31 MED ORDER — SODIUM CHLORIDE 0.9 % IV SOLN
INTRAVENOUS | Status: DC | PRN
  Administered 2014-12-31: 13:00:00 via INTRAVENOUS

## 2014-12-31 MED ORDER — PROPOFOL 10 MG/ML IV BOLUS
INTRAVENOUS | Status: DC | PRN
  Administered 2014-12-31: 150 mg via INTRAVENOUS
  Administered 2014-12-31: 50 mg via INTRAVENOUS

## 2014-12-31 MED ORDER — SODIUM CHLORIDE 0.9 % IV SOLN: INTRAVENOUS | Status: DC

## 2014-12-31 MED ORDER — ONDANSETRON HCL 4 MG/2ML IJ SOLN
INTRAMUSCULAR | Status: DC | PRN
Start: 2014-12-31 — End: 2014-12-31
  Administered 2014-12-31: 4 mg via INTRAVENOUS

## 2014-12-31 MED ORDER — METOCLOPRAMIDE HCL 5 MG PO TABS
5.0000 mg | ORAL_TABLET | Freq: Three times a day (TID) | ORAL | Status: DC | PRN
Start: 2014-12-31 — End: 2015-01-03

## 2014-12-31 MED ORDER — LIDOCAINE HCL (CARDIAC) 20 MG/ML IV SOLN
INTRAVENOUS | Status: DC | PRN
  Administered 2014-12-31: 50 mg via INTRAVENOUS

## 2014-12-31 MED ORDER — SCOPOLAMINE 1 MG/3DAYS TD PT72
MEDICATED_PATCH | TRANSDERMAL | Status: AC
  Filled 2014-12-31: qty 1

## 2014-12-31 MED ORDER — OXYCODONE HCL 5 MG/5ML PO SOLN: 5.0000 mg | Freq: Once | ORAL | Status: DC | PRN

## 2014-12-31 MED ORDER — OXYCODONE HCL 5 MG PO TABS: 5.0000 mg | ORAL_TABLET | Freq: Once | ORAL | Status: DC | PRN

## 2014-12-31 MED ORDER — LIDOCAINE HCL (CARDIAC) 20 MG/ML IV SOLN
INTRAVENOUS | Status: AC
  Filled 2014-12-31: qty 5

## 2014-12-31 MED ORDER — DEXTROSE 5 % IV SOLN
3.0000 g | INTRAVENOUS | Status: DC
  Filled 2014-12-31: qty 3000

## 2014-12-31 MED ORDER — OXYCODONE HCL 5 MG PO TABS
5.0000 mg | ORAL_TABLET | ORAL | Status: DC | PRN
  Administered 2014-12-31 – 2015-01-03 (×10): 10 mg via ORAL
  Filled 2014-12-31 (×10): qty 2

## 2014-12-31 MED ORDER — LACTATED RINGERS IV SOLN
INTRAVENOUS | Status: DC
  Administered 2014-12-31: 11:00:00 via INTRAVENOUS

## 2014-12-31 MED ORDER — METOCLOPRAMIDE HCL 5 MG/ML IJ SOLN
5.0000 mg | Freq: Three times a day (TID) | INTRAMUSCULAR | Status: DC | PRN
Start: 2014-12-31 — End: 2015-01-03

## 2014-12-31 MED ORDER — HYDROMORPHONE HCL 1 MG/ML IJ SOLN
0.2500 mg | INTRAMUSCULAR | Status: DC | PRN
  Administered 2014-12-31 (×4): 0.5 mg via INTRAVENOUS

## 2014-12-31 MED ORDER — FENTANYL CITRATE (PF) 250 MCG/5ML IJ SOLN
INTRAMUSCULAR | Status: DC | PRN
  Administered 2014-12-31: 150 ug via INTRAVENOUS
  Administered 2014-12-31: 100 ug via INTRAVENOUS

## 2014-12-31 MED ORDER — HYDROMORPHONE HCL 1 MG/ML IJ SOLN
2.0000 mg | INTRAMUSCULAR | Status: DC | PRN
  Administered 2014-12-31: 1 mg via INTRAVENOUS
  Administered 2014-12-31: 2 mg via INTRAVENOUS
  Administered 2014-12-31: 1 mg via INTRAVENOUS
  Administered 2015-01-01 – 2015-01-02 (×6): 2 mg via INTRAVENOUS
  Filled 2014-12-31 (×8): qty 2

## 2014-12-31 MED ORDER — METHOCARBAMOL 1000 MG/10ML IJ SOLN
500.0000 mg | Freq: Four times a day (QID) | INTRAMUSCULAR | Status: DC | PRN
  Filled 2014-12-31 (×2): qty 5

## 2014-12-31 MED ORDER — ONDANSETRON HCL 4 MG/2ML IJ SOLN
INTRAMUSCULAR | Status: AC
  Filled 2014-12-31: qty 2

## 2014-12-31 MED ORDER — CEFAZOLIN SODIUM-DEXTROSE 2-3 GM-% IV SOLR: 2.0000 g | INTRAVENOUS | Status: DC

## 2014-12-31 MED ORDER — ONDANSETRON HCL 4 MG/2ML IJ SOLN: 4.0000 mg | Freq: Four times a day (QID) | INTRAMUSCULAR | Status: DC | PRN

## 2014-12-31 MED ORDER — 0.9 % SODIUM CHLORIDE (POUR BTL) OPTIME
TOPICAL | Status: DC | PRN
  Administered 2014-12-31: 1000 mL

## 2014-12-31 MED ORDER — SUCCINYLCHOLINE CHLORIDE 20 MG/ML IJ SOLN
INTRAMUSCULAR | Status: DC | PRN
  Administered 2014-12-31: 120 mg via INTRAVENOUS

## 2014-12-31 MED ORDER — ACETAMINOPHEN 325 MG PO TABS: 650.0000 mg | ORAL_TABLET | Freq: Four times a day (QID) | ORAL | Status: DC | PRN

## 2014-12-31 MED ORDER — ONDANSETRON HCL 4 MG PO TABS: 4.0000 mg | ORAL_TABLET | Freq: Four times a day (QID) | ORAL | Status: DC | PRN

## 2014-12-31 MED ORDER — FENTANYL CITRATE (PF) 250 MCG/5ML IJ SOLN
INTRAMUSCULAR | Status: AC
  Filled 2014-12-31: qty 5

## 2014-12-31 MED ORDER — SODIUM CHLORIDE 0.45 % IV SOLN
INTRAVENOUS | Status: DC
  Administered 2014-12-31: via INTRAVENOUS

## 2014-12-31 MED ORDER — METHOCARBAMOL 500 MG PO TABS
500.0000 mg | ORAL_TABLET | Freq: Four times a day (QID) | ORAL | Status: DC | PRN
  Administered 2014-12-31 – 2015-01-03 (×7): 500 mg via ORAL
  Filled 2014-12-31 (×7): qty 1

## 2014-12-31 MED ORDER — MIDAZOLAM HCL 2 MG/2ML IJ SOLN
INTRAMUSCULAR | Status: AC
  Filled 2014-12-31: qty 4

## 2014-12-31 MED ORDER — PROMETHAZINE HCL 25 MG/ML IJ SOLN: 6.2500 mg | INTRAMUSCULAR | Status: DC | PRN

## 2014-12-31 SURGICAL SUPPLY — 38 items
BLADE SAW RECIP 87.9 MT (BLADE) ×3 IMPLANT
BLADE SURG 21 STRL SS (BLADE) ×3 IMPLANT
BNDG COHESIVE 6X5 TAN STRL LF (GAUZE/BANDAGES/DRESSINGS) ×3 IMPLANT
BNDG GAUZE ELAST 4 BULKY (GAUZE/BANDAGES/DRESSINGS) ×3 IMPLANT
COVER SURGICAL LIGHT HANDLE (MISCELLANEOUS) ×6 IMPLANT
CUFF TOURNIQUET SINGLE 34IN LL (TOURNIQUET CUFF) IMPLANT
CUFF TOURNIQUET SINGLE 44IN (TOURNIQUET CUFF) IMPLANT
DRAPE EXTREMITY T 121X128X90 (DRAPE) ×3 IMPLANT
DRAPE PROXIMA HALF (DRAPES) ×6 IMPLANT
DRAPE U-SHAPE 47X51 STRL (DRAPES) ×3 IMPLANT
DRSG ADAPTIC 3X8 NADH LF (GAUZE/BANDAGES/DRESSINGS) ×3 IMPLANT
DRSG PAD ABDOMINAL 8X10 ST (GAUZE/BANDAGES/DRESSINGS) ×9 IMPLANT
DURAPREP 26ML APPLICATOR (WOUND CARE) ×3 IMPLANT
ELECT REM PT RETURN 9FT ADLT (ELECTROSURGICAL) ×3
ELECTRODE REM PT RTRN 9FT ADLT (ELECTROSURGICAL) ×1 IMPLANT
GAUZE SPONGE 4X4 12PLY STRL (GAUZE/BANDAGES/DRESSINGS) ×3 IMPLANT
GLOVE BIOGEL PI IND STRL 9 (GLOVE) ×1 IMPLANT
GLOVE BIOGEL PI INDICATOR 9 (GLOVE) ×2
GLOVE SURG ORTHO 9.0 STRL STRW (GLOVE) ×3 IMPLANT
GOWN STRL REUS W/ TWL XL LVL3 (GOWN DISPOSABLE) ×2 IMPLANT
GOWN STRL REUS W/TWL XL LVL3 (GOWN DISPOSABLE) ×6
KIT BASIN OR (CUSTOM PROCEDURE TRAY) ×3 IMPLANT
KIT ROOM TURNOVER OR (KITS) ×3 IMPLANT
MANIFOLD NEPTUNE II (INSTRUMENTS) ×3 IMPLANT
NS IRRIG 1000ML POUR BTL (IV SOLUTION) ×3 IMPLANT
PACK GENERAL/GYN (CUSTOM PROCEDURE TRAY) ×3 IMPLANT
PAD ARMBOARD 7.5X6 YLW CONV (MISCELLANEOUS) ×6 IMPLANT
SPONGE GAUZE 4X4 12PLY STER LF (GAUZE/BANDAGES/DRESSINGS) ×3 IMPLANT
SPONGE LAP 18X18 X RAY DECT (DISPOSABLE) IMPLANT
STAPLER VISISTAT 35W (STAPLE) ×3 IMPLANT
STOCKINETTE IMPERVIOUS LG (DRAPES) ×3 IMPLANT
SUT ETHILON 2 0 PSLX (SUTURE) ×6 IMPLANT
SUT SILK 2 0 (SUTURE) ×3
SUT SILK 2-0 18XBRD TIE 12 (SUTURE) ×1 IMPLANT
SUT VIC AB 1 CTX 27 (SUTURE) IMPLANT
TOWEL OR 17X24 6PK STRL BLUE (TOWEL DISPOSABLE) ×3 IMPLANT
TOWEL OR 17X26 10 PK STRL BLUE (TOWEL DISPOSABLE) ×3 IMPLANT
WATER STERILE IRR 1000ML POUR (IV SOLUTION) ×3 IMPLANT

## 2014-12-31 NOTE — Anesthesia Preprocedure Evaluation (Addendum)
Anesthesia Evaluation  Patient identified by MRN, date of birth, ID band Patient awake    Reviewed: Allergy & Precautions, NPO status , Patient's Chart, lab work & pertinent test results  Airway Mallampati: II  TM Distance: >3 FB Neck ROM: Full    Dental  (+) Dental Advisory Given   Pulmonary former smoker,  breath sounds clear to auscultation        Cardiovascular hypertension, + Peripheral Vascular Disease Rhythm:Regular Rate:Normal     Neuro/Psych negative neurological ROS     GI/Hepatic Neg liver ROS, GERD-  ,  Endo/Other  diabetes, Type 2, Oral Hypoglycemic AgentsMorbid obesity  Renal/GU Renal disease     Musculoskeletal   Abdominal   Peds  Hematology  (+) anemia ,   Anesthesia Other Findings   Reproductive/Obstetrics                           Anesthesia Physical Anesthesia Plan  ASA: III  Anesthesia Plan: General   Post-op Pain Management:    Induction: Intravenous  Airway Management Planned: Oral ETT  Additional Equipment:   Intra-op Plan:   Post-operative Plan: Extubation in OR  Informed Consent: I have reviewed the patients History and Physical, chart, labs and discussed the procedure including the risks, benefits and alternatives for the proposed anesthesia with the patient or authorized representative who has indicated his/her understanding and acceptance.     Plan Discussed with: CRNA  Anesthesia Plan Comments:        Anesthesia Quick Evaluation

## 2014-12-31 NOTE — Progress Notes (Signed)
Family Medicine Teaching Service Daily Progress Note Intern Pager: [phone]  Patient name: Ray Hall Medical record number: [id number] Date of birth: February 19, 1961 Age: 54 y.o. Gender: male  Primary Care Provider: Tawanna Sat, MD Consultants: Vascular surgery  Code Status: FULL  Pt Overview and Major Events to Date:  9/1: Admitted from [facility] with gas gangrene, Vanc/zosyn started 9/2: Vascular surgery and orthopedics evaluate 9/3: Transtibial amputation planned  Assessment and Plan: TYRIC RODEHEAVER is a 54 y.o. male with recent hospitalization on 12/16/2014 for concern of osteomyelitis (which was ruled out at that time and was treated for cellulitis with doxycycline) admitted from clinic 9/1 for management of wet gangrene from diabetic foot ulcer. PMH is significant for type 2 DM, HTN, BPH, diabetic foot ulcer, GERD, and atrial flutter.   Ostemyelitis and wet gangrene due to diabetic foot ulceration: Leukocytosis stable, reactive thrombocytosis noted and stable - Vanc zosyn (9/1 >> ) - Vascular and orthopedics for transtibial amputation and tibial plate hardware removal 9/3  - Would appreciate rec's Re: post-op DVT ppx - WOC - Will need PT/OT - Morphine 1mg  IV q2h prn - blood cultures pending  - Daily CBC  HTN: Not at long-term goal (146/81 this AM) on lisinopril 20mg  (was continued, as no AKI).  - Hold ACE perioperatively as no CHF indication and HTN not severe. Will need to revisit this on 9/4. - Hydralazine 10mg  IV q2h prn to maintain SBP < 150 and DBP < 100  DM2: Last A1c 7.4 8/19, on glipizide, metformin at home - SSI - CBG ACHS  Adjustment reaction: Progressing to acceptance slowly.  - Continue pastoral care as desired  History of atrial flutter 2012 s/p ablation in 2012 at [facility], currently in NSR, no symptoms of palpitations - monitor closely - no need for telemetry at this time  GERD:  -PPI started  FEN/GI: 1/2NS @ 75 cc/hr Prophylaxis:  SCDs  Disposition: To OR  Subjective:  Still in grief about loss of distal extremity today but has come to accept what needs to be done. No fevers, leg pain, dyspnea today. Appreciates chaplain visiting  Objective: Temp:  [98.4 F (36.9 C)-98.8 F (37.1 C)] 98.8 F (37.1 C) (09/03 0420) Pulse Rate:  [74-89] 82 (09/03 0420) Resp:  [16-20] 20 (09/03 0420) BP: (141-146)/(71-81) 146/81 mmHg (09/03 0420) SpO2:  [100 %] 100 % (09/03 0420) Physical Exam: General: Large 54 yo male in no distress sitting on side of the bed.  Cardiovascular: RRR, no murmurs, rubs, or gallops  Respiratory: CTAB, normal respiratory effort, no crackles, and no wheezes.  Abdomen: Bowel sounds normal; abdomen soft and nontender Extremities: Left foot wound dressed in gauze with some yellow discharge. No sensation over left foot. No hair noted on LE bilaterally and venous stasis dermatitis. Palpable nontender cord on left calf. Negative homan's. Poor pulses on right with normal calf.  Psyc: Despondent, congruently limited affect.   Laboratory:  Recent Labs Lab 12/30/14 1104 12/31/14 0131 12/31/14 0308  WBC 11.9* 12.9* 11.1*  HGB 8.8* 8.9* 8.6*  HCT 26.8* 27.2* 25.8*  PLT PLATELET CLUMPS NOTED ON SMEAR, UNABLE TO ESTIMATE 654* 583*    Recent Labs Lab 12/29/14 1430 12/31/14 0131  NA 137 132*  K 4.3 4.3  CL 105 100*  CO2 23 22  BUN <5* 8  CREATININE 0.76 0.95  CALCIUM 9.0 9.0  PROT 7.5 8.0  BILITOT 0.6 0.6  ALKPHOS 83 86  ALT 17 14*  AST 29 31  GLUCOSE 55* 190*  Imaging/Diagnostic Tests: MRI 9/1: worsening appearance with new soft tissue gas at the level of the second, third and fourth MTP joints. New marrow edema about the second, third and fourth MTP joints appears worst at the second and third and is consistent with osteomyelitis. Small focus of osteomyelitis in the plantar surface of the head of the first metatarsal  Patrecia Pour, MD 12/31/2014, 7:35 AM PGY-3, [place] Intern pager: [phone], text pages welcome

## 2014-12-31 NOTE — Interval H&P Note (Signed)
History and Physical Interval Note:  12/31/2014 7:08 AM  Ray Hall  has presented today for surgery, with the diagnosis of gangrene  The various methods of treatment have been discussed with the patient and family. After consideration of risks, benefits and other options for treatment, the patient has consented to  Procedure(s): AMPUTATION BELOW KNEE (Left) as a surgical intervention .  The patient's history has been reviewed, patient examined, no change in status, stable for surgery.  I have reviewed the patient's chart and labs.  Questions were answered to the patient's satisfaction.     DUDA,MARCUS V

## 2014-12-31 NOTE — H&P (View-Only) (Signed)
Reason for Consult: Gangrene left foot Referring Physician: Dr. Lennette Bihari supple  Ray Hall is an 54 y.o. male.  HPI: Patient is a 54 year old gentleman with diabetic insensate neuropathy patient is status post a reported intramedullary nailing of the left tibia. He states he's been followed by family practice with his left foot since July. Patient presents at this time with gangrene including the forefoot and midfoot with an MRI scan showing osteomyelitis of both the forefoot and midfoot.  Past Medical History  Diagnosis Date  . Sickle cell trait   . Normal nuclear stress test 07/2010    low prob of ischemia, EF 42%  . Echocardiogram abnormal     moderate LVH, mild LV hypokenesis, EF 42%  . History of Doppler ultrasound 07/2010    negative for DVT  . Abnormal CT of the abdomen     mild L hydronephrosis and hydroureter  . Status post radiofrequency ablation for arrhythmia 10/16/10    [facility] Rucks MD)  . Diabetes mellitus   . Hypertension   . Osteomyelitis 12/2014    LEFT FOOT    Past Surgical History  Procedure Laterality Date  . Radiofrequency ablation  10/16/10    Cardiac for atrial arrythmia, [facility]  . Dental extractions  10/15/10    prior to ablation  . Patella fracture surgery      metal rod left leg  . Tonsillectomy    . Rotator cuff repair Left 10/2014    Family History  Problem Relation Age of Onset  . Sickle cell trait    . Heart failure Father   . Diabetes Father   . Diabetes Mother   . Hypertension Mother     Social History:  reports that he quit smoking about 2 weeks ago. His smoking use included Cigars. He has never used smokeless tobacco. He reports that he does not drink alcohol or use illicit drugs.  Allergies:  Allergies  Allergen Reactions  . Lactose Intolerance (Gi) Other (See Comments)    Gas & heartburn    Medications: I have reviewed the patient's current medications.  Results for orders placed or performed during the hospital encounter of  12/29/14 (from the past 48 hour(s))  MRSA PCR Screening     Status: None   Collection Time: 12/29/14 12:31 PM  Result Value Ref Range   MRSA by PCR NEGATIVE NEGATIVE    Comment:        The GeneXpert MRSA Assay (FDA approved for NASAL specimens only), is one component of a comprehensive MRSA colonization surveillance program. It is not intended to diagnose MRSA infection nor to guide or monitor treatment for MRSA infections.   Comprehensive metabolic panel     Status: Abnormal   Collection Time: 12/29/14  2:30 PM  Result Value Ref Range   Sodium 137 135 - 145 mmol/L   Potassium 4.3 3.5 - 5.1 mmol/L   Chloride 105 101 - 111 mmol/L   CO2 23 22 - 32 mmol/L   Glucose, Bld 55 (L) 65 - 99 mg/dL   BUN <5 (L) 6 - 20 mg/dL   Creatinine, Ser 0.76 0.61 - 1.24 mg/dL   Calcium 9.0 8.9 - 10.3 mg/dL   Total Protein 7.5 6.5 - 8.1 g/dL   Albumin 2.9 (L) 3.5 - 5.0 g/dL   AST 29 15 - 41 U/L   ALT 17 17 - 63 U/L   Alkaline Phosphatase 83 38 - 126 U/L   Total Bilirubin 0.6 0.3 - 1.2 mg/dL  GFR calc non Af Amer >60 >60 mL/min   GFR calc Af Amer >60 >60 mL/min    Comment: (NOTE) The eGFR has been calculated using the CKD EPI equation. This calculation has not been validated in all clinical situations. eGFR's persistently <60 mL/min signify possible Chronic Kidney Disease.    Anion gap 9 5 - 15  CBC WITH DIFFERENTIAL     Status: Abnormal   Collection Time: 12/29/14  2:30 PM  Result Value Ref Range   WBC 11.6 (H) 4.0 - 10.5 K/uL   RBC 3.25 (L) 4.22 - 5.81 MIL/uL   Hemoglobin 8.7 (L) 13.0 - 17.0 g/dL   HCT 26.0 (L) 39.0 - 52.0 %   MCV 80.0 78.0 - 100.0 fL   MCH 26.8 26.0 - 34.0 pg   MCHC 33.5 30.0 - 36.0 g/dL   RDW 15.5 11.5 - 15.5 %   Platelets 625 (H) 150 - 400 K/uL   Neutrophils Relative % 59 43 - 77 %   Lymphocytes Relative 33 12 - 46 %   Monocytes Relative 7 3 - 12 %   Eosinophils Relative 1 0 - 5 %   Basophils Relative 0 0 - 1 %   Neutro Abs 6.9 1.7 - 7.7 K/uL   Lymphs Abs 3.8  0.7 - 4.0 K/uL   Monocytes Absolute 0.8 0.1 - 1.0 K/uL   Eosinophils Absolute 0.1 0.0 - 0.7 K/uL   Basophils Absolute 0.0 0.0 - 0.1 K/uL   RBC Morphology POLYCHROMASIA PRESENT    WBC Morphology ATYPICAL LYMPHOCYTES   Glucose, capillary     Status: Abnormal   Collection Time: 12/29/14  4:05 PM  Result Value Ref Range   Glucose-Capillary 59 (L) 65 - 99 mg/dL  Glucose, capillary     Status: None   Collection Time: 12/29/14  4:29 PM  Result Value Ref Range   Glucose-Capillary 92 65 - 99 mg/dL  Glucose, capillary     Status: Abnormal   Collection Time: 12/29/14 10:16 PM  Result Value Ref Range   Glucose-Capillary 138 (H) 65 - 99 mg/dL  Glucose, capillary     Status: Abnormal   Collection Time: 12/30/14  8:11 AM  Result Value Ref Range   Glucose-Capillary 125 (H) 65 - 99 mg/dL  CBC     Status: Abnormal   Collection Time: 12/30/14 11:04 AM  Result Value Ref Range   WBC 11.9 (H) 4.0 - 10.5 K/uL    Comment: WHITE COUNT CONFIRMED ON SMEAR   RBC 3.37 (L) 4.22 - 5.81 MIL/uL   Hemoglobin 8.8 (L) 13.0 - 17.0 g/dL   HCT 26.8 (L) 39.0 - 52.0 %   MCV 79.5 78.0 - 100.0 fL   MCH 26.1 26.0 - 34.0 pg   MCHC 32.8 30.0 - 36.0 g/dL   RDW 15.7 (H) 11.5 - 15.5 %   Platelets PLATELET CLUMPS NOTED ON SMEAR, UNABLE TO ESTIMATE 150 - 400 K/uL  Glucose, capillary     Status: Abnormal   Collection Time: 12/30/14 11:59 AM  Result Value Ref Range   Glucose-Capillary 116 (H) 65 - 99 mg/dL  Glucose, capillary     Status: Abnormal   Collection Time: 12/30/14  4:38 PM  Result Value Ref Range   Glucose-Capillary 109 (H) 65 - 99 mg/dL    Mr Foot Left W Wo Contrast  12/29/2014   CLINICAL DATA:  Worsening diabetic foot ulceration of the patient's first through third digits appearing gangrenous with purulent discharge between the first and second toes.  Plantar skin ulceration.  EXAM: MRI OF THE LEFT FOREFOOT WITHOUT AND WITH CONTRAST  TECHNIQUE: Multiplanar, multisequence MR imaging was performed both before and  after administration of intravenous contrast.  CONTRAST:  20 mL MULTIHANCE GADOBENATE DIMEGLUMINE 529 MG/ML IV SOLN  COMPARISON:  MRI of the left foot 12/16/2014.  FINDINGS: Skin ulceration is seen on the plantar surface left foot along the medial margin of the first metatarsal head. There is intense subcutaneous edema enhancement about the foot consistent with cellulitis. Extensive soft tissue gas is seen on the plantar surface of the foot centered at the level of the second, third and fourth MTP joints. No rim enhancing fluid collection is identified.  There is new marrow edema and enhancement in the heads of the second, third and fourth metatarsals and bases of the proximal phalanges of the second, third and fourth toes. Signal change and enhancement appear worst about the second and third MTP joints. Also seen is a new small focus of marrow edema and enhancement in the plantar surface of the first metatarsal head between the sesamoid bones. The great and little toes are spared.  IMPRESSION: Marked worsening in the appearance of the foot with new soft tissue gas in the plantar soft tissues at the level of the second, third and fourth MTP joints. No abscess is identified.  New marrow edema about the second, third and fourth MTP joints appears worst at the second and third and is consistent with osteomyelitis. There is likely a small focus of osteomyelitis in the plantar surface of the head of the first metatarsal.  Intense edema and enhancement over the dorsum of the foot consistent with cellulitis.   Electronically Signed   By: Inge Rise M.D.   On: 12/29/2014 19:40    Review of Systems  All other systems reviewed and are negative.  Blood pressure 141/78, pulse 74, temperature 98.4 F (36.9 C), temperature source Oral, resp. rate 16, height 6' 3"  (1.905 m), SpO2 100 %. Physical Exam On examination patient has dry gangrenous changes with purulent drainage from the toes the gangrene extends to the  mid foot. Review of the MRI scan shows osteomyelitis involving the metatarsals. Ankle-brachial indices were also reviewed. Vascular surgery is recommending a transtibial amputation. Radiographs of the tibia are not available. 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  Plan: I will request radiographs of the tibia. Plan for a left transtibial amputation and removal of the intramedullary nail Saturday morning. Risks and benefits were discussed. Patient may be  safe for discharge to home and may require discharge to rehabilitation.  DUDA,MARCUS V 12/30/2014, 4:47 PM

## 2014-12-31 NOTE — Anesthesia Procedure Notes (Signed)
Procedure Name: Intubation Date/Time: 12/31/2014 1:26 PM Performed by: Marinda Elk A Pre-anesthesia Checklist: Patient identified, Emergency Drugs available, Suction available, Patient being monitored and Timeout performed Patient Re-evaluated:Patient Re-evaluated prior to inductionOxygen Delivery Method: Circle system utilized Preoxygenation: Pre-oxygenation with 100% oxygen Intubation Type: IV induction Ventilation: Mask ventilation without difficulty Laryngoscope Size: Mac and 3 Grade View: Grade I Tube type: Oral Tube size: 7.5 mm Number of attempts: 1 Airway Equipment and Method: Stylet Placement Confirmation: ETT inserted through vocal cords under direct vision Secured at: 22 cm Tube secured with: Tape Dental Injury: Teeth and Oropharynx as per pre-operative assessment

## 2014-12-31 NOTE — Progress Notes (Signed)
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 Has been able to tolerate PO  O: BP 153/88 mmHg  Pulse 90  Temp(Src) 98.7 F (37.1 C) (Oral)  Resp 18  Ht 6\' 3"  (1.905 m)  SpO2 98%  Exam: Gen NAD, lying in bed Pulm: CTAB CV: RRR Abd: soft, non tender, + BS Extremity: left LE wound dressing c/d/i, no right LE edema  A/P POD 0 s/p left transtibial amputation, stable - Post op pain- oxycodone 5-10 mg q3, dilaudid q2 PRN- continue to monitor closely - follow post op labs AM 9/4 - Reg diet - Vascular and ortho following   Ray Hall A. Lincoln Brigham MD, [facility] Resident PGY-2 Pager [phone]

## 2014-12-31 NOTE — Transfer of Care (Signed)
Immediate Anesthesia Transfer of Care Note  Patient: Ray Hall  Procedure(s) Performed: Procedure(s): AMPUTATION BELOW KNEE (Left)  Patient Location: PACU  Anesthesia Type:General  Level of Consciousness: awake, alert , oriented and patient cooperative  Airway & Oxygen Therapy: Patient Spontanous Breathing and Patient connected to nasal cannula oxygen  Post-op Assessment: Report given to RN and Post -op Vital signs reviewed and stable  Post vital signs: Reviewed and stable  Last Vitals:  Filed Vitals:   12/31/14 1426  BP: 121/77  Pulse: 94  Temp: 36.9 C  Resp: 14    Complications: No apparent anesthesia complications

## 2014-12-31 NOTE — Anesthesia Postprocedure Evaluation (Signed)
  Anesthesia Post-op Note  Patient: Ray Hall  Procedure(s) Performed: Procedure(s): AMPUTATION BELOW KNEE (Left)  Patient Location: PACU  Anesthesia Type:General  Level of Consciousness: awake, alert  and oriented  Airway and Oxygen Therapy: Patient Spontanous Breathing  Post-op Pain: mild  Post-op Assessment: Post-op Vital signs reviewed              Post-op Vital Signs: Reviewed  Last Vitals:  Filed Vitals:   12/31/14 1515  BP: 140/79  Pulse: 90  Temp:   Resp: 17    Complications: No apparent anesthesia complications

## 2014-12-31 NOTE — Op Note (Signed)
   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  PROCEDURE: Transtibial amputation  SURGEON: Sharol Given, M.D.  ANESTHESIA:  general  IV FLUIDS AND URINE: See anesthesia.  ESTIMATED BLOOD LOSS: Minimal mL.  COMPLICATIONS: None.  DESCRIPTION OF PROCEDURE: The patient was brought to the operating room and underwent a general anesthetic. After adequate levels of anesthesia were obtained patient's lower extremity was prepped using DuraPrep draped into a sterile field. A timeout was called.  A transverse incision was made 11 cm distal to the tibial tubercle. This curved proximally and a large posterior flap was created. The tibia was transected 1 cm proximal to the skin incision. The fibula was transected just proximal to the tibial incision. The tibia was beveled anteriorly. A large posterior flap was created. The sciatic nerve was pulled cut and allowed to retract. The vascular bundles were suture ligated with 2-0 silk. The deep and superficial fascial layers were closed using #1 Vicryl. The skin was closed using staples and 2-0 nylon. The wound was covered with Adaptic orthopedic sponges AB dressing Kerlix and Coban. Patient was extubated taken to the PACU in stable condition.  Ray Score, MD [place] 2:29 PM

## 2015-01-01 DIAGNOSIS — T847XXD Infection and inflammatory reaction due to other internal orthopedic prosthetic devices, implants and grafts, subsequent encounter: Secondary | ICD-10-CM

## 2015-01-01 LAB — GLUCOSE, CAPILLARY
GLUCOSE-CAPILLARY: 168 mg/dL — AB (ref 65–99)
GLUCOSE-CAPILLARY: 218 mg/dL — AB (ref 65–99)
GLUCOSE-CAPILLARY: 229 mg/dL — AB (ref 65–99)
Glucose-Capillary: 323 mg/dL — ABNORMAL HIGH (ref 65–99)

## 2015-01-01 LAB — BASIC METABOLIC PANEL
Anion gap: 8 (ref 5–15)
BUN: 8 mg/dL (ref 6–20)
CALCIUM: 8.5 mg/dL — AB (ref 8.9–10.3)
CHLORIDE: 101 mmol/L (ref 101–111)
CO2: 24 mmol/L (ref 22–32)
CREATININE: 1.15 mg/dL (ref 0.61–1.24)
Glucose, Bld: 178 mg/dL — ABNORMAL HIGH (ref 65–99)
Potassium: 4.2 mmol/L (ref 3.5–5.1)
SODIUM: 133 mmol/L — AB (ref 135–145)

## 2015-01-01 LAB — CBC
HCT: 24.6 % — ABNORMAL LOW (ref 39.0–52.0)
HEMOGLOBIN: 8.1 g/dL — AB (ref 13.0–17.0)
MCH: 26.3 pg (ref 26.0–34.0)
MCHC: 32.9 g/dL (ref 30.0–36.0)
MCV: 79.9 fL (ref 78.0–100.0)
PLATELETS: 515 10*3/uL — AB (ref 150–400)
RBC: 3.08 MIL/uL — ABNORMAL LOW (ref 4.22–5.81)
RDW: 15.5 % (ref 11.5–15.5)
WBC: 11.3 10*3/uL — ABNORMAL HIGH (ref 4.0–10.5)

## 2015-01-01 MED ORDER — DIPHENHYDRAMINE HCL 25 MG PO CAPS
25.0000 mg | ORAL_CAPSULE | Freq: Once | ORAL | Status: AC
  Administered 2015-01-01: 25 mg via ORAL
  Filled 2015-01-01: qty 1

## 2015-01-01 NOTE — Progress Notes (Signed)
ANTIBIOTIC CONSULT NOTE - FOLLOW UP  Pharmacy Consult for Vancomycin and Zosyn Indication: osteomyelitis  Allergies  Allergen Reactions  . Lactose Intolerance (Gi) Other (See Comments)    Gas & heartburn    Patient Measurements: Height: 6\' 3"  (190.5 cm) IBW/kg (Calculated) : 84.5  Vital Signs: Temp: 98.6 F (37 C) (09/04 0610) Temp Source: Oral (09/04 0610) BP: 131/86 mmHg (09/04 0610) Pulse Rate: 90 (09/04 0610) Intake/Output from previous day: 09/03 0701 - 09/04 0700 In: 1620 [P.O.:720; I.V.:900] Out: 1000 [Urine:950; Blood:50] Intake/Output from this shift:    Labs:  Recent Labs  12/29/14 1430  12/31/14 0131 12/31/14 0308 01/01/15 0700  WBC 11.6*  < > 12.9* 11.1* 11.3*  HGB 8.7*  < > 8.9* 8.6* 8.1*  PLT 625*  < > 654* 583* 515*  CREATININE 0.76  --  0.95  --  1.15  < > = values in this interval not displayed. Estimated Creatinine Clearance: 116.1 mL/min (by C-G formula based on Cr of 1.15). No results for input(s): VANCOTROUGH, VANCOPEAK, VANCORANDOM, GENTTROUGH, GENTPEAK, GENTRANDOM, TOBRATROUGH, TOBRAPEAK, TOBRARND, AMIKACINPEAK, AMIKACINTROU, AMIKACIN in the last 72 hours.   Microbiology: Recent Results (from the past 720 hour(s))  Blood culture (routine x 2)     Status: None   Collection Time: 12/16/14  4:20 PM  Result Value Ref Range Status   Specimen Description BLOOD RIGHT FOREARM  Final   Special Requests BOTTLES DRAWN AEROBIC AND ANAEROBIC 5CC  Final   Culture NO GROWTH 5 DAYS  Final   Report Status 12/21/2014 FINAL  Final  Blood culture (routine x 2)     Status: None   Collection Time: 12/16/14  4:25 PM  Result Value Ref Range Status   Specimen Description BLOOD LEFT ARM  Final   Special Requests BOTTLES DRAWN AEROBIC AND ANAEROBIC 5CC  Final   Culture NO GROWTH 5 DAYS  Final   Report Status 12/21/2014 FINAL  Final  Urine culture     Status: None   Collection Time: 12/16/14 10:29 PM  Result Value Ref Range Status   Specimen Description  URINE, CLEAN CATCH  Final   Special Requests NONE  Final   Culture >=100,000 COLONIES/mL YEAST  Final   Report Status 12/18/2014 FINAL  Final  Culture, blood (routine x 2)     Status: None   Collection Time: 12/18/14 11:00 PM  Result Value Ref Range Status   Specimen Description BLOOD LEFT ANTECUBITAL  Final   Special Requests BOTTLES DRAWN AEROBIC AND ANAEROBIC 10CC  Final   Culture NO GROWTH 5 DAYS  Final   Report Status 12/23/2014 FINAL  Final  Culture, blood (routine x 2)     Status: None   Collection Time: 12/18/14 11:05 PM  Result Value Ref Range Status   Specimen Description BLOOD RIGHT HAND  Final   Special Requests BOTTLES DRAWN AEROBIC AND ANAEROBIC 10CC  Final   Culture NO GROWTH 5 DAYS  Final   Report Status 12/23/2014 FINAL  Final  MRSA PCR Screening     Status: None   Collection Time: 12/29/14 12:31 PM  Result Value Ref Range Status   MRSA by PCR NEGATIVE NEGATIVE Final    Comment:        The GeneXpert MRSA Assay (FDA approved for NASAL specimens only), is one component of a comprehensive MRSA colonization surveillance program. It is not intended to diagnose MRSA infection nor to guide or monitor treatment for MRSA infections.   Culture, blood (routine x 2)  Status: None (Preliminary result)   Collection Time: 12/29/14  2:20 PM  Result Value Ref Range Status   Specimen Description BLOOD RIGHT ARM  Final   Special Requests IN PEDIATRIC BOTTLE 1CC  Final   Culture NO GROWTH 2 DAYS  Final   Report Status PENDING  Incomplete  Culture, blood (routine x 2)     Status: None (Preliminary result)   Collection Time: 12/29/14  2:30 PM  Result Value Ref Range Status   Specimen Description BLOOD LEFT HAND  Final   Special Requests BOTTLES DRAWN AEROBIC ONLY 2CCS  Final   Culture NO GROWTH 2 DAYS  Final   Report Status PENDING  Incomplete  Surgical pcr screen     Status: None   Collection Time: 12/31/14  6:29 AM  Result Value Ref Range Status   MRSA, PCR NEGATIVE  NEGATIVE Final   Staphylococcus aureus NEGATIVE NEGATIVE Final    Comment:        The Xpert SA Assay (FDA approved for NASAL specimens in patients over 36 years of age), is one component of a comprehensive surveillance program.  Test performance has been validated by [facility] for patients greater than or equal to 49 year old. It is not intended to diagnose infection nor to guide or monitor treatment.     Anti-infectives    Start     Dose/Rate Route Frequency Ordered Stop   12/31/14 1100  ceFAZolin (ANCEF) 3 g in dextrose 5 % 50 mL IVPB  Status:  Discontinued     3 g 160 mL/hr over 30 Minutes Intravenous To Surgery 12/31/14 1050 12/31/14 1551   12/31/14 1000  ceFAZolin (ANCEF) IVPB 2 g/50 mL premix  Status:  Discontinued     2 g 100 mL/hr over 30 Minutes Intravenous To Surgery 12/31/14 0011 12/31/14 1050   12/30/14 0200  vancomycin (VANCOCIN) 1,500 mg in sodium chloride 0.9 % 500 mL IVPB     1,500 mg 250 mL/hr over 120 Minutes Intravenous Every 12 hours 12/29/14 1248     12/29/14 2000  piperacillin-tazobactam (ZOSYN) IVPB 3.375 g     3.375 g 12.5 mL/hr over 240 Minutes Intravenous Every 8 hours 12/29/14 1248     12/29/14 1430  vancomycin (VANCOCIN) 2,000 mg in sodium chloride 0.9 % 500 mL IVPB     2,000 mg 250 mL/hr over 120 Minutes Intravenous  Once 12/29/14 1248 12/29/14 1723   12/29/14 1400  piperacillin-tazobactam (ZOSYN) IVPB 3.375 g     3.375 g 100 mL/hr over 30 Minutes Intravenous  Once 12/29/14 1248 12/29/14 1551      Assessment: 54 year old male on Day #4 of Vancomycin and Zosyn for osteomyelitis.  He is POD #1 s/p transtibial amputation.  Based on charting it appears he missed his afternoon dose of Vancomycin while in the OR for surgery on 9/3, so he is no longer at steady state with his Vancomycin therapy.  His SCr is rising slightly.  Goal of Therapy:  Vancomycin trough level 15-20 mcg/ml  Plan:  Continue Zosyn 3.375gm IV q8h extended infusion Continue  Vancomycin 1500mg  IV q12h Defer Vancomycin trough until he is back at steady state (4-5 doses). Follow-up antibiotic plans now that he is post-amputation Check BMET with AM labs to monitor SCr rise.  Legrand Como, Pharm.D., BCPS, AAHIVP Clinical Pharmacist Phone: [phone] or [phone] 01/01/2015, 10:11 AM

## 2015-01-01 NOTE — Progress Notes (Signed)
Patient ID: Ray Hall, male   DOB: [birth year], 54 y.o.   MRN: [id number] Postoperative day 1 left transtibial amputation. Patient has full active extension. Physical therapy progressive ambulation anticipate discharge to home once patient is independently with transfers.

## 2015-01-01 NOTE — Evaluation (Signed)
Physical Therapy Evaluation Patient Details Name: Ray Hall MRN: [id number] DOB: [birth year] Today's Date: 01/01/2015   History of Present Illness  Pt is a 54 y/o M s/p Lt BKA.  Pt's PMH includes DM and HTN.   Clinical Impression  Patient is s/p above surgery resulting in functional limitations due to the deficits listed below (see PT Problem List). Although anxious, Ray Hall performed sit<>stand from an elevated surface and stand pivot w/ min assist this session.  He is highly motivated to become as independent as possible and has fantastic support from his wife at home.  Recommending CIR, anticipate pt to reach mod I level w/ additional PT/OT.  Patient will benefit from skilled PT to increase their independence and safety with mobility to allow discharge to the venue listed below.      Follow Up Recommendations CIR    Equipment Recommendations  Rolling walker with 5" wheels (WC?)    Recommendations for Other Services OT consult;Rehab consult     Precautions / Restrictions Precautions Precautions: Fall Precaution Comments: Reviewed importance of maintaining Lt knee extension Restrictions Weight Bearing Restrictions: Yes LLE Weight Bearing: Non weight bearing      Mobility  Bed Mobility               General bed mobility comments: Pt standing EOB w/ nurse in room upon PT arrival  Transfers Overall transfer level: Needs assistance Equipment used: Rolling walker (2 wheeled) Transfers: Sit to/from [facility] Sit to Stand: Min assist;From elevated surface Stand pivot transfers: Min assist       General transfer comment: Min assist to stabilize RW.  Pt performs sit<>stand x4 from bed at lower heigh each try.  Min assist during stand pivot to stabilize walker and to provide pt directional cues.  Pillows placed on recliner chair to elevate seat.  Ambulation/Gait                Stairs            Wheelchair Mobility    Modified Rankin  (Stroke Patients Only)       Balance Overall balance assessment: Needs assistance Sitting-balance support: Feet supported;Bilateral upper extremity supported Sitting balance-Leahy Scale: Good     Standing balance support: Bilateral upper extremity supported;During functional activity Standing balance-Leahy Scale: Poor Standing balance comment: Relies heavily on RW for support                             Pertinent Vitals/Pain Pain Assessment: Faces Pain Score:  ("it's pretty good") Faces Pain Scale: Hurts little more Pain Location: Lt LE Pain Descriptors / Indicators: Grimacing;Guarding;Throbbing Pain Intervention(s): Limited activity within patient's tolerance;Monitored during session;Repositioned    Home Living Family/patient expects to be discharged to:: Inpatient rehab Living Arrangements: Spouse/significant other Available Help at Discharge: Family Type of Home: House Home Access: Stairs to enter Entrance Stairs-Rails: None Entrance Stairs-Number of Steps: 1 Home Layout: One level Home Equipment: Cane - single point      Prior Function Level of Independence: Needs assistance   Gait / Transfers Assistance Needed: Ind PTA  ADL's / Homemaking Assistance Needed: needed assist w/ donning/doffing shoes on Lt LE        Hand Dominance        Extremity/Trunk Assessment   Upper Extremity Assessment: Overall WFL for tasks assessed           Lower Extremity Assessment: LLE deficits/detail   LLE Deficits /  Details: weakness as expected s/p Lt BKA     Communication   Communication: No difficulties  Cognition Arousal/Alertness: Awake/alert Behavior During Therapy: WFL for tasks assessed/performed;Anxious Overall Cognitive Status: Within Functional Limits for tasks assessed                      General Comments General comments (skin integrity, edema, etc.): Pt's wife and sister in law present throughout duration of session and involved w/  all education.  Educated pt on maintaining Lt knee extension and applying light pressure to distal Lt LE to dec sensation of phantom limb pain.  Educated pt and pt's wife (demonstration and verbally) and Therapist, sports (verbally) about AP transfer to get back to bed if pt unable to stand from recliner.    Exercises Amputee Exercises Quad Sets: Strengthening;Both;10 reps;Seated Gluteal Sets: Strengthening;Both;10 reps;Seated Knee Extension: AROM;Left;10 reps;Seated      Assessment/Plan    PT Assessment Patient needs continued PT services  PT Diagnosis Difficulty walking;Abnormality of gait;Generalized weakness;Acute pain   PT Problem List Decreased strength;Decreased range of motion;Decreased activity tolerance;Decreased balance;Decreased mobility;Decreased knowledge of use of DME;Decreased safety awareness;Decreased knowledge of precautions;Decreased skin integrity;Pain  PT Treatment Interventions DME instruction;Gait training;Stair training;Functional mobility training;Therapeutic activities;Therapeutic exercise;Balance training;Neuromuscular re-education;Patient/family education;Modalities;Wheelchair mobility training   PT Goals (Current goals can be found in the Care Plan section) Acute Rehab PT Goals Patient Stated Goal: to go to rehab then home PT Goal Formulation: With patient/family Time For Goal Achievement: 01/15/15 Potential to Achieve Goals: Good    Frequency Min 5X/week   Barriers to discharge Inaccessible home environment 1 step to enter home    Co-evaluation               End of Session Equipment Utilized During Treatment: Gait belt Activity Tolerance: Patient tolerated treatment well Patient left: in chair;with call bell/phone within reach;with family/visitor present Nurse Communication: Mobility status;Precautions;Weight bearing status (technique to assist pt back to bed)         Time: 5621-3086 PT Time Calculation (min) (ACUTE ONLY): 26 min   Charges:   PT  Evaluation $Initial PT Evaluation Tier I: 1 Procedure PT Treatments $Therapeutic Activity: 8-22 mins   PT G Codes:       Joslyn Hy PT, DPT [phone] Pager: [phone] 01/01/2015, 2:12 PM

## 2015-01-01 NOTE — Progress Notes (Signed)
Family Medicine Teaching Service Daily Progress Note Intern Pager: [phone]  Patient name: Ray Hall Medical record number: [id number] Date of birth: 11/22/1960 Age: 54 y.o. Gender: male  Primary Care Provider: Tawanna Sat, MD Consultants: Vascular Surgery, Orthopedics Code Status: FULL  Pt Overview and Major Events to Date:  9/1: Admitted from [facility] with gas gangrene, Vanc/zosyn started 9/2: Vascular surgery and orthopedics evaluate 9/3: Transtibial amputation  9/4: D/c antibiotics   Assessment and Plan: Ray Hall is a 53 y.o. male with recent hospitalization on 12/16/2014 for concern of osteomyelitis (which was ruled out at that time and was treated for cellulitis with doxycycline) admitted from clinic 9/1 for management of wet gangrene from diabetic foot ulcer. 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  PMH is significant for type 2 DM, HTN, BPH, diabetic foot ulcer, GERD, and atrial flutter.   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 - will discontinue antibiotics and monitor - Vascular and orthopedics for transtibial amputation and tibial plate hardware removal 9/3 - Would appreciate rec's Re: post-op DVT ppx  - WOC - PT consulted - Morphine 1mg  IV q2h prn - blood cultures pending  - Daily CBC  HTN: Not at long-term goal (146/81 this AM) on lisinopril 20mg  (was continued, as no AKI). Ace held for surgery. Cr rise this morning, so will hold lisinopril  - holding home lisinopril  - Hydralazine 10mg  IV q2h prn to maintain SBP < 150 and DBP < 100  AKI: bump in Cr to 1.15 from 0.95.  - BMP AM   DM2: Last A1c 7.4 8/19, on glipizide, metformin at home - SSI - CBG ACHS  Adjustment reaction: Progressing to acceptance slowly.  - Continue pastoral care as desired  History of atrial flutter 2012 s/p ablation in 2012 at [facility],  currently in NSR, no symptoms of palpitations - monitor closely - no need for telemetry at this time  GERD:  -PPI started  FEN/GI: KVO; Ensure Prophylaxis: SCDs  Dispo:  - f/u ortho reccs  - will monitor off antibiotics and wait for PT eval. Anticipate discharge 9/5  Subjective:  Patient doing okay today. Notes of burning pain on left stump and intermittent muscle spasms. No CP, SOB, N/V, constipation.   Objective: Temp:  [98.1 F (36.7 C)-98.7 F (37.1 C)] 98.6 F (37 C) (09/04 0610) Pulse Rate:  [88-98] 90 (09/04 0610) Resp:  [10-20] 19 (09/04 0610) BP: (121-153)/(77-88) 131/86 mmHg (09/04 0610) SpO2:  [95 %-99 %] 97 % (09/04 0610) Physical Exam: General: NAD, lying in bed  Cardiovascular: RRR Respiratory: CTAB Abdomen:soft, non tender, + BS Extremities: left LE wound dressing c/d/i, right LE: no edema   Laboratory:  Recent Labs Lab 12/31/14 0131 12/31/14 0308 01/01/15 0700  WBC 12.9* 11.1* 11.3*  HGB 8.9* 8.6* 8.1*  HCT 27.2* 25.8* 24.6*  PLT 654* 583* 515*    Recent Labs Lab 12/29/14 1430 12/31/14 0131  NA 137 132*  K 4.3 4.3  CL 105 100*  CO2 23 22  BUN <5* 8  CREATININE 0.76 0.95  CALCIUM 9.0 9.0  PROT 7.5 8.0  BILITOT 0.6 0.6  ALKPHOS 83 86  ALT 17 14*  AST 29 31  GLUCOSE 55* 190*    Ray Houseman, MD 01/01/2015, 7:30 AM PGY-1, [place] Intern pager: [phone], text pages welcome

## 2015-01-02 DIAGNOSIS — Z89512 Acquired absence of left leg below knee: Secondary | ICD-10-CM

## 2015-01-02 LAB — GLUCOSE, CAPILLARY
GLUCOSE-CAPILLARY: 232 mg/dL — AB (ref 65–99)
GLUCOSE-CAPILLARY: 186 mg/dL — AB (ref 65–99)
Glucose-Capillary: 231 mg/dL — ABNORMAL HIGH (ref 65–99)
Glucose-Capillary: 235 mg/dL — ABNORMAL HIGH (ref 65–99)

## 2015-01-02 LAB — CBC
HCT: 22.6 % — ABNORMAL LOW (ref 39.0–52.0)
Hemoglobin: 7.4 g/dL — ABNORMAL LOW (ref 13.0–17.0)
MCH: 25.9 pg — AB (ref 26.0–34.0)
MCHC: 32.7 g/dL (ref 30.0–36.0)
MCV: 79 fL (ref 78.0–100.0)
PLATELETS: 510 10*3/uL — AB (ref 150–400)
RBC: 2.86 MIL/uL — ABNORMAL LOW (ref 4.22–5.81)
RDW: 15.4 % (ref 11.5–15.5)
WBC: 9.7 10*3/uL (ref 4.0–10.5)

## 2015-01-02 LAB — BASIC METABOLIC PANEL
Anion gap: 9 (ref 5–15)
BUN: 10 mg/dL (ref 6–20)
CALCIUM: 8.7 mg/dL — AB (ref 8.9–10.3)
CO2: 22 mmol/L (ref 22–32)
Chloride: 102 mmol/L (ref 101–111)
Creatinine, Ser: 1.2 mg/dL (ref 0.61–1.24)
GFR calc Af Amer: >60 mL/min (ref >60)
GLUCOSE: 213 mg/dL — AB (ref 65–99)
Potassium: 4.1 mmol/L (ref 3.5–5.1)
Sodium: 133 mmol/L — ABNORMAL LOW (ref 135–145)

## 2015-01-02 MED ORDER — GLUCERNA SHAKE PO LIQD
237.0000 mL | Freq: Three times a day (TID) | ORAL | Status: DC
  Administered 2015-01-02 – 2015-01-03 (×3): 237 mL via ORAL

## 2015-01-02 MED ORDER — INSULIN GLARGINE 100 UNIT/ML ~~LOC~~ SOLN
5.0000 [IU] | Freq: Every day | SUBCUTANEOUS | Status: DC
  Filled 2015-01-02: qty 0.05

## 2015-01-02 MED ORDER — INSULIN GLARGINE 100 UNIT/ML ~~LOC~~ SOLN
10.0000 [IU] | Freq: Every day | SUBCUTANEOUS | Status: DC
  Administered 2015-01-02 – 2015-01-03 (×2): 10 [IU] via SUBCUTANEOUS
  Filled 2015-01-02 (×3): qty 0.1

## 2015-01-02 NOTE — Progress Notes (Signed)
Rehab admissions - Evaluated for possible admission.  I met with patient and his wife.  He would like to come to inpatient rehab.  However, he has no insurance and he is doing reasonably well requiring minguard assist to ambulate 20 ft.  Please see Dr. Naaman Plummer consult recommending [facility] therapies for follow up.  Patient and wife are upset about going directly home since they had little to no follow up previously and patient's wife says the result then was infection followed by amputation.  I spoke with social worker on unit who will look into discharge options.  Call me for questions.  #[phone]

## 2015-01-02 NOTE — Care Management Note (Signed)
Case Management Note  Patient Details  Name: Ray Hall MRN: [id number] Date of Birth: 1960/08/06  Subjective/Objective:                    Action/Plan:  Patient uninsured .   Confirmed face sheet information with patient and wife at bedside. Referral given to [place] with [place] . For uninsured patient's they follow Medicaid guidelines , therefore patient cannot receive home health PT/OT , [facility] can provide home health RN and SW .  Dr Brett Albino aware and will order [facility] SW. Will have to clarify with Dr Sharol Given regarding dressing change .  Expected Discharge Date:                  Expected Discharge Plan:  [place]  In-House Referral:     Discharge planning Services  CM Consult  Post Acute Care Choice:    Choice offered to:  Patient  DME Arranged:  Walker rolling, Wheelchair manual DME Agency:  [place]:  RN [place] Agency:  [place]  Status of Service:  In process, will continue to follow  Medicare Important Message Given:    Date Medicare IM Given:    Medicare IM give by:    Date Additional Medicare IM Given:    Additional Medicare Important Message give by:     If discussed at [place] of Stay Meetings, dates discussed:    Additional Comments:  Marilu Favre, RN 01/02/2015, 3:21 PM

## 2015-01-02 NOTE — Progress Notes (Signed)
Physical Therapy Treatment Patient Details Name: Ray Hall MRN: [id number] DOB: 12/04/1960 Today's Date: 01/02/2015    History of Present Illness Pt is a 54 y/o M s/p Lt BKA.  Pt's PMH includes DM and HTN.     PT Comments    Ray Hall ambulated 20 ft in room w/ PT, limited by fatigue in Rt LE.  Pt became very anxious once Rt LE began to fatigue and required relaxation techniques once sitting in chair at end of session.  Pt remains motivated to become as independent as possible and remains appropriate for CIR.   Follow Up Recommendations  CIR     Equipment Recommendations  Rolling walker with 5" wheels (WC?)    Recommendations for Other Services OT consult;Rehab consult     Precautions / Restrictions Precautions Precautions: Fall Precaution Comments: Reviewed importance of maintaining Lt knee extension Restrictions Weight Bearing Restrictions: Yes LLE Weight Bearing: Non weight bearing    Mobility  Bed Mobility               General bed mobility comments: Pt standing at doorway w/ wife upon PT arrival  Transfers Overall transfer level: Needs assistance Equipment used: Rolling walker (2 wheeled) Transfers: Sit to/from Stand Sit to Stand: Min guard;+2 safety/equipment         General transfer comment: Max verbal cues for technique to reach back to armrests and for slow controlled descent. +2 assist for pt's safety.  Ambulation/Gait Ambulation/Gait assistance: Min guard;+2 safety/equipment Ambulation Distance (Feet): 20 Feet Assistive device: Rolling walker (2 wheeled) Gait Pattern/deviations: Antalgic (hop on Rt LE)   Gait velocity interpretation: Below normal speed for age/gender General Gait Details: Pt had already ambulated to door w/ wife prior to session and expressed desire to go to the bathroom.  Min guard to ambulate to bathroom.  Pt fatigued quickly standing while attempting to go to the bathroom and said his Rt LE was feeling weak and he needed  to sit down immediately.  Provided min guard as pt ambulated out of bathrrom to sit in chair.  Pt very anxious.   Stairs            Wheelchair Mobility    Modified Rankin (Stroke Patients Only)       Balance Overall balance assessment: Needs assistance Sitting-balance support: No upper extremity supported;Feet supported Sitting balance-Leahy Scale: Good     Standing balance support: Bilateral upper extremity supported;During functional activity Standing balance-Leahy Scale: Poor Standing balance comment: Becomes unsteady 2/2 anxiety w/ Rt LE fatigue                    Cognition Arousal/Alertness: Awake/alert Behavior During Therapy: WFL for tasks assessed/performed;Anxious Overall Cognitive Status: Within Functional Limits for tasks assessed                      Exercises Amputee Exercises Quad Sets: Strengthening;Both;10 reps;Seated Gluteal Sets: Strengthening;Both;10 reps;Seated    General Comments General comments (skin integrity, edema, etc.): Instructed pt to sit for now when using the restroom for safety and to always have nurse tech w/ him when OOB or chair, pt verbalized understanding.  Pt became very anxious once Rt LE began to fatigue and relaxation techniques were used.      Pertinent Vitals/Pain Pain Assessment: 0-10 Pain Score: 9  Faces Pain Scale: Hurts little more Pain Location: Lt LE Pain Descriptors / Indicators: Throbbing Pain Intervention(s): Limited activity within patient's tolerance;Monitored during session;Repositioned    Home  Living                      Prior Function            PT Goals (current goals can now be found in the care plan section) Acute Rehab PT Goals Patient Stated Goal: to go to rehab then home PT Goal Formulation: With patient/family Time For Goal Achievement: 01/15/15 Potential to Achieve Goals: Good Progress towards PT goals: Progressing toward goals    Frequency  Min 5X/week    PT  Plan Current plan remains appropriate    Co-evaluation             End of Session   Activity Tolerance: Patient limited by fatigue Patient left: in chair;with call bell/phone within reach;with family/visitor present     Time: 1040-1058 PT Time Calculation (min) (ACUTE ONLY): 18 min  Charges:  $Gait Training: 8-22 mins                    G Codes:      Joslyn Hy PT, [place] [phone] Pager: [phone] 01/02/2015, 12:04 PM

## 2015-01-02 NOTE — Progress Notes (Signed)
Pt wife approached CSW on unit to ask about financial resources- CSW left message for financial counselor to follow up with pt and pt family concerning payment for hospital bill.  CSW informed that pt would not be accepted to CIR rehab-discussed pt case with supervisor- pt is not appropriate for SNF placement at this time.  CSW informed RNCM.  CSW signing off at this time  Domenica Reamer, [place] Social Worker [phone]

## 2015-01-02 NOTE — Consult Note (Signed)
Physical Medicine and Rehabilitation Consult Reason for Consult: Left transtibial amputation Referring Physician: [facility]   HPI: Ray Hall is a 54 y.o. right handed male with history of diabetes mellitus peripheral neuropathy, hypertension, atrial flutter, sickle cell trait. Patient lives with spouse independently with occasional use of single-point cane prior to admission. Admitted 12/29/2014 with gangrenous changes of the left midfoot and forefoot. MRI of the foot showed marked worsening in the appearance of the foot with new soft tissue gas. New marrow edema about the second third and fourth MTP joints consistent with osteomyelitis. 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 Sharol Given. Hospital course pain management. Acute blood loss anemia 7.4. Physical therapy evaluation completed 01/01/2015 with recommendations of physical medicine rehabilitation consult.   Review of Systems  Constitutional: Negative for chills.  HENT: Negative for hearing loss.   Eyes: Negative for blurred vision and double vision.  Respiratory: Negative for cough and shortness of breath.   Cardiovascular: Positive for palpitations. Negative for chest pain.  Gastrointestinal: Positive for nausea and constipation. Negative for abdominal pain.  Genitourinary: Negative for dysuria and hematuria.  Musculoskeletal: Positive for myalgias and joint pain.  Skin: Negative for rash.  Neurological: Positive for headaches. Negative for dizziness, tingling and loss of consciousness.   Past Medical History  Diagnosis Date  . Sickle cell trait   . Normal nuclear stress test 07/2010    low prob of ischemia, EF 42%  . Echocardiogram abnormal     moderate LVH, mild LV hypokenesis, EF 42%  . History of Doppler ultrasound 07/2010    negative for DVT  . Abnormal CT of the abdomen     mild L hydronephrosis and hydroureter  . Status post radiofrequency ablation for arrhythmia 10/16/10    [facility] Rucks MD)    . Diabetes mellitus   . Hypertension   . Osteomyelitis 12/2014    LEFT FOOT   Past Surgical History  Procedure Laterality Date  . Radiofrequency ablation  10/16/10    Cardiac for atrial arrythmia, [facility]  . Dental extractions  10/15/10    prior to ablation  . Patella fracture surgery      metal rod left leg  . Tonsillectomy    . Rotator cuff repair Left 10/2014   Family History  Problem Relation Age of Onset  . Sickle cell trait    . Heart failure Father   . Diabetes Father   . Diabetes Mother   . Hypertension Mother    Social History:  reports that he quit smoking about 2 weeks ago. His smoking use included Cigars. He has never used smokeless tobacco. He reports that he does not drink alcohol or use illicit drugs. Allergies:  Allergies  Allergen Reactions  . Lactose Intolerance (Gi) Other (See Comments)    Gas & heartburn   Medications Prior to Admission  Medication Sig Dispense Refill  . Aspirin-Caffeine (BC FAST PAIN RELIEF ARTHRITIS) 1000-65 MG PACK Take 1 Package by mouth daily as needed (for pain).    . cyclobenzaprine (FLEXERIL) 10 MG tablet TAKE ONE TABLET BY MOUTH THREE TIMES DAILY AS NEEDED FOR MUSCLE SPASM 30 tablet 0  . diphenhydramine-acetaminophen (TYLENOL PM) 25-500 MG TABS Take 2 tablets by mouth at bedtime.    . gabapentin (NEURONTIN) 100 MG capsule Take 1 capsule (100 mg total) by mouth 3 (three) times daily. 270 capsule 2  . glipiZIDE (GLUCOTROL) 10 MG tablet TAKE ONE TABLET BY MOUTH TWICE DAILY BEFORE MEAL(S) 180 tablet 3  .  lisinopril (PRINIVIL,ZESTRIL) 20 MG tablet Take 1 tablet (20 mg total) by mouth daily. 30 tablet 2  . metFORMIN (GLUCOPHAGE) 1000 MG tablet Take 1 tablet (1,000 mg total) by mouth 2 (two) times daily with a meal. 180 tablet 3  . ranitidine (ZANTAC) 150 MG tablet Take 1 tablet (150 mg total) by mouth 2 (two) times daily. 60 tablet 2  . SUMAtriptan (IMITREX) 50 MG tablet Take 1 tablet (50 mg total) by mouth every 2 (two) hours as needed for  migraine or headache. May repeat in 2 hours if headache persists or recurs. Do not exceed 3 doses in 24 hours. 9 tablet 0  . vitamin B-12 1000 MCG tablet Take 1 tablet (1,000 mcg total) by mouth daily. 30 tablet 0  . [DISCONTINUED] atorvastatin (LIPITOR) 40 MG tablet Take 1 tablet (40 mg total) by mouth daily at 6 PM. (Patient not taking: Reported on 12/29/2014) 30 tablet 0    Home: Home Living Family/patient expects to be discharged to:: Inpatient rehab Living Arrangements: Spouse/significant other Available Help at Discharge: Family Type of Home: House Home Access: Stairs to enter Technical brewer of Steps: 1 Entrance Stairs-Rails: None Home Layout: One level Home Equipment: [place] - single point  Functional History: Prior Function Level of Independence: Needs assistance Gait / Transfers Assistance Needed: Ind PTA ADL's / Homemaking Assistance Needed: needed assist w/ donning/doffing shoes on Lt LE Functional Status:  Mobility: Bed Mobility General bed mobility comments: Pt standing EOB w/ nurse in room upon PT arrival Transfers Overall transfer level: Needs assistance Equipment used: Rolling walker (2 wheeled) Transfers: Sit to/from Stand, [employer] Transfers Sit to Stand: Min assist, From elevated surface Stand pivot transfers: Min assist General transfer comment: Min assist to stabilize RW.  Pt performs sit<>stand x4 from bed at lower heigh each try.  Min assist during stand pivot to stabilize walker and to provide pt directional cues.  Pillows placed on recliner chair to elevate seat.      ADL:    Cognition: Cognition Overall Cognitive Status: Within Functional Limits for tasks assessed Orientation Level: Oriented X4 Cognition Arousal/Alertness: Awake/alert Behavior During Therapy: WFL for tasks assessed/performed, Anxious Overall Cognitive Status: Within Functional Limits for tasks assessed  Blood pressure 128/63, pulse 86, temperature 98.8 F (37.1 C),  temperature source Oral, resp. rate 18, height 6\' 3"  (1.905 m), weight 143.3 kg (315 lb 14.7 oz), SpO2 100 %. Physical Exam  Constitutional: He is oriented to person, place, and time.  54 year old African-American obese male  HENT:  Head: Normocephalic.  Poor dentition  Eyes: EOM are normal.  Neck: Normal range of motion. Neck supple. No thyromegaly present.  Cardiovascular:  Cardiac rate controlled  Respiratory: Effort normal and breath sounds normal. No respiratory distress.  GI: Soft. Bowel sounds are normal. He exhibits no distension.  Musculoskeletal:  Left BK in coban wrap--appropriate shape and appearance  Neurological: He is alert and oriented to person, place, and time.  UE 5/5. RLE 5/5  Skin:  BKA site is dressed and appropriately tender  Psychiatric: He has a normal mood and affect. His behavior is normal.    Results for orders placed or performed during the hospital encounter of 12/29/14 (from the past 24 hour(s))  Glucose, capillary     Status: Abnormal   Collection Time: 01/01/15 12:40 PM  Result Value Ref Range   Glucose-Capillary 218 (H) 65 - 99 mg/dL  Glucose, capillary     Status: Abnormal   Collection Time: 01/01/15  5:09 PM  Result Value Ref Range   Glucose-Capillary 229 (H) 65 - 99 mg/dL  Glucose, capillary     Status: Abnormal   Collection Time: 01/01/15  9:43 PM  Result Value Ref Range   Glucose-Capillary 323 (H) 65 - 99 mg/dL  CBC     Status: Abnormal   Collection Time: 01/02/15  3:57 AM  Result Value Ref Range   WBC 9.7 4.0 - 10.5 K/uL   RBC 2.86 (L) 4.22 - 5.81 MIL/uL   Hemoglobin 7.4 (L) 13.0 - 17.0 g/dL   HCT 22.6 (L) 39.0 - 52.0 %   MCV 79.0 78.0 - 100.0 fL   MCH 25.9 (L) 26.0 - 34.0 pg   MCHC 32.7 30.0 - 36.0 g/dL   RDW 15.4 11.5 - 15.5 %   Platelets 510 (H) 150 - 400 K/uL  Basic metabolic panel     Status: Abnormal   Collection Time: 01/02/15  3:57 AM  Result Value Ref Range   Sodium 133 (L) 135 - 145 mmol/L   Potassium 4.1 3.5 - 5.1  mmol/L   Chloride 102 101 - 111 mmol/L   CO2 22 22 - 32 mmol/L   Glucose, Bld 213 (H) 65 - 99 mg/dL   BUN 10 6 - 20 mg/dL   Creatinine, Ser 1.20 0.61 - 1.24 mg/dL   Calcium 8.7 (L) 8.9 - 10.3 mg/dL   GFR calc non Af Amer >60 >60 mL/min   GFR calc Af Amer >60 >60 mL/min   Anion gap 9 5 - 15  Glucose, capillary     Status: Abnormal   Collection Time: 01/02/15  7:55 AM  Result Value Ref Range   Glucose-Capillary 232 (H) 65 - 99 mg/dL   Comment 1 Notify RN    No results found.  Assessment/Plan: Diagnosis: left BKA 1. Does the need for close, 24 hr/day medical supervision in concert with the patient's rehab needs make it unreasonable for this patient to be served in a less intensive setting? No 2. Co-Morbidities requiring supervision/potential complications: dm 2, aflutter 3. Due to bladder management, safety, skin/wound care, medication administration and pain management, does the patient require 24 hr/day rehab nursing? No 4. Does the patient require coordinated care of a physician, rehab nurse, PT (1-2 hrs/day, 5 days/week) and OT (1-2 hrs/day, 5 days/week) to address physical and functional deficits in the context of the above medical diagnosis(es)? No Addressing deficits in the following areas: balance, locomotion, transferring, dressing and grooming 5. Can the patient actively participate in an intensive therapy program of at least 3 hrs of therapy per day at least 5 days per week? Potentially 6. The potential for patient to make measurable gains while on inpatient rehab is fair 7. Anticipated functional outcomes upon discharge from inpatient rehab are n/a  with PT, n/a with OT, n/a with SLP. 8. Estimated rehab length of stay to reach the above functional goals is: n/a 9. Does the patient have adequate social supports and living environment to accommodate these discharge functional goals? Yes 10. Anticipated D/C setting: Home 11. Anticipated post D/C treatments: [place]  therapy 12. Overall Rehab/Functional Prognosis: excellent  RECOMMENDATIONS: This patient's condition is appropriate for continued rehabilitative care in the following setting: [facility] Therapy Patient has agreed to participate in recommended program. Yes Note that insurance prior authorization may be required for reimbursement for recommended care.  Comment: Pt has one story home, only a small step to enter, ample assistance, and prefers to go home.   Meredith Staggers, MD, Mell[facility] Drown  [place] 01/02/2015     01/02/2015

## 2015-01-02 NOTE — Progress Notes (Signed)
Patient ID: Ray Hall, male   DOB: [birth year], 54 y.o.   MRN: [id number] Patient is status post day 2 transtibial amputation on the left. Patient states that he does not have insurance. Anticipate discharge to home with home health therapy.

## 2015-01-02 NOTE — Progress Notes (Signed)
Patient was screened by Gerlean Ren for appropriateness for an Inpatient Acute Rehab consult.  At this time, we are recommending Inpatient Rehab consult.  Please order if/when you feel appropriate. I have text paged Family Medicine to update them of the recommendation .    [facility] Admissions Coordinator Cell [phone] Office [phone]

## 2015-01-02 NOTE — Discharge Summary (Signed)
[facility] Summary  Patient name: Ray Hall Medical record number: [id number] Date of birth: [birth year] Age: 54 y.o. Gender: male Date of Admission: 12/29/2014  Date of Discharge: 01/03/2015  Admitting Physician: Kinnie Feil, MD  Primary Care Provider: Tawanna Sat, MD Consultants:  Vascular surgery Orthopedic Surgery  Indication for Hospitalization: Wet grangrene of diabetic foot ulcer  Discharge Diagnoses/Problem List:  Patient Active Problem List   Diagnosis Date Noted  . Type 2 diabetes mellitus with diabetic peripheral angiopathy with gangrene   . Absent pedal pulses   . Hardware complicating wound infection   . Gangrene associated with diabetes mellitus 12/29/2014  . Acid reflux 12/23/2014  . AKI (acute kidney injury) 12/23/2014  . Anemia 12/23/2014  . Headache 12/23/2014  . Chest pain 12/17/2014  . History of atrial fibrillation 12/17/2014  . Elevated troponin   . Diabetic foot ulcer 12/16/2014  . Sepsis 12/16/2014  . SIRS (systemic inflammatory response syndrome) 12/16/2014  . Atrial flutter 12/16/2014  . Pain of left upper extremity 02/08/2014  . Erectile dysfunction 11/06/2012  . BENIGN PROSTATIC HYPERTROPHY, WITH OBSTRUCTION 05/25/2010  . LOW BACK PAIN SYNDROME, SEVERE 04/16/2010  . History of recurrent UTIs 12/09/2008  . Diabetes mellitus, type II 06/26/2008  . Essential hypertension 06/24/2008     Disposition: Home with home health  Discharge Condition: Stable  Discharge Exam:   Temp: [98 F (36.7 C)-99 F (37.2 C)] 99 F (37.2 C) (09/06 0526) Pulse Rate: [86-102] 102 (09/05 2216) Resp: [17-18] 17 (09/06 0526) BP: (128-154)/(63-87) 141/84 mmHg (09/06 0526) SpO2: [100 %] 100 % (09/06 0526) Physical Exam: General: NAD, lying in bed, eating breakfast comfortably Cardiovascular: RRR Respiratory: CTAB Abdomen:soft, non tender, + BS Extremities: left LE wound dressing c/d/i, able to flex  and extend left knee, right LE: no edema   Brief Hospital Course:  Ray Hall is a 54 y.o. male with recent hospitalization on 12/16/2014 for concern of osteomyelitis (which was ruled out at that time with negative MRI and was treated for cellulitis with doxycycline) and was admitted from clinic 9/1 for management of wet gangrene from diabetic foot ulcer. PMH is significant for type 2 DM, HTN, BPH, diabetic foot ulcer, GERD, and atrial flutter.  On admission he was noted to have a left wet grangrenous ulcer. MRI of his left foot confirmed osteomyelitis with overlying gangrene. He was started on Vacomycin and Zosyn and Orthopedics as well as Vascular surgery were consulted. He significantly had a left metal plate placed in his left leg after a remote accident. This necessitated both surgical services input regarding his surgery. He underwent an uncomplicated left transtibial amputation with a stable post operative course. He was noted to remained hyperglycemic despite initially being managed with SSI and was transitioned to Lantus 10 units with  improved glucose control, but still sub-optimal. On day of discharge he was given an additional 3 units in the AM based on his short acting insulin requirements. Additionally his creatinine was noted to trend up to 1.2 post operatively, so his home lisinopril was held. This rise was thought to be secondary to potential dehydration with surgical insult. He was encouraged to maintain good oral hydration and his Cr trended to normal at .95.  His blood pressure was well managed with IV hydralazine while holding his lisinopril. When his Cr trended down he was restarted on his home lisinopril with improvement in his blood pressures  Issues for Follow Up:  1. Diabetic regimen for improved  glucose control 2. Follow up home health regimen   Significant Procedures:  Left transtibial amputation  Significant Labs and Imaging:   Recent Labs Lab 01/01/15 0700  01/02/15 0357 01/03/15 0524  WBC 11.3* 9.7 9.2  HGB 8.1* 7.4* 7.5*  HCT 24.6* 22.6* 22.4*  PLT 515* 510* 460*    Recent Labs Lab 12/29/14 1430 12/31/14 0131 01/01/15 0700 01/02/15 0357 01/03/15 0524  NA 137 132* 133* 133* 135  K 4.3 4.3 4.2 4.1 4.0  CL 105 100* 101 102 101  CO2 23 22 24 22 25   GLUCOSE 55* 190* 178* 213* 188*  BUN <5* 8 8 10 9   CREATININE 0.76 0.95 1.15 1.20 0.94  CALCIUM 9.0 9.0 8.5* 8.7* 9.0  ALKPHOS 83 86  --   --   --   AST 29 31  --   --   --   ALT 17 14*  --   --   --   ALBUMIN 2.9* 3.0*  --   --   --     Results/Tests Pending at Time of Discharge:  None  Discharge Medications:    Medication List    TAKE these medications        aspirin EC 81 MG tablet  Take 1 tablet (81 mg total) by mouth daily.     atorvastatin 40 MG tablet  Commonly known as:  LIPITOR  Take 1 tablet (40 mg total) by mouth daily at 6 PM.     BC FAST PAIN RELIEF ARTHRITIS 1000-65 MG Pack  Generic drug:  Aspirin-Caffeine  Take 1 Package by mouth daily as needed (for pain).     cyanocobalamin 1000 MCG tablet  Take 1 tablet (1,000 mcg total) by mouth daily.     cyclobenzaprine 10 MG tablet  Commonly known as:  FLEXERIL  TAKE ONE TABLET BY MOUTH THREE TIMES DAILY AS NEEDED FOR MUSCLE SPASM     diphenhydramine-acetaminophen 25-500 MG Tabs  Commonly known as:  TYLENOL PM  Take 2 tablets by mouth at bedtime.     gabapentin 100 MG capsule  Commonly known as:  NEURONTIN  Take 1 capsule (100 mg total) by mouth 3 (three) times daily.     glipiZIDE 10 MG tablet  Commonly known as:  GLUCOTROL  TAKE ONE TABLET BY MOUTH TWICE DAILY BEFORE MEAL(S)     lisinopril 20 MG tablet  Commonly known as:  PRINIVIL,ZESTRIL  Take 1 tablet (20 mg total) by mouth daily.     metFORMIN 1000 MG tablet  Commonly known as:  GLUCOPHAGE  Take 1 tablet (1,000 mg total) by mouth 2 (two) times daily with a meal.     oxyCODONE 5 MG immediate release tablet  Commonly known as:  Oxy  IR/ROXICODONE  Take 1-2 tablets (5-10 mg total) by mouth every 3 (three) hours as needed for breakthrough pain.     ranitidine 150 MG tablet  Commonly known as:  ZANTAC  Take 1 tablet (150 mg total) by mouth 2 (two) times daily.     SUMAtriptan 50 MG tablet  Commonly known as:  IMITREX  Take 1 tablet (50 mg total) by mouth every 2 (two) hours as needed for migraine or headache. May repeat in 2 hours if headache persists or recurs. Do not exceed 3 doses in 24 hours.        Discharge Instructions: Please refer to Patient Instructions section of EMR for full details.  Patient was counseled important signs and symptoms that should prompt return to  medical care, changes in medications, dietary instructions, activity restrictions, and follow up appointments.   Follow-Up Appointments: Follow-up Information    Follow up with DUDA,MARCUS V, MD In 2 weeks.   Specialty:  Orthopedic Surgery   Contact information:   [place] [address] [phone]       Follow up with Tawanna Sat, MD On 01/12/2015.   Specialty:  Family Medicine   Why:  1:45   Contact information:   [place] [address] [phone]       Veatrice Bourbon, MD 01/03/2015, 12:16 PM PGY-2, [place]

## 2015-01-02 NOTE — Progress Notes (Signed)
Family Medicine Teaching Service Daily Progress Note Intern Pager: [phone]  Patient name: Ray Hall Medical record number: [id number] Date of birth: July 08, 1960 Age: 54 y.o. Gender: male  Primary Care Provider: Tawanna Sat, MD Consultants: Vascular Surgery, Orthopedics Code Status: FULL  Pt Overview and Major Events to Date:  9/1: Admitted from [facility] with gas gangrene, Vanc/zosyn started 9/2: Vascular surgery and orthopedics evaluate 9/3: Transtibial amputation  9/4: D/c antibiotics   Assessment and Plan: Ray Hall is a 54 y.o. male with recent hospitalization on 12/16/2014 for concern of osteomyelitis (which was ruled out at that time and was treated for cellulitis with doxycycline) admitted from clinic 9/1 for management of wet gangrene from diabetic foot ulcer. 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  PMH is significant for type 2 DM, HTN, BPH, diabetic foot ulcer, GERD, and atrial flutter.   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 - will discontinue antibiotics and monitor - POD2 s/p transtibial amputation and tibial plate hardware removal 9/3- f/u post op DVT ppx recs - WOC - PT consulted - Morphine 1mg  IV q2h prn - blood cultures NGTD  - Daily CBC  Post Op fever to 100.7- No s/s of infection no WBC -Continue to trend WBC  HTN: Elevated, holding lisinopril given AKI  - Hydralazine 10mg  IV q2h prn to maintain SBP < 150 and DBP < 100  Hyponatremia: Na 133--> corrected Na 135 - Continue to trend BMP  AKI: Cr 1.2<-- 1.5, potentially secondary to increased losses from OR, has maintained adequate UOP  -  Follow BMP - encourage good PO  DM2: Last A1c 7.4 8/19, on glipizide, metformin at home. Persistent elevated blood glucose - increase lantus to 10 units 9/5 - SSI - CBG ACHS  Adjustment reaction: Progressing to acceptance slowly.  -  Continue pastoral care as desired  History of atrial flutter 2012 s/p ablation in 2012 at [facility], currently in [facility],  - monitor closely - no need for telemetry at this time  GERD:  -PPI started  FEN/GI: KVO; Ensure Prophylaxis: SCDs  Dispo:  - f/u ortho reccs  - will monitor off antibiotics and wait for PT/OT eval.   Subjective:  Denies SOB. Chest pain, able to tolerate PO, has yet to have BM yes but chronically constipated  Objective: Temp:  [98.8 F (37.1 C)-100.7 F (38.2 C)] 98.8 F (37.1 C) (09/05 0428) Pulse Rate:  [99] 99 (09/05 0428) Resp:  [17-18] 18 (09/05 0428) BP: (143-161)/(82-87) 161/83 mmHg (09/05 0428) SpO2:  [100 %] 100 % (09/05 0428) Weight:  [315 lb 14.7 oz (143.3 kg)] 315 lb 14.7 oz (143.3 kg) (09/04 1540) Physical Exam: General: NAD, lying in bed  Cardiovascular: RRR Respiratory: CTAB Abdomen:soft, non tender, + BS Extremities: left LE wound dressing c/d/i, able to flex and extend left knee, right LE: no edema   Laboratory:  Recent Labs Lab 12/31/14 0308 01/01/15 0700 01/02/15 0357  WBC 11.1* 11.3* 9.7  HGB 8.6* 8.1* 7.4*  HCT 25.8* 24.6* 22.6*  PLT 583* 515* 510*    Recent Labs Lab 12/29/14 1430 12/31/14 0131 01/01/15 0700 01/02/15 0357  NA 137 132* 133* 133*  K 4.3 4.3 4.2 4.1  CL 105 100* 101 102  CO2 23 22 24 22   BUN <5* 8 8 10   CREATININE 0.76 0.95 1.15 1.20  CALCIUM 9.0 9.0 8.5* 8.7*  PROT 7.5 8.0  --   --  BILITOT 0.6 0.6  --   --   ALKPHOS 83 86  --   --   ALT 17 14*  --   --   AST 29 31  --   --   GLUCOSE 55* 190* 178* 213*    Ray Bourbon, MD 01/02/2015, 8:32 AM PGY-2, [place] Intern pager: [phone], text pages welcome

## 2015-01-03 ENCOUNTER — Encounter (HOSPITAL_COMMUNITY): Payer: Self-pay | Admitting: Orthopedic Surgery

## 2015-01-03 LAB — CULTURE, BLOOD (ROUTINE X 2)
CULTURE: NO GROWTH
Culture: NO GROWTH

## 2015-01-03 LAB — GLUCOSE, CAPILLARY
GLUCOSE-CAPILLARY: 191 mg/dL — AB (ref 65–99)
GLUCOSE-CAPILLARY: 243 mg/dL — AB (ref 65–99)

## 2015-01-03 LAB — BASIC METABOLIC PANEL
ANION GAP: 9 (ref 5–15)
BUN: 9 mg/dL (ref 6–20)
CALCIUM: 9 mg/dL (ref 8.9–10.3)
CO2: 25 mmol/L (ref 22–32)
Chloride: 101 mmol/L (ref 101–111)
Creatinine, Ser: 0.94 mg/dL (ref 0.61–1.24)
Glucose, Bld: 188 mg/dL — ABNORMAL HIGH (ref 65–99)
Potassium: 4 mmol/L (ref 3.5–5.1)
SODIUM: 135 mmol/L (ref 135–145)

## 2015-01-03 LAB — CBC
HCT: 22.4 % — ABNORMAL LOW (ref 39.0–52.0)
Hemoglobin: 7.5 g/dL — ABNORMAL LOW (ref 13.0–17.0)
MCH: 26.3 pg (ref 26.0–34.0)
MCHC: 33.5 g/dL (ref 30.0–36.0)
MCV: 78.6 fL (ref 78.0–100.0)
PLATELETS: 460 10*3/uL — AB (ref 150–400)
RBC: 2.85 MIL/uL — ABNORMAL LOW (ref 4.22–5.81)
RDW: 15.4 % (ref 11.5–15.5)
WBC: 9.2 10*3/uL (ref 4.0–10.5)

## 2015-01-03 MED ORDER — LISINOPRIL 20 MG PO TABS
20.0000 mg | ORAL_TABLET | Freq: Every day | ORAL | Status: DC
Start: 2015-01-03 — End: 2015-01-03
  Administered 2015-01-03: 20 mg via ORAL
  Filled 2015-01-03: qty 1

## 2015-01-03 MED ORDER — INSULIN GLARGINE 100 UNIT/ML ~~LOC~~ SOLN
3.0000 [IU] | Freq: Once | SUBCUTANEOUS | Status: AC
  Administered 2015-01-03: 3 [IU] via SUBCUTANEOUS
  Filled 2015-01-03 (×2): qty 0.03

## 2015-01-03 MED ORDER — OXYCODONE HCL 5 MG PO TABS: 5.0000 mg | ORAL_TABLET | ORAL | Status: DC | PRN

## 2015-01-03 MED ORDER — ASPIRIN EC 81 MG PO TBEC: 81.0000 mg | DELAYED_RELEASE_TABLET | Freq: Every day | ORAL | Status: DC

## 2015-01-03 MED ORDER — HYDROMORPHONE HCL 2 MG PO TABS: 1.0000 mg | ORAL_TABLET | ORAL | Status: DC | PRN

## 2015-01-03 MED ORDER — ATORVASTATIN CALCIUM 40 MG PO TABS: 40.0000 mg | ORAL_TABLET | Freq: Every day | ORAL | Status: DC

## 2015-01-03 NOTE — Progress Notes (Signed)
Nutrition Follow-up  DOCUMENTATION CODES:   Obesity unspecified  INTERVENTION:   -Continue Glucerna Shake po TID, each supplement provides 220 kcal and 10 grams of protein  NUTRITION DIAGNOSIS:   Increased nutrient needs related to wound healing as evidenced by estimated needs.  Ongoing  GOAL:   Patient will meet greater than or equal to 90% of their needs  Progressing  MONITOR:   PO intake, Supplement acceptance, Diet advancement, Labs, Weight trends, Skin, I & O's  REASON FOR ASSESSMENT:   Malnutrition Screening Tool    ASSESSMENT:   Ray Hall is a 54 y.o. male presenting with possible wet gangrene. PMH is significant for type 2 DM, HTN, BPH, diabetic foot ulcer, GERD, and atrial flutter.   S/p Procedure(s) on 12/31/14: [place] (Left)  Pt working with therapy at time of visit.   Pt has been advanced to a Carb Modified diet. Intake is fair to good (PO: 50-100%). Noted RD added Glucerna shake on 01/02/15. RD will continue order.   CSW following. Discharge disposition is likely home with home Health (RN and CSW). Staff is awaiting clarification for orthopedics regarding dressing changes.   Labs reviewed.   Diet Order:  Diet Carb Modified Fluid consistency:: Thin; Room service appropriate?: Yes  Skin:  Wound (see comment) (closed lt leg incision)  Last BM:  12/31/14  Height:   Ht Readings from Last 1 Encounters:  01/01/15 6\' 3"  (1.905 m)    Weight:   Wt Readings from Last 1 Encounters:  01/01/15 315 lb 14.7 oz (143.3 kg)    Ideal Body Weight:  83.2 kg (adjusted for ampuations)  BMI:  Body mass index is 39.49 kg/(m^2).  Adjusted BMI: (42.2)  Estimated Nutritional Needs:   Kcal:  2200-2400  Protein:  110-125 grams  Fluid:  >2.2 L  EDUCATION NEEDS:   Education needs addressed  Tuwanda Vokes A. Jimmye Norman, RD, LDN, CDE Pager: [phone] After hours Pager: [phone]

## 2015-01-03 NOTE — Discharge Instructions (Signed)
You were admitted for an amputation of left foot gangrene from your diabetes. If you have fever, chest pain or shortness of breath call EMS and go to the emergency department right away Please follow up with your Physicians as below:  Follow-up Information    Follow up with DUDA,MARCUS V, MD In 2 weeks.   Specialty:  Orthopedic Surgery   Contact information:   [place] [address] [phone]       Follow up with Tawanna Sat, MD On 01/12/2015.   Specialty:  Family Medicine   Why:  1:45   Contact information:   [place] [address] [phone]

## 2015-01-03 NOTE — Evaluation (Signed)
Occupational Therapy Evaluation Patient Details Name: Ray Hall MRN: [id number] DOB: Dec 15, 1960 Today's Date: 01/03/2015    History of Present Illness Pt is a 54 y/o M s/p Lt BKA.  Pt's PMH includes DM and HTN.    Clinical Impression   Pt was assisted for L LE dressing prior to admission, otherwise independent.  Pt presents with L LE pain and impaired balance interfering with ability to perform ADL and ADL transfers.  Instructed and issued AE and educated in uses of 3 in 1.  Pt will benefit from further acute OT to reinforce education and address standing balance for ADL.  Will follow.    Follow Up Recommendations  Supervision/Assistance - 24 hour    Equipment Recommendations  3 in 1 bedside comode    Recommendations for Other Services       Precautions / Restrictions Precautions Precautions: Fall Restrictions Weight Bearing Restrictions: No      Mobility Bed Mobility Overal bed mobility: Needs Assistance Bed Mobility: Supine to Sit;Sit to Supine     Supine to sit: Supervision;HOB elevated Sit to supine: Supervision;HOB elevated      Transfers                      Balance     Sitting balance-Leahy Scale: Good       Standing balance-Leahy Scale: Poor Standing balance comment: not able to release walker to stand for ADL                            ADL Overall ADL's : Needs assistance/impaired Eating/Feeding: Independent;Sitting   Grooming: Oral care;Set up;Sitting   Upper Body Bathing: Set up;Sitting   Lower Body Bathing: Moderate assistance;Sitting/lateral leans   Upper Body Dressing : Set up;Sitting   Lower Body Dressing: Moderate assistance;Sitting/lateral leans   Toilet Transfer: Min Geophysical data processor Details (indicate cue type and reason): advised pt to sit to urinate for safety Toileting- Clothing Manipulation and Hygiene: Min guard;Sit to/from stand         General ADL Comments: Issued and  instructed in use of AE for LB ADL.  Educated in multiple uses of 3 in 1. Pt thinks he may have a tub transfer bench he may be able to borrow from a family member. Advised pt to wait until MD approves showering prior to initiating.       Vision     Perception     Praxis      Pertinent Vitals/Pain Pain Assessment: 0-10 Pain Score: 7  Pain Location: L LE Pain Descriptors / Indicators: Operative site guarding;Grimacing;Sore Pain Intervention(s): Limited activity within patient's tolerance;Monitored during session;Repositioned;RN gave pain meds during session     Hand Dominance Right   Extremity/Trunk Assessment Upper Extremity Assessment Upper Extremity Assessment: Overall WFL for tasks assessed   Lower Extremity Assessment Lower Extremity Assessment: Defer to PT evaluation       Communication Communication Communication: No difficulties   Cognition Arousal/Alertness: Awake/alert Behavior During Therapy: WFL for tasks assessed/performed;Anxious Overall Cognitive Status: Within Functional Limits for tasks assessed                     General Comments       Exercises       Shoulder Instructions      Home Living Family/patient expects to be discharged to:: Private residence Living Arrangements: Spouse/significant other Available Help at Discharge: Family;Available 24 hours/day Type of Home:  House Home Access: Stairs to enter Technical brewer of Steps: 1 Entrance Stairs-Rails: None Home Layout: One level     Bathroom Shower/Tub: Teacher, early years/pre: Standard     Home Equipment: [employer] - single point          Prior Functioning/Environment Level of Independence: Needs assistance  Gait / Transfers Assistance Needed: Ind PTA ADL's / Homemaking Assistance Needed: needed assist w/ donning/doffing shoes on Lt LE        OT Diagnosis: Generalized weakness;Acute pain   OT Problem List: Decreased strength;Decreased activity  tolerance;Impaired balance (sitting and/or standing);Decreased knowledge of use of DME or AE;Obesity;Pain   OT Treatment/Interventions: Self-care/ADL training;DME and/or AE instruction;Therapeutic activities;Patient/family education;Balance training    OT Goals(Current goals can be found in the care plan section) Acute Rehab OT Goals Patient Stated Goal: be as independent at possible OT Goal Formulation: With patient Potential to Achieve Goals: Good ADL Goals Pt Will Perform Grooming: with min guard assist;standing Pt Will Perform Lower Body Bathing: with min guard assist;with adaptive equipment;sitting/lateral leans;sit to/from stand Pt Will Perform Lower Body Dressing: with min guard assist;with adaptive equipment;sit to/from stand;sitting/lateral leans Pt Will Transfer to Toilet: with supervision;ambulating;bedside commode (over toilet) Pt Will Perform Toileting - Clothing Manipulation and hygiene: with supervision;sit to/from stand Pt Will Perform Tub/Shower Transfer: Tub transfer;with min guard assist;ambulating;3 in 1;tub bench;rolling walker  OT Frequency: Min 2X/week   Barriers to D/C:            Co-evaluation              End of Session Nurse Communication: Patient requests pain meds  Activity Tolerance: Patient limited by fatigue;Patient limited by pain Patient left: in bed;with call bell/phone within reach;with family/visitor present   Time: 5093-2671 OT Time Calculation (min): 21 min Charges:  OT General Charges $OT Visit: 1 Procedure OT Evaluation $Initial OT Evaluation Tier I: 1 Procedure G-Codes:    Malka So 01/03/2015, 12:02 PM  [phone]

## 2015-01-03 NOTE — Progress Notes (Signed)
Physical Therapy Treatment Patient Details Name: Ray Hall MRN: [id number] DOB: 15-Jun-1960 Today's Date: 01/03/2015    History of Present Illness Pt is a 54 y/o M s/p Lt BKA.  Pt's PMH includes DM and HTN.     PT Comments    Mr. Sanfilippo made excellent progress this session and Pt demonstrated ability to ambulate 90 ft this session. Per pt's notes pt has not been approved for CIR and is not eligible for SNF or HHPT.  Pt will have good support from wife at home who has been present during all PT sessions.  CM made aware that pt will need WC prior to d/c.   Follow Up Recommendations  Home health PT (however, per CM note, pt not approved for HHPT)     Equipment Recommendations  Rolling walker with 5" wheels;Wheelchair (measurements PT);Wheelchair cushion (measurements PT)    Recommendations for Other Services       Precautions / Restrictions Precautions Precautions: Fall Precaution Comments: Reviewed importance of maintaining Lt knee extension and providing sensory feedback to distal Lt LE Restrictions Weight Bearing Restrictions: Yes LLE Weight Bearing: Non weight bearing    Mobility  Bed Mobility Overal bed mobility: Modified Independent Bed Mobility: Supine to Sit     Supine to sit: Modified independent (Device/Increase time);HOB elevated Sit to supine: Supervision;HOB elevated   General bed mobility comments: Mod I w/ HOB slightly elevated  Transfers Overall transfer level: Needs assistance Equipment used: Rolling walker (2 wheeled) Transfers: Sit to/from Stand Sit to Stand: Min guard         General transfer comment: Pt w/ good technique.  Min guard for safety.  Ambulation/Gait Ambulation/Gait assistance: Min guard;+2 safety/equipment Ambulation Distance (Feet): 90 Feet Assistive device: Rolling walker (2 wheeled) Gait Pattern/deviations: Trunk flexed (hop on Rt LE)   Gait velocity interpretation: Below normal speed for age/gender General Gait Details:  Min guard for safety.  Standing rest break x2 at which point pt reminds himself to stand upright.  Increased endurance this session.   Stairs Stairs: Yes Stairs assistance: Min assist Stair Management: No rails;Backwards;With walker Number of Stairs: 1 General stair comments: Pt has "lip" entering home, not a full step.  Pt demonstrated ability to hop up backward over gloves on floor w/ min assist from wife stabilizing RW.  Wheelchair Mobility    Modified Rankin (Stroke Patients Only)       Balance Overall balance assessment: Needs assistance Sitting-balance support: No upper extremity supported;Feet supported Sitting balance-Leahy Scale: Good     Standing balance support: Bilateral upper extremity supported;During functional activity Standing balance-Leahy Scale: Poor Standing balance comment: Requires RW for stability                    Cognition Arousal/Alertness: Awake/alert Behavior During Therapy: WFL for tasks assessed/performed;Anxious Overall Cognitive Status: Within Functional Limits for tasks assessed                      Exercises Amputee Exercises Quad Sets: Strengthening;Both;10 reps;Seated Gluteal Sets: Strengthening;Both;10 reps;Seated Knee Extension: AROM;Left;10 reps;Seated    General Comments General comments (skin integrity, edema, etc.): Per pt's notes pt has not been approved for CIR and is not eligible for SNF or HHPT.  Pt will have good support from wife at home who has been present during all PT sessions.  CM made aware that pt will need WC prior to d/c.      Pertinent Vitals/Pain Pain Assessment: 0-10 Pain Score: 7  Pain Location: Lt LE Pain Descriptors / Indicators: Constant;Throbbing Pain Intervention(s): Limited activity within patient's tolerance;Monitored during session;Repositioned    Home Living Family/patient expects to be discharged to:: Private residence Living Arrangements: Spouse/significant other Available  Help at Discharge: Family;Available 24 hours/day Type of Home: House Home Access: Stairs to enter Entrance Stairs-Rails: None Home Layout: One level Home Equipment: Cane - single point      Prior Function Level of Independence: Needs assistance  Gait / Transfers Assistance Needed: Ind PTA ADL's / Homemaking Assistance Needed: needed assist w/ donning/doffing shoes on Lt LE     PT Goals (current goals can now be found in the care plan section) Acute Rehab PT Goals Patient Stated Goal: to go to rehab then home PT Goal Formulation: With patient/family Time For Goal Achievement: 01/15/15 Potential to Achieve Goals: Good Progress towards PT goals: Progressing toward goals    Frequency  Min 5X/week    PT Plan Discharge plan needs to be updated    Co-evaluation             End of Session Equipment Utilized During Treatment: Gait belt Activity Tolerance: Patient tolerated treatment well Patient left: with call bell/phone within reach;with family/visitor present;in bed     Time: 1683-7290 PT Time Calculation (min) (ACUTE ONLY): 28 min  Charges:  $Gait Training: 23-37 mins                    G Codes:      Joslyn Hy PT, [place] [phone] Pager: [phone] 01/03/2015, 12:21 PM

## 2015-01-03 NOTE — Progress Notes (Signed)
Family Medicine Teaching Service Daily Progress Note Intern Pager: [phone]  Patient name: Ray Hall Medical record number: [id number] Date of birth: 07-Oct-1960 Age: 54 y.o. Gender: male  Primary Care Provider: Tawanna Sat, MD Consultants: Vascular Surgery, Orthopedics Code Status: FULL  Pt Overview and Major Events to Date:  9/1: Admitted from [facility] with gas gangrene, Vanc/zosyn started 9/2: Vascular surgery and orthopedics evaluate 9/3: Transtibial amputation  9/4: D/c antibiotics   Assessment and Plan: Ray Hall is a 54 y.o. male with recent hospitalization on 12/16/2014 for concern of osteomyelitis (which was ruled out at that time and was treated for cellulitis with doxycycline) admitted from clinic 9/1 for management of wet gangrene from diabetic foot ulcer. 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  PMH is significant for type 2 DM, HTN, BPH, diabetic foot ulcer, GERD, and atrial flutter.   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 - will discontinue antibiotics and monitor - POD3 s/p transtibial amputation and tibial plate hardware removal 9/3- f/u post op DVT ppx recs - WOC - PT/OT  Appreciate recs - oxycodone PO PRN - blood cultures NGTD  - Daily CBC  Post Op fever to 100.7 9/5- Afebrile for 24 hours, no s/s of infection no WBC -Continue to trend WBC  HTN: Blood pressures still elevated above goal, but AKI resolved so home lisinopril can be restarted - Resart lisinopril - Hydralazine 10mg  IV q2h prn to maintain SBP < 150 and DBP < 100  Hyponatremia--> resolved: Na 135 today - Continue to trend BMP  AKI- Resolved: Cr .94<<--1.2<-- 1.5, potentially secondary to increased losses from OR, has maintained adequate UOP  - Follow BMP - encourage good PO  DM2: Last A1c 7.4 8/19, on glipizide, metformin at home. Still very elevated blood sugars -  required 16 total units SSI on top of lantus in last 24 hours - Give additional 3 units this AM to increase total lantus dose to 13 daily - SSI - CBG ACHS  Adjustment reaction: Progressing to acceptance slowly.  - Continue pastoral care as desired  History of atrial flutter 2012 s/p ablation in 2012 at [facility], currently in NSR,  - monitor closely - no need for telemetry at this time  GERD:  -PPI started  FEN/GI: KVO; Ensure Prophylaxis: SCDs  Dispo:  - f/u ortho reccs  - Case management following, will need wheel chair, walker  Subjective:  Denies SOB. Chest pain, able to tolerate PO without N/V. States left leg post op pain is " so so"  Objective: Temp:  [98 F (36.7 C)-99 F (37.2 C)] 99 F (37.2 C) (09/06 0526) Pulse Rate:  [86-102] 102 (09/05 2216) Resp:  [17-18] 17 (09/06 0526) BP: (128-154)/(63-87) 141/84 mmHg (09/06 0526) SpO2:  [100 %] 100 % (09/06 0526) Physical Exam: General: NAD, lying in bed, eating breakfast comfortably Cardiovascular: RRR Respiratory: CTAB Abdomen:soft, non tender, + BS Extremities: left LE wound dressing c/d/i, able to flex and extend left knee, right LE: no edema   Laboratory:  Recent Labs Lab 01/01/15 0700 01/02/15 0357 01/03/15 0524  WBC 11.3* 9.7 9.2  HGB 8.1* 7.4* 7.5*  HCT 24.6* 22.6* 22.4*  PLT 515* 510* 460*    Recent Labs Lab 12/29/14 1430 12/31/14 0131 01/01/15 0700 01/02/15 0357 01/03/15 0524  NA 137 132* 133* 133* 135  K 4.3 4.3 4.2 4.1 4.0  CL 105 100* 101 102 101  CO2 23 22  24 22 25   BUN <5* 8 8 10 9   CREATININE 0.76 0.95 1.15 1.20 0.94  CALCIUM 9.0 9.0 8.5* 8.7* 9.0  PROT 7.5 8.0  --   --   --   BILITOT 0.6 0.6  --   --   --   ALKPHOS 83 86  --   --   --   ALT 17 14*  --   --   --   AST 29 31  --   --   --   GLUCOSE 55* 190* 178* 213* 188*    Veatrice Bourbon, MD 01/03/2015, 8:18 AM PGY-2, [place] Intern pager: [phone], text pages welcome

## 2015-01-12 ENCOUNTER — Encounter: Payer: Self-pay | Admitting: Family Medicine

## 2015-01-12 ENCOUNTER — Ambulatory Visit (INDEPENDENT_AMBULATORY_CARE_PROVIDER_SITE_OTHER): Payer: Self-pay | Admitting: Family Medicine

## 2015-01-12 VITALS — BP 156/101 | HR 106 | Temp 98.1°F | Ht 75.0 in

## 2015-01-12 DIAGNOSIS — E1142 Type 2 diabetes mellitus with diabetic polyneuropathy: Secondary | ICD-10-CM

## 2015-01-12 DIAGNOSIS — I10 Essential (primary) hypertension: Secondary | ICD-10-CM

## 2015-01-12 DIAGNOSIS — Z89512 Acquired absence of left leg below knee: Secondary | ICD-10-CM

## 2015-01-12 DIAGNOSIS — S88112A Complete traumatic amputation at level between knee and ankle, left lower leg, initial encounter: Secondary | ICD-10-CM

## 2015-01-12 NOTE — Progress Notes (Signed)
   Subjective:    Patient ID: Ray Hall, male    DOB: 1960/11/12, 54 y.o.   MRN: [id number]  HPI  CC: hospital follow up  # Hospital f/u for Left BKA:  Feels he is doing okay with this currently, pain continues to be an issue for him  Getting phantom limb pain  Overall pain improved today, 6/10, usually up to 9/10.  Taking gabapentin 100mg  TID, was taking oxyIR on hospital DC but ran out and was prescribed oxy-apap (got script yesterday but hasn't filled because of lack of money --- discussed that he has been setup with CM and has applications for medicaid and disability started)  Doing okay with transfers  Has ortho f/u tomorrow ROS: +limb pain  # Diabetes  Taking metformin 1000mg  bid and glipizide 10mg  bid  Feels this is doing better  Cut out any sodas, overall hasn't had a lot of appetite.   Hasn't checked CBG in past 2 days, last one was 124 fasting. Fasting sugars mostly 120-140s he says ROS: no changes in voiding  # Hypertension  Taking lisinopril 20mg  as directed, took this morning  States adherent to medications  No side effects reported ROS: No CP, no SOB, no HA  Social Hx: former smoker, quit 1 month ago  Review of Systems   See HPI for ROS.   Past medical history, surgical, family, and social history reviewed and updated in the EMR as appropriate. Objective:  BP 156/101 mmHg  Pulse 106  Temp(Src) 98.1 F (36.7 C) (Oral)  Ht 6\' 3"  (1.905 m) Vitals and nursing note reviewed  General: NAD, seems to be in good spirits CV: RRR (borderline tachy), normal s1s2, no mrg. 2+ radial pulses bilaterally Resp: CTAB, normal effort MSK: left BKA with clean/dry/intact dressing Neuro: alert and oriented Psych: normal mood and affect, normal thought content/speech.   Assessment & Plan:  No problem-specific assessment & plan notes found for this encounter.

## 2015-01-12 NOTE — Patient Instructions (Signed)
Gabapentin: start increasing the dose to try and help with your limb pain.  Okay to increase by 100mg  per dose every 3 days, starting with increasing the night time dose.  Example: Take 100mg  AM and afternoon, then 200mg  at night for 3 days.  Increase dose during the day if you feel the 100mg  isn't helping over 3 days as well.  600mg  per dose MAX of gabapentin before seeing me again.

## 2015-01-13 ENCOUNTER — Other Ambulatory Visit: Payer: Self-pay | Admitting: Family Medicine

## 2015-01-13 DIAGNOSIS — S88112A Complete traumatic amputation at level between knee and ankle, left lower leg, initial encounter: Secondary | ICD-10-CM | POA: Insufficient documentation

## 2015-01-13 MED ORDER — LISINOPRIL 40 MG PO TABS: 40.0000 mg | ORAL_TABLET | Freq: Every day | ORAL | Status: DC

## 2015-01-13 NOTE — Assessment & Plan Note (Signed)
Has ortho appt tomorrow. Pain not well controlled, experiencing some phantom limb pain. Going to PT for this. Evaluation for prosthetic still a few months out. Recommend he titrate gabapentin to see if this can alleviate some of the phantom limb pain. F/u with ortho as directed.

## 2015-01-13 NOTE — Assessment & Plan Note (Signed)
Not at goal. Multiple visits with uncontrolled BP. Increase lisinopril to 40mg . F/u 3 months.

## 2015-02-24 ENCOUNTER — Inpatient Hospital Stay (HOSPITAL_COMMUNITY)
Admission: EM | Admit: 2015-02-24 | Discharge: 2015-03-03 | DRG: 698 | Disposition: A | Discharge status: Home / Self Care | Payer: Medicaid Other | Attending: Family Medicine | Admitting: Family Medicine

## 2015-02-24 ENCOUNTER — Encounter (HOSPITAL_COMMUNITY): Payer: Self-pay | Admitting: *Deleted

## 2015-02-24 ENCOUNTER — Emergency Department (HOSPITAL_COMMUNITY): Payer: Medicaid Other

## 2015-02-24 DIAGNOSIS — T454X5A Adverse effect of iron and its compounds, initial encounter: Secondary | ICD-10-CM | POA: Diagnosis not present

## 2015-02-24 DIAGNOSIS — N179 Acute kidney failure, unspecified: Secondary | ICD-10-CM | POA: Diagnosis present

## 2015-02-24 DIAGNOSIS — E43 Unspecified severe protein-calorie malnutrition: Secondary | ICD-10-CM | POA: Diagnosis present

## 2015-02-24 DIAGNOSIS — D573 Sickle-cell trait: Secondary | ICD-10-CM | POA: Diagnosis present

## 2015-02-24 DIAGNOSIS — Z1629 Resistance to other single specified antibiotic: Secondary | ICD-10-CM | POA: Diagnosis present

## 2015-02-24 DIAGNOSIS — Z6830 Body mass index (BMI) 30.0-30.9, adult: Secondary | ICD-10-CM | POA: Diagnosis not present

## 2015-02-24 DIAGNOSIS — E785 Hyperlipidemia, unspecified: Secondary | ICD-10-CM | POA: Diagnosis present

## 2015-02-24 DIAGNOSIS — I471 Supraventricular tachycardia: Secondary | ICD-10-CM | POA: Diagnosis present

## 2015-02-24 DIAGNOSIS — E119 Type 2 diabetes mellitus without complications: Secondary | ICD-10-CM

## 2015-02-24 DIAGNOSIS — T8351XA Infection and inflammatory reaction due to indwelling urinary catheter, initial encounter: Secondary | ICD-10-CM

## 2015-02-24 DIAGNOSIS — K5900 Constipation, unspecified: Secondary | ICD-10-CM | POA: Diagnosis present

## 2015-02-24 DIAGNOSIS — Z79899 Other long term (current) drug therapy: Secondary | ICD-10-CM | POA: Diagnosis not present

## 2015-02-24 DIAGNOSIS — Z7984 Long term (current) use of oral hypoglycemic drugs: Secondary | ICD-10-CM

## 2015-02-24 DIAGNOSIS — Z87891 Personal history of nicotine dependence: Secondary | ICD-10-CM

## 2015-02-24 DIAGNOSIS — Z87828 Personal history of other (healed) physical injury and trauma: Secondary | ICD-10-CM | POA: Diagnosis not present

## 2015-02-24 DIAGNOSIS — E1140 Type 2 diabetes mellitus with diabetic neuropathy, unspecified: Secondary | ICD-10-CM | POA: Diagnosis present

## 2015-02-24 DIAGNOSIS — N1330 Unspecified hydronephrosis: Secondary | ICD-10-CM | POA: Diagnosis present

## 2015-02-24 DIAGNOSIS — D500 Iron deficiency anemia secondary to blood loss (chronic): Secondary | ICD-10-CM | POA: Insufficient documentation

## 2015-02-24 DIAGNOSIS — E1100 Type 2 diabetes mellitus with hyperosmolarity without nonketotic hyperglycemic-hyperosmolar coma (NKHHC): Secondary | ICD-10-CM | POA: Diagnosis present

## 2015-02-24 DIAGNOSIS — B3781 Candidal esophagitis: Secondary | ICD-10-CM | POA: Diagnosis present

## 2015-02-24 DIAGNOSIS — K219 Gastro-esophageal reflux disease without esophagitis: Secondary | ICD-10-CM | POA: Diagnosis present

## 2015-02-24 DIAGNOSIS — E875 Hyperkalemia: Secondary | ICD-10-CM | POA: Diagnosis present

## 2015-02-24 DIAGNOSIS — A4159 Other Gram-negative sepsis: Secondary | ICD-10-CM | POA: Diagnosis present

## 2015-02-24 DIAGNOSIS — Z87440 Personal history of urinary (tract) infections: Secondary | ICD-10-CM | POA: Diagnosis present

## 2015-02-24 DIAGNOSIS — N318 Other neuromuscular dysfunction of bladder: Secondary | ICD-10-CM | POA: Diagnosis present

## 2015-02-24 DIAGNOSIS — Z833 Family history of diabetes mellitus: Secondary | ICD-10-CM

## 2015-02-24 DIAGNOSIS — R079 Chest pain, unspecified: Secondary | ICD-10-CM | POA: Diagnosis present

## 2015-02-24 DIAGNOSIS — T886XXA Anaphylactic reaction due to adverse effect of correct drug or medicament properly administered, initial encounter: Secondary | ICD-10-CM | POA: Diagnosis not present

## 2015-02-24 DIAGNOSIS — Z89512 Acquired absence of left leg below knee: Secondary | ICD-10-CM | POA: Diagnosis not present

## 2015-02-24 DIAGNOSIS — N390 Urinary tract infection, site not specified: Secondary | ICD-10-CM | POA: Diagnosis present

## 2015-02-24 DIAGNOSIS — N319 Neuromuscular dysfunction of bladder, unspecified: Secondary | ICD-10-CM | POA: Diagnosis present

## 2015-02-24 DIAGNOSIS — T83518A Infection and inflammatory reaction due to other urinary catheter, initial encounter: Secondary | ICD-10-CM | POA: Diagnosis present

## 2015-02-24 DIAGNOSIS — R6520 Severe sepsis without septic shock: Secondary | ICD-10-CM | POA: Diagnosis present

## 2015-02-24 DIAGNOSIS — I10 Essential (primary) hypertension: Secondary | ICD-10-CM | POA: Diagnosis present

## 2015-02-24 DIAGNOSIS — E872 Acidosis: Secondary | ICD-10-CM | POA: Diagnosis present

## 2015-02-24 DIAGNOSIS — A414 Sepsis due to anaerobes: Secondary | ICD-10-CM | POA: Insufficient documentation

## 2015-02-24 DIAGNOSIS — Z1611 Resistance to penicillins: Secondary | ICD-10-CM | POA: Diagnosis present

## 2015-02-24 DIAGNOSIS — D509 Iron deficiency anemia, unspecified: Secondary | ICD-10-CM | POA: Diagnosis present

## 2015-02-24 HISTORY — DX: Iron deficiency anemia, unspecified: D50.9

## 2015-02-24 HISTORY — DX: Neuromuscular dysfunction of bladder, unspecified: N31.9

## 2015-02-24 LAB — BASIC METABOLIC PANEL
ANION GAP: 12 (ref 5–15)
BUN: 56 mg/dL — ABNORMAL HIGH (ref 6–20)
CO2: 19 mmol/L — ABNORMAL LOW (ref 22–32)
Calcium: 9.5 mg/dL (ref 8.9–10.3)
Chloride: 99 mmol/L — ABNORMAL LOW (ref 101–111)
Creatinine, Ser: 2.29 mg/dL — ABNORMAL HIGH (ref 0.61–1.24)
GFR calc Af Amer: 36 mL/min — ABNORMAL LOW (ref >60)
GFR, EST NON AFRICAN AMERICAN: 31 mL/min — AB (ref >60)
GLUCOSE: 167 mg/dL — AB (ref 65–99)
POTASSIUM: 5.1 mmol/L (ref 3.5–5.1)
SODIUM: 130 mmol/L — AB (ref 135–145)

## 2015-02-24 LAB — CBC
HEMATOCRIT: 26.5 % — AB (ref 39.0–52.0)
HEMOGLOBIN: 8.6 g/dL — AB (ref 13.0–17.0)
MCH: 22.1 pg — ABNORMAL LOW (ref 26.0–34.0)
MCHC: 32.5 g/dL (ref 30.0–36.0)
MCV: 68.1 fL — ABNORMAL LOW (ref 78.0–100.0)
Platelets: 708 10*3/uL — ABNORMAL HIGH (ref 150–400)
RBC: 3.89 MIL/uL — AB (ref 4.22–5.81)
RDW: 19.4 % — ABNORMAL HIGH (ref 11.5–15.5)
WBC: 10.5 10*3/uL (ref 4.0–10.5)

## 2015-02-24 LAB — URINALYSIS, ROUTINE W REFLEX MICROSCOPIC
Bilirubin Urine: NEGATIVE
GLUCOSE, UA: NEGATIVE mg/dL
KETONES UR: NEGATIVE mg/dL
Nitrite: NEGATIVE
PH: 6 (ref 5.0–8.0)
PROTEIN: 30 mg/dL — AB
Specific Gravity, Urine: 1.013 (ref 1.005–1.030)
UROBILINOGEN UA: 1 mg/dL (ref 0.0–1.0)

## 2015-02-24 LAB — URINE MICROSCOPIC-ADD ON

## 2015-02-24 LAB — I-STAT CG4 LACTIC ACID, ED: LACTIC ACID, VENOUS: 3.43 mmol/L — AB (ref 0.5–2.0)

## 2015-02-24 LAB — I-STAT TROPONIN, ED: Troponin i, poc: 0 ng/mL (ref 0.00–0.08)

## 2015-02-24 LAB — CBG MONITORING, ED: Glucose-Capillary: 156 mg/dL — ABNORMAL HIGH (ref 65–99)

## 2015-02-24 LAB — GLUCOSE, CAPILLARY
GLUCOSE-CAPILLARY: 67 mg/dL (ref 65–99)
GLUCOSE-CAPILLARY: 130 mg/dL — AB (ref 65–99)
GLUCOSE-CAPILLARY: 54 mg/dL — AB (ref 65–99)
Glucose-Capillary: 69 mg/dL (ref 65–99)

## 2015-02-24 MED ORDER — SODIUM CHLORIDE 0.9 % IV SOLN
INTRAVENOUS | Status: DC
  Administered 2015-02-24 – 2015-02-28 (×10): via INTRAVENOUS

## 2015-02-24 MED ORDER — GI COCKTAIL ~~LOC~~
30.0000 mL | Freq: Once | ORAL | Status: AC
  Administered 2015-02-24: 30 mL via ORAL
  Filled 2015-02-24: qty 30

## 2015-02-24 MED ORDER — FAMOTIDINE 20 MG PO TABS
20.0000 mg | ORAL_TABLET | Freq: Every day | ORAL | Status: DC
  Administered 2015-02-25 – 2015-03-03 (×7): 20 mg via ORAL
  Filled 2015-02-24 (×7): qty 1

## 2015-02-24 MED ORDER — ACETAMINOPHEN 650 MG RE SUPP: 650.0000 mg | Freq: Four times a day (QID) | RECTAL | Status: DC | PRN

## 2015-02-24 MED ORDER — ONDANSETRON HCL 4 MG/2ML IJ SOLN
4.0000 mg | Freq: Four times a day (QID) | INTRAMUSCULAR | Status: DC | PRN
  Administered 2015-02-25: 4 mg via INTRAVENOUS
  Filled 2015-02-24: qty 2

## 2015-02-24 MED ORDER — ONDANSETRON HCL 4 MG PO TABS: 4.0000 mg | ORAL_TABLET | Freq: Four times a day (QID) | ORAL | Status: DC | PRN

## 2015-02-24 MED ORDER — ASPIRIN EC 81 MG PO TBEC
81.0000 mg | DELAYED_RELEASE_TABLET | Freq: Every day | ORAL | Status: DC
  Administered 2015-02-24 – 2015-02-26 (×3): 81 mg via ORAL
  Filled 2015-02-24 (×3): qty 1

## 2015-02-24 MED ORDER — INSULIN ASPART 100 UNIT/ML ~~LOC~~ SOLN
0.0000 [IU] | Freq: Three times a day (TID) | SUBCUTANEOUS | Status: DC
  Administered 2015-02-25: 2 [IU] via SUBCUTANEOUS
  Administered 2015-02-25 – 2015-02-27 (×3): 1 [IU] via SUBCUTANEOUS
  Administered 2015-02-27: 2 [IU] via SUBCUTANEOUS
  Administered 2015-02-28: 3 [IU] via SUBCUTANEOUS
  Administered 2015-02-28: 5 [IU] via SUBCUTANEOUS
  Administered 2015-02-28: 7 [IU] via SUBCUTANEOUS
  Administered 2015-03-01 – 2015-03-02 (×4): 1 [IU] via SUBCUTANEOUS
  Administered 2015-03-02: 2 [IU] via SUBCUTANEOUS
  Administered 2015-03-03: 1 [IU] via SUBCUTANEOUS

## 2015-02-24 MED ORDER — HEPARIN SODIUM (PORCINE) 5000 UNIT/ML IJ SOLN
5000.0000 [IU] | Freq: Three times a day (TID) | INTRAMUSCULAR | Status: DC
  Administered 2015-02-24: 5000 [IU] via SUBCUTANEOUS
  Filled 2015-02-24 (×6): qty 1

## 2015-02-24 MED ORDER — DEXTROSE 50 % IV SOLN
INTRAVENOUS | Status: AC
  Administered 2015-02-24: 50 mL
  Filled 2015-02-24: qty 50

## 2015-02-24 MED ORDER — CIPROFLOXACIN IN D5W 400 MG/200ML IV SOLN: 400.0000 mg | Freq: Two times a day (BID) | INTRAVENOUS | Status: DC

## 2015-02-24 MED ORDER — SODIUM CHLORIDE 0.9 % IV BOLUS (SEPSIS)
1000.0000 mL | Freq: Once | INTRAVENOUS | Status: AC
  Administered 2015-02-24: 1000 mL via INTRAVENOUS

## 2015-02-24 MED ORDER — PIPERACILLIN-TAZOBACTAM 3.375 G IVPB
3.3750 g | Freq: Three times a day (TID) | INTRAVENOUS | Status: DC
  Administered 2015-02-25 – 2015-02-27 (×7): 3.375 g via INTRAVENOUS
  Filled 2015-02-24 (×9): qty 50

## 2015-02-24 MED ORDER — GABAPENTIN 100 MG PO CAPS
100.0000 mg | ORAL_CAPSULE | Freq: Two times a day (BID) | ORAL | Status: DC
  Administered 2015-02-24 – 2015-03-03 (×13): 100 mg via ORAL
  Filled 2015-02-24 (×13): qty 1

## 2015-02-24 MED ORDER — PIPERACILLIN-TAZOBACTAM 3.375 G IVPB 30 MIN
3.3750 g | Freq: Once | INTRAVENOUS | Status: DC
  Filled 2015-02-24: qty 50

## 2015-02-24 MED ORDER — BOOST / RESOURCE BREEZE PO LIQD
1.0000 | Freq: Three times a day (TID) | ORAL | Status: DC
  Administered 2015-02-24: 1 via ORAL

## 2015-02-24 MED ORDER — ACETAMINOPHEN 325 MG PO TABS
650.0000 mg | ORAL_TABLET | Freq: Four times a day (QID) | ORAL | Status: DC | PRN
  Administered 2015-02-25 – 2015-02-27 (×4): 650 mg via ORAL
  Filled 2015-02-24 (×4): qty 2

## 2015-02-24 MED ORDER — ATORVASTATIN CALCIUM 40 MG PO TABS
40.0000 mg | ORAL_TABLET | Freq: Every day | ORAL | Status: DC
  Administered 2015-02-25 – 2015-03-01 (×5): 40 mg via ORAL
  Filled 2015-02-24 (×6): qty 1

## 2015-02-24 MED ORDER — DEXTROSE 5 % IV SOLN
1.0000 g | Freq: Three times a day (TID) | INTRAVENOUS | Status: DC
  Filled 2015-02-24 (×2): qty 1

## 2015-02-24 NOTE — ED Notes (Signed)
REport attempted x1.

## 2015-02-24 NOTE — Progress Notes (Signed)
ANTIBIOTIC CONSULT NOTE - INITIAL  Pharmacy Consult for Zosyn Indication: sepsis of urinary tract origin    Vital Signs: Temp: 98.4 F (36.9 C) (10/28 2018) Temp Source: Oral (10/28 2018) BP: 123/94 mmHg (10/28 1930) Pulse Rate: 112 (10/28 1930) Intake/Output from previous day:    Labs:  Recent Labs  02/24/15 1556  WBC 10.5  HGB 8.6*  PLT 708*  CREATININE 2.29*  CrCl: > 20 ml/min - baseline SCr ~ 1   Microbiology: Cx data: 10/28: urine px  Anti-infective's  Zosyn: 10/28 <<  Assessment: 53yoM admitted with fever, dysuria lactic 3.4 and UA suggestive of UTI in setting of chronic in out cath at home on every other day macrobid.   Goal of Therapy:  Treatment of infection  Plan:  1. Zosyn 3.375 gm x 1 now over 30 min; then EI 3.375 gm Q8H afterwards 2. F/u cx results and narrow abx as feasible   Vincenza Hews, PharmD, BCPS 02/24/2015, 9:24 PM Pager: [phone]
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
## 2015-02-24 NOTE — ED Notes (Signed)
Admitting at bedside 

## 2015-02-24 NOTE — ED Provider Notes (Signed)
CSN: [id number]     Arrival date & time 02/24/15  1521 History   First MD Initiated Contact with Patient 02/24/15 1540     Chief Complaint  Patient presents with  . Chest Pain  . Dizziness     (Consider location/radiation/quality/duration/timing/severity/associated sxs/prior Treatment) Patient is a 54 y.o. male presenting with chest pain.  Chest Pain Pain location:  Substernal area Pain quality: sharp and tightness   Pain radiates to:  Does not radiate Pain radiates to the back: no   Pain severity:  Moderate Onset quality:  Gradual Duration:  1 day Timing:  Constant Chronicity:  New Relieved by:  Nothing Worsened by:  Nothing tried Associated symptoms: fever (tmax 102.7 2 days ago) and syncope   Associated symptoms: no abdominal pain, no cough and no shortness of breath     Past Medical History  Diagnosis Date  . Sickle cell trait ([place])   . Normal nuclear stress test 07/2010    low prob of ischemia, EF 42%  . Echocardiogram abnormal     moderate LVH, mild LV hypokenesis, EF 42%  . History of Doppler ultrasound 07/2010    negative for DVT  . Abnormal CT of the abdomen     mild L hydronephrosis and hydroureter  . Status post radiofrequency ablation for arrhythmia 10/16/10    [facility] R[facility] MD)  . Diabetes mellitus   . Hypertension   . Osteomyelitis ([place]) 12/2014    LEFT FOOT  . Microcytic anemia 02/25/2015  . 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 Followed at [facility] urology.    Past Surgical History  Procedure Laterality Date  . Radiofrequency ablation  10/16/10    Cardiac for atrial arrythmia, [facility]  . Dental extractions  10/15/10    prior to ablation  . Patella fracture surgery      metal rod left leg  . Tonsillectomy    . Rotator cuff repair Left 10/2014  . Amputation Left 12/31/2014    Procedure: AMPUTATION BELOW KNEE;  Surgeon: Newt Minion, MD;  Location: [place];  Service: Orthopedics;  Laterality: Left;   Family History   Problem Relation Age of Onset  . Sickle cell trait    . Heart failure Father   . Diabetes Father   . Diabetes Mother   . Hypertension Mother    Social History  Substance Use Topics  . Smoking status: Former Smoker -- 1.50 packs/day for 17 years    Types: Cigars    Quit date: 12/15/2014  . Smokeless tobacco: Never Used     Comment: 2 cigars  . Alcohol Use: No    Review of Systems  Constitutional: Positive for fever (tmax 102.7 2 days ago).  Respiratory: Negative for cough and shortness of breath.   Cardiovascular: Positive for chest pain and syncope.  Gastrointestinal: Negative for abdominal pain.  All other systems reviewed and are negative.     Allergies  Infed and Lactose intolerance (gi)  Home Medications   Prior to Admission medications   Medication Sig Start Date End Date Taking? Authorizing Provider  Aspirin-Caffeine (BC FAST PAIN RELIEF ARTHRITIS) 1000-65 MG PACK Take 1 Package by mouth daily as needed (for pain).   Yes Historical Provider, MD  cyclobenzaprine (FLEXERIL) 10 MG tablet TAKE ONE TABLET BY MOUTH THREE TIMES DAILY AS NEEDED FOR MUSCLE SPASM 01/13/15  Yes Leone Brand, MD  diphenhydramine-acetaminophen (TYLENOL PM) 25-500 MG TABS Take 2 tablets by mouth at bedtime.  Yes Historical Provider, MD  ENSURE (ENSURE) Take 237 mLs by mouth 2 (two) times daily between meals.   Yes Historical Provider, MD  gabapentin (NEURONTIN) 100 MG capsule Take 1 capsule (100 mg total) by mouth 3 (three) times daily. Patient taking differently: Take 100 mg by mouth 2 (two) times daily.  07/21/14  Yes Leone Brand, MD  glipiZIDE (GLUCOTROL) 10 MG tablet TAKE ONE TABLET BY MOUTH TWICE DAILY BEFORE MEAL(S) 09/07/14  Yes Leone Brand, MD  lisinopril (PRINIVIL,ZESTRIL) 40 MG tablet Take 1 tablet (40 mg total) by mouth daily. 01/13/15  Yes Leone Brand, MD  metFORMIN (GLUCOPHAGE) 1000 MG tablet Take 1 tablet (1,000 mg total) by mouth 2 (two) times daily with a meal. 09/07/14  Yes  Leone Brand, MD  ranitidine (ZANTAC) 150 MG tablet Take 1 tablet (150 mg total) by mouth 2 (two) times daily. 12/23/14  Yes Lupita Dawn, MD  vitamin B-12 1000 MCG tablet Take 1 tablet (1,000 mcg total) by mouth daily. 12/20/14  Yes Hillary Corinda Gubler, MD  aspirin EC 81 MG tablet Take 1 tablet (81 mg total) by mouth daily. Patient not taking: Reported on 02/24/2015 01/03/15   Veatrice Bourbon, MD  atorvastatin (LIPITOR) 40 MG tablet Take 1 tablet (40 mg total) by mouth daily at 6 PM. Patient not taking: Reported on 02/24/2015 01/03/15   Veatrice Bourbon, MD  oxyCODONE (OXY IR/ROXICODONE) 5 MG immediate release tablet Take 1-2 tablets (5-10 mg total) by mouth every 3 (three) hours as needed for breakthrough pain. Patient not taking: Reported on 02/24/2015 01/03/15   Veatrice Bourbon, MD  SUMAtriptan (IMITREX) 50 MG tablet Take 1 tablet (50 mg total) by mouth every 2 (two) hours as needed for migraine or headache. May repeat in 2 hours if headache persists or recurs. Do not exceed 3 doses in 24 hours. Patient not taking: Reported on 02/24/2015 12/23/14   Lupita Dawn, MD   BP 141/89 mmHg  Pulse 92  Temp(Src) 98.5 F (36.9 C) (Oral)  Resp 18  Ht 6\' 8"  (2.032 m)  Wt 270 lb 12.8 oz (122.834 kg)  BMI 29.75 kg/m2  SpO2 100% Physical Exam  Constitutional: He is oriented to person, place, and time. He appears well-developed and well-nourished.  HENT:  Head: Normocephalic and atraumatic.  Eyes: Conjunctivae and EOM are normal.  Neck: Normal range of motion. Neck supple.  Cardiovascular: Normal rate, regular rhythm and normal heart sounds.   Pulmonary/Chest: Effort normal and breath sounds normal. No respiratory distress.  Abdominal: He exhibits no distension. There is no tenderness. There is no rebound and no guarding.  Musculoskeletal: Normal range of motion.  Neurological: He is alert and oriented to person, place, and time.  Skin: Skin is warm and dry.  Vitals reviewed.   ED Course   Procedures (including critical care time) Labs Review Labs Reviewed  BASIC METABOLIC PANEL - Abnormal; Notable for the following:    Sodium 130 (*)    Chloride 99 (*)    CO2 19 (*)    Glucose, Bld 167 (*)    BUN 56 (*)    Creatinine, Ser 2.29 (*)    GFR calc non Af Amer 31 (*)    GFR calc Af Amer 36 (*)    All other components within normal limits  CBC - Abnormal; Notable for the following:    RBC 3.89 (*)    Hemoglobin 8.6 (*)    HCT 26.5 (*)  MCV 68.1 (*)    MCH 22.1 (*)    RDW 19.4 (*)    Platelets 708 (*)    All other components within normal limits  URINALYSIS, ROUTINE W REFLEX MICROSCOPIC (NOT AT [facility]) - Abnormal; Notable for the following:    Color, Urine AMBER (*)    APPearance TURBID (*)    Hgb urine dipstick MODERATE (*)    Protein, ur 30 (*)    Leukocytes, UA LARGE (*)    All other components within normal limits  URINE MICROSCOPIC-ADD ON - Abnormal; Notable for the following:    Bacteria, UA MANY (*)    All other components within normal limits  HEMOGLOBIN A1C - Abnormal; Notable for the following:    Hgb A1c MFr Bld 6.5 (*)    All other components within normal limits  BASIC METABOLIC PANEL - Abnormal; Notable for the following:    Potassium 5.3 (*)    CO2 20 (*)    Glucose, Bld 161 (*)    BUN 50 (*)    Creatinine, Ser 1.81 (*)    GFR calc non Af Amer 42 (*)    GFR calc Af Amer 48 (*)    All other components within normal limits  CBC - Abnormal; Notable for the following:    WBC 13.5 (*)    RBC 3.35 (*)    Hemoglobin 7.6 (*)    HCT 23.0 (*)    MCV 68.7 (*)    MCH 22.7 (*)    RDW 19.8 (*)    Platelets 605 (*)    All other components within normal limits  GLUCOSE, CAPILLARY - Abnormal; Notable for the following:    Glucose-Capillary 54 (*)    All other components within normal limits  GLUCOSE, CAPILLARY - Abnormal; Notable for the following:    Glucose-Capillary 130 (*)    All other components within normal limits  GLUCOSE, CAPILLARY -  Abnormal; Notable for the following:    Glucose-Capillary 108 (*)    All other components within normal limits  LACTIC ACID, PLASMA - Abnormal; Notable for the following:    Lactic Acid, Venous 2.4 (*)    All other components within normal limits  LACTIC ACID, PLASMA - Abnormal; Notable for the following:    Lactic Acid, Venous 2.8 (*)    All other components within normal limits  GLUCOSE, CAPILLARY - Abnormal; Notable for the following:    Glucose-Capillary 174 (*)    All other components within normal limits  GLUCOSE, CAPILLARY - Abnormal; Notable for the following:    Glucose-Capillary 141 (*)    All other components within normal limits  GLUCOSE, CAPILLARY - Abnormal; Notable for the following:    Glucose-Capillary 139 (*)    All other components within normal limits  GLUCOSE, CAPILLARY - Abnormal; Notable for the following:    Glucose-Capillary 159 (*)    All other components within normal limits  RETICULOCYTES - Abnormal; Notable for the following:    RBC. 2.84 (*)    All other components within normal limits  FERRITIN - Abnormal; Notable for the following:    Ferritin 599 (*)    All other components within normal limits  IRON AND TIBC - Abnormal; Notable for the following:    Iron 8 (*)    TIBC 169 (*)    Saturation Ratios 5 (*)    All other components within normal limits  LACTIC ACID, PLASMA - Abnormal; Notable for the following:    Lactic Acid, Venous  2.1 (*)    All other components within normal limits  GLUCOSE, CAPILLARY - Abnormal; Notable for the following:    Glucose-Capillary 192 (*)    All other components within normal limits  GLUCOSE, CAPILLARY - Abnormal; Notable for the following:    Glucose-Capillary 141 (*)    All other components within normal limits  BASIC METABOLIC PANEL - Abnormal; Notable for the following:    CO2 18 (*)    Glucose, Bld 192 (*)    BUN 32 (*)    Creatinine, Ser 1.40 (*)    Calcium 8.7 (*)    GFR calc non Af Amer 57 (*)    All  other components within normal limits  CBC - Abnormal; Notable for the following:    WBC 18.9 (*)    RBC 3.03 (*)    Hemoglobin 6.8 (*)    HCT 21.0 (*)    MCV 69.3 (*)    MCH 22.4 (*)    RDW 19.7 (*)    Platelets 435 (*)    All other components within normal limits  CBC - Abnormal; Notable for the following:    WBC 18.7 (*)    RBC 2.78 (*)    Hemoglobin 6.2 (*)    HCT 19.1 (*)    MCV 68.7 (*)    MCH 22.3 (*)    RDW 19.7 (*)    Platelets 431 (*)    All other components within normal limits  GLUCOSE, CAPILLARY - Abnormal; Notable for the following:    Glucose-Capillary 157 (*)    All other components within normal limits  GLUCOSE, CAPILLARY - Abnormal; Notable for the following:    Glucose-Capillary 121 (*)    All other components within normal limits  HEMOGLOBIN AND HEMATOCRIT, BLOOD - Abnormal; Notable for the following:    Hemoglobin 7.0 (*)    HCT 21.2 (*)    All other components within normal limits  CBC - Abnormal; Notable for the following:    WBC 15.4 (*)    RBC 2.76 (*)    Hemoglobin 6.6 (*)    HCT 19.1 (*)    MCV 69.2 (*)    MCH 23.9 (*)    RDW 20.0 (*)    All other components within normal limits  BASIC METABOLIC PANEL - Abnormal; Notable for the following:    CO2 19 (*)    Glucose, Bld 104 (*)    BUN 21 (*)    Calcium 8.7 (*)    All other components within normal limits  GLUCOSE, CAPILLARY - Abnormal; Notable for the following:    Glucose-Capillary 174 (*)    All other components within normal limits  GLUCOSE, CAPILLARY - Abnormal; Notable for the following:    Glucose-Capillary 109 (*)    All other components within normal limits  HEMOGLOBIN AND HEMATOCRIT, BLOOD - Abnormal; Notable for the following:    Hemoglobin 11.0 (*)    HCT 33.0 (*)    All other components within normal limits  GLUCOSE, CAPILLARY - Abnormal; Notable for the following:    Glucose-Capillary 147 (*)    All other components within normal limits  HAPTOGLOBIN - Abnormal; Notable  for the following:    Haptoglobin 422 (*)    All other components within normal limits  LACTATE DEHYDROGENASE - Abnormal; Notable for the following:    LDH 242 (*)    All other components within normal limits  GLUCOSE, CAPILLARY - Abnormal; Notable for the following:    Glucose-Capillary 124 (*)  All other components within normal limits  GLUCOSE, CAPILLARY - Abnormal; Notable for the following:    Glucose-Capillary 169 (*)    All other components within normal limits  BASIC METABOLIC PANEL - Abnormal; Notable for the following:    Sodium 129 (*)    CO2 18 (*)    Glucose, Bld 331 (*)    BUN 22 (*)    Creatinine, Ser 1.28 (*)    Calcium 7.9 (*)    All other components within normal limits  CBC - Abnormal; Notable for the following:    WBC 22.3 (*)    RBC 3.47 (*)    Hemoglobin 8.0 (*)    HCT 24.3 (*)    MCV 70.0 (*)    MCH 23.1 (*)    RDW 19.5 (*)    Platelets 424 (*)    All other components within normal limits  GLUCOSE, CAPILLARY - Abnormal; Notable for the following:    Glucose-Capillary 287 (*)    All other components within normal limits  GLUCOSE, CAPILLARY - Abnormal; Notable for the following:    Glucose-Capillary 297 (*)    All other components within normal limits  GLUCOSE, CAPILLARY - Abnormal; Notable for the following:    Glucose-Capillary 309 (*)    All other components within normal limits  GLUCOSE, CAPILLARY - Abnormal; Notable for the following:    Glucose-Capillary 227 (*)    All other components within normal limits  GLUCOSE, CAPILLARY - Abnormal; Notable for the following:    Glucose-Capillary 218 (*)    All other components within normal limits  CBG MONITORING, ED - Abnormal; Notable for the following:    Glucose-Capillary 156 (*)    All other components within normal limits  I-STAT CG4 LACTIC ACID, ED - Abnormal; Notable for the following:    Lactic Acid, Venous 3.43 (*)    All other components within normal limits  CULTURE, BLOOD (ROUTINE X 2)   CULTURE, BLOOD (ROUTINE X 2)  URINE CULTURE  CULTURE, BLOOD (ROUTINE X 2)  CULTURE, BLOOD (ROUTINE X 2)  GLUCOSE, CAPILLARY  GLUCOSE, CAPILLARY  OCCULT BLOOD X 1 CARD TO LAB, STOOL  LACTIC ACID, PLASMA  SAVE SMEAR  CBC  BASIC METABOLIC PANEL  I-STAT TROPOININ, ED  I-STAT CG4 LACTIC ACID, ED  TYPE AND SCREEN  PREPARE RBC (CROSSMATCH)  PREPARE RBC (CROSSMATCH)    Imaging Review US Renal  02/27/2015  CLINICAL DATA:  Sepsis due to Klebsiella (HCC) A41.4 (ICD-10-CM) EXAM: RENAL / URINARY TRACT ULTRASOUND COMPLETE COMPARISON:  None. FINDINGS: Right Kidney: Length: 15.7. Moderate hydronephrosis. Normal parenchymal echogenicity. No mass or stone. Left Kidney: Length: 14.5 cm. Moderate hydronephrosis. Normal parenchymal echogenicity. No mass or stone. Bladder: Some floating debris, but no mass or wall thickening. No ureteral jet seen on either side. IMPRESSION: 1. Moderate bilateral hydronephrosis of unclear etiology. Consider follow-up contrast-enhanced CT of the abdomen pelvis. 2. No renal masses or stones. 3. Small amount of bladder debris, but no wall thickening or bladder mass or stone. Electronically Signed   By: Lajean Manes M.D.   On: 02/27/2015 08:06   I have personally reviewed and evaluated these images and lab results as part of my medical decision-making.   EKG Interpretation   Date/Time:  Friday February 24 2015 15:30:03 EDT Ventricular Rate:  122 PR Interval:  168 QRS Duration: 90 QT Interval:  330 QTC Calculation: 470 R Axis:   52 Text Interpretation:  Sinus tachycardia Possible Anterior infarct , age  undetermined Abnormal ECG  No significant change since last tracing  Confirmed by Debby Freiberg [phone]) on 02/24/2015 3:40:33 PM      MDM   Final diagnoses:  Sepsis due to Klebsiella [facility])    54 y.o. male with pertinent PMH of DM, HTN, neurogenic bladder presents with chest pain as above.  Tachycardic and hypotensive.  Admitted in stable condition.    I have  reviewed all laboratory and imaging studies if ordered as above  1. Sepsis due to Klebsiella [facility])         Debby Freiberg, MD 03/01/15 [phone]

## 2015-02-24 NOTE — ED Notes (Signed)
Pt reports not feeling well x 1 week, having intermittent n/v and feeling lightheaded. approx 45 mins ago had increase in symptoms, reports SOB, CP, dizziness,neck pain. Pt is hypotensive at triage.

## 2015-02-24 NOTE — ED Notes (Signed)
Dr. Gentry at bedside. 

## 2015-02-25 ENCOUNTER — Encounter (HOSPITAL_COMMUNITY): Payer: Self-pay | Admitting: Family Medicine

## 2015-02-25 DIAGNOSIS — D509 Iron deficiency anemia, unspecified: Secondary | ICD-10-CM

## 2015-02-25 DIAGNOSIS — E43 Unspecified severe protein-calorie malnutrition: Secondary | ICD-10-CM | POA: Diagnosis present

## 2015-02-25 DIAGNOSIS — E1100 Type 2 diabetes mellitus with hyperosmolarity without nonketotic hyperglycemic-hyperosmolar coma (NKHHC): Secondary | ICD-10-CM | POA: Diagnosis present

## 2015-02-25 DIAGNOSIS — N319 Neuromuscular dysfunction of bladder, unspecified: Secondary | ICD-10-CM

## 2015-02-25 HISTORY — DX: Neuromuscular dysfunction of bladder, unspecified: N31.9

## 2015-02-25 HISTORY — DX: Iron deficiency anemia, unspecified: D50.9

## 2015-02-25 LAB — CBC
HCT: 23 % — ABNORMAL LOW (ref 39.0–52.0)
HEMOGLOBIN: 7.6 g/dL — AB (ref 13.0–17.0)
MCH: 22.7 pg — AB (ref 26.0–34.0)
MCHC: 33 g/dL (ref 30.0–36.0)
MCV: 68.7 fL — ABNORMAL LOW (ref 78.0–100.0)
PLATELETS: 605 10*3/uL — AB (ref 150–400)
RBC: 3.35 MIL/uL — AB (ref 4.22–5.81)
RDW: 19.8 % — ABNORMAL HIGH (ref 11.5–15.5)
WBC: 13.5 10*3/uL — AB (ref 4.0–10.5)

## 2015-02-25 LAB — GLUCOSE, CAPILLARY
GLUCOSE-CAPILLARY: 108 mg/dL — AB (ref 65–99)
GLUCOSE-CAPILLARY: 174 mg/dL — AB (ref 65–99)
GLUCOSE-CAPILLARY: 141 mg/dL — AB (ref 65–99)
GLUCOSE-CAPILLARY: 139 mg/dL — AB (ref 65–99)
GLUCOSE-CAPILLARY: 192 mg/dL — AB (ref 65–99)
Glucose-Capillary: 159 mg/dL — ABNORMAL HIGH (ref 65–99)

## 2015-02-25 LAB — BASIC METABOLIC PANEL
ANION GAP: 13 (ref 5–15)
BUN: 50 mg/dL — ABNORMAL HIGH (ref 6–20)
CALCIUM: 9 mg/dL (ref 8.9–10.3)
CO2: 20 mmol/L — AB (ref 22–32)
Chloride: 103 mmol/L (ref 101–111)
Creatinine, Ser: 1.81 mg/dL — ABNORMAL HIGH (ref 0.61–1.24)
GFR calc non Af Amer: 42 mL/min — ABNORMAL LOW (ref >60)
GFR, EST AFRICAN AMERICAN: 48 mL/min — AB (ref >60)
Glucose, Bld: 161 mg/dL — ABNORMAL HIGH (ref 65–99)
Potassium: 5.3 mmol/L — ABNORMAL HIGH (ref 3.5–5.1)
SODIUM: 136 mmol/L (ref 135–145)

## 2015-02-25 LAB — LACTIC ACID, PLASMA
LACTIC ACID, VENOUS: 2.4 mmol/L — AB (ref 0.5–2.0)
LACTIC ACID, VENOUS: 2.8 mmol/L — AB (ref 0.5–2.0)

## 2015-02-25 MED ORDER — PRO-STAT SUGAR FREE PO LIQD
60.0000 mL | Freq: Two times a day (BID) | ORAL | Status: DC
  Administered 2015-02-25: 30 mL via ORAL
  Administered 2015-02-27 – 2015-03-03 (×2): 60 mL via ORAL
  Filled 2015-02-25 (×7): qty 60

## 2015-02-25 MED ORDER — LORAZEPAM 1 MG PO TABS
1.0000 mg | ORAL_TABLET | Freq: Once | ORAL | Status: AC
  Administered 2015-02-25: 1 mg via ORAL
  Filled 2015-02-25: qty 1

## 2015-02-25 MED ORDER — ENSURE ENLIVE PO LIQD
237.0000 mL | Freq: Two times a day (BID) | ORAL | Status: DC
  Administered 2015-02-25 – 2015-03-03 (×5): 237 mL via ORAL

## 2015-02-25 MED ORDER — SODIUM CHLORIDE 0.9 % IV BOLUS (SEPSIS)
1000.0000 mL | Freq: Once | INTRAVENOUS | Status: AC
  Administered 2015-02-25: 1000 mL via INTRAVENOUS

## 2015-02-25 NOTE — Progress Notes (Signed)
Hypoglycemic Event  CBG: 67  Treatment: 15 GM carbohydrate snack and D50 IV 50 mL  Symptoms: None  Follow-up CBG: Time:2102 CBG Result:69,  21:39 CBG result 54, 22:08 CBG result 130  Possible Reasons for Event: Unknown  Comments/MD notified:NO    Shylynn Bruning Olufunke

## 2015-02-25 NOTE — Progress Notes (Signed)
Initial Nutrition Assessment  DOCUMENTATION CODES:  Severe malnutrition in context of acute illness/injury, Obesity unspecified  INTERVENTION:  Ensure Enlive po BID, each supplement provides 350 kcal and 20 grams of protein  30 ml prostat BID, each 30 mls provides 100 kcal, 15 gram Pro  NUTRITION DIAGNOSIS:  Inadequate oral intake related to poor appetite as evidenced by loss of 15% bw in 2 months, energy intake < or equal to 50% for > or equal to 5 days.  GOAL:  Patient will meet greater than or equal to 90% of their needs  MONITOR:  PO intake, Supplement acceptance, Diet advancement, Weight trends, Labs, I & O's  REASON FOR ASSESSMENT:  Malnutrition Screening Tool    ASSESSMENT:  54 y.o. male PMHx DM2, HTN, BPH, diabetic foot ulcer s/p L BKA, GERD, atrial flutter and chronic back pain. Presents with fever, dysuria and feeling sick. Admitted/being treated for urosepsis.   Pt reports that for approximately 1 month he has had a very poor appetite due to lack of appetite and taste changes.   He says he would usually not even eat a single meal each day; he would just snack here and there. He reports used to really enjoying vegetables, but recently they dont taste right so he doesn't eat them. He supplemented his poor meal intake with 2 Ensure supplements daily. He says he followed a low Fat/Carb Controlled Diet. He took a b12 supplement.   He has intermittent n/v during that time. Nothing ongoing. He does not have regular bowel movements, but that is normal for him. No diarrhea reported.   Pt states his "normal" weight used to be 350. He says he lost weight over the past few years due to decreased appetite. More recently, his normal wt was ~315. He had a BKA on 9/3 which is estimated to account for ~20 lbs of the 45 he has lost in the past 2 months. This would indicate loss of 15% bw not including wt lost from BKA  He says he "just wishes for his appetite to return". He had tried the  [employer], but states it made him throw up. Agreeable to EE BID like at home. He is a very large individual and would benefit from the extra kcals/pro the ensure provides vs glucerna. His CBGs were very low last night. If his CBGs elevate, may need to be changed to glucerna.    Diet Order:  Diet heart healthy/carb modified Room service appropriate?: Yes; Fluid consistency:: Thin  Skin:  Reviewed, no issues  Last BM:  10/27  Height:  Ht Readings from Last 1 Encounters:  02/25/15 6\' 8"  (2.032 m)   Weight:  Wt Readings from Last 1 Encounters:  02/25/15 270 lb 12.8 oz (122.834 kg)  Adjusted BW:  289 lbs or 131.37 kg  Wt Readings from Last 10 Encounters:  02/25/15 270 lb 12.8 oz (122.834 kg)  01/01/15 315 lb 14.7 oz (143.3 kg)  12/23/14 316 lb (143.337 kg)  12/19/14 334 lb 3.5 oz (151.6 kg)  12/16/14 321 lb (145.605 kg)  11/10/14 321 lb (145.605 kg)  11/09/14 323 lb (146.512 kg)  09/07/14 313 lb 7 oz (142.174 kg)  08/24/14 315 lb (142.883 kg)  08/05/14 328 lb (148.78 kg)   Ideal Body Weight:  96.05 kg  BMI:  Body mass using adjusted BW 31.8 kg/(m^2).  Estimated Nutritional Needs:  Kcal:  4854-6270 (18-20 kcal/kg ABW) Protein:  115-135 (1.2-1.4 g/kg IBW) Fluid:  2.1-2.3 liters  EDUCATION NEEDS:  No education  needs identified at this time  Burtis Junes RD, LDN Nutrition Pager: [phone] 02/25/2015 11:05 AM

## 2015-02-25 NOTE — Progress Notes (Signed)
CRITICAL VALUE ALERT  Critical value received:  Lactic Acid 2.4  Date of notification:  02/25/2015  Time of notification:  04:17  Critical value read back:Yes.    Nurse who received alert:  Villa Herb Jestine Bicknell  MD notified (1st page):  Georges Lynch  Time of first page:  04:20  MD notified (2nd page):  Time of second page:  Responding MD:  Georges Lynch  Time MD responded:  04:36

## 2015-02-25 NOTE — Progress Notes (Signed)
Family Medicine Teaching Service Daily Progress Note Intern Pager: [phone]  Patient name: Ray Hall Medical record number: [id number] Date of birth: [birth year] Age: 54 y.o. Gender: male  Primary Care Provider: Tawanna Sat, MD Consultants: none Code Status: full  Pt Overview and Major Events to Date:  10/28: patient admitted for UTI/Sepsis  Assessment and Plan: ALECSANDER HATTABAUGH is a 55 y.o. male presenting with fever, dysuria and feeling sick. PMH is significant for type 2 DM, HTN, BPH, diabetic foot ulcer, GERD, atrial flutter and chronic back pain.   Sepsis, 2/2 UTI: Hypotensive and tachycardic on presentation. UA significant for many bacteria and leukocytosis.  His presentation with dysuria, fever, chill and rigors suggestive for pyelonephritis but he has no CVA tenderness.  -Zosyn for broad coverage. Plans to narrow after urine cx -Consider further imaging for possible Pylo >> likely wouldn't change treatment course.  -follow up blood cultures -follow up urine culture -Tylenol for fever -Continue I&O cath. -Monitor vital signs  Non anginal Chest pain and shortness of breath: EKG with NSR and unchanged from prior. Troponin negative x1. Asymptomatic since admission. CXR clear. He is breathing comfortably on room air. PE unlikely with wells score of 1.5. Likely GERD related. -Monitor respiratory status -Telemetry  AKI: Cr 2.29 on admission (baseline 0.94). Now Cr 1.81  -S/p 2L total of NS -NS @150ml /hrs -UOP good but turbid  HTN: hypotensive on presenation. Improving. - Hold home lisinopril  - continue to monitor - continue IVF as above  DM2: Last A1c 7.4 8/19, on glipizide, metformin at home.  -Hold home meds -SSI -CBG ACHS  GERD:  - May hold of PPI in the setting of AKI - Pepcid 20mg  QD  HLD: -Atorvastatin 40 mg dialy   FEN/GI:  -heart healthy diet -GI cocktail - SQ Hep  Disposition: pending medical improvement  Subjective:  Patient states he  feels better this morning. Still not himself. No longer has lightheaded feeling when sitting up. No other issues.  Objective: Temp:  [97.7 F (36.5 C)-99.1 F (37.3 C)] 99.1 F (37.3 C) (10/29 0338) Pulse Rate:  [98-144] 113 (10/29 0338) Resp:  [13-22] 16 (10/29 0338) BP: (87-125)/(56-94) 96/59 mmHg (10/29 0338) SpO2:  [98 %-100 %] 100 % (10/29 0338) Weight:  [270 lb 12.8 oz (122.834 kg)] 270 lb 12.8 oz (122.834 kg) (10/29 9371) Physical Exam: Gen: well-appearing  CV: RRR. S1 & S2 audible, no murmurs. 1+ radial pulse; Cap refill ~2sec bilaterally (improved) Resp: no apparent WOB, CTAB, good aeration bilaterally Abd: +BS. Soft, NDNT, no rebound or guarding, no CVA tenderness Ext: No edema, healing BKA on the left, dry scaly skin Neuro: Alert and oriented, No gross focal deficits  Laboratory:  Recent Labs Lab 02/24/15 1556 02/25/15 0300  WBC 10.5 13.5*  HGB 8.6* 7.6*  HCT 26.5* 23.0*  PLT 708* 605*    Recent Labs Lab 02/24/15 1556 02/25/15 0300  NA 130* 136  K 5.1 5.3*  CL 99* 103  CO2 19* 20*  BUN 56* 50*  CREATININE 2.29* 1.81*  CALCIUM 9.5 9.0  GLUCOSE 167* 161*   Lactic Acid: 3.43 >> 2.4 >> 2.8  Imaging/Diagnostic Tests: 10/28 CXR IMPRESSION: No active cardiopulmonary disease.  Elberta Leatherwood, MD 02/25/2015, 11:45 AM PGY-2, [place] Intern pager: [phone], text pages welcome

## 2015-02-26 DIAGNOSIS — I10 Essential (primary) hypertension: Secondary | ICD-10-CM

## 2015-02-26 DIAGNOSIS — D509 Iron deficiency anemia, unspecified: Secondary | ICD-10-CM

## 2015-02-26 DIAGNOSIS — Z87440 Personal history of urinary (tract) infections: Secondary | ICD-10-CM

## 2015-02-26 DIAGNOSIS — T8351XD Infection and inflammatory reaction due to indwelling urinary catheter, subsequent encounter: Secondary | ICD-10-CM

## 2015-02-26 DIAGNOSIS — E1142 Type 2 diabetes mellitus with diabetic polyneuropathy: Secondary | ICD-10-CM

## 2015-02-26 DIAGNOSIS — Z794 Long term (current) use of insulin: Secondary | ICD-10-CM

## 2015-02-26 LAB — BASIC METABOLIC PANEL
ANION GAP: 11 (ref 5–15)
BUN: 32 mg/dL — ABNORMAL HIGH (ref 6–20)
CALCIUM: 8.7 mg/dL — AB (ref 8.9–10.3)
CO2: 18 mmol/L — ABNORMAL LOW (ref 22–32)
CREATININE: 1.4 mg/dL — AB (ref 0.61–1.24)
Chloride: 107 mmol/L (ref 101–111)
GFR calc non Af Amer: 57 mL/min — ABNORMAL LOW (ref >60)
Glucose, Bld: 192 mg/dL — ABNORMAL HIGH (ref 65–99)
Potassium: 5 mmol/L (ref 3.5–5.1)
Sodium: 136 mmol/L (ref 135–145)

## 2015-02-26 LAB — GLUCOSE, CAPILLARY
GLUCOSE-CAPILLARY: 141 mg/dL — AB (ref 65–99)
GLUCOSE-CAPILLARY: 121 mg/dL — AB (ref 65–99)
Glucose-Capillary: 157 mg/dL — ABNORMAL HIGH (ref 65–99)

## 2015-02-26 LAB — PREPARE RBC (CROSSMATCH)

## 2015-02-26 LAB — HEMOGLOBIN AND HEMATOCRIT, BLOOD
HEMATOCRIT: 21.2 % — AB (ref 39.0–52.0)
Hemoglobin: 7 g/dL — ABNORMAL LOW (ref 13.0–17.0)

## 2015-02-26 LAB — RETICULOCYTES
RBC.: 2.84 MIL/uL — AB (ref 4.22–5.81)
RETIC CT PCT: 1.6 % (ref 0.4–3.1)
Retic Count, Absolute: 45.4 10*3/uL (ref 19.0–186.0)

## 2015-02-26 LAB — CBC
HEMATOCRIT: 19.1 % — AB (ref 39.0–52.0)
Hemoglobin: 6.2 g/dL — CL (ref 13.0–17.0)
MCH: 22.3 pg — ABNORMAL LOW (ref 26.0–34.0)
MCHC: 32.5 g/dL (ref 30.0–36.0)
MCV: 68.7 fL — AB (ref 78.0–100.0)
PLATELETS: 431 10*3/uL — AB (ref 150–400)
RBC: 2.78 MIL/uL — ABNORMAL LOW (ref 4.22–5.81)
RDW: 19.7 % — AB (ref 11.5–15.5)
WBC: 18.7 10*3/uL — AB (ref 4.0–10.5)

## 2015-02-26 LAB — IRON AND TIBC
Iron: 8 ug/dL — ABNORMAL LOW (ref 45–182)
Saturation Ratios: 5 % — ABNORMAL LOW (ref 17.9–39.5)
TIBC: 169 ug/dL — ABNORMAL LOW (ref 250–450)
UIBC: 161 ug/dL

## 2015-02-26 LAB — URINE CULTURE

## 2015-02-26 LAB — FERRITIN: Ferritin: 599 ng/mL — ABNORMAL HIGH (ref 24–336)

## 2015-02-26 LAB — LACTIC ACID, PLASMA: LACTIC ACID, VENOUS: 2.1 mmol/L — AB (ref 0.5–2.0)

## 2015-02-26 MED ORDER — SENNA 8.6 MG PO TABS: 1.0000 | ORAL_TABLET | Freq: Every day | ORAL | Status: DC

## 2015-02-26 MED ORDER — SODIUM CHLORIDE 0.9 % IV SOLN: Freq: Once | INTRAVENOUS | Status: AC

## 2015-02-26 MED ORDER — LORAZEPAM 0.5 MG PO TABS
0.5000 mg | ORAL_TABLET | Freq: Once | ORAL | Status: AC
  Administered 2015-02-26: 0.5 mg via ORAL
  Filled 2015-02-26: qty 1

## 2015-02-26 NOTE — Progress Notes (Signed)
Family Medicine Teaching Service Daily Progress Note Intern Pager: [phone]  Patient name: Ray Hall Medical record number: [id number] Date of birth: Aug 23, 1960 Age: 54 y.o. Gender: male  Primary Care Provider: Tawanna Sat, MD Consultants: none Code Status: full  Pt Overview and Major Events to Date:  10/28: patient admitted for UTI/Sepsis  Assessment and Plan: 54 y.o. male presenting with fever, dysuria and feeling sick. PMH is significant for type 2 DM, HTN, BPH, diabetic foot ulcer, GERD, atrial flutter and chronic back pain.   # Sepsis, 2/2 UTI: Hypotensive and tachycardic on presentation. UA significant for many bacteria and leukocytosis.  His presentation with dysuria, fever, chill and rigors suggestive for pyelonephritis but he has no CVA tenderness. No documented fevers during hospitalization. - Zosyn for broad coverage. Consider transition to oral antibiotic tomorrow if stable. - Urine and blood culture with >100,000 colonies of Gram negative rods with Klebsiella speciated. Resistance to Ampicillin and Nitrofurantoin noted.   - Lactic acid improving 2.1 today down from 2.8 yesterday - WBC elevated to 18.9, up from 13.5 at last check -Consider further imaging for possible Pyelonephritis or abscess if fevers do not resolve -Tylenol for fever -Continue I&O cath. Refuses foley. I&O caths self at baseline. -Monitor vital signs  # AKI: Cr 2.29 on admission (baseline 0.94).  - Creatinine 1.4 today, improved from 1.81 yesterday -S/p 2L total of NS. Currently on NS @150ml /hrs. Nurse reports has been eating and drinking much better, so will KVO fluids at this time and monitor. Monitor BP off of fluids. Low threshold for restarting. - Continue to monitor  # Anemia: Suspect Iron Deficiency + Anemia of Chronic Disease - Hemoglobin 6.8. Baseline ~9 - Recheck CBC at 1200. If hemoglobin remains below 7, consider transfusion. - Low Iron at 8, elevated Ferritin at 599, low TIBC at  169. Holding iron at this time given infection. - FOBT - Will need order for PT, but will hold for now until hemoglobin more stable  # HTN: hypotensive on presentation. Improving. - Stable - Hold home lisinopril  - Continue to monitor  # DM2: Last A1c 7.4 on 12/16/14, on glipizide and metformin at home.  - Hold home meds - Sensitive SSI - CBG ACHS  # FEN/GI:  - Heart healthy diet - Fluids KVO -GI cocktail - SQ Hep  Disposition: pending medical improvement  Subjective:  Feels better today. Still weak. No bowel movements since admission. Denies pain. No further concerns.  Objective: Temp:  [98.6 F (37 C)-100.3 F (37.9 C)] 99.5 F (37.5 C) (10/30 0628) Pulse Rate:  [104-125] 106 (10/30 0628) Resp:  [16-18] 16 (10/30 0628) BP: (102-132)/(60-72) 132/72 mmHg (10/30 0628) SpO2:  [97 %-99 %] 97 % (10/30 7026) Physical Exam: Gen: 54yo male in no apparent distress CV: RRR. S1 & S2 audible, no murmurs.  Resp: no apparent WOB, CTAB, good aeration bilaterally Abd: +BS. 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  [address] DO 02/26/2015, 8:22 AM PGY-2, [place] Intern pager: [phone], text pages welcome

## 2015-02-26 NOTE — Progress Notes (Signed)
CBC with significant anemia, 6.2 down from 6.8 this morning. Continues to note weakness. Will transfuse two units of blood. Leukocytosis stable.  Dr. Gerlean Ren

## 2015-02-26 NOTE — Progress Notes (Signed)
Lab called with Lactic Acid critical value of 2.1. This value is consistent with patient's prior values and MD is already aware based on notes in chart.

## 2015-02-26 NOTE — Progress Notes (Signed)
Notified Family Medicine Teaching Service about Critical Hgb report of 6.8.  Pt still receiving NS at 150 ml/hr.  No visible bleeding.  No BM reported.  Pt has been refusing heparin.  Will continue to monitor.

## 2015-02-27 ENCOUNTER — Inpatient Hospital Stay (HOSPITAL_COMMUNITY): Payer: Medicaid Other

## 2015-02-27 DIAGNOSIS — R0789 Other chest pain: Secondary | ICD-10-CM

## 2015-02-27 LAB — BASIC METABOLIC PANEL
ANION GAP: 8 (ref 5–15)
BUN: 21 mg/dL — AB (ref 6–20)
CHLORIDE: 110 mmol/L (ref 101–111)
CO2: 19 mmol/L — ABNORMAL LOW (ref 22–32)
Calcium: 8.7 mg/dL — ABNORMAL LOW (ref 8.9–10.3)
Creatinine, Ser: 1.14 mg/dL (ref 0.61–1.24)
GFR calc Af Amer: >60 mL/min (ref >60)
Glucose, Bld: 104 mg/dL — ABNORMAL HIGH (ref 65–99)
POTASSIUM: 4.5 mmol/L (ref 3.5–5.1)
SODIUM: 137 mmol/L (ref 135–145)

## 2015-02-27 LAB — GLUCOSE, CAPILLARY
GLUCOSE-CAPILLARY: 174 mg/dL — AB (ref 65–99)
GLUCOSE-CAPILLARY: 109 mg/dL — AB (ref 65–99)
GLUCOSE-CAPILLARY: 287 mg/dL — AB (ref 65–99)
Glucose-Capillary: 147 mg/dL — ABNORMAL HIGH (ref 65–99)
Glucose-Capillary: 124 mg/dL — ABNORMAL HIGH (ref 65–99)
Glucose-Capillary: 169 mg/dL — ABNORMAL HIGH (ref 65–99)

## 2015-02-27 LAB — CBC
HCT: 21 % — ABNORMAL LOW (ref 39.0–52.0)
HCT: 19.1 % — ABNORMAL LOW (ref 39.0–52.0)
HEMOGLOBIN: 6.8 g/dL — AB (ref 13.0–17.0)
HEMOGLOBIN: 6.6 g/dL — AB (ref 13.0–17.0)
MCH: 22.4 pg — AB (ref 26.0–34.0)
MCH: 23.9 pg — AB (ref 26.0–34.0)
MCHC: 32.4 g/dL (ref 30.0–36.0)
MCHC: 34.6 g/dL (ref 30.0–36.0)
MCV: 69.3 fL — AB (ref 78.0–100.0)
MCV: 69.2 fL — AB (ref 78.0–100.0)
PLATELETS: 435 10*3/uL — AB (ref 150–400)
PLATELETS: 372 10*3/uL (ref 150–400)
RBC: 3.03 MIL/uL — ABNORMAL LOW (ref 4.22–5.81)
RBC: 2.76 MIL/uL — AB (ref 4.22–5.81)
RDW: 19.7 % — ABNORMAL HIGH (ref 11.5–15.5)
RDW: 20 % — ABNORMAL HIGH (ref 11.5–15.5)
WBC: 18.9 10*3/uL — ABNORMAL HIGH (ref 4.0–10.5)
WBC: 15.4 10*3/uL — AB (ref 4.0–10.5)

## 2015-02-27 LAB — HEMOGLOBIN AND HEMATOCRIT, BLOOD
HEMATOCRIT: 33 % — AB (ref 39.0–52.0)
Hemoglobin: 11 g/dL — ABNORMAL LOW (ref 13.0–17.0)

## 2015-02-27 LAB — CULTURE, BLOOD (ROUTINE X 2)

## 2015-02-27 LAB — PREPARE RBC (CROSSMATCH)

## 2015-02-27 LAB — HEMOGLOBIN A1C
HEMOGLOBIN A1C: 6.5 % — AB (ref 4.8–5.6)
MEAN PLASMA GLUCOSE: 140 mg/dL

## 2015-02-27 LAB — LACTIC ACID, PLASMA: Lactic Acid, Venous: 1.2 mmol/L (ref 0.5–2.0)

## 2015-02-27 LAB — LACTATE DEHYDROGENASE: LDH: 242 U/L — AB (ref 98–192)

## 2015-02-27 LAB — SAVE SMEAR

## 2015-02-27 MED ORDER — DIPHENHYDRAMINE HCL 25 MG PO CAPS
25.0000 mg | ORAL_CAPSULE | Freq: Once | ORAL | Status: AC
  Administered 2015-02-27: 25 mg via ORAL
  Filled 2015-02-27 (×2): qty 1

## 2015-02-27 MED ORDER — INSULIN ASPART 100 UNIT/ML ~~LOC~~ SOLN: 0.0000 [IU] | Freq: Three times a day (TID) | SUBCUTANEOUS | Status: DC

## 2015-02-27 MED ORDER — SODIUM CHLORIDE 0.9 % IV SOLN
1000.0000 mg | Freq: Once | INTRAVENOUS | Status: DC
  Filled 2015-02-27: qty 20

## 2015-02-27 MED ORDER — METHYLPREDNISOLONE SODIUM SUCC 125 MG IJ SOLR
125.0000 mg | Freq: Once | INTRAMUSCULAR | Status: AC
  Administered 2015-02-27: 125 mg via INTRAVENOUS

## 2015-02-27 MED ORDER — IRON DEXTRAN 50 MG/ML IJ SOLN
25.0000 mg | Freq: Once | INTRAMUSCULAR | Status: AC
Start: 2015-02-27 — End: 2015-02-27
  Administered 2015-02-27: 25 mg via INTRAVENOUS
  Filled 2015-02-27: qty 0.5

## 2015-02-27 MED ORDER — INSULIN ASPART 100 UNIT/ML ~~LOC~~ SOLN
0.0000 [IU] | Freq: Every day | SUBCUTANEOUS | Status: DC
  Administered 2015-02-27: 3 [IU] via SUBCUTANEOUS
  Administered 2015-02-28: 2 [IU] via SUBCUTANEOUS

## 2015-02-27 MED ORDER — CEFTRIAXONE SODIUM 2 G IJ SOLR
2.0000 g | INTRAMUSCULAR | Status: DC
  Administered 2015-02-27: 2 g via INTRAVENOUS
  Filled 2015-02-27 (×2): qty 2

## 2015-02-27 MED ORDER — SODIUM CHLORIDE 0.9 % IV SOLN
Freq: Once | INTRAVENOUS | Status: AC
  Administered 2015-02-27: 10:00:00 via INTRAVENOUS

## 2015-02-27 MED ORDER — CEPHALEXIN 500 MG PO CAPS
500.0000 mg | ORAL_CAPSULE | Freq: Four times a day (QID) | ORAL | Status: DC
  Administered 2015-02-27: 500 mg via ORAL
  Filled 2015-02-27: qty 1

## 2015-02-27 MED ORDER — EPINEPHRINE HCL 0.1 MG/ML IJ SOSY
1.0000 mg | PREFILLED_SYRINGE | Freq: Once | INTRAMUSCULAR | Status: AC
  Administered 2015-02-27: 1 mg via INTRAVENOUS

## 2015-02-27 MED ORDER — DIPHENHYDRAMINE HCL 50 MG/ML IJ SOLN
INTRAMUSCULAR | Status: AC
  Administered 2015-02-27: 50 mg
  Filled 2015-02-27: qty 1

## 2015-02-27 MED ORDER — POLYETHYLENE GLYCOL 3350 17 G PO PACK
17.0000 g | PACK | Freq: Every day | ORAL | Status: DC
  Filled 2015-02-27 (×2): qty 1

## 2015-02-27 MED ORDER — ACETAMINOPHEN 325 MG PO TABS
650.0000 mg | ORAL_TABLET | Freq: Once | ORAL | Status: AC
  Administered 2015-02-27: 650 mg via ORAL
  Filled 2015-02-27 (×2): qty 2

## 2015-02-27 MED FILL — Medication: Qty: 1 | Status: AC

## 2015-02-27 NOTE — Progress Notes (Signed)
Called to bedside as pt. Began to have throat swelling and shortness of breath with test dose of IV iron. Iron was stopped immediately. He was complaining of difficulty breathing. Upon my arrival, the rapid response nurse was present. Vitals as below. He as complaining of throat swelling.   - Given Benadryl Injection 25 mg x 2 - Solumedrol - 125mg   - Epinephrine - 5mg    With the above medication administrations he began to feel better. He was hypoxic to 81% initially, but subsequently came up with Henning administration as he did not tolerate attempted Bipap or Nonrebreather on his face. His throat swelling and SOB improved over the course of about 30 minutes. EKG was obtained as he complained of chest pain with initial Epi administration and SVT was noted on monitor, however this resolved as the epi wore off and his heart rate returned to baseline.   Filed Vitals:   02/27/15 1539  BP: 130/75  Pulse: 130  Temp:   Resp: 22   CV: Tachycardic, no new murmurs, gallops or rubs Resp: Slight crackles, tachypneic, but no stridor, and good air movement through the upper airways noted.  Pulse: 2+ and tachycardic. Noted throughout the event.   A/P: Give medications as above. Will continue to monitor overnight. No further iron at this time.  - Discussed with Attending Dr. Andria Frames who arrived near the end of this episode.

## 2015-02-27 NOTE — Progress Notes (Signed)
   02/27/15 1500  Clinical Encounter Type  Visited With Family  Visit Type Spiritual support  Referral From Nurse  Spiritual Encounters  Spiritual Needs Emotional  Stress Factors  Patient Stress Factors Health changes  Family Stress Factors Health changes;Loss of control;Lack of knowledge  Received request for prayer, but wife really needed simple company as she regained her calm after an allergic reaction her husband experienced. Arranged for doctor to come update her; she seemed calm and her sister was on the way, so I left with instructions for her to call if she needed me.

## 2015-02-27 NOTE — Progress Notes (Signed)
CRITICAL VALUE ALERT  Critical value received:  Hgb 6.6  Date of notification:  02/27/15  Time of notification: 03:54  Critical value read back:Yes.    Nurse who received alert: Griffin Dakin   MD notified (1st page):  Night Intern Family Practice -Dr. Emmaline Life  Time of first page: 03:56  MD notified (2nd page):  Time of second page:  Responding MD:  Night Family Practice Intern- Dr. Emmaline Life   Time MD responded:  04:08  MD stated will review for possible transfusion later in morning.

## 2015-02-27 NOTE — Significant Event (Signed)
Rapid Response Event Note  Overview: Time Called: 6256 Arrival Time: 1430 Event Type: Respiratory, Cardiac, Hypotension  Initial Focused Assessment: Patient status post iron test dose infusion.  Now with altered mental status, wheezing, crackles through out, labored breathing. Hypotensive.  BP 70/44  HR 90  RR 28  Patient in acute distress Patient incontinent of urine Dr Minda Ditto quickly at bedside Family at bedside   Interventions: Placed on additional O2 25mg  Benadryl given IV 1 amp Epi given IV 125mg  Solumedrol given IV Patient complaining he cant breath. O2 sat 96% on 6L  Intolerant of NRB mask or Bipap HR increased to 140s, pt complaining of chest pain, shivering  Lung sounds starting to clear then sounded wet and wheezing again 2 nd dose of 25mg  Benadryl given IV 12 lead EKG done Patient improving able to breath easier, less distress.  Remains tachycardic and shivering. Linen changed, Dry covers. Patient more conversant and states he feels better. Family at bedside RN to call if assistance needed.    Patient starting to be able to breath easier Patient starting to improve  Event Summary: Name of Physician Notified: Paula Compton, resident   -  Dr Andria Frames Attending  at 1430    at    Outcome: Stayed in room and stabalized  Event End Time: [place]  Raliegh Ip

## 2015-02-27 NOTE — Progress Notes (Addendum)
Pt CBG 287 this evening and no night time coverage. Paged Resident for further orders.   Will continue to monitor.   Earlie Lou  [phone] Resident paged again and orders received.

## 2015-02-27 NOTE — Progress Notes (Signed)
ANTIBIOTIC CONSULT NOTE - FOLLOW UP  Pharmacy Consult for Zosyn Indication: GN bacteremia & Klebsiella UTI  Allergies  Allergen Reactions  . Lactose Intolerance (Gi) Other (See Comments)    Gas & heartburn  Patient Measurements: Height: 6\' 8"  (203.2 cm) Weight: 270 lb 12.8 oz (122.834 kg) IBW/kg (Calculated) : 96 Vital Signs: Temp: 98.8 F (37.1 C) (10/31 0533) Temp Source: Oral (10/31 0533) BP: 109/60 mmHg (10/31 0533) Pulse Rate: 87 (10/31 0533) Intake/Output from previous day: 10/30 0701 - 10/31 0700 In: 1045 [P.O.:720; Blood:325] Out: 2675 [Urine:2675] Intake/Output from this shift: Total I/O In: -  Out: 100 [Urine:100] Labs:  Recent Labs  02/25/15 0300 02/26/15 0932 02/26/15 1211 02/26/15 2055 02/27/15 0218  WBC 13.5* 18.9* 18.7*  --  15.4*  HGB 7.6* 6.8* 6.2* 7.0* 6.6*  PLT 605* 435* 431*  --  372  CREATININE 1.81* 1.40*  --   --  1.14   Estimated Creatinine Clearance: 115.7 mL/min (by C-G formula based on Cr of 1.14).  Microbiology: Recent Results (from the past 720 hour(s))  Urine culture     Status: None   Collection Time: 02/24/15  5:10 PM  Result Value Ref Range Status   Specimen Description URINE, RANDOM  Final   Special Requests ADDON [id number]  Final   Culture >=100,000 COLONIES/mL KLEBSIELLA PNEUMONIAE  Final   Report Status 02/26/2015 FINAL  Final   Organism ID, Bacteria KLEBSIELLA PNEUMONIAE  Final      Susceptibility   Klebsiella pneumoniae - MIC*    AMPICILLIN >=32 RESISTANT Resistant     CEFAZOLIN <=4 SENSITIVE Sensitive     CEFTRIAXONE <=1 SENSITIVE Sensitive     CIPROFLOXACIN <=0.25 SENSITIVE Sensitive     GENTAMICIN <=1 SENSITIVE Sensitive     IMIPENEM <=0.25 SENSITIVE Sensitive     NITROFURANTOIN 128 RESISTANT Resistant     TRIMETH/SULFA <=20 SENSITIVE Sensitive     AMPICILLIN/SULBACTAM 4 SENSITIVE Sensitive     PIP/TAZO <=4 SENSITIVE Sensitive     * >=100,000 COLONIES/mL KLEBSIELLA PNEUMONIAE  Culture, blood (routine x 2)      Status: None (Preliminary result)   Collection Time: 02/24/15  9:25 PM  Result Value Ref Range Status   Specimen Description BLOOD LEFT ARM  Final   Special Requests BOTTLES DRAWN AEROBIC AND ANAEROBIC 10CC  Final   Culture  Setup Time   Final    GRAM NEGATIVE RODS IN BOTH AEROBIC AND ANAEROBIC BOTTLES CRITICAL RESULT CALLED TO, READ BACK BY AND VERIFIED WITH: B BLAKE,RN AT 1027 02/25/15 BY L BENFIELD    Culture GRAM NEGATIVE RODS  Final   Report Status PENDING  Incomplete  Culture, blood (routine x 2)     Status: None (Preliminary result)   Collection Time: 02/24/15  9:29 PM  Result Value Ref Range Status   Specimen Description BLOOD RIGHT FOREARM  Final   Special Requests BOTTLES DRAWN AEROBIC ONLY 5CC  Final   Culture  Setup Time   Final    GRAM NEGATIVE RODS AEROBIC BOTTLE ONLY CRITICAL RESULT CALLED TO, READ BACK BY AND VERIFIED WITH: Armando Gang AT 1044 02/25/15 BY L BENFIELD    Culture GRAM NEGATIVE RODS  Final   Report Status PENDING  Incomplete    Anti-infectives    Start     Dose/Rate Route Frequency Ordered Stop   02/25/15 0400  piperacillin-tazobactam (ZOSYN) IVPB 3.375 g     3.375 g 12.5 mL/hr over 240 Minutes Intravenous Every 8 hours 02/24/15 2116  02/24/15 2200  cefTAZidime (FORTAZ) 1 g in dextrose 5 % 50 mL IVPB  Status:  Discontinued     1 g 100 mL/hr over 30 Minutes Intravenous 3 times per day 02/24/15 1955 02/24/15 2048   02/24/15 2115  piperacillin-tazobactam (ZOSYN) IVPB 3.375 g  Status:  Discontinued     3.375 g 100 mL/hr over 30 Minutes Intravenous  Once 02/24/15 2114 02/27/15 0814   02/24/15 2000  ciprofloxacin (CIPRO) IVPB 400 mg  Status:  Discontinued     400 mg 200 mL/hr over 60 Minutes Intravenous Every 12 hours 02/24/15 1955 02/24/15 2048      Assessment: 54 year old male on day #4 of Zosyn for GN sepsis. Urine culture is positive for Klebsiella (resistant to ampicillin and nitrofurantoin, but sensitive to all others). Blood cultures are  growing gram negative rods but specificity and sensitivities are still pending. WBC remains elevated but trending down. Patient remains afebrile. SCr is improving.   Goal of Therapy:  Clinical resolution of infection  Plan:  Continue Zosyn 3.375g IV every 8 hours - 4 hr infusion for each dose.  Follow-up blood culture results and narrow therapy as able.  Monitor renal function and clinical status.   MD- Please consider RLE SCD without pharmacologic prophylaxis.   Thank you for this consult,   Sloan Leiter, PharmD, BCPS Clinical Pharmacist [phone] 02/27/2015,8:16 AM

## 2015-02-27 NOTE — Consult Note (Signed)
Subjective:   HPI  The patient is a 54 year old male who is being treated for urinary tract sepsis. We are asked to see him in regards to iron deficiency anemia. At a low point his hemoglobin dropped to 6.2 here in the hospital. He has not noticed any signs of whatsoever of gastrointestinal bleeding from either the upper or lower GI tract. He has received blood transfusions. His iron studies are consistent with iron deficiency. In reviewing notes he had been anemic before and it was recommended to him to be referred for a colonoscopy this past summer but he declined referral. Patient does report constipation and states he can go for days at a time without a bowel movement. His stools have not been checked for occult blood here because he hasn't had a bowel movement. He states he can go for a week at a time without a bowel movement. His last bowel movement was about 4 days ago.  Review of Systems No chest pain or shortness of breath  Past Medical History  Diagnosis Date  . Sickle cell trait ([place])   . Normal nuclear stress test 07/2010    low prob of ischemia, EF 42%  . Echocardiogram abnormal     moderate LVH, mild LV hypokenesis, EF 42%  . History of Doppler ultrasound 07/2010    negative for DVT  . Abnormal CT of the abdomen     mild L hydronephrosis and hydroureter  . Status post radiofrequency ablation for arrhythmia 10/16/10    [facility] MD)  . Diabetes mellitus   . Hypertension   . Osteomyelitis ([place]) 12/2014    LEFT FOOT  . Microcytic anemia 02/25/2015  . 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 Followed at [facility].    Past Surgical History  Procedure Laterality Date  . Radiofrequency ablation  10/16/10    Cardiac for atrial arrythmia, [facility]  . Dental extractions  10/15/10    prior to ablation  . Patella fracture surgery      metal rod left leg  . Tonsillectomy    . Rotator cuff repair Left 10/2014  . Amputation Left 12/31/2014     Procedure: AMPUTATION BELOW KNEE;  Surgeon: Newt Minion, MD;  Location: [place];  Service: Orthopedics;  Laterality: Left;   Social History   Social History  . Marital Status: Married    Spouse Name: N/A  . Number of Children: N/A  . Years of Education: N/A   Occupational History  . Not on file.   Social History Main Topics  . Smoking status: Former Smoker -- 1.50 packs/day for 17 years    Types: Cigars    Quit date: 12/15/2014  . Smokeless tobacco: Never Used     Comment: 2 cigars  . Alcohol Use: No  . Drug Use: No  . Sexual Activity: Not on file   Other Topics Concern  . Not on file   Social History Narrative   family history includes Diabetes in his father and mother; Heart failure in his father; Hypertension in his mother; Sickle cell trait in an other family member.  Current facility-administered medications:  .  0.9 %  sodium chloride infusion, , Intravenous, Continuous, [place] N Rumley, DO, Last Rate: 150 mL/hr at 02/26/15 1431 .  acetaminophen (TYLENOL) tablet 650 mg, 650 mg, Oral, Q6H PRN, 650 mg at 02/26/15 1958 **OR** acetaminophen (TYLENOL) suppository 650 mg, 650 mg, Rectal, Q6H PRN, Elberta Leatherwood, MD .  atorvastatin (  LIPITOR) tablet 40 mg, 40 mg, Oral, q1800, Elberta Leatherwood, MD, 40 mg at 02/26/15 1800 .  cefTRIAXone (ROCEPHIN) 2 g in dextrose 5 % 50 mL IVPB, 2 g, Intravenous, Q24H, Mercy Riding, MD .  famotidine (PEPCID) tablet 20 mg, 20 mg, Oral, Daily, Elberta Leatherwood, MD, 20 mg at 02/27/15 1123 .  feeding supplement (ENSURE ENLIVE) (ENSURE ENLIVE) liquid 237 mL, 237 mL, Oral, BID BM, Oswaldo Milian, RD, 237 mL at 02/26/15 1400 .  feeding supplement (PRO-STAT SUGAR FREE 64) liquid 60 mL, 60 mL, Oral, BID, Oswaldo Milian, RD, 60 mL at 02/27/15 1122 .  gabapentin (NEURONTIN) capsule 100 mg, 100 mg, Oral, BID, Elberta Leatherwood, MD, 100 mg at 02/27/15 1122 .  insulin aspart (novoLOG) injection 0-9 Units, 0-9 Units, Subcutaneous, TID WC, Elberta Leatherwood, MD, 1 Units at  02/27/15 1222 .  iron dextran complex (INFED) 25 mg in sodium chloride 0.9 % 50 mL IVPB, 25 mg, Intravenous, Once **FOLLOWED BY** iron dextran complex (INFED) 1,000 mg in sodium chloride 0.9 % 500 mL IVPB, 1,000 mg, Intravenous, Once, Asiyah Cletis Media, MD .  ondansetron (ZOFRAN) tablet 4 mg, 4 mg, Oral, Q6H PRN **OR** ondansetron (ZOFRAN) injection 4 mg, 4 mg, Intravenous, Q6H PRN, Elberta Leatherwood, MD, 4 mg at 02/25/15 0131 .  polyethylene glycol (MIRALAX / GLYCOLAX) packet 17 g, 17 g, Oral, Daily, Asiyah Cletis Media, MD, 17 g at 02/27/15 1125 Allergies  Allergen Reactions  . Lactose Intolerance (Gi) Other (See Comments)    Gas & heartburn     Objective:     BP 132/78 mmHg  Pulse 76  Temp(Src) 97.8 F (36.6 C) (Oral)  Resp 20  Ht 6\' 8"  (2.032 m)  Wt 122.834 kg (270 lb 12.8 oz)  BMI 29.75 kg/m2  SpO2 100%  He is in no distress  Nonicteric  Heart regular rhythm no murmurs  Lungs clear  Abdomen: Bowel sounds normal, soft, nontender, no obvious hepatosplenomegaly  Laboratory No components found for: D1    Assessment:     Iron deficiency anemia  Neurogenic bladder  Urinary sepsis      Plan:     In view of his iron deficiency anemia I recommended EGD and colonoscopy to investigate the GI tract. He wants to think about this, talk to his wife, and with the primary team. I will check back with him tomorrow. He should probably be put on Miralax Bid at this time, prior to colonoscopy prep if he chooses to have it done.  Lab Results  Component Value Date   HGB 6.6* 02/27/2015   HGB 7.0* 02/26/2015   HGB 6.2* 02/26/2015   HCT 19.1* 02/27/2015   HCT 21.2* 02/26/2015   HCT 19.1* 02/26/2015   ALKPHOS 86 12/31/2014   ALKPHOS 83 12/29/2014   ALKPHOS 100 12/16/2014   AST 31 12/31/2014   AST 29 12/29/2014   AST 36 12/16/2014   ALT 14* 12/31/2014   ALT 17 12/29/2014   ALT 18 12/16/2014

## 2015-02-27 NOTE — Progress Notes (Signed)
Family Medicine Teaching Service Daily Progress Note Intern Pager: [phone]  Patient name: Ray Hall Medical record number: [id number] Date of birth: [birth year] Age: 54 y.o. Gender: male  Primary Care Provider: Tawanna Sat, MD Consultants: none Code Status: full  Pt Overview and Major Events to Date:  10/28: patient admitted for UTI/Sepsis  Assessment and Plan: 54 y.o. male presenting with fever, dysuria and feeling sick. PMH is significant for type 2 DM, HTN, BPH, diabetic foot ulcer, GERD, atrial flutter and chronic back pain.   # Sepsis, 2/2 UTI: Patient normotensive with pulse of 83. Patient feels better, going to eat breakfast this morning. UA significant for many bacteria and leukocytosis.  His presentation with dysuria, fever, chill and rigors suggestive for pyelonephritis but he has no CVA tenderness. No documented fevers during hospitalization. - Zosyn for broad coverage -> will transitions to Keflex 500 mg q6hrs  (3/14 days) - Urine and blood culture with >100,000 colonies of Gram negative rods with Klebsiella speciated in urine and blood cultures  - Lactic acid improving 2.8>2.1 >1.2  - WBC trending down, 13.5> 18.9 > 15.4  - Renal ultrasound: Moderate bilateral hydronephrosis of unclear etiology. No abscess present -Continue I&O cath. Refuses foley. I&O caths self at baseline. -Monitor vital signs   # AKI: Cr 2.29 on admission (baseline 0.94). S/p 2L total of NS on admission and IV Fluids for a day  - SCr  1.14 trending down  - KVO fluids at this time and monitor. Monitor BP off of fluids.  # Anemia:  Baseline hgb 9,  - Will provide transfusion of pRBCs, provide tylenol and benadryl 30 minutes before transfusion - Post transfusion H&H - Hemoglobin 6.6, s/p 1 unit overnight with hgb 7.0  (fever of 101.5 post-transfusion)  - Low Iron at 8, elevated Ferritin at 599, low TIBC at 169. Holding iron at this time given infection. - FOBT, no stool recently  - Replete  with IV iron  - GI consulted  # HTN: hypotensive on presentation. Improving. - Stable - Hold home lisinopril  - Continue to monitor  # DM2: Last A1c 7.4 on 12/16/14, on glipizide and metformin at home.  - Hold home meds - Sensitive SSI - CBG ACHS  # FEN/GI:  - Heart healthy diet - Fluids KVO -GI cocktail  Disposition: pending medical improvement  Subjective:  Patient doing well this morning. Denies any complaints of dizziness, palpitations, n/v, fevers or chills overnight    Objective: Temp:  [97.5 F (36.4 C)-101.5 F (38.6 C)] 98.8 F (37.1 C) (10/31 0533) Pulse Rate:  [87-107] 87 (10/31 0533) Resp:  [15-20] 15 (10/31 0533) BP: (104-148)/(58-81) 109/60 mmHg (10/31 0533) SpO2:  [97 %-100 %] 99 % (10/31 0533) Physical Exam: Gen: 54yo male in no apparent distress CV: RRR.  no murmurs.  Resp: no apparent WOB, CTAB,  Abd: +BS. 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, dry scaly skin Neuro: Alert and oriented, No gross focal deficits  Laboratory:  Recent Labs Lab 02/26/15 0932 02/26/15 1211 02/26/15 2055 02/27/15 0218  WBC 18.9* 18.7*  --  15.4*  HGB 6.8* 6.2* 7.0* 6.6*  HCT 21.0* 19.1* 21.2* 19.1*  PLT 435* 431*  --  372    Recent Labs Lab 02/25/15 0300 02/26/15 0932 02/27/15 0218  NA 136 136 137  K 5.3* 5.0 4.5  CL 103 107 110  CO2 20* 18* 19*  BUN 50* 32* 21*  CREATININE 1.81* 1.40* 1.14  CALCIUM  9.0 8.7* 8.7*  GLUCOSE 161* 192* 104*  Lactic Acid: 3.43 >> 2.4 >> 2.8 >> 2.1 Anemia Panel: Iron 8, TIBC 169, Ferritin 599  Imaging/Diagnostic Tests: 10/28 CXR IMPRESSION: No active cardiopulmonary disease.  Mayan Dolney Cletis Media, MD 02/27/2015, 6:20 AM PGY-1, [place] Intern pager: [phone], text pages welcome

## 2015-02-27 NOTE — Progress Notes (Signed)
Called by Rapid Response Nurse.  Attempted to place pt on BIPAP 16/8 100% FIO2 with large mask.  Pt did not tolerate it said he couldn't breathe.  Placed back on 6lpm [place] sat 100%, RR 28.  BS fine crackles.  Tolerated [place] fine able to speak and talk in full sentences.

## 2015-02-27 NOTE — Progress Notes (Signed)
Pt had fever of 101.5 after first unit of blood transfused. MD notified and received orders not to transfuse second unit of blood at this time. MD ordering Hgb and Hct and will review. Tylenol administered for fever.

## 2015-02-27 NOTE — Progress Notes (Signed)
Patient ID: Ray Hall, male   DOB: [birth year], 54 y.o.   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 Patient also has protein caloric malnutrition. Examination of the residual limb on the left he has 2 small ischemic ulcers which are about 5 mm in diameter. There is no purulence no abscess no cellulitis no odor no signs of infection. Recommend that he continue using his stump shrinker recommended improving protein caloric intake with using insure or other protein supplements at least 3-4 times a day. Recommended against the carbohydrate and sugar diet this currently on his plate which includes bread, potato and applesauce. I do not feel the patient needs any antibiotics for the transtibial amputation. I will follow-up in the office.

## 2015-02-28 DIAGNOSIS — A4159 Other Gram-negative sepsis: Secondary | ICD-10-CM | POA: Insufficient documentation

## 2015-02-28 DIAGNOSIS — E43 Unspecified severe protein-calorie malnutrition: Secondary | ICD-10-CM

## 2015-02-28 DIAGNOSIS — A414 Sepsis due to anaerobes: Secondary | ICD-10-CM | POA: Insufficient documentation

## 2015-02-28 DIAGNOSIS — N319 Neuromuscular dysfunction of bladder, unspecified: Secondary | ICD-10-CM

## 2015-02-28 LAB — BASIC METABOLIC PANEL
ANION GAP: 10 (ref 5–15)
BUN: 22 mg/dL — ABNORMAL HIGH (ref 6–20)
CHLORIDE: 101 mmol/L (ref 101–111)
CO2: 18 mmol/L — AB (ref 22–32)
CREATININE: 1.28 mg/dL — AB (ref 0.61–1.24)
Calcium: 7.9 mg/dL — ABNORMAL LOW (ref 8.9–10.3)
GFR calc non Af Amer: >60 mL/min (ref >60)
Glucose, Bld: 331 mg/dL — ABNORMAL HIGH (ref 65–99)
POTASSIUM: 5.1 mmol/L (ref 3.5–5.1)
SODIUM: 129 mmol/L — AB (ref 135–145)

## 2015-02-28 LAB — CBC
HEMATOCRIT: 24.3 % — AB (ref 39.0–52.0)
HEMOGLOBIN: 8 g/dL — AB (ref 13.0–17.0)
MCH: 23.1 pg — AB (ref 26.0–34.0)
MCHC: 32.9 g/dL (ref 30.0–36.0)
MCV: 70 fL — AB (ref 78.0–100.0)
PLATELETS: 424 10*3/uL — AB (ref 150–400)
RBC: 3.47 MIL/uL — AB (ref 4.22–5.81)
RDW: 19.5 % — ABNORMAL HIGH (ref 11.5–15.5)
WBC: 22.3 10*3/uL — AB (ref 4.0–10.5)

## 2015-02-28 LAB — GLUCOSE, CAPILLARY
GLUCOSE-CAPILLARY: 227 mg/dL — AB (ref 65–99)
Glucose-Capillary: 297 mg/dL — ABNORMAL HIGH (ref 65–99)
Glucose-Capillary: 309 mg/dL — ABNORMAL HIGH (ref 65–99)
Glucose-Capillary: 218 mg/dL — ABNORMAL HIGH (ref 65–99)

## 2015-02-28 LAB — OCCULT BLOOD X 1 CARD TO LAB, STOOL: Fecal Occult Bld: NEGATIVE

## 2015-02-28 LAB — HAPTOGLOBIN: Haptoglobin: 422 mg/dL — ABNORMAL HIGH (ref 34–200)

## 2015-02-28 MED ORDER — CEFAZOLIN SODIUM-DEXTROSE 2-3 GM-% IV SOLR: 2.0000 g | Freq: Three times a day (TID) | INTRAVENOUS | Status: DC

## 2015-02-28 MED ORDER — POLYETHYLENE GLYCOL 3350 17 G PO PACK
17.0000 g | PACK | Freq: Two times a day (BID) | ORAL | Status: DC
  Administered 2015-02-28 – 2015-03-01 (×3): 17 g via ORAL
  Filled 2015-02-28 (×4): qty 1

## 2015-02-28 MED ORDER — CEFAZOLIN SODIUM-DEXTROSE 2-3 GM-% IV SOLR
2.0000 g | Freq: Three times a day (TID) | INTRAVENOUS | Status: DC
  Administered 2015-02-28 – 2015-03-01 (×2): 2 g via INTRAVENOUS
  Filled 2015-02-28 (×3): qty 50

## 2015-02-28 NOTE — Evaluation (Signed)
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  Treating for urosepsis.  Clinical Impression  Pt is at or close to baseline functioning and should be safe at home with available family. There are no further acute PT needs.  Pt waiting for final healing of stump to occur.  Will sign off at this time.     Follow Up Recommendations No PT follow up    Equipment Recommendations  None recommended by PT    Recommendations for Other Services       Precautions / Restrictions        Mobility  Bed Mobility Overal bed mobility: Modified Independent                Transfers Overall transfer level: Modified independent Equipment used: Rolling walker (2 wheeled)                Ambulation/Gait Ambulation/Gait assistance: Modified independent (Device/Increase time) Ambulation Distance (Feet): 50 Feet Assistive device: Rolling walker (2 wheeled) Gait Pattern/deviations: Step-to pattern     General Gait Details: steady, fluid swing to gait pattern.    Stairs            Wheelchair Mobility    Modified Rankin (Stroke Patients Only)       Balance Overall balance assessment: Needs assistance Sitting-balance support: No upper extremity supported Sitting balance-Leahy Scale: Normal     Standing balance support: No upper extremity supported;Single extremity supported Standing balance-Leahy Scale: Fair                               Pertinent Vitals/Pain Pain Assessment: No/denies pain    Home Living Family/patient expects to be discharged to:: Private residence Living Arrangements: Spouse/significant other;Children Available Help at Discharge: Family;Available 24 hours/day Type of Home:  House Home Access: Stairs to enter Entrance Stairs-Rails: None Entrance Stairs-Number of Steps: 1 Home Layout: One level Home Equipment: Cane - single point;Walker - 2 wheels      Prior Function Level of Independence: Independent with assistive device(s)               Hand Dominance   Dominant Hand: Right    Extremity/Trunk Assessment   Upper Extremity Assessment: Overall WFL for tasks assessed           Lower Extremity Assessment: Overall WFL for tasks assessed         Communication   Communication: No difficulties  Cognition Arousal/Alertness: Awake/alert Behavior During Therapy: WFL for tasks assessed/performed Overall Cognitive Status: Within Functional Limits for tasks assessed                      General Comments General comments (skin integrity, edema, etc.): Reinforced strengthening, postioning, but pt does not need further PT    Exercises        Assessment/Plan    PT Assessment Patent does not need any further PT services  PT Diagnosis     PT Problem List    PT Treatment Interventions     PT Goals (Current goals can be found in the Care Plan section) Acute Rehab PT Goals PT Goal Formulation: All assessment and education complete, DC therapy    Frequency  Barriers to discharge        Co-evaluation               End of Session   Activity Tolerance: Patient tolerated treatment well Patient left: Other (comment) (sitting EOB) Nurse Communication: Mobility status         Time: 3888-2800 PT Time Calculation (min) (ACUTE ONLY): 19 min   Charges:   PT Evaluation $Initial PT Evaluation Tier I: 1 Procedure     PT G Codes:        Emri Sample, Tessie Fass 02/28/2015, 5:21 PM

## 2015-02-28 NOTE — Care Management Note (Signed)
Case Management Note Marvetta Gibbons RN, BSN Unit 2W-Case Manager [phone]  Patient Details  Name: JAIMEN MELONE MRN: [id number] Date of Birth: 1961-04-25  Subjective/Objective:   Pt admitted with acute kidney injury                 Action/Plan: PTA pt lived at home with spouse- recent BKA in [place]. And was sent home with [place] services with [facility]- RW and w/c was also arranged through [facility] at that time.  Pt does not have insurance and therefore does not meet criteria for [facility] therapy (PT/OT) services under medicaid guidelines. If further HH needs are needed would need to arrange with [facility] for services.    Expected Discharge Date:                  Expected Discharge Plan:  [place]  In-House Referral:     Discharge planning Services  CM Consult  Post Acute Care Choice:    Choice offered to:     DME Arranged:    DME Agency:     HH Arranged:    [place] Agency:     Status of Service:  In process, will continue to follow  Medicare Important Message Given:    Date Medicare IM Given:    Medicare IM give by:    Date Additional Medicare IM Given:    Additional Medicare Important Message give by:     If discussed at [place] of Stay Meetings, dates discussed:    Additional Comments:  Dawayne Patricia, RN 02/28/2015, 9:39 AM

## 2015-02-28 NOTE — Progress Notes (Signed)
Family Medicine Teaching Service Daily Progress Note Intern Pager: [phone]  Patient name: Ray Hall Medical record number: [id number] Date of birth: 1960/12/01 Age: 54 y.o. Gender: male  Primary Care Provider: Tawanna Sat, MD Consultants: none Code Status: full  Pt Overview and Major Events to Date:  10/28: patient admitted for UTI/Sepsis  Assessment and Plan: 54 y.o. male presenting with fever, dysuria and feeling sick. PMH is significant for type 2 DM, HTN, BPH, diabetic foot ulcer, GERD, atrial flutter and chronic back pain.   # Sepsis, 2/2 Complicated UTI and Bactermia: Patient normotensive with pulse of 78. Patient feels like he could get up and run a marathon this morning better. UA significant for many bacteria and leukocytosis. Urine and blood culture with >100,000 colonies of Gram negative rods with Klebsiella speciated in urine and blood cultures . No documented fevers during hospitalization. Lactic acid trending down, 2.8>2.1 >1.2  - Continue CFT  (4/14 days) -> will transition to PO once blood cultures are negative  - Follow up repeat blood cultures  - WBC trending up, however patient received solumedrol for anaphylaxis, 13.5> 18.9 > 15.4 >22.3  - Renal ultrasound: Moderate bilateral hydronephrosis of unclear etiology. No abscess present - Continue I&O cath. Refuses foley. I&O caths self at baseline. - Monitor vital signs - Outpatient follow up with urology, will touch base while here   # AKI: Cr 2.29 on admission (baseline 0.94). S/p 2L total of NS on admission and IV Fluids for a day  - SCr  1.14> 1.28 - Na 129, corrected 131. - Continue to trend BMET   # Anemia:  Baseline hgb 9, patient has fever to pRBC transfusion, second transfusion treated with benadryl and acetamenphin . IV Iron with anaphylaxis  - Hemolysis labs, LDH 242 and  haptoglobin 422 elevated (acute phase reactant) likely due to infection state - Hgb 7.6>6.8> 6.2> 1upRBC (fever of 101.5)7> 6.6 1u  pRBCs>11> 8;  - Low Iron at 8, elevated Ferritin at 599, low TIBC at 169. Holding iron at this time given infection. - FOBT, negative - GI consulted,patient willing to endoscopy/colonscopy, miralax BID  - Continue to trend CBC   # HTN:  - Stable - Hold home lisinopril  - Continue to monitor  # DM2: Last A1c 7.4 on 12/16/14, on glipizide and metformin at home. CBGS in 300s, likely due to Solumedrol.  - Hold home meds - Sensitive SSI  - CBG ACHS  # FEN/GI:  - Heart healthy diet, protein supplement ensure and prostat.  - Fluids KVO - Pepcid   Disposition: pending medical improvement  Subjective:  Patient doing well this morning. Anaphylaxis yesterday received 25mg  Benadryl given IV,1 amp Epi given IV, 125mg  Solumedrol given IV.   Objective: Temp:  [97.3 F (36.3 C)-98.5 F (36.9 C)] 97.7 F (36.5 C) (11/01 0450) Pulse Rate:  [76-145] 78 (11/01 0450) Resp:  [16-22] 18 (11/01 0450) BP: (105-133)/(55-79) 133/79 mmHg (11/01 0450) SpO2:  [96 %-100 %] 100 % (11/01 0450) Physical Exam: Gen: 54yo male in no apparent distress CV: RRR.  no murmurs, rubs or gallops, 2+ radial pulses   Resp: no apparent WOB, CTAB,  Abd: +BS. Soft, NDNT, no rebound or guarding Ext: No lower extremity edema, Left BKA  Neuro: Alert and oriented, No gross focal deficits  Laboratory:  Recent Labs Lab 02/26/15 1211  02/27/15 0218 02/27/15 1604 02/28/15 0440  WBC 18.7*  --  15.4*  --  22.3*  HGB 6.2*  < > 6.6* 11.0* 8.0*  HCT 19.1*  < > 19.1* 33.0* 24.3*  PLT 431*  --  372  --  424*  < > = values in this interval not displayed.  Recent Labs Lab 02/26/15 0932 02/27/15 0218 02/28/15 0440  NA 136 137 129*  K 5.0 4.5 5.1  CL 107 110 101  CO2 18* 19* 18*  BUN 32* 21* 22*  CREATININE 1.40* 1.14 1.28*  CALCIUM 8.7* 8.7* 7.9*  GLUCOSE 192* 104* 331*  Lactic Acid: 3.43 >> 2.4 >> 2.8 >> 2.1 Anemia Panel: Iron 8, TIBC 169, Ferritin 599   Dg Chest 2 View  02/24/2015  CLINICAL DATA:   Syncope EXAM: CHEST  2 VIEW COMPARISON:  12/16/2014 FINDINGS: Normal heart size. Clear lungs. Low volumes. No pneumothorax. No pleural effusion. IMPRESSION: No active cardiopulmonary disease. Electronically Signed   By: Marybelle Killings M.D.   On: 02/24/2015 16:36   US Renal  02/27/2015  CLINICAL DATA:  Sepsis due to Klebsiella (HCC) A41.4 (ICD-10-CM) EXAM: RENAL / URINARY TRACT ULTRASOUND COMPLETE COMPARISON:  None. FINDINGS: Right Kidney: Length: 15.7. Moderate hydronephrosis. Normal parenchymal echogenicity. No mass or stone. Left Kidney: Length: 14.5 cm. Moderate hydronephrosis. Normal parenchymal echogenicity. No mass or stone. Bladder: Some floating debris, but no mass or wall thickening. No ureteral jet seen on either side. IMPRESSION: 1. Moderate bilateral hydronephrosis of unclear etiology. Consider follow-up contrast-enhanced CT of the abdomen pelvis. 2. No renal masses or stones. 3. Small amount of bladder debris, but no wall thickening or bladder mass or stone. Electronically Signed   By: Lajean Manes M.D.   On: 02/27/2015 08:06     Birgitta Uhlir Cletis Media, MD 02/28/2015, 9:15 AM PGY-1, [place] Intern pager: [phone], text pages welcome

## 2015-02-28 NOTE — Progress Notes (Signed)
Utilization review completed.  

## 2015-02-28 NOTE — Progress Notes (Signed)
The patient has agreed to EGD and colonoscopy. He has eaten today and given his history of constipation cleaning him out to get a good exam tomorrow may be a challenge. I will therefore schedule him for Thursday for EGD and colonoscopy. We will prep tomorrow.

## 2015-02-28 NOTE — Progress Notes (Signed)
Began the pt's initial dose of Ancef. Pt called out within 10 minutes saying his IV was burning. Skin surrounding IV site warm to touch and firm. Will discontinue current IV. Next shift will start new IV and start 11pm dose of Ancef. Due to anaphylactic reaction to IV iron yesterday will monitor next dose closely. Pt resting comfortably, call bell within reach.   Fritz Pickerel, RN

## 2015-02-28 NOTE — Progress Notes (Signed)
Pt hgb was 11 yesterday and this am is now 8. Resident notified of significant drop. VSS and pt has no complaints at this time.   Earlie Lou

## 2015-03-01 ENCOUNTER — Ambulatory Visit: Payer: Self-pay | Admitting: Family Medicine

## 2015-03-01 DIAGNOSIS — E1122 Type 2 diabetes mellitus with diabetic chronic kidney disease: Secondary | ICD-10-CM

## 2015-03-01 LAB — CBC
HCT: 26.6 % — ABNORMAL LOW (ref 39.0–52.0)
HEMATOCRIT: 21.5 % — AB (ref 39.0–52.0)
HEMOGLOBIN: 7.2 g/dL — AB (ref 13.0–17.0)
HEMOGLOBIN: 9 g/dL — AB (ref 13.0–17.0)
MCH: 23.3 pg — AB (ref 26.0–34.0)
MCH: 23.9 pg — AB (ref 26.0–34.0)
MCHC: 33.5 g/dL (ref 30.0–36.0)
MCHC: 33.8 g/dL (ref 30.0–36.0)
MCV: 69.6 fL — AB (ref 78.0–100.0)
MCV: 70.6 fL — ABNORMAL LOW (ref 78.0–100.0)
PLATELETS: 509 10*3/uL — AB (ref 150–400)
Platelets: 461 10*3/uL — ABNORMAL HIGH (ref 150–400)
RBC: 3.09 MIL/uL — AB (ref 4.22–5.81)
RBC: 3.77 MIL/uL — ABNORMAL LOW (ref 4.22–5.81)
RDW: 19.7 % — ABNORMAL HIGH (ref 11.5–15.5)
RDW: 19.5 % — ABNORMAL HIGH (ref 11.5–15.5)
WBC: 14.5 10*3/uL — ABNORMAL HIGH (ref 4.0–10.5)
WBC: 11.5 10*3/uL — ABNORMAL HIGH (ref 4.0–10.5)

## 2015-03-01 LAB — COMPREHENSIVE METABOLIC PANEL
ALT: 17 U/L (ref 17–63)
ANION GAP: 14 (ref 5–15)
AST: 29 U/L (ref 15–41)
Albumin: 2.6 g/dL — ABNORMAL LOW (ref 3.5–5.0)
Alkaline Phosphatase: 91 U/L (ref 38–126)
BILIRUBIN TOTAL: 0.3 mg/dL (ref 0.3–1.2)
BUN: 14 mg/dL (ref 6–20)
CHLORIDE: 108 mmol/L (ref 101–111)
CO2: 17 mmol/L — ABNORMAL LOW (ref 22–32)
Calcium: 9 mg/dL (ref 8.9–10.3)
Creatinine, Ser: 0.93 mg/dL (ref 0.61–1.24)
Glucose, Bld: 130 mg/dL — ABNORMAL HIGH (ref 65–99)
POTASSIUM: 4.4 mmol/L (ref 3.5–5.1)
Sodium: 139 mmol/L (ref 135–145)
TOTAL PROTEIN: 6.6 g/dL (ref 6.5–8.1)

## 2015-03-01 LAB — GLUCOSE, CAPILLARY
GLUCOSE-CAPILLARY: 134 mg/dL — AB (ref 65–99)
Glucose-Capillary: 131 mg/dL — ABNORMAL HIGH (ref 65–99)
Glucose-Capillary: 126 mg/dL — ABNORMAL HIGH (ref 65–99)
Glucose-Capillary: 129 mg/dL — ABNORMAL HIGH (ref 65–99)

## 2015-03-01 LAB — BASIC METABOLIC PANEL
Anion gap: 12 (ref 5–15)
BUN: 17 mg/dL (ref 6–20)
CHLORIDE: 108 mmol/L (ref 101–111)
CO2: 17 mmol/L — AB (ref 22–32)
CREATININE: 1.03 mg/dL (ref 0.61–1.24)
Calcium: 8.6 mg/dL — ABNORMAL LOW (ref 8.9–10.3)
GFR calc Af Amer: >60 mL/min (ref >60)
GFR calc non Af Amer: >60 mL/min (ref >60)
GLUCOSE: 165 mg/dL — AB (ref 65–99)
POTASSIUM: 4.2 mmol/L (ref 3.5–5.1)
Sodium: 137 mmol/L (ref 135–145)

## 2015-03-01 LAB — RETICULOCYTES
RBC.: 3.44 MIL/uL — AB (ref 4.22–5.81)
RETIC COUNT ABSOLUTE: 65.4 10*3/uL (ref 19.0–186.0)
RETIC CT PCT: 1.9 % (ref 0.4–3.1)

## 2015-03-01 MED ORDER — SODIUM CHLORIDE 0.9 % IV SOLN: INTRAVENOUS | Status: DC

## 2015-03-01 MED ORDER — CEPHALEXIN 250 MG PO CAPS
250.0000 mg | ORAL_CAPSULE | Freq: Four times a day (QID) | ORAL | Status: DC
  Administered 2015-03-01 – 2015-03-03 (×8): 250 mg via ORAL
  Filled 2015-03-01 (×13): qty 1

## 2015-03-01 MED ORDER — PEG 3350-KCL-NA BICARB-NACL 420 G PO SOLR
4000.0000 mL | Freq: Once | ORAL | Status: AC
  Administered 2015-03-01: 4000 mL via ORAL
  Filled 2015-03-01: qty 4000

## 2015-03-01 NOTE — Progress Notes (Signed)
Nutrition Brief Note  Patient identified on the Malnutrition Screening Tool (MST) Report  Wt Readings from Last 15 Encounters:  02/25/15 270 lb 12.8 oz (122.834 kg)  01/01/15 315 lb 14.7 oz (143.3 kg)  12/23/14 316 lb (143.337 kg)  12/19/14 334 lb 3.5 oz (151.6 kg)  12/16/14 321 lb (145.605 kg)  11/10/14 321 lb (145.605 kg)  11/09/14 323 lb (146.512 kg)  09/07/14 313 lb 7 oz (142.174 kg)  08/24/14 315 lb (142.883 kg)  08/05/14 328 lb (148.78 kg)  04/30/14 314 lb (142.429 kg)  03/09/14 312 lb (141.522 kg)  03/02/14 303 lb 9.2 oz (137.7 kg)  02/08/14 321 lb (145.605 kg)  12/22/13 320 lb 8 oz (145.378 kg)    Body mass index is 29.75 kg/(m^2). Patient meets criteria for obese based on current BMI.   Current diet order is CL, patient is consuming approximately 100% of meals at this time. Labs and medications reviewed.   Pt is undergoing colonoscopy, was switched to clear liquids. Prior to that, pt PO intake 100% for all meals, appetite has improved from previous RD visit. Return EE BID following diet advancement.  No nutrition interventions warranted at this time. If nutrition issues arise, please consult RD.   Satira Anis. Kimberla Driskill, MS, RD LDN After Hours/Weekend Pager [phone]

## 2015-03-01 NOTE — Progress Notes (Signed)
Patient sitting on bed, no distress or pain. Wife at bedside, call light within reach.

## 2015-03-01 NOTE — Progress Notes (Signed)
Patient resting comfortably, no pain or distress. No problems with Ancef/new IV site.  Wife at bedside, call light within reach.

## 2015-03-01 NOTE — Progress Notes (Signed)
Family Medicine Teaching Service Daily Progress Note Intern Pager: [phone]  Patient name: Ray Hall Medical record number: [id number] Date of birth: May 11, 1960 Age: 54 y.o. Gender: male  Primary Care Provider: Tawanna Sat, MD Consultants: none Code Status: full  Pt Overview and Major Events to Date:  10/28: patient admitted for UTI/Sepsis  Assessment and Plan: 54 y.o. male presenting with fever, dysuria and feeling sick. PMH is significant for type 2 DM, HTN, BPH, diabetic foot ulcer, GERD, atrial flutter and chronic back pain.   # Sepsis, 2/2 Complicated UTI and Bactermia: Vitals remain stable. Patient doing well this AM. UA significant for many bacteria and leukocytosis. Urine and blood culture with >100,000 colonies of Gram negative rods with Klebsiella speciated in urine and blood cultures . No documented fevers during hospitalization. Lactic acid trending down, 2.8>2.1 >1.2 Renal ultrasound: Moderate bilateral hydronephrosis of unclear etiology. No abscess present.  - will transition to PO keflex today - Follow up repeat blood cultures negative for 24 hours  - WBC trend down, 13.5> 18.9 > 15.4 >22.3> 14.5 - Continue I&O cath. Refuses foley. I&O caths self at baseline. - Monitor vital signs -Touch based with urology, continue in and out cath, will touch base about how often   # AKI: Resolved. Cr 2.29 on admission (baseline 0.94). S/p 2L total of NS on admission and IV Fluids for a day  - SCr  1.14> 1.28>1.03 - Continue to trend BMET   # Anemia:  Baseline hgb 9, patient has fever to pRBC transfusion, second transfusion treated with benadryl and acetamenphin . Patient experienced IV Iron with anaphylaxis Hemolysis labs, LDH 242 and  haptoglobin 422 elevated (acute phase reactant) likely due to infection state, no concern for hemolysis. Hgb 7.6>6.8> 6.2> 1upRBC (fever of 101.5)7> 6.6 1u pRBCs>11> 8>7.2.  - Hgb trending down, 8>7.2  - Reticulocytes and will check bilirubin  -  Low Iron at 8, elevated Ferritin at 599, low TIBC at 169. Holding iron at this time given infection. - FOBT, negative - GI for clean out today  - Will get an afternoon CBC to recheck hg   # HTN:  - Stable - Hold home lisinopril  - Continue to monitor  # DM2: Last A1c 7.4 on 12/16/14, on glipizide and metformin at home. CBGS in 300s, likely due to Solumedrol.  - Hold home meds - Sensitive SSI  - CBG ACHS  # FEN/GI:  - Heart healthy diet, protein supplement ensure and prostat.  - Fluids KVO - Pepcid   Disposition: pending medical improvement  Subjective:  Patient denies dizziness, headache, n/v, any pain.    Objective: Temp:  [97.7 F (36.5 C)-98.5 F (36.9 C)] 97.9 F (36.6 C) (11/02 0505) Pulse Rate:  [73-92] 73 (11/02 0505) Resp:  [18] 18 (11/02 0505) BP: (111-141)/(66-89) 111/66 mmHg (11/02 0505) SpO2:  [99 %-100 %] 100 % (11/02 0505) Physical Exam: Gen: 54yo male in no apparent distress CV: RRR.  no murmurs, rubs or gallops, 2+ radial pulses   Resp: no apparent WOB, CTAB,  Abd: +BS. Soft, NDNT, no rebound or guarding Ext: No lower extremity edema, Left BKA  Neuro: Alert and oriented, No gross focal deficits  Laboratory:  Recent Labs Lab 02/27/15 0218 02/27/15 1604 02/28/15 0440 03/01/15 0419  WBC 15.4*  --  22.3* 14.5*  HGB 6.6* 11.0* 8.0* 7.2*  HCT 19.1* 33.0* 24.3* 21.5*  PLT 372  --  424* 461*    Recent Labs Lab 02/27/15 0218 02/28/15 0440 03/01/15 0419  NA 137 129* 137  K 4.5 5.1 4.2  CL 110 101 108  CO2 19* 18* 17*  BUN 21* 22* 17  CREATININE 1.14 1.28* 1.03  CALCIUM 8.7* 7.9* 8.6*  GLUCOSE 104* 331* 165*  Lactic Acid: 3.43 >> 2.4 >> 2.8 >> 2.1 Anemia Panel: Iron 8, TIBC 169, Ferritin 599   Dg Chest 2 View  02/24/2015  CLINICAL DATA:  Syncope EXAM: CHEST  2 VIEW COMPARISON:  12/16/2014 FINDINGS: Normal heart size. Clear lungs. Low volumes. No pneumothorax. No pleural effusion. IMPRESSION: No active cardiopulmonary disease.  Electronically Signed   By: Marybelle Killings M.D.   On: 02/24/2015 16:36   US Renal  02/27/2015  CLINICAL DATA:  Sepsis due to Klebsiella (HCC) A41.4 (ICD-10-CM) EXAM: RENAL / URINARY TRACT ULTRASOUND COMPLETE COMPARISON:  None. FINDINGS: Right Kidney: Length: 15.7. Moderate hydronephrosis. Normal parenchymal echogenicity. No mass or stone. Left Kidney: Length: 14.5 cm. Moderate hydronephrosis. Normal parenchymal echogenicity. No mass or stone. Bladder: Some floating debris, but no mass or wall thickening. No ureteral jet seen on either side. IMPRESSION: 1. Moderate bilateral hydronephrosis of unclear etiology. Consider follow-up contrast-enhanced CT of the abdomen pelvis. 2. No renal masses or stones. 3. Small amount of bladder debris, but no wall thickening or bladder mass or stone. Electronically Signed   By: Lajean Manes M.D.   On: 02/27/2015 08:06     Sandralee Tarkington Cletis Media, MD 03/01/2015, 7:11 AM PGY-1, [employer] Intern pager: [phone], text pages welcome

## 2015-03-01 NOTE — Progress Notes (Signed)
RN educated pt concerning golytley- instructed that the entire container must be drank throughout the day. Pt is concerned he wont be able to drink all of it. RN encouraged to drink it slowly and he has time to pace himself.   Will continue to monitor.   Earlie Lou

## 2015-03-02 ENCOUNTER — Inpatient Hospital Stay (HOSPITAL_COMMUNITY): Payer: Medicaid Other | Admitting: Anesthesiology

## 2015-03-02 ENCOUNTER — Encounter (HOSPITAL_COMMUNITY)
Admission: EM | Disposition: A | Discharge status: Home / Self Care | Payer: Self-pay | Source: Home / Self Care | Attending: Family Medicine

## 2015-03-02 ENCOUNTER — Encounter (HOSPITAL_COMMUNITY): Payer: Self-pay | Admitting: Anesthesiology

## 2015-03-02 HISTORY — PX: ESOPHAGOGASTRODUODENOSCOPY (EGD) WITH PROPOFOL: SHX5813

## 2015-03-02 LAB — BASIC METABOLIC PANEL
Anion gap: 11 (ref 5–15)
BUN: 10 mg/dL (ref 6–20)
CALCIUM: 9.3 mg/dL (ref 8.9–10.3)
CO2: 20 mmol/L — ABNORMAL LOW (ref 22–32)
CREATININE: 0.94 mg/dL (ref 0.61–1.24)
Chloride: 106 mmol/L (ref 101–111)
GFR calc Af Amer: >60 mL/min (ref >60)
GLUCOSE: 111 mg/dL — AB (ref 65–99)
Potassium: 4.3 mmol/L (ref 3.5–5.1)
SODIUM: 137 mmol/L (ref 135–145)

## 2015-03-02 LAB — TYPE AND SCREEN
ABO/RH(D): AB POS
ANTIBODY SCREEN: NEGATIVE
UNIT DIVISION: 0
UNIT DIVISION: 0
UNIT DIVISION: 0
Unit division: 0

## 2015-03-02 LAB — GLUCOSE, CAPILLARY
GLUCOSE-CAPILLARY: 108 mg/dL — AB (ref 65–99)
GLUCOSE-CAPILLARY: 141 mg/dL — AB (ref 65–99)
GLUCOSE-CAPILLARY: 98 mg/dL (ref 65–99)
Glucose-Capillary: 154 mg/dL — ABNORMAL HIGH (ref 65–99)

## 2015-03-02 LAB — CBC
HCT: 27.5 % — ABNORMAL LOW (ref 39.0–52.0)
Hemoglobin: 9.1 g/dL — ABNORMAL LOW (ref 13.0–17.0)
MCH: 23.3 pg — AB (ref 26.0–34.0)
MCHC: 33.1 g/dL (ref 30.0–36.0)
MCV: 70.5 fL — AB (ref 78.0–100.0)
PLATELETS: 537 10*3/uL — AB (ref 150–400)
RBC: 3.9 MIL/uL — ABNORMAL LOW (ref 4.22–5.81)
RDW: 19.8 % — AB (ref 11.5–15.5)
WBC: 13 10*3/uL — ABNORMAL HIGH (ref 4.0–10.5)

## 2015-03-02 SURGERY — COLONOSCOPY WITH PROPOFOL
Anesthesia: Monitor Anesthesia Care

## 2015-03-02 SURGERY — ESOPHAGOGASTRODUODENOSCOPY (EGD) WITH PROPOFOL
Anesthesia: Monitor Anesthesia Care

## 2015-03-02 MED ORDER — LACTATED RINGERS IV SOLN
INTRAVENOUS | Status: DC
  Administered 2015-03-02: 09:00:00 via INTRAVENOUS

## 2015-03-02 MED ORDER — PROPOFOL 500 MG/50ML IV EMUL
INTRAVENOUS | Status: DC | PRN
  Administered 2015-03-02: 100 ug/kg/min via INTRAVENOUS

## 2015-03-02 MED ORDER — PROPOFOL 10 MG/ML IV BOLUS
INTRAVENOUS | Status: DC | PRN
  Administered 2015-03-02 (×2): 20 mg via INTRAVENOUS

## 2015-03-02 MED ORDER — LIDOCAINE HCL (CARDIAC) 20 MG/ML IV SOLN
INTRAVENOUS | Status: DC | PRN
  Administered 2015-03-02: 50 mg via INTRAVENOUS

## 2015-03-02 MED ORDER — PEG 3350-KCL-NA BICARB-NACL 420 G PO SOLR
4000.0000 mL | Freq: Once | ORAL | Status: AC
  Administered 2015-03-02: 4000 mL via ORAL
  Filled 2015-03-02: qty 4000

## 2015-03-02 MED ORDER — BUTAMBEN-TETRACAINE-BENZOCAINE 2-2-14 % EX AERO
INHALATION_SPRAY | CUTANEOUS | Status: DC | PRN
  Administered 2015-03-02: 2 via TOPICAL

## 2015-03-02 MED ORDER — FLUCONAZOLE 100 MG PO TABS
200.0000 mg | ORAL_TABLET | ORAL | Status: DC
  Administered 2015-03-02: 200 mg via ORAL
  Filled 2015-03-02: qty 2

## 2015-03-02 MED ORDER — LACTATED RINGERS IV SOLN
INTRAVENOUS | Status: DC | PRN
  Administered 2015-03-02: 10:00:00 via INTRAVENOUS

## 2015-03-02 NOTE — Anesthesia Procedure Notes (Signed)
Procedure Name: MAC Date/Time: 03/02/2015 10:27 AM Performed by: Eligha Bridegroom Pre-anesthesia Checklist: Patient identified, Emergency Drugs available, Suction available, Patient being monitored and Timeout performed Patient Re-evaluated:Patient Re-evaluated prior to inductionOxygen Delivery Method: Nasal cannula Preoxygenation: Pre-oxygenation with 100% oxygen Intubation Type: IV induction

## 2015-03-02 NOTE — Op Note (Signed)
[facility] [place], [id number]   ENDOSCOPY PROCEDURE REPORT  PATIENT: Ray Hall, Ray Hall  MR#: [id number] BIRTHDATE: 1961-02-02 , 53  yrs. old GENDER: male ENDOSCOPIST: Acquanetta Sit, MD REFERRED BY: PROCEDURE DATE:  March 05, 2015 PROCEDURE:  EGD with biopsy ASA CLASS:     2 INDICATIONS:  heart deficiency anemia MEDICATIONS: propofol per anesthesia TOPICAL ANESTHETIC: Cetacaine spray  DESCRIPTION OF PROCEDURE: After the risks benefits and alternatives of the procedure were thoroughly explained, informed consent was obtained.  The PENTAX GASTOROSCOPE [id number] endoscope was introduced through the mouth and advanced to the second portion of the duodenum , Without limitations.  The instrument was slowly withdrawn as the mucosa was fully examined. Estimated blood loss is zero unless otherwise noted in this procedure report.  Findings:  Esophagus: There are multiple patchy white exudates in the distal and midesophagus suggestive of candida esophagitis. Biopsies obtained.  Stomach: Normal  Duodenum: Normal    The scope was then withdrawn from the patient and the procedure completed.  COMPLICATIONS: There were no immediate complications.  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 Would empirically begin treatment with Diflucan for Candida esophagitis pending biopsies.   REPEAT EXAM:  eSignedAcquanetta Sit, MD 2015-03-05 10:38 AM    CC:  CPT CODES: ICD CODES:  The ICD and CPT codes recommended by this software are interpretations from the data that the clinical staff has captured with the software.  The verification of the translation of this report to the ICD and CPT codes and modifiers is the sole responsibility of the health care institution and practicing physician where this report was generated.  [place]. will not be held responsible for the validity of the ICD  and CPT codes included on this report.  AMA assumes no liability for data contained or not contained herein. CPT is a Designer, television/film set of the [employer].

## 2015-03-02 NOTE — Transfer of Care (Signed)
Immediate Anesthesia Transfer of Care Note  Patient: Ray Hall  Procedure(s) Performed: Procedure(s): ESOPHAGOGASTRODUODENOSCOPY (EGD) WITH PROPOFOL (N/A)  Patient Location: PACU and Endoscopy Unit  Anesthesia Type:MAC  Level of Consciousness: awake, alert  and oriented  Airway & Oxygen Therapy: Patient Spontanous Breathing and Patient connected to nasal cannula oxygen  Post-op Assessment: Report given to RN and Post -op Vital signs reviewed and stable  Post vital signs: Reviewed and stable  Last Vitals:  Filed Vitals:   03/02/15 1035  BP: 119/66  Pulse: 79  Temp: 36.6 C  Resp: 14    Complications: No apparent anesthesia complications

## 2015-03-02 NOTE — Progress Notes (Signed)
Patient lying in bed ,no distress or pain. Wife at bedside, call light within reach.

## 2015-03-02 NOTE — Progress Notes (Signed)
EGD done and shows what looks like Candida esophagitis. Biopsies obtained.  Colonoscopy was not done because of inadequate prep. The patient will be reprepped again today and colonoscopy will be planned again for tomorrow.

## 2015-03-02 NOTE — Anesthesia Postprocedure Evaluation (Signed)
  Anesthesia Post-op Note  Patient: Ray Hall  Procedure(s) Performed: Procedure(s): ESOPHAGOGASTRODUODENOSCOPY (EGD) WITH PROPOFOL (N/A)  Patient Location: PACU  Anesthesia Type:MAC  Level of Consciousness: awake and alert   Airway and Oxygen Therapy: Patient Spontanous Breathing  Post-op Pain: none  Post-op Assessment: Post-op Vital signs reviewed              Post-op Vital Signs: Reviewed  Last Vitals:  Filed Vitals:   03/02/15 1100  BP: 135/79  Pulse: 70  Temp:   Resp: 15    Complications: No apparent anesthesia complications

## 2015-03-02 NOTE — Progress Notes (Signed)
Family Medicine Teaching Service Daily Progress Note Intern Pager: [phone]  Patient name: Ray Hall Medical record number: [id number] Date of birth: 1961-01-15 Age: 54 y.o. Gender: male  Primary Care Provider: Tawanna Sat, MD Consultants: none Code Status: full  Pt Overview and Major Events to Date:  10/28: patient admitted for UTI/Sepsis  Assessment and Plan: 54 y.o. male presenting with fever, dysuria and feeling sick. PMH is significant for type 2 DM, HTN, BPH, diabetic foot ulcer, GERD, atrial flutter and chronic back pain.   # Sepsis, 2/2 Complicated UTI and Bactermia: Vitals remain stable. Patient doing well this AM. UA significant for many bacteria and leukocytosis. Urine and blood culture with >100,000 colonies of Gram negative rods with Klebsiella speciated in urine and blood cultures . No documented fevers during hospitalization. Lactic acid trending down, 2.8>2.1 >1.2 Renal ultrasound: Moderate bilateral hydronephrosis of unclear etiology. No abscess present. - Continue  PO keflex today (Day 6/ 14 days) - Follow up repeat blood cultures negative for 48 hours  - WBC trend down, 13.5> 18.9 > 15.4 >22.3> 14.5>13 - Continue I&O cath. Refuses foley. I&O caths self at baseline. - Monitor vital signs -Touch based with urology, continue in and out cath, will touch base about how often   # AKI: Resolved. Cr 2.29 on admission (baseline 0.94). S/p 2L total of NS on admission and IV Fluids for a day. SCr  1.14> 1.28>1.03>0.94  # Anemia:  Baseline hgb 9, patient has fever to pRBC transfusion, second transfusion treated with benadryl and acetamenphin . Patient experienced IV Iron with anaphylaxis Hemolysis labs, LDH 242 and  haptoglobin 422 elevated (acute phase reactant) likely due to infection state, no concern for hemolysis. Hgb 7.6>6.8> 6.2> 1upRBC (fever of 101.5)7> 6.6 1u pRBCs>11> 8>7.2.  Reticulocyte percent count 1.2% bone marrow not really responding.  - Hgb trending up hgb  9.1, back to baseline  - Low Iron at 8, elevated Ferritin at 599, low TIBC at 169, will start PO iron for outpatient  - FOBT, negative - GI for EGD and colonoscopy  - CBC tomorrow morning   # HTN:  - Stable - Hold home lisinopril  - Continue to monitor  # DM2: Last A1c 7.4 on 12/16/14, on glipizide and metformin at home. CBGS in 300s, likely due to Solumedrol.  - Hold home meds - Sensitive SSI  - CBG ACHS  # FEN/GI:  - Heart healthy diet, protein supplement ensure and prostat.  - Fluids KVO - Pepcid   Disposition: pending medical improvement  Subjective:  Patient doing well. Tolerated the EGD well.    Objective: Temp:  [97.7 F (36.5 C)-98.5 F (36.9 C)] 98.5 F (36.9 C) (11/03 0527) Pulse Rate:  [78-100] 83 (11/03 0527) Resp:  [20] 20 (11/03 0527) BP: (143-150)/(83-93) 143/84 mmHg (11/03 0527) SpO2:  [99 %-100 %] 100 % (11/03 0527) Physical Exam: Gen: 54yo male in no apparent distress CV: RRR.  no murmurs, rubs or gallops, 2+ radial pulses   Resp: no apparent WOB, CTAB,  Abd: +BS. Soft, NDNT, no rebound or guarding Ext: No lower extremity edema, Left BKA  Neuro: Alert and oriented, No gross focal deficits  Laboratory:  Recent Labs Lab 03/01/15 0419 03/01/15 1510 03/02/15 0310  WBC 14.5* 11.5* 13.0*  HGB 7.2* 9.0* 9.1*  HCT 21.5* 26.6* 27.5*  PLT 461* 509* 537*    Recent Labs Lab 03/01/15 0419 03/01/15 1100 03/02/15 0310  NA 137 139 137  K 4.2 4.4 4.3  CL 108 108 106  CO2 17* 17* 20*  BUN 17 14 10   CREATININE 1.03 0.93 0.94  CALCIUM 8.6* 9.0 9.3  PROT  --  6.6  --   BILITOT  --  0.3  --   ALKPHOS  --  91  --   ALT  --  17  --   AST  --  29  --   GLUCOSE 165* 130* 111*  Lactic Acid: 3.43 >> 2.4 >> 2.8 >> 2.1 Anemia Panel: Iron 8, TIBC 169, Ferritin 599   Dg Chest 2 View  02/24/2015  CLINICAL DATA:  Syncope EXAM: CHEST  2 VIEW COMPARISON:  12/16/2014 FINDINGS: Normal heart size. Clear lungs. Low volumes. No pneumothorax. No pleural  effusion. IMPRESSION: No active cardiopulmonary disease. Electronically Signed   By: Marybelle Killings M.D.   On: 02/24/2015 16:36   US Renal  02/27/2015  CLINICAL DATA:  Sepsis due to Klebsiella (HCC) A41.4 (ICD-10-CM) EXAM: RENAL / URINARY TRACT ULTRASOUND COMPLETE COMPARISON:  None. FINDINGS: Right Kidney: Length: 15.7. Moderate hydronephrosis. Normal parenchymal echogenicity. No mass or stone. Left Kidney: Length: 14.5 cm. Moderate hydronephrosis. Normal parenchymal echogenicity. No mass or stone. Bladder: Some floating debris, but no mass or wall thickening. No ureteral jet seen on either side. IMPRESSION: 1. Moderate bilateral hydronephrosis of unclear etiology. Consider follow-up contrast-enhanced CT of the abdomen pelvis. 2. No renal masses or stones. 3. Small amount of bladder debris, but no wall thickening or bladder mass or stone. Electronically Signed   By: Lajean Manes M.D.   On: 02/27/2015 08:06     Calan Doren Cletis Media, MD 03/02/2015, 7:10 AM PGY-1, [place] Intern pager: [phone], text pages welcome

## 2015-03-02 NOTE — Anesthesia Preprocedure Evaluation (Addendum)
Anesthesia Evaluation  Patient identified by MRN, date of birth, ID band Patient awake    Reviewed: Allergy & Precautions, NPO status , Patient's Chart, lab work & pertinent test results  Airway Mallampati: II  TM Distance: >3 FB Neck ROM: Full    Dental   Pulmonary former smoker,    breath sounds clear to auscultation       Cardiovascular hypertension, Pt. on medications + dysrhythmias (s/p ablation)  Rhythm:Regular Rate:Normal  62012 EF 45-50% on TTE.   Neuro/Psych Anxiety Depression negative neurological ROS     GI/Hepatic Neg liver ROS, GERD  ,  Endo/Other  diabetes, Type 2, Oral Hypoglycemic Agents  Renal/GU negative Renal ROS     Musculoskeletal  (+) Arthritis ,   Abdominal   Peds  Hematology  (+) Sickle cell trait and anemia ,   Anesthesia Other Findings   Reproductive/Obstetrics                           Anesthesia Physical Anesthesia Plan  ASA: III  Anesthesia Plan: MAC   Post-op Pain Management:    Induction: Intravenous  Airway Management Planned: Nasal Cannula and Natural Airway  Additional Equipment:   Intra-op Plan:   Post-operative Plan:   Informed Consent: I have reviewed the patients History and Physical, chart, labs and discussed the procedure including the risks, benefits and alternatives for the proposed anesthesia with the patient or authorized representative who has indicated his/her understanding and acceptance.     Plan Discussed with: CRNA  Anesthesia Plan Comments:         Anesthesia Quick Evaluation

## 2015-03-03 ENCOUNTER — Inpatient Hospital Stay (HOSPITAL_COMMUNITY): Payer: Medicaid Other | Admitting: Anesthesiology

## 2015-03-03 ENCOUNTER — Encounter (HOSPITAL_COMMUNITY): Payer: Self-pay | Admitting: Gastroenterology

## 2015-03-03 ENCOUNTER — Encounter (HOSPITAL_COMMUNITY)
Admission: EM | Disposition: A | Discharge status: Home / Self Care | Payer: Self-pay | Source: Home / Self Care | Attending: Family Medicine

## 2015-03-03 DIAGNOSIS — D500 Iron deficiency anemia secondary to blood loss (chronic): Secondary | ICD-10-CM | POA: Insufficient documentation

## 2015-03-03 HISTORY — PX: COLONOSCOPY: SHX5424

## 2015-03-03 LAB — BASIC METABOLIC PANEL
Anion gap: 11 (ref 5–15)
BUN: 6 mg/dL (ref 6–20)
CALCIUM: 8.9 mg/dL (ref 8.9–10.3)
CO2: 23 mmol/L (ref 22–32)
Chloride: 104 mmol/L (ref 101–111)
Creatinine, Ser: 0.85 mg/dL (ref 0.61–1.24)
GFR calc Af Amer: >60 mL/min (ref >60)
GLUCOSE: 128 mg/dL — AB (ref 65–99)
Potassium: 4 mmol/L (ref 3.5–5.1)
Sodium: 138 mmol/L (ref 135–145)

## 2015-03-03 LAB — CBC
HCT: 25.1 % — ABNORMAL LOW (ref 39.0–52.0)
Hemoglobin: 8.5 g/dL — ABNORMAL LOW (ref 13.0–17.0)
MCH: 24.1 pg — AB (ref 26.0–34.0)
MCHC: 33.9 g/dL (ref 30.0–36.0)
MCV: 71.1 fL — AB (ref 78.0–100.0)
PLATELETS: 454 10*3/uL — AB (ref 150–400)
RBC: 3.53 MIL/uL — ABNORMAL LOW (ref 4.22–5.81)
RDW: 20.2 % — AB (ref 11.5–15.5)
WBC: 9.8 10*3/uL (ref 4.0–10.5)

## 2015-03-03 LAB — GLUCOSE, CAPILLARY: Glucose-Capillary: 131 mg/dL — ABNORMAL HIGH (ref 65–99)

## 2015-03-03 SURGERY — COLONOSCOPY
Anesthesia: Monitor Anesthesia Care

## 2015-03-03 SURGERY — COLONOSCOPY WITH PROPOFOL
Anesthesia: Monitor Anesthesia Care

## 2015-03-03 MED ORDER — FLUCONAZOLE 200 MG PO TABS: 200.0000 mg | ORAL_TABLET | ORAL | Status: AC

## 2015-03-03 MED ORDER — PHENYLEPHRINE HCL 10 MG/ML IJ SOLN
INTRAMUSCULAR | Status: DC | PRN
  Administered 2015-03-03 (×2): 40 ug via INTRAVENOUS

## 2015-03-03 MED ORDER — CEPHALEXIN 250 MG PO CAPS: 500.0000 mg | ORAL_CAPSULE | Freq: Two times a day (BID) | ORAL | Status: DC

## 2015-03-03 MED ORDER — MIDAZOLAM HCL 2 MG/2ML IJ SOLN
INTRAMUSCULAR | Status: DC | PRN
  Administered 2015-03-03 (×2): 1 mg via INTRAVENOUS

## 2015-03-03 MED ORDER — LISINOPRIL 40 MG PO TABS
40.0000 mg | ORAL_TABLET | Freq: Every day | ORAL | Status: DC
  Administered 2015-03-03: 40 mg via ORAL
  Filled 2015-03-03: qty 1

## 2015-03-03 MED ORDER — CEPHALEXIN 250 MG PO CAPS: 250.0000 mg | ORAL_CAPSULE | Freq: Four times a day (QID) | ORAL | Status: DC

## 2015-03-03 MED ORDER — PROPOFOL 10 MG/ML IV BOLUS
INTRAVENOUS | Status: DC | PRN
  Administered 2015-03-03 (×3): 10 mg via INTRAVENOUS
  Administered 2015-03-03 (×2): 20 mg via INTRAVENOUS
  Administered 2015-03-03 (×4): 10 mg via INTRAVENOUS

## 2015-03-03 MED ORDER — CEPHALEXIN 250 MG PO CAPS: 500.0000 mg | ORAL_CAPSULE | Freq: Four times a day (QID) | ORAL | Status: DC

## 2015-03-03 MED ORDER — LACTATED RINGERS IV SOLN
INTRAVENOUS | Status: DC
  Administered 2015-03-03: 12:00:00 via INTRAVENOUS
  Administered 2015-03-03: 1000 mL via INTRAVENOUS

## 2015-03-03 MED ORDER — LIDOCAINE HCL (CARDIAC) 20 MG/ML IV SOLN
INTRAVENOUS | Status: DC | PRN
  Administered 2015-03-03: 10 mg via INTRAVENOUS
  Administered 2015-03-03: 40 mg via INTRAVENOUS

## 2015-03-03 MED ORDER — IRON 325 (65 FE) MG PO TABS: 325.0000 mg | ORAL_TABLET | Freq: Once | ORAL | Status: DC

## 2015-03-03 MED ORDER — PANTOPRAZOLE SODIUM 40 MG PO TBEC: 40.0000 mg | DELAYED_RELEASE_TABLET | Freq: Every day | ORAL | Status: DC

## 2015-03-03 NOTE — Transfer of Care (Signed)
Immediate Anesthesia Transfer of Care Note  Patient: Ray Hall  Procedure(s) Performed: Procedure(s): COLONOSCOPY (N/A)  Patient Location: Endoscopy Unit  Anesthesia Type:MAC  Level of Consciousness: awake, alert  and oriented  Airway & Oxygen Therapy: Patient connected to nasal cannula oxygen  Post-op Assessment: Report given to RN  Post vital signs: stable  Last Vitals:  Filed Vitals:   03/03/15 1108  BP: 169/97  Pulse: 86  Temp: 36.7 C  Resp: 12    Complications: No apparent anesthesia complications

## 2015-03-03 NOTE — Progress Notes (Signed)
03/03/2015 2:54 PM Discharge AVS meds taken today and those due this evening reviewed.  Follow-up appointments and when to call md reviewed.  D/C IV and TELE.  Questions and concerns addressed.   D/C home per orders. [place]

## 2015-03-03 NOTE — Anesthesia Preprocedure Evaluation (Signed)
Anesthesia Evaluation  Patient identified by MRN, date of birth, ID band Patient awake    Reviewed: Allergy & Precautions, NPO status , Patient's Chart, lab work & pertinent test results  Airway Mallampati: II  TM Distance: >3 FB Neck ROM: Full    Dental   Pulmonary former smoker,    breath sounds clear to auscultation       Cardiovascular hypertension, Pt. on medications + dysrhythmias (s/p ablation)  Rhythm:Regular Rate:Normal  62012 EF 45-50% on TTE.   Neuro/Psych Anxiety Depression negative neurological ROS     GI/Hepatic Neg liver ROS, GERD  ,  Endo/Other  diabetes, Type 2, Oral Hypoglycemic Agents  Renal/GU negative Renal ROS     Musculoskeletal  (+) Arthritis ,   Abdominal   Peds  Hematology  (+) Sickle cell trait and anemia ,   Anesthesia Other Findings   Reproductive/Obstetrics                             Anesthesia Physical  Anesthesia Plan  ASA: III  Anesthesia Plan: MAC   Post-op Pain Management:    Induction: Intravenous  Airway Management Planned: Nasal Cannula and Natural Airway  Additional Equipment:   Intra-op Plan:   Post-operative Plan:   Informed Consent: I have reviewed the patients History and Physical, chart, labs and discussed the procedure including the risks, benefits and alternatives for the proposed anesthesia with the patient or authorized representative who has indicated his/her understanding and acceptance.     Plan Discussed with: CRNA  Anesthesia Plan Comments:         Anesthesia Quick Evaluation

## 2015-03-03 NOTE — Anesthesia Postprocedure Evaluation (Signed)
  Anesthesia Post-op Note  Patient: Ray Hall  Procedure(s) Performed: Procedure(s): COLONOSCOPY (N/A)  Patient Location: PACU  Anesthesia Type:MAC  Level of Consciousness: awake and alert   Airway and Oxygen Therapy: Patient Spontanous Breathing  Post-op Pain: none  Post-op Assessment: Post-op Vital signs reviewed              Post-op Vital Signs: Reviewed  Last Vitals:  Filed Vitals:   03/03/15 1411  BP: 149/89  Pulse: 94  Temp: 36.4 C  Resp: 20    Complications: No apparent anesthesia complications

## 2015-03-03 NOTE — Discharge Instructions (Signed)
You were admitted for infection in the urinary and blood. You were treated with antibiotics for this infection. Please follow up with your urologist following this infection. You also have an appointment with your primary care doctor.   Medications  1. Keflex 500 mg four times a day, morning, midday, dinner, and before bed for 7 days  2. Please start taking oral iron daily  3. Please stop taking rantidine, start taking protonix for better reflux control 4. Pleas continue taking diflucan once daily for 12 days

## 2015-03-03 NOTE — Progress Notes (Signed)
Family Medicine Teaching Service Daily Progress Note Intern Pager: [phone]  Patient name: Ray Hall Medical record number: [id number] Date of birth: 1960-11-05 Age: 54 y.o. Gender: male  Primary Care Provider: Tawanna Sat, MD Consultants: none Code Status: full  Pt Overview and Major Events to Date:  10/28: patient admitted for UTI/Sepsis  Assessment and Plan: 54 y.o. male presenting with fever, dysuria and feeling sick. PMH is significant for type 2 DM, HTN, BPH, diabetic foot ulcer, GERD, atrial flutter and chronic back pain.   # Sepsis, 2/2 Complicated UTI and Bactermia: Vitals remain stable. Patient doing well this AM. UA significant for many bacteria and leukocytosis. Urine and blood culture with >100,000 colonies of Gram negative rods with Klebsiella speciated in urine and blood cultures . No documented fevers during hospitalization. Lactic acid trending down, 2.8>2.1 >1.2 Renal ultrasound: Moderate bilateral hydronephrosis of unclear etiology. No abscess present. - Continue  PO keflex today (Day 7/ 14 days) - WBC trend down, 13.5> 18.9 > 15.4 >22.3> 14.5>13>9.8 - Continue I&O cath. Refuses foley. I&O caths self at baseline. - Monitor vital signs -Touch based with urology, continue in and out cath, will touch base about how often   # Anemia:  Baseline hgb 9, patient has fever to pRBC transfusion, second transfusion treated with benadryl and acetamenphin . Patient experienced IV Iron with anaphylaxis Hemolysis labs, LDH 242 and  haptoglobin 422 elevated (acute phase reactant) likely due to infection state, no concern for hemolysis. Hgb 7.6>6.8> 6.2> 1upRBC (fever of 101.5)7> 6.6 1u pRBCs>11> 8>7.2.  Reticulocyte percent count 1.2% bone marrow not really responding.  -  hgb 8.5, near baseline - Low Iron at 8, elevated Ferritin at 599, low TIBC at 169, will start PO iron for outpatient  - FOBT, negative - GI for colonoscopy  - Discharge today ??  # HTN: BP 150/90 - Stable -  Restart home lisinopril, SCr  0.8 - Continue to monitor  # DM2: Last A1c 7.4 on 12/16/14, on glipizide and metformin at home. CBGs 128, 131 - Hold home meds - Sensitive SSI  - CBG ACHS  # FEN/GI:  - Heart healthy diet, protein supplement ensure and prostat.  - Fluids KVO - Pepcid   DVT prophylaxis: mechanical DVT   Disposition: pending medical improvement  Subjective:  Patient doing well. Ready to go home. Patient without fevers, chills, n/v, and  palpations   Objective: Temp:  [97.8 F (36.6 C)-98.7 F (37.1 C)] 98.5 F (36.9 C) (11/04 0305) Pulse Rate:  [70-95] 82 (11/04 0305) Resp:  [14-23] 17 (11/04 0305) BP: (119-150)/(63-79) 140/78 mmHg (11/04 0305) SpO2:  [100 %] 100 % (11/04 0305) Weight:  [273 lb 8 oz (124.059 kg)] 273 lb 8 oz (124.059 kg) (11/04 0305) Physical Exam: Gen: 54yo male in no apparent distress CV: RRR.  no murmurs, rubs or gallops, 2+ radial pulses   Resp: no apparent WOB, CTAB,  Abd: +BS. Soft, NDNT, no rebound or guarding Ext: No lower extremity edema, Left BKA  Neuro: Alert and oriented, No gross focal deficits  Laboratory:  Recent Labs Lab 03/01/15 1510 03/02/15 0310 03/03/15 0246  WBC 11.5* 13.0* 9.8  HGB 9.0* 9.1* 8.5*  HCT 26.6* 27.5* 25.1*  PLT 509* 537* 454*    Recent Labs Lab 03/01/15 1100 03/02/15 0310 03/03/15 0246  NA 139 137 138  K 4.4 4.3 4.0  CL 108 106 104  CO2 17* 20* 23  BUN 14 10 6   CREATININE 0.93 0.94 0.85  CALCIUM 9.0 9.3  8.9  PROT 6.6  --   --   BILITOT 0.3  --   --   ALKPHOS 91  --   --   ALT 17  --   --   AST 29  --   --   GLUCOSE 130* 111* 128*  Lactic Acid: 3.43 >> 2.4 >> 2.8 >> 2.1 Anemia Panel: Iron 8, TIBC 169, Ferritin 599  Dg Chest 2 View  02/24/2015  CLINICAL DATA:  Syncope EXAM: CHEST  2 VIEW COMPARISON:  12/16/2014 FINDINGS: Normal heart size. Clear lungs. Low volumes. No pneumothorax. No pleural effusion. IMPRESSION: No active cardiopulmonary disease. Electronically Signed   By: Marybelle Killings M.D.   On: 02/24/2015 16:36   US Renal  02/27/2015  CLINICAL DATA:  Sepsis due to Klebsiella (HCC) A41.4 (ICD-10-CM) EXAM: RENAL / URINARY TRACT ULTRASOUND COMPLETE COMPARISON:  None. FINDINGS: Right Kidney: Length: 15.7. Moderate hydronephrosis. Normal parenchymal echogenicity. No mass or stone. Left Kidney: Length: 14.5 cm. Moderate hydronephrosis. Normal parenchymal echogenicity. No mass or stone. Bladder: Some floating debris, but no mass or wall thickening. No ureteral jet seen on either side. IMPRESSION: 1. Moderate bilateral hydronephrosis of unclear etiology. Consider follow-up contrast-enhanced CT of the abdomen pelvis. 2. No renal masses or stones. 3. Small amount of bladder debris, but no wall thickening or bladder mass or stone. Electronically Signed   By: Lajean Manes M.D.   On: 02/27/2015 08:06    [name] Cletis Media, MD 03/03/2015, 9:23 AM PGY-1, [place] Intern pager: [phone], text pages welcome

## 2015-03-04 ENCOUNTER — Other Ambulatory Visit: Payer: Self-pay | Admitting: Internal Medicine

## 2015-03-04 LAB — CULTURE, BLOOD (ROUTINE X 2)
CULTURE: NO GROWTH
Culture: NO GROWTH

## 2015-03-04 MED ORDER — CEPHALEXIN 250 MG PO CAPS: 500.0000 mg | ORAL_CAPSULE | Freq: Four times a day (QID) | ORAL | Status: DC

## 2015-03-04 MED ORDER — CEPHALEXIN 250 MG PO CAPS: 500.0000 mg | ORAL_CAPSULE | Freq: Four times a day (QID) | ORAL | Status: AC

## 2015-03-04 MED ORDER — CEPHALEXIN 250 MG PO CAPS: 250.0000 mg | ORAL_CAPSULE | Freq: Four times a day (QID) | ORAL | Status: DC

## 2015-03-04 NOTE — Discharge Summary (Signed)
[facility] Discharge Summary  Patient name: Ray Hall Medical record number: [id number] Date of birth: [birth year] Age: 54 y.o. Gender: male Date of Admission: 02/24/2015  Date of Discharge: 03/03/2015 Admitting Physician: Blane Ohara McDiarmid, MD  Primary Care Provider: Tawanna Sat, MD Consultants: GI   Indication for Hospitalization: Urosepsis  Discharge Diagnoses/Problem List:  Patient Active Problem List   Diagnosis Date Noted  . Iron deficiency anemia due to chronic blood loss   . Sepsis due to Klebsiella ([place])   . Protein-calorie malnutrition, severe 02/25/2015  . Microcytic anemia 02/25/2015  . Neurogenic bladder 02/25/2015  . Uncontrolled type 2 diabetes mellitus with hyperosmolarity without coma ([place])   . Acute kidney injury ([place]) 02/24/2015  . UTI (urinary tract infection) 02/24/2015  . Left below knee amputation (BKA) 01/13/2015  . Acid reflux 12/23/2014  . Anemia 12/23/2014  . Headache 12/23/2014  . Chest pain 12/17/2014  . Diabetic foot ulcer ([place]) 12/16/2014  . Severe sepsis ([place]) 12/16/2014  . Atrial flutter ([place]) 12/16/2014  . LOW BACK PAIN SYNDROME, SEVERE 04/16/2010  . History of recurrent UTIs 12/09/2008  . Diabetes mellitus, type II ([place]) 06/26/2008  . Essential hypertension 06/24/2008   Disposition: Home   Discharge Condition: Stable   Discharge Exam:   Brief Hospital Course:  Patient presented with urosepsis, meeting 2/4 SIRS (tachycardia and hypotension) on presentation and a lactic acid 3.43. He has no leukocytosis or objective fever on presentation. However, with symptoms dysuria, fever, chill and rigors suggestive for pyelonephritis. Furthermore, patient hx included multiple past UTI in the setting of neurogenic bladder and daily I&O catheterizations for maintenance of bladder. Patient blood pressure responded to fluid hydration, and lactic acid trended down. Patient did develop leukocytosis  during stay, however  this likewise trended down with antibiotic treatment. Blood and urine cultures grew Klebsiella pneumonia which was adequately treated with IV antibiotics starting with CFT, narrowing to ANICEF. Repeat blood cultures were obtained and were found to negative for growth, patient was then transitioned to PO antibiotics. During admission patient developed acute anemia with hgb drop to 6.6  (Baseline hgb 9). Patient received 2 transfusions, with first transfusion resulting in febrile rxn, and the second transfusion being treated treated with benadryl and acetaminophen prior to transfusion. Patient also received IV Iron, however this was stopped as patient developed anaphylaxis requiring treatment.  Hemolysis labs where obtained with an LDH 242 and haptoglobin 422 elevated (acute phase reactant) likely due to infection state, no concern for hemolysis. Reticulocyte percent count 1.2%which seemed to point towards patient state of anemia of chronic disease as well as low iron stores (Iron 8, elevated Ferritin at 599, low TIBC at 169). GI was consulted for possibility of GI bleed, however colonoscopy was negative and endoscopy was positive for only esophogeal candida ( HIV negative  11/2014)  Issues for Follow Up:  1. Patient started on PPI with hx of uncontrolled GERD and esophogeal candida, stopped PRN  Zantac, consider continuing as needed  2. Patient to take Diflucan daily for a total of 14 days until 03/15/2015 for esophogeal candida  3. Recheck CBC to ensure hemoglobin stable, stable at  approximately hgb of 9.0 for 2 days prior to discharge, Patient discharged with Iron supplement as well.  4.  Patient to continue Keflex for another 7 days until 03/10/2015,  On discharge I prescribed Keflex 500 mg QID (with the 250 mg tablet), I only prescribed 28 tablets which was not an adequate number of pills. On 11/5,  I confirmed that patient has picked up the prescription for keflex and prescribed another 28 tablets for the  full dose patient needs to take. I explained this to the the patient and indicated that he would need to pick up a second prescription of keflex. Please confirm with the patient that this occurred. He should be taking 2 pills four times a day. Also medication list does not have keflex on it due to deletion when Re-prescribing after discharge.   Significant Procedures:   03/02/2015 Endoscopy - positive for Esophagus, biopsy CANDIDA ESOPHAGITIS, biopsy without concern of malignancy.  03/03/2015 Colonoscopy - normal colonoscopy to the cecum  Significant Labs and Imaging:   Recent Labs Lab 03/01/15 1510 03/02/15 0310 03/03/15 0246  WBC 11.5* 13.0* 9.8  HGB 9.0* 9.1* 8.5*  HCT 26.6* 27.5* 25.1*  PLT 509* 537* 454*    Recent Labs Lab 02/28/15 0440 03/01/15 0419 03/01/15 1100 03/02/15 0310 03/03/15 0246  NA 129* 137 139 137 138  K 5.1 4.2 4.4 4.3 4.0  CL 101 108 108 106 104  CO2 18* 17* 17* 20* 23  GLUCOSE 331* 165* 130* 111* 128*  BUN 22* 17 14 10 6   CREATININE 1.28* 1.03 0.93 0.94 0.85  CALCIUM 7.9* 8.6* 9.0 9.3 8.9  ALKPHOS  --   --  91  --   --   AST  --   --  29  --   --   ALT  --   --  17  --   --   ALBUMIN  --   --  2.6*  --   --    Dg Chest 2 View  02/24/2015  CLINICAL DATA:  Syncope EXAM: CHEST  2 VIEW COMPARISON:  12/16/2014 FINDINGS: Normal heart size. Clear lungs. Low volumes. No pneumothorax. No pleural effusion. IMPRESSION: No active cardiopulmonary disease. Electronically Signed   By: Marybelle Killings M.D.   On: 02/24/2015 16:36   US Renal  02/27/2015  CLINICAL DATA:  Sepsis due to Klebsiella (HCC) A41.4 (ICD-10-CM) EXAM: RENAL / URINARY TRACT ULTRASOUND COMPLETE COMPARISON:  None. FINDINGS: Right Kidney: Length: 15.7. Moderate hydronephrosis. Normal parenchymal echogenicity. No mass or stone. Left Kidney: Length: 14.5 cm. Moderate hydronephrosis. Normal parenchymal echogenicity. No mass or stone. Bladder: Some floating debris, but no mass or wall thickening. No  ureteral jet seen on either side. IMPRESSION: 1. Moderate bilateral hydronephrosis of unclear etiology. Consider follow-up contrast-enhanced CT of the abdomen pelvis. 2. No renal masses or stones. 3. Small amount of bladder debris, but no wall thickening or bladder mass or stone. Electronically Signed   By: Lajean Manes M.D.   On: 02/27/2015 08:06    Results/Tests Pending at Time of Discharge: None   Discharge Medications:    Medication List    STOP taking these medications        BC FAST PAIN RELIEF ARTHRITIS 1000-65 MG Pack  Generic drug:  Aspirin-Caffeine     oxyCODONE 5 MG immediate release tablet  Commonly known as:  Oxy IR/ROXICODONE     ranitidine 150 MG tablet  Commonly known as:  ZANTAC     SUMAtriptan 50 MG tablet  Commonly known as:  IMITREX      TAKE these medications        aspirin EC 81 MG tablet  Take 1 tablet (81 mg total) by mouth daily.     atorvastatin 40 MG tablet  Commonly known as:  LIPITOR  Take 1 tablet (40 mg total) by mouth daily at  6 PM.     cyanocobalamin 1000 MCG tablet  Take 1 tablet (1,000 mcg total) by mouth daily.     cyclobenzaprine 10 MG tablet  Commonly known as:  FLEXERIL  TAKE ONE TABLET BY MOUTH THREE TIMES DAILY AS NEEDED FOR MUSCLE SPASM     diphenhydramine-acetaminophen 25-500 MG Tabs tablet  Commonly known as:  TYLENOL PM  Take 2 tablets by mouth at bedtime.     ENSURE  Take 237 mLs by mouth 2 (two) times daily between meals.     fluconazole 200 MG tablet  Commonly known as:  DIFLUCAN  Take 1 tablet (200 mg total) by mouth daily.     gabapentin 100 MG capsule  Commonly known as:  NEURONTIN  Take 1 capsule (100 mg total) by mouth 3 (three) times daily.     glipiZIDE 10 MG tablet  Commonly known as:  GLUCOTROL  TAKE ONE TABLET BY MOUTH TWICE DAILY BEFORE MEAL(S)     Iron 325 (65 FE) MG Tabs  Take 325 mg by mouth once.     lisinopril 40 MG tablet  Commonly known as:  PRINIVIL,ZESTRIL  Take 1 tablet (40 mg total)  by mouth daily.     metFORMIN 1000 MG tablet  Commonly known as:  GLUCOPHAGE  Take 1 tablet (1,000 mg total) by mouth 2 (two) times daily with a meal.     pantoprazole 40 MG tablet  Commonly known as:  PROTONIX  Take 1 tablet (40 mg total) by mouth daily.        Discharge Instructions: Please refer to Patient Instructions section of EMR for full details.  Patient was counseled important signs and symptoms that should prompt return to medical care, changes in medications, dietary instructions, activity restrictions, and follow up appointments.   Follow-Up Appointments: Follow-up Information    Follow up with Newt Minion, MD On 03/15/2015.   Specialty:  Orthopedic Surgery   Why:  pt app is on 03/15/15 @1 :45   Contact information:   [address] [place] [address] [phone]       Follow up with Tawanna Sat, MD. Go on 03/07/2015.   Specialty:  Family Medicine   Why:  Hospital follow-up @ 2PM    Contact information:   [place] [address] [phone]       Tonette Bihari, MD 03/05/2015, 12:59 PM PGY-1, [place]

## 2015-03-05 ENCOUNTER — Other Ambulatory Visit: Payer: Self-pay | Admitting: Internal Medicine

## 2015-03-06 ENCOUNTER — Encounter (HOSPITAL_COMMUNITY): Payer: Self-pay | Admitting: Gastroenterology

## 2015-03-06 NOTE — Op Note (Signed)
[facility] [place], [id number]   COLONOSCOPY PROCEDURE REPORT     EXAM DATE: March 13, 2015  PATIENT NAME:      Ray Hall, Ray Hall           MR #:      [id number]  BIRTHDATE:       1960-11-21      VISIT #:     [id number]  ATTENDING:     Acquanetta Sit, MD     STATUS:     outpatient ASSISTANT:      Lovie Macadamia, [place], and [place], [place]  INDICATIONS:  The patient is a 54 yr old male here for a colonoscopy due to  iron deficiency anemia PROCEDURE PERFORMED:      colonoscopy MEDICATIONS:      propofol per anesthesia ESTIMATED BLOOD LOSS:     None  CONSENT: The patient understands the risks and benefits of the procedure and understands that these risks include, but are not limited to: sedation, allergic reaction, infection, perforation and/or bleeding. Alternative means of evaluation and treatment include, among others: physical exam, x-rays, and/or surgical intervention. The patient elects to proceed with this endoscopic procedure.  DESCRIPTION OF PROCEDURE: During intra-op preparation period all mechanical & medical equipment was checked for proper function. Hand hygiene and appropriate measures for infection prevention was taken. After the risks, benefits and alternatives of the procedure were thoroughly explained, Informed consent was verified, confirmed and timeout was successfully executed by the treatment team. A digital exam  was normal      The Pentax Adult Colon [id number] endoscope was introduced through the anus and advanced to the  cecum     .  prep quality good      The instrument was then slowly withdrawn as the colon was fully examined.Estimated blood loss is zero unless otherwise noted in this procedure report. The scope was then completely withdrawn from the patient and the procedure terminated. SCOPE WITHDRAWAL TIME:  findings : the colonoscopy to the cecum was normal    ADVERSE EVENTS:      There  were no immediate complications.  IMPRESSIONS:          normal colonoscopy to the cecum  RECOMMENDATIONS:          iron supplementation RECALL:          repeat colonoscopy for screening in 10 years  we will sign off call if needed.  _____________________________ Acquanetta Sit, MD eSigned:  Acquanetta Sit, MD 03-13-2015 12:42 PM   cc:   CPT CODES: ICD CODES:  The ICD and CPT codes recommended by this software are interpretations from the data that the clinical staff has captured with the software.  The verification of the translation of this report to the ICD and CPT codes and modifiers is the sole responsibility of the health care institution and practicing physician where this report was generated.  [employer] will not be held responsible for the validity of the ICD and CPT codes included on this report.  AMA assumes no liability for data contained or not contained herein. CPT is a Designer, television/film set of the [employer].

## 2015-03-07 ENCOUNTER — Encounter: Payer: Self-pay | Admitting: Family Medicine

## 2015-03-07 ENCOUNTER — Ambulatory Visit (INDEPENDENT_AMBULATORY_CARE_PROVIDER_SITE_OTHER): Payer: Self-pay | Admitting: Family Medicine

## 2015-03-07 VITALS — BP 132/78 | HR 109 | Temp 98.1°F | Ht 79.0 in | Wt 271.0 lb

## 2015-03-07 DIAGNOSIS — D509 Iron deficiency anemia, unspecified: Secondary | ICD-10-CM

## 2015-03-07 DIAGNOSIS — Z1159 Encounter for screening for other viral diseases: Secondary | ICD-10-CM

## 2015-03-07 DIAGNOSIS — A419 Sepsis, unspecified organism: Secondary | ICD-10-CM

## 2015-03-07 DIAGNOSIS — N390 Urinary tract infection, site not specified: Secondary | ICD-10-CM

## 2015-03-07 NOTE — Patient Instructions (Addendum)
Iron pill: 65mg  elemental Fe (iron)  Iron rich foods include green leafy vegetables, red meats, foods fortified with iron.

## 2015-03-07 NOTE — Assessment & Plan Note (Signed)
Hospitalization had acute blood loss. Negative colonoscopy/endoscopy in hospital. Did not start iron supplement yet. Counseled regarding not likely to have reaction to oral iron, recommended starting today. Check CBC today as well. Will follow up with results.

## 2015-03-07 NOTE — Assessment & Plan Note (Signed)
Improving, no current signs of infection. Still has several days left of keflex. Recommended holding off on self catheterization for now as he is voiding quit well. Recommended continued adequate hydration. Follow up 2 months or sooner if needed.

## 2015-03-07 NOTE — Progress Notes (Signed)
   Subjective:    Patient ID: Ray Hall, male    DOB: 1961-03-16, 54 y.o.   MRN: [id number]  HPI  CC: hospital follow up  # UTI/sepsis:  Taking keflex as prescribed, was sent 250mg  tablets when he should be taking 500mg  so he has been taking 2 tablets every 6 hours. Has to pick up the new prescription to continue taking  No current complaints since leaving the hospital ROS: no fevers, no pain  # Anemia  Found to be anemic with iron deficiency in hospital, had reactions to transfusion (febrile) and IV iron (anaphylaxis)  Has not started the oral iron because he was afraid from the IV treatment reaction  No blood in stool  Social Hx: former smoker (quit 3 months ago)  Review of Systems   See HPI for ROS.   Past medical history, surgical, family, and social history reviewed and updated in the EMR as appropriate. Objective:  BP 132/78 mmHg  Pulse 109  Temp(Src) 98.1 F (36.7 C) (Oral)  Ht 6\' 7"  (2.007 m)  Wt 271 lb (122.925 kg)  BMI 30.52 kg/m2 Vitals and nursing note reviewed  General: NAD CV: RRR (borderline tachycardic), normal heart sounds, no murmur appreciated. 2+ radial pulses bilaterally  Resp: clear to auscultation bilaterally, normal effort Back: no CVA tenderness Abdomen: obese, soft, nondistended, no suprapubic tenderness, normal bowel sounds Extremities: left BKA. Neuro: alert and oriented Psych: normal thought content and speech  Assessment & Plan:  Microcytic anemia Hospitalization had acute blood loss. Negative colonoscopy/endoscopy in hospital. Did not start iron supplement yet. Counseled regarding not likely to have reaction to oral iron, recommended starting today. Check CBC today as well. Will follow up with results.  Sepsis secondary to UTI [facility]) Improving, no current signs of infection. Still has several days left of keflex. Recommended holding off on self catheterization for now as he is voiding quit well. Recommended continued adequate  hydration. Follow up 2 months or sooner if needed.

## 2015-03-08 LAB — CBC
HCT: 27.7 % — ABNORMAL LOW (ref 39.0–52.0)
HEMOGLOBIN: 8.8 g/dL — AB (ref 13.0–17.0)
MCH: 23.1 pg — AB (ref 26.0–34.0)
MCHC: 31.8 g/dL (ref 30.0–36.0)
MCV: 72.7 fL — ABNORMAL LOW (ref 78.0–100.0)
MPV: 8 fL — ABNORMAL LOW (ref 8.6–12.4)
Platelets: 505 10*3/uL — ABNORMAL HIGH (ref 150–400)
RBC: 3.81 MIL/uL — AB (ref 4.22–5.81)
RDW: 21.7 % — ABNORMAL HIGH (ref 11.5–15.5)
WBC: 10.4 10*3/uL (ref 4.0–10.5)

## 2015-03-08 LAB — HEPATITIS C ANTIBODY: HCV AB: NEGATIVE

## 2015-03-20 ENCOUNTER — Telehealth: Payer: Self-pay | Admitting: Family Medicine

## 2015-03-20 NOTE — Telephone Encounter (Signed)
Called and informed patient of negative Hepatitis C screen and stable blood count/anemia. He mentioned he thought he needed refills on the diflucan and keflex since he ran out, I informed him at this point he does not need "prophylactic" treatment for either of these and that if he had issues he needs to be seen by the doctor for further treatment. He says he currently has no symptoms. He had no further questions. -Dr. Lamar Benes

## 2015-03-31 ENCOUNTER — Other Ambulatory Visit: Payer: Self-pay | Admitting: Family Medicine

## 2015-04-09 ENCOUNTER — Other Ambulatory Visit: Payer: Self-pay | Admitting: Family Medicine

## 2015-05-15 ENCOUNTER — Ambulatory Visit: Payer: Self-pay | Admitting: Family Medicine

## 2015-05-18 ENCOUNTER — Encounter: Payer: Self-pay | Admitting: Student

## 2015-05-18 ENCOUNTER — Ambulatory Visit (INDEPENDENT_AMBULATORY_CARE_PROVIDER_SITE_OTHER): Payer: Medicaid Other | Admitting: Student

## 2015-05-18 VITALS — BP 144/82 | HR 92 | Temp 98.0°F | Wt 290.0 lb

## 2015-05-18 DIAGNOSIS — N3289 Other specified disorders of bladder: Secondary | ICD-10-CM | POA: Diagnosis not present

## 2015-05-18 DIAGNOSIS — R32 Unspecified urinary incontinence: Secondary | ICD-10-CM | POA: Diagnosis not present

## 2015-05-18 LAB — POCT URINALYSIS DIPSTICK
Bilirubin, UA: NEGATIVE
Glucose, UA: 100
KETONES UA: NEGATIVE
Nitrite, UA: NEGATIVE
Protein, UA: 100
SPEC GRAV UA: 1.015
Urobilinogen, UA: 0.2
pH, UA: 6.5

## 2015-05-18 LAB — POCT UA - MICROSCOPIC ONLY

## 2015-05-18 LAB — POCT GLYCOSYLATED HEMOGLOBIN (HGB A1C): HEMOGLOBIN A1C: 9.9

## 2015-05-18 NOTE — Patient Instructions (Signed)
Follow up in 2 weeks for shoulder pain Please check your blood sugars in the AM and at night If you have questions or concerns, call the office at [phone]

## 2015-05-21 DIAGNOSIS — M25519 Pain in unspecified shoulder: Secondary | ICD-10-CM | POA: Insufficient documentation

## 2015-05-21 DIAGNOSIS — R32 Unspecified urinary incontinence: Secondary | ICD-10-CM | POA: Insufficient documentation

## 2015-05-21 NOTE — Progress Notes (Signed)
Subjective:    Patient ID: Ray Hall, male    DOB: [birth year], 55 y.o.   MRN: [id number]   CC: Urine leakage at night  HPI: 55 y/o wth DM2 s/p left BKA presents for urine leakage at night and right shoulder pain.   Urine leakage at night - Pt reports increased urine leakage at night for the last 3-4 nights - denies urine leakage during the day - he has had similar urine leakage to this in past - he has a history of bladder injury previously followed by urology but he has not been seen by then for approximately a year due to insurance issues - he has a history of self cathing but has been told by hs PCP he no longer needs to - reports complete bladder emptying - when he notices leaking at night, he typically has wet only his underwear but not clothes or sheets - he then goes to the restroom and voids a normal normal volume of urine - denies dysuria, fever or abdominal pain   DM - last A1c 10/28 was 6.5 - checks glucose in the AM and has been running 160--180s - denies increased urine frequency during the night - denies chest pain SOB  Left BKA - Significantly he has recently been seen by orthopedic surgery to follow up BKA and given concern for wound infection he was started on a course of doxycycline - he denies pain ai this site  Right shoulder pain - Started 2 days ago after sleeping in a hospital bedside chair - when he awoke in the AM his shoulder was sore - his wife was hospitalized, so he stayed with her overnight - he reports pain with elevating his arm, no weakness   Review of Systems ROS Per HPI else denies recent illness, N/V/D, weakness  Past Medical, Surgical, Social, and Family History Reviewed & Updated per EMR.   Objective:  BP 144/82 mmHg  Pulse 92  Temp(Src) 98 F (36.7 C) (Oral)  Wt 290 lb (131.543 kg) Vitals and nursing note reviewed  General: NAD, ,in a wheel chair Cardiac: RRR, Respiratory: CTAB, normal effort MSK: no tenderness  over right shoulder to palpation, normal ROM of bilateral shoulders, mild pain with elevation of right arm, 5/5 bilateral upper extremity strength, Exam limited by the patient being wheel chair bound Abdomen: soft, nontender, nondistended, + BS Extremities: left BKA Neuro: alert and oriented   Assessment & Plan:    Diabetes mellitus, type II ([place]) Concern that elevated blood sugars may be causing increased urine frequency and potential leakage at night - will obtain A1c today as he is due for this - will follow with PCP for potential DM regimen adjustment  Enuresis Night time urine leakage with subsequent void of full bladder, pt does not note increase urine frequency during the day, dysuria or abdominal pain making UTI less likely. Given history of bladder injury with management by urology thereis concern or potential contribution by this. Additionally the fasting blood sugars that he reports are above goal which could also contribute - will collect UA today to evaluate for potential UTI or glucose spill - will follow with urology as he has recently obtained medicaid  Pain in joint, shoulder region Right shoulder pain after sleeping in a chair with normal range of motion and strength, likely due to poor sleeping position - Will continue to follow and if does not improve, will consider further work up     [place]. Naphtali Riede MD, [place]  Family Medicine Resident PGY-2 Pager [phone]

## 2015-05-21 NOTE — Assessment & Plan Note (Addendum)
Concern that elevated blood sugars may be causing increased urine frequency and potential leakage at night - will obtain A1c today as he is due for this - will follow with PCP for potential DM regimen adjustment

## 2015-05-21 NOTE — Assessment & Plan Note (Addendum)
Night time urine leakage with subsequent void of full bladder, pt does not note increase urine frequency during the day, dysuria or abdominal pain making UTI less likely. Given history of bladder injury with management by urology thereis concern or potential contribution by this. Additionally the fasting blood sugars that he reports are above goal which could also contribute - will collect UA today to evaluate for potential UTI or glucose spill - will follow with urology as he has recently obtained medicaid

## 2015-05-21 NOTE — Assessment & Plan Note (Signed)
Right shoulder pain after sleeping in a chair with normal range of motion and strength, likely due to poor sleeping position - Will continue to follow and if does not improve, will consider further work up

## 2015-05-23 ENCOUNTER — Other Ambulatory Visit: Payer: Self-pay | Admitting: Family Medicine

## 2015-05-24 ENCOUNTER — Other Ambulatory Visit: Payer: Self-pay | Admitting: Family Medicine

## 2015-05-24 NOTE — Telephone Encounter (Signed)
Wife brought in Rx bottle for gabapentin.  No refills  [facility]   [place]

## 2015-05-25 NOTE — Telephone Encounter (Signed)
Patient is aware and has already picked up script. Calab Sachse,CMA

## 2015-06-01 ENCOUNTER — Ambulatory Visit: Payer: Self-pay | Admitting: Family Medicine

## 2015-06-06 ENCOUNTER — Ambulatory Visit: Payer: Self-pay | Admitting: Family Medicine

## 2015-06-06 ENCOUNTER — Ambulatory Visit (INDEPENDENT_AMBULATORY_CARE_PROVIDER_SITE_OTHER): Payer: Medicaid Other | Admitting: Family Medicine

## 2015-06-06 ENCOUNTER — Encounter: Payer: Self-pay | Admitting: Family Medicine

## 2015-06-06 VITALS — BP 130/78 | HR 105 | Temp 98.1°F

## 2015-06-06 DIAGNOSIS — E11621 Type 2 diabetes mellitus with foot ulcer: Secondary | ICD-10-CM | POA: Diagnosis not present

## 2015-06-06 DIAGNOSIS — E1122 Type 2 diabetes mellitus with diabetic chronic kidney disease: Secondary | ICD-10-CM | POA: Diagnosis not present

## 2015-06-06 DIAGNOSIS — L97519 Non-pressure chronic ulcer of other part of right foot with unspecified severity: Secondary | ICD-10-CM

## 2015-06-06 DIAGNOSIS — M25511 Pain in right shoulder: Secondary | ICD-10-CM | POA: Diagnosis not present

## 2015-06-06 DIAGNOSIS — R32 Unspecified urinary incontinence: Secondary | ICD-10-CM | POA: Diagnosis not present

## 2015-06-06 NOTE — Progress Notes (Signed)
Subjective:    Patient ID: Ray Hall, male    DOB: [birth year], 55 y.o.   MRN: [id number]  HPI  CC: follow up   # Urinary leakage:  Primarily at night. This has improved over the past few weeks but still getting about 1x per night, whereas before it was happening 5 times a night. He still does void 5-6 times per night overall  Biggest change he has made is in his diet, he cut out the juices that were his main drink. Sugar control discussion below  He does endorse some other urinary symptoms: difficulty starting stream, doesn't endorse post void dribbling. ROS: no dysuria, no increased frequency during the day  # Diabetes  A1c 9.9 3 weeks ago. He says he was doing very bad with this diet. He had cut out sodas and was doing really well but someone had told him he could drink juices and he made this is primary drink over past several months, particularly while he is on "vacation"  Taking his glipizide and metformin with no concerns  Fasting CBG this morning was 192, says range has been 140-220 over past several weeks ROS: urinary symptoms as above  # Right shoulder  Started after sleeping in hospital chair last month  Still giving him issues  Pain is "all over", front, back, top; if has to choose worse area it would be in the front  Has difficulty raising arm above head, hand behind back  Says it feels like his rotator cuff like his left shoulder was before  # Right toes  Developed a few skin breakdown on 2nd and 4th toes on the top, these seem to be healing okay  Also wonders if he broke his right great toe again; never got the repeat x-rays. It was broken in January and still had pain back in April.  Social Hx: former smoker  Review of Systems   See HPI for ROS.   Past medical history, surgical, family, and social history reviewed and updated in the EMR as appropriate. Objective:  BP 130/78 mmHg  Pulse 105  Temp(Src) 98.1 F (36.7 C) (Oral)  Wt  Vitals  and nursing note reviewed  General: no apparent distress CV: borderline tachycardic, regular rhythm, no murmurs, rubs or gallop.  Resp: clear to auscultation bilaterally, normal effort Extremities: left BKA. Right toe 2nd and 4th with well healing ulcerative appearing lesion, no drainage Right shoulder TTP diffusely. He has pain with abduction and cross arm scratch test. Strength is grossly normal. Negative empty can.  Assessment & Plan:  Enuresis Improved with better control of sugar (cutting out his fruit juices). Suspect his neurogenic bladder also contributing. Has some prostate/flow symptoms but says his "prostate was checked" and it was fine. Recommend he continue sugar control, follow up with urology now that he has medicaid again.  Diabetes mellitus, type II ([place]) A1c reviewed, 9.9 which is up from 6.5 in October. He attributes this to drinking fruit juices since he was told they were fine. I counseled him to discontinue this, and if repeat A1c in 3 months is elevated will need to make changes to his regimen; for now we will continue his glipizide 10mg , metformin 1000mg . Follow up 3 months.  Pain in joint, shoulder region In setting of recent fall and injury. He may have some underlying tendinosis. Discussed PT referral, he says he still has his HEP from when he had rotator cuff surgery on the left. Asked him to start doing these exercises  and if not improving over the next 1-2 months to return and we will discuss next options.   Diabetic foot ulcer Had significant ulcer issues last August which led to left BKA. The lesions today look to be well healing, not infected. Asked him to change shoes for wider toebox, make sure he has good padding/socks and if notices any breaks in the skin to keep them clean and likely use more open shoes to help promote healing. He is aware of major red flags (pus, drainage, redness, tenderness). Follow up asn eeded.

## 2015-06-06 NOTE — Patient Instructions (Addendum)
A1c is 9.9 --- this is UNCONTROLLED, you were 6.9 back in October.  Our goal is under 7.5.  If you make sure to fix your diet and take your medicines, and it is under control in the next visit then we don't need to make any changes to your medicines.   Right shoulder: likely a rotator cuff tendinopathy. Do the exercises at home that the PT had gone over and if you are not getting better over the next few months we can talk about possible referral  Make a referral to the urologist.

## 2015-06-08 NOTE — Assessment & Plan Note (Addendum)
Had significant ulcer issues last August which led to left BKA. The lesions today look to be well healing, not infected. Asked him to change shoes for wider toebox, make sure he has good padding/socks and if notices any breaks in the skin to keep them clean and likely use more open shoes to help promote healing. He is aware of major red flags (pus, drainage, redness, tenderness). Follow up asn eeded.

## 2015-06-08 NOTE — Assessment & Plan Note (Signed)
Improved with better control of sugar (cutting out his fruit juices). Suspect his neurogenic bladder also contributing. Has some prostate/flow symptoms but says his "prostate was checked" and it was fine. Recommend he continue sugar control, follow up with urology now that he has medicaid again.

## 2015-06-08 NOTE — Assessment & Plan Note (Signed)
In setting of recent fall and injury. He may have some underlying tendinosis. Discussed PT referral, he says he still has his HEP from when he had rotator cuff surgery on the left. Asked him to start doing these exercises and if not improving over the next 1-2 months to return and we will discuss next options.

## 2015-06-08 NOTE — Assessment & Plan Note (Signed)
A1c reviewed, 9.9 which is up from 6.5 in October. He attributes this to drinking fruit juices since he was told they were fine. I counseled him to discontinue this, and if repeat A1c in 3 months is elevated will need to make changes to his regimen; for now we will continue his glipizide 10mg , metformin 1000mg . Follow up 3 months.

## 2015-07-07 ENCOUNTER — Emergency Department (HOSPITAL_COMMUNITY): Payer: Medicaid Other

## 2015-07-07 ENCOUNTER — Inpatient Hospital Stay (HOSPITAL_COMMUNITY)
Admission: EM | Admit: 2015-07-07 | Discharge: 2015-07-17 | DRG: 423 | Disposition: A | Discharge status: Home / Self Care | Payer: Medicaid Other | Attending: Family Medicine | Admitting: Family Medicine

## 2015-07-07 ENCOUNTER — Encounter (HOSPITAL_COMMUNITY): Payer: Self-pay | Admitting: Emergency Medicine

## 2015-07-07 DIAGNOSIS — E1142 Type 2 diabetes mellitus with diabetic polyneuropathy: Secondary | ICD-10-CM | POA: Diagnosis present

## 2015-07-07 DIAGNOSIS — I739 Peripheral vascular disease, unspecified: Secondary | ICD-10-CM | POA: Diagnosis present

## 2015-07-07 DIAGNOSIS — D509 Iron deficiency anemia, unspecified: Secondary | ICD-10-CM | POA: Diagnosis present

## 2015-07-07 DIAGNOSIS — N319 Neuromuscular dysfunction of bladder, unspecified: Secondary | ICD-10-CM | POA: Diagnosis present

## 2015-07-07 DIAGNOSIS — Z87440 Personal history of urinary (tract) infections: Secondary | ICD-10-CM

## 2015-07-07 DIAGNOSIS — L299 Pruritus, unspecified: Secondary | ICD-10-CM | POA: Diagnosis present

## 2015-07-07 DIAGNOSIS — I10 Essential (primary) hypertension: Secondary | ICD-10-CM | POA: Diagnosis present

## 2015-07-07 DIAGNOSIS — L97509 Non-pressure chronic ulcer of other part of unspecified foot with unspecified severity: Secondary | ICD-10-CM

## 2015-07-07 DIAGNOSIS — C220 Liver cell carcinoma: Principal | ICD-10-CM | POA: Diagnosis present

## 2015-07-07 DIAGNOSIS — L8990 Pressure ulcer of unspecified site, unspecified stage: Secondary | ICD-10-CM | POA: Insufficient documentation

## 2015-07-07 DIAGNOSIS — N400 Enlarged prostate without lower urinary tract symptoms: Secondary | ICD-10-CM | POA: Diagnosis present

## 2015-07-07 DIAGNOSIS — Z87891 Personal history of nicotine dependence: Secondary | ICD-10-CM

## 2015-07-07 DIAGNOSIS — D573 Sickle-cell trait: Secondary | ICD-10-CM | POA: Diagnosis present

## 2015-07-07 DIAGNOSIS — Z7984 Long term (current) use of oral hypoglycemic drugs: Secondary | ICD-10-CM

## 2015-07-07 DIAGNOSIS — Z888 Allergy status to other drugs, medicaments and biological substances status: Secondary | ICD-10-CM

## 2015-07-07 DIAGNOSIS — C787 Secondary malignant neoplasm of liver and intrahepatic bile duct: Secondary | ICD-10-CM | POA: Insufficient documentation

## 2015-07-07 DIAGNOSIS — Z23 Encounter for immunization: Secondary | ICD-10-CM

## 2015-07-07 DIAGNOSIS — Z89512 Acquired absence of left leg below knee: Secondary | ICD-10-CM

## 2015-07-07 DIAGNOSIS — R1011 Right upper quadrant pain: Secondary | ICD-10-CM

## 2015-07-07 DIAGNOSIS — R17 Unspecified jaundice: Secondary | ICD-10-CM | POA: Insufficient documentation

## 2015-07-07 DIAGNOSIS — Z8249 Family history of ischemic heart disease and other diseases of the circulatory system: Secondary | ICD-10-CM

## 2015-07-07 DIAGNOSIS — R160 Hepatomegaly, not elsewhere classified: Secondary | ICD-10-CM | POA: Insufficient documentation

## 2015-07-07 DIAGNOSIS — C221 Intrahepatic bile duct carcinoma: Secondary | ICD-10-CM | POA: Diagnosis present

## 2015-07-07 DIAGNOSIS — C799 Secondary malignant neoplasm of unspecified site: Secondary | ICD-10-CM | POA: Insufficient documentation

## 2015-07-07 DIAGNOSIS — E119 Type 2 diabetes mellitus without complications: Secondary | ICD-10-CM

## 2015-07-07 DIAGNOSIS — E11621 Type 2 diabetes mellitus with foot ulcer: Secondary | ICD-10-CM | POA: Diagnosis present

## 2015-07-07 DIAGNOSIS — Z833 Family history of diabetes mellitus: Secondary | ICD-10-CM

## 2015-07-07 DIAGNOSIS — I4892 Unspecified atrial flutter: Secondary | ICD-10-CM | POA: Diagnosis present

## 2015-07-07 DIAGNOSIS — Z7982 Long term (current) use of aspirin: Secondary | ICD-10-CM

## 2015-07-07 DIAGNOSIS — Z91018 Allergy to other foods: Secondary | ICD-10-CM

## 2015-07-07 DIAGNOSIS — K831 Obstruction of bile duct: Secondary | ICD-10-CM | POA: Diagnosis present

## 2015-07-07 DIAGNOSIS — Z515 Encounter for palliative care: Secondary | ICD-10-CM | POA: Insufficient documentation

## 2015-07-07 LAB — COMPREHENSIVE METABOLIC PANEL
ALT: 66 U/L — AB (ref 17–63)
AST: 133 U/L — AB (ref 15–41)
Albumin: 2.9 g/dL — ABNORMAL LOW (ref 3.5–5.0)
Alkaline Phosphatase: 385 U/L — ABNORMAL HIGH (ref 38–126)
Anion gap: 13 (ref 5–15)
BUN: 13 mg/dL (ref 6–20)
CHLORIDE: 104 mmol/L (ref 101–111)
CO2: 19 mmol/L — AB (ref 22–32)
CREATININE: 0.81 mg/dL (ref 0.61–1.24)
Calcium: 9.8 mg/dL (ref 8.9–10.3)
GFR calc Af Amer: >60 mL/min (ref >60)
GLUCOSE: 72 mg/dL (ref 65–99)
Potassium: 4.1 mmol/L (ref 3.5–5.1)
Sodium: 136 mmol/L (ref 135–145)
Total Bilirubin: 21.1 mg/dL (ref 0.3–1.2)
Total Protein: 8 g/dL (ref 6.5–8.1)

## 2015-07-07 LAB — CBC
HCT: 28.9 % — ABNORMAL LOW (ref 39.0–52.0)
Hemoglobin: 10.1 g/dL — ABNORMAL LOW (ref 13.0–17.0)
MCH: 23.6 pg — AB (ref 26.0–34.0)
MCHC: 34.9 g/dL (ref 30.0–36.0)
MCV: 67.5 fL — AB (ref 78.0–100.0)
PLATELETS: 513 10*3/uL — AB (ref 150–400)
RBC: 4.28 MIL/uL (ref 4.22–5.81)
RDW: 17.6 % — AB (ref 11.5–15.5)
WBC: 9.4 10*3/uL (ref 4.0–10.5)

## 2015-07-07 LAB — URINALYSIS, ROUTINE W REFLEX MICROSCOPIC
GLUCOSE, UA: NEGATIVE mg/dL
KETONES UR: 15 mg/dL — AB
Nitrite: NEGATIVE
PROTEIN: 100 mg/dL — AB
Specific Gravity, Urine: 1.017 (ref 1.005–1.030)
pH: 5.5 (ref 5.0–8.0)

## 2015-07-07 LAB — URINE MICROSCOPIC-ADD ON

## 2015-07-07 LAB — GLUCOSE, CAPILLARY
GLUCOSE-CAPILLARY: 51 mg/dL — AB (ref 65–99)
GLUCOSE-CAPILLARY: 79 mg/dL (ref 65–99)

## 2015-07-07 LAB — LIPASE, BLOOD: LIPASE: 23 U/L (ref 11–51)

## 2015-07-07 LAB — LACTATE DEHYDROGENASE: LDH: 265 U/L — AB (ref 98–192)

## 2015-07-07 LAB — PROTIME-INR
INR: 1.11 (ref 0.00–1.49)
Prothrombin Time: 14.5 seconds (ref 11.6–15.2)

## 2015-07-07 MED ORDER — GABAPENTIN 100 MG PO CAPS
100.0000 mg | ORAL_CAPSULE | Freq: Three times a day (TID) | ORAL | Status: DC
Start: 2015-07-07 — End: 2015-07-14
  Administered 2015-07-08 – 2015-07-14 (×20): 100 mg via ORAL
  Filled 2015-07-07 (×20): qty 1

## 2015-07-07 MED ORDER — ONDANSETRON HCL 4 MG/2ML IJ SOLN: 4.0000 mg | Freq: Four times a day (QID) | INTRAMUSCULAR | Status: DC | PRN

## 2015-07-07 MED ORDER — DEXTROSE 5 % IV SOLN
1.0000 g | Freq: Once | INTRAVENOUS | Status: AC
  Administered 2015-07-07: 1 g via INTRAVENOUS
  Filled 2015-07-07: qty 10

## 2015-07-07 MED ORDER — ACETAMINOPHEN 650 MG RE SUPP: 650.0000 mg | Freq: Four times a day (QID) | RECTAL | Status: DC | PRN

## 2015-07-07 MED ORDER — ACETAMINOPHEN 325 MG PO TABS: 650.0000 mg | ORAL_TABLET | Freq: Four times a day (QID) | ORAL | Status: DC | PRN

## 2015-07-07 MED ORDER — ONDANSETRON HCL 4 MG PO TABS
4.0000 mg | ORAL_TABLET | Freq: Four times a day (QID) | ORAL | Status: DC | PRN
  Administered 2015-07-08: 4 mg via ORAL
  Filled 2015-07-07: qty 1

## 2015-07-07 MED ORDER — ASPIRIN EC 81 MG PO TBEC
81.0000 mg | DELAYED_RELEASE_TABLET | Freq: Every day | ORAL | Status: DC
  Administered 2015-07-08 – 2015-07-17 (×10): 81 mg via ORAL
  Filled 2015-07-07 (×10): qty 1

## 2015-07-07 MED ORDER — SODIUM CHLORIDE 0.9 % IV BOLUS (SEPSIS)
1000.0000 mL | Freq: Once | INTRAVENOUS | Status: AC
  Administered 2015-07-07: 1000 mL via INTRAVENOUS

## 2015-07-07 MED ORDER — INSULIN ASPART 100 UNIT/ML ~~LOC~~ SOLN
0.0000 [IU] | Freq: Three times a day (TID) | SUBCUTANEOUS | Status: DC
  Administered 2015-07-08: 1 [IU] via SUBCUTANEOUS
  Administered 2015-07-08: 3 [IU] via SUBCUTANEOUS
  Administered 2015-07-09: 1 [IU] via SUBCUTANEOUS
  Administered 2015-07-09 – 2015-07-10 (×3): 2 [IU] via SUBCUTANEOUS
  Administered 2015-07-10 – 2015-07-11 (×2): 3 [IU] via SUBCUTANEOUS
  Administered 2015-07-11 – 2015-07-12 (×3): 2 [IU] via SUBCUTANEOUS
  Administered 2015-07-12: 1 [IU] via SUBCUTANEOUS
  Administered 2015-07-13 – 2015-07-14 (×6): 2 [IU] via SUBCUTANEOUS
  Administered 2015-07-15: 3 [IU] via SUBCUTANEOUS
  Administered 2015-07-15: 2 [IU] via SUBCUTANEOUS
  Administered 2015-07-15: 3 [IU] via SUBCUTANEOUS
  Administered 2015-07-16: 5 [IU] via SUBCUTANEOUS
  Administered 2015-07-16: 3 [IU] via SUBCUTANEOUS
  Administered 2015-07-16 – 2015-07-17 (×2): 2 [IU] via SUBCUTANEOUS
  Administered 2015-07-17 (×2): 3 [IU] via SUBCUTANEOUS

## 2015-07-07 MED ORDER — ENOXAPARIN SODIUM 40 MG/0.4ML ~~LOC~~ SOLN
40.0000 mg | Freq: Every day | SUBCUTANEOUS | Status: DC
  Administered 2015-07-08: 40 mg via SUBCUTANEOUS
  Filled 2015-07-07 (×2): qty 0.4

## 2015-07-07 MED ORDER — LISINOPRIL 40 MG PO TABS
40.0000 mg | ORAL_TABLET | Freq: Every day | ORAL | Status: DC
  Administered 2015-07-08 – 2015-07-17 (×10): 40 mg via ORAL
  Filled 2015-07-07 (×10): qty 1
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
## 2015-07-07 NOTE — ED Provider Notes (Signed)
CSN: [id number]     Arrival date & time 07/07/15  1351 History   First MD Initiated Contact with Patient 07/07/15 1812     Chief Complaint  Patient presents with  . Hematuria  . Jaundice     (Consider location/radiation/quality/duration/timing/severity/associated sxs/prior Treatment) Patient is a 55 y.o. male presenting with hematuria. The history is provided by the patient.  Hematuria This is a new problem. Episode onset: 4 days. The problem has been gradually worsening. Associated symptoms include abdominal pain (intermittent RLQ pain), chills, fatigue and nausea. Pertinent negatives include no chest pain, congestion, coughing, diaphoresis, fever, headaches, rash, sore throat, urinary symptoms, vomiting or weakness. Nothing aggravates the symptoms. He has tried nothing for the symptoms. The treatment provided no relief.    Past Medical History  Diagnosis Date  . Sickle cell trait ([place])   . Normal nuclear stress test 07/2010    low prob of ischemia, EF 42%  . Echocardiogram abnormal     moderate LVH, mild LV hypokenesis, EF 42%  . History of Doppler ultrasound 07/2010    negative for DVT  . Abnormal CT of the abdomen     mild L hydronephrosis and hydroureter  . Status post radiofrequency ablation for arrhythmia 10/16/10    [facility] Rucks MD)  . Diabetes mellitus   . Hypertension   . Osteomyelitis ([place]) 12/2014    LEFT FOOT  . Microcytic anemia 02/25/2015  . 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 Followed at [facility] urology.    Past Surgical History  Procedure Laterality Date  . Radiofrequency ablation  10/16/10    Cardiac for atrial arrythmia, [facility]  . Dental extractions  10/15/10    prior to ablation  . Patella fracture surgery      metal rod left leg  . Tonsillectomy    . Rotator cuff repair Left 10/2014  . Amputation Left 12/31/2014    Procedure: AMPUTATION BELOW KNEE;  Surgeon: Newt Minion, MD;  Location: [place];  Service: Orthopedics;   Laterality: Left;  . Esophagogastroduodenoscopy (egd) with propofol N/A 03/02/2015    Procedure: ESOPHAGOGASTRODUODENOSCOPY (EGD) WITH PROPOFOL;  Surgeon: Wonda Horner, MD;  Location: [facility] ENDOSCOPY;  Service: Endoscopy;  Laterality: N/A;  . Colonoscopy N/A 03/03/2015    Procedure: COLONOSCOPY;  Surgeon: Wonda Horner, MD;  Location: [facility] ENDOSCOPY;  Service: Endoscopy;  Laterality: N/A;   Family History  Problem Relation Age of Onset  . Sickle cell trait    . Heart failure Father   . Diabetes Father   . Diabetes Mother   . Hypertension Mother    Social History  Substance Use Topics  . Smoking status: Former Smoker -- 1.50 packs/day for 17 years    Types: Cigars    Quit date: 12/15/2014  . Smokeless tobacco: Never Used     Comment: 2 cigars  . Alcohol Use: No    Review of Systems  Constitutional: Positive for chills and fatigue. Negative for fever, diaphoresis and appetite change.  HENT: Negative for congestion, ear pain, facial swelling, mouth sores and sore throat.   Eyes: Negative for visual disturbance.  Respiratory: Negative for cough, chest tightness and shortness of breath.   Cardiovascular: Negative for chest pain and palpitations.  Gastrointestinal: Positive for nausea, abdominal pain (intermittent RLQ pain) and diarrhea (for 6 days). Negative for vomiting and blood in stool.  Endocrine: Negative for cold intolerance and heat intolerance.  Genitourinary: Positive for hematuria. Negative for frequency, decreased urine  volume and difficulty urinating.  Musculoskeletal: Negative for back pain and neck stiffness.  Skin: Negative for rash.  Neurological: Negative for dizziness, weakness, light-headedness and headaches.  All other systems reviewed and are negative.     Allergies  Infed and Lactose intolerance (gi)  Home Medications   Prior to Admission medications   Medication Sig Start Date End Date Taking? Authorizing Provider  aspirin EC 81 MG tablet Take 1 tablet (81  mg total) by mouth daily. 01/03/15  Yes Alyssa A Haney, MD  diphenhydramine-acetaminophen (TYLENOL PM) 25-500 MG TABS Take 2 tablets by mouth at bedtime.   Yes Historical Provider, MD  ENSURE (ENSURE) Take 237 mLs by mouth 2 (two) times daily between meals.   Yes Historical Provider, MD  Ferrous Sulfate (IRON) 325 (65 FE) MG TABS Take 325 mg by mouth once. 03/03/15  Yes Asiyah Cletis Media, MD  gabapentin (NEURONTIN) 100 MG capsule TAKE ONE CAPSULE BY MOUTH THREE TIMES DAILY 05/24/15  Yes Leone Brand, MD  glipiZIDE (GLUCOTROL) 10 MG tablet TAKE ONE TABLET BY MOUTH TWICE DAILY BEFORE MEAL(S) 09/07/14  Yes Leone Brand, MD  lisinopril (PRINIVIL,ZESTRIL) 40 MG tablet TAKE ONE TABLET BY MOUTH ONCE DAILY 03/31/15  Yes Leone Brand, MD  metFORMIN (GLUCOPHAGE) 1000 MG tablet Take 1 tablet (1,000 mg total) by mouth 2 (two) times daily with a meal. 09/07/14  Yes Leone Brand, MD  naproxen sodium (ANAPROX) 220 MG tablet Take 440 mg by mouth daily as needed (general pain).   Yes Historical Provider, MD   BP 158/83 mmHg  Pulse 68  Temp(Src) 98 F (36.7 C) (Oral)  Resp 23  Ht 6\' 7"  (2.007 m)  Wt 121.11 kg  BMI 30.07 kg/m2  SpO2 100% Physical Exam  Constitutional: He is oriented to person, place, and time. He appears well-nourished. No distress.  HENT:  Head: Normocephalic and atraumatic.  Right Ear: External ear normal.  Left Ear: External ear normal.  Eyes: Pupils are equal, round, and reactive to light. Right eye exhibits no discharge. Left eye exhibits no discharge. Scleral icterus is present.  Neck: Normal range of motion. Neck supple.  Cardiovascular: Normal rate.  Exam reveals no gallop and no friction rub.   No murmur heard. Pulmonary/Chest: Effort normal and breath sounds normal. No stridor. No respiratory distress. He has no wheezes. He has no rales. He exhibits no tenderness.  Abdominal: Soft. He exhibits no distension and no mass. There is no tenderness. There is no rebound and no  guarding.  Musculoskeletal: He exhibits no edema or tenderness.  Neurological: He is alert and oriented to person, place, and time.  Skin: Skin is warm and dry. No rash noted. He is not diaphoretic. No erythema.    ED Course  Procedures (including critical care time) Labs Review Labs Reviewed  COMPREHENSIVE METABOLIC PANEL - Abnormal; Notable for the following:    CO2 19 (*)    Albumin 2.9 (*)    AST 133 (*)    ALT 66 (*)    Alkaline Phosphatase 385 (*)    Total Bilirubin 21.1 (*)    All other components within normal limits  CBC - Abnormal; Notable for the following:    Hemoglobin 10.1 (*)    HCT 28.9 (*)    MCV 67.5 (*)    MCH 23.6 (*)    RDW 17.6 (*)    Platelets 513 (*)    All other components within normal limits  URINALYSIS, ROUTINE W REFLEX MICROSCOPIC (NOT  AT [facility]) - Abnormal; Notable for the following:    Color, Urine AMBER (*)    APPearance CLOUDY (*)    Hgb urine dipstick LARGE (*)    Bilirubin Urine LARGE (*)    Ketones, ur 15 (*)    Protein, ur 100 (*)    Leukocytes, UA LARGE (*)    All other components within normal limits  URINE MICROSCOPIC-ADD ON - Abnormal; Notable for the following:    Squamous Epithelial / LPF 0-5 (*)    Bacteria, UA MANY (*)    Casts GRANULAR CAST (*)    All other components within normal limits  LACTATE DEHYDROGENASE - Abnormal; Notable for the following:    LDH 265 (*)    All other components within normal limits  GLUCOSE, CAPILLARY - Abnormal; Notable for the following:    Glucose-Capillary 51 (*)    All other components within normal limits  URINE CULTURE  LIPASE, BLOOD  PROTIME-INR  GLUCOSE, CAPILLARY  HAPTOGLOBIN  HEPATITIS PANEL, ACUTE  COMPREHENSIVE METABOLIC PANEL  CBC    Imaging Review Ct Abdomen Pelvis W Wo Contrast  07/08/2015  CLINICAL DATA:  Liver mass. EXAM: CT ABDOMEN AND PELVIS WITHOUT AND WITH CONTRAST TECHNIQUE: Multidetector CT imaging of the abdomen and pelvis was performed following the standard  protocol before and following the bolus administration of intravenous contrast. CONTRAST:  100 cc Omnipaque 300 intravenous COMPARISON:  Sonography from yesterday FINDINGS: Lower chest and abdominal wall:  No contributory findings. Hepatobiliary: Innumerable hypo enhancing masses throughout the liver consistent with metastatic disease. There is a confluent left liver mass which measures 9 cm. Intrahepatic bile duct dilatation. The left portal venous system is not clearly identified but no visible tumor thrombus. Deep liver drainage lymphadenopathy with portacaval node measuring 26 mm. Low-density right periaortic node at the level of the right kidney. The liver surface is nodular, but mainly in areas where there is distortion by mass. Overall no definitive cirrhosis - no fissure enlargement or caudate hypertrophy. Cholelithiasis. Pancreas: No primary is seen. Spleen: Unremarkable. Adrenals/Urinary Tract:  Negative adrenals. Chronic moderate left hydroureteronephrosis to the level of the bladder. Bilateral ureteral insertion into the bladder appears ectopic. Left renal cortical thinning. No mass lesion is seen. Full urinary bladder Reproductive:No pathologic findings. Stomach/Bowel:  No primary lesion is seen. No inflammation. Vascular/Lymphatic: No acute vascular abnormality. No mass or adenopathy. Peritoneal: No ascites or pneumoperitoneum. Musculoskeletal: No aggressive lytic or blastic lesion. 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 IMPRESSION: 1. Numerous hepatic metastases with confluent 9 cm left hepatic mass. No extrahepatic intra-abdominal primary is identified. If chest imaging is negative question hepatocellular carcinoma or central cholangiocarcinoma. Intra-abdominal nodal metastases. 2. Dilated bladder correlating with history of neurogenic bladder. 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 Electronically Signed   By:  Monte Fantasia M.D.   On: 07/08/2015 01:10   US Abdomen Limited Ruq  07/07/2015  CLINICAL DATA:  Elevated bilirubin, jaundice EXAM: US ABDOMEN LIMITED - RIGHT UPPER QUADRANT COMPARISON:  None. FINDINGS: Gallbladder: Contracted gallbladder. Secondary gallbladder wall thickening. Possible 10 mm layering gallstone. Negative sonographic Murphy's sign. Common bile duct: Dilated common duct, measuring up to 12 mm. Liver: Heterogeneous hepatic parenchyma. 3.5 x 2.5 x 2.9 cm hypoechoic mass in the left hepatic lobe. Possible central intrahepatic ductal dilatation. IMPRESSION: 3.5 cm hypoechoic mass in the left hepatic lobe, suspicious for metastasis. Dilated common duct, measuring 12 mm. Possible central intrahepatic ductal dilatation. Given the presence of a hepatic mass,  this appearance is worrisome for an obstructing lesion. Contracted gallbladder with cholelithiasis. No associated sonographic findings to suggest acute cholecystitis. CT abdomen pelvis with/without contrast (liver protocol) is suggested for initial evaluation. Electronically Signed   By: Julian Hy M.D.   On: 07/07/2015 20:26   I have personally reviewed and evaluated these images and lab results as part of my medical decision-making.   EKG Interpretation None      MDM   Painless jaundice. Presentation concerning for neoplastic process. Patient does have a one-week history of diarrhea. Hepatitis panel sent. Labs to rule out hemolysis sent. Patient denies alcohol consumption. He does take nightly acetaminophen however low concern for toxicity.   Labs with elevated LFTs and bilirubin of 21.1. Ultrasound right upper quadrant obtained, still pending.  UA concerning for urinary tract infection. Patient given a single dose of Rocephin. Admitting team will continue if needed.  She will require admission for continued workup and management.  Diagnostic studies interpreted by me and use to my clinical decision-making.  He was seen  in conjunction with Dr. Kathrynn Humble    Final diagnoses:  Jaundice        Addison Lank, MD 07/08/15 0151  Varney Biles, MD 07/08/15 [phone]

## 2015-07-07 NOTE — ED Notes (Signed)
Notified Ultrasound that pt is ready for transport. Ultrasound states wait will be approx 45 mins.

## 2015-07-07 NOTE — ED Notes (Signed)
Notified Dr Rex Kras of critical bilirubin 21.1.

## 2015-07-07 NOTE — ED Notes (Signed)
Pt from home with c/o nausea, RLQ pain, dark "purple" urine, and yellow eyes starting this past Monday.  Pt reports he has generalized "not feeling well."  Denies emesis.  Reports diarrhea on Monday and Tuesday but has treated at home with relief.  NAD, A&O.

## 2015-07-08 ENCOUNTER — Encounter (HOSPITAL_COMMUNITY): Payer: Self-pay | Admitting: Radiology

## 2015-07-08 ENCOUNTER — Inpatient Hospital Stay (HOSPITAL_COMMUNITY): Payer: Medicaid Other

## 2015-07-08 DIAGNOSIS — R160 Hepatomegaly, not elsewhere classified: Secondary | ICD-10-CM | POA: Insufficient documentation

## 2015-07-08 DIAGNOSIS — R17 Unspecified jaundice: Secondary | ICD-10-CM | POA: Insufficient documentation

## 2015-07-08 DIAGNOSIS — C799 Secondary malignant neoplasm of unspecified site: Secondary | ICD-10-CM | POA: Insufficient documentation

## 2015-07-08 DIAGNOSIS — L8990 Pressure ulcer of unspecified site, unspecified stage: Secondary | ICD-10-CM | POA: Insufficient documentation

## 2015-07-08 LAB — COMPREHENSIVE METABOLIC PANEL
ALK PHOS: 323 U/L — AB (ref 38–126)
ALT: 59 U/L (ref 17–63)
ANION GAP: 11 (ref 5–15)
AST: 112 U/L — ABNORMAL HIGH (ref 15–41)
Albumin: 2.4 g/dL — ABNORMAL LOW (ref 3.5–5.0)
BILIRUBIN TOTAL: 18.9 mg/dL — AB (ref 0.3–1.2)
BUN: 9 mg/dL (ref 6–20)
CO2: 19 mmol/L — ABNORMAL LOW (ref 22–32)
Calcium: 9.4 mg/dL (ref 8.9–10.3)
Chloride: 108 mmol/L (ref 101–111)
Creatinine, Ser: 0.74 mg/dL (ref 0.61–1.24)
Glucose, Bld: 97 mg/dL (ref 65–99)
Potassium: 3.4 mmol/L — ABNORMAL LOW (ref 3.5–5.1)
Sodium: 138 mmol/L (ref 135–145)
TOTAL PROTEIN: 6.8 g/dL (ref 6.5–8.1)

## 2015-07-08 LAB — CBC
HEMATOCRIT: 26 % — AB (ref 39.0–52.0)
HEMOGLOBIN: 9.3 g/dL — AB (ref 13.0–17.0)
MCH: 24.3 pg — ABNORMAL LOW (ref 26.0–34.0)
MCHC: 35.8 g/dL (ref 30.0–36.0)
MCV: 67.9 fL — AB (ref 78.0–100.0)
Platelets: 505 10*3/uL — ABNORMAL HIGH (ref 150–400)
RBC: 3.83 MIL/uL — AB (ref 4.22–5.81)
RDW: 17.8 % — AB (ref 11.5–15.5)
WBC: 9.5 10*3/uL (ref 4.0–10.5)

## 2015-07-08 LAB — GLUCOSE, CAPILLARY
GLUCOSE-CAPILLARY: 147 mg/dL — AB (ref 65–99)
GLUCOSE-CAPILLARY: 88 mg/dL (ref 65–99)
GLUCOSE-CAPILLARY: 139 mg/dL — AB (ref 65–99)
GLUCOSE-CAPILLARY: 210 mg/dL — AB (ref 65–99)
Glucose-Capillary: 137 mg/dL — ABNORMAL HIGH (ref 65–99)

## 2015-07-08 LAB — HEPATITIS PANEL, ACUTE
HCV AB: 0.1 {s_co_ratio} (ref 0.0–0.9)
HEP A IGM: NEGATIVE
HEP B C IGM: NEGATIVE
Hepatitis B Surface Ag: NEGATIVE

## 2015-07-08 LAB — HAPTOGLOBIN: Haptoglobin: 290 mg/dL — ABNORMAL HIGH (ref 34–200)

## 2015-07-08 LAB — AFP TUMOR MARKER: AFP-Tumor Marker: 1472 ng/mL — ABNORMAL HIGH (ref 0.0–8.3)

## 2015-07-08 MED ORDER — PNEUMOCOCCAL VAC POLYVALENT 25 MCG/0.5ML IJ INJ
0.5000 mL | INJECTION | Freq: Once | INTRAMUSCULAR | Status: DC
  Filled 2015-07-08: qty 0.5

## 2015-07-08 MED ORDER — ENSURE ENLIVE PO LIQD
237.0000 mL | Freq: Two times a day (BID) | ORAL | Status: DC
  Administered 2015-07-08 – 2015-07-14 (×5): 237 mL via ORAL

## 2015-07-08 MED ORDER — INFLUENZA VAC SPLIT QUAD 0.5 ML IM SUSY
0.5000 mL | PREFILLED_SYRINGE | Freq: Once | INTRAMUSCULAR | Status: DC
  Filled 2015-07-08 (×2): qty 0.5

## 2015-07-08 MED ORDER — PROMETHAZINE HCL 25 MG/ML IJ SOLN
12.5000 mg | Freq: Four times a day (QID) | INTRAMUSCULAR | Status: DC | PRN
  Administered 2015-07-08 – 2015-07-10 (×4): 12.5 mg via INTRAVENOUS
  Filled 2015-07-08 (×5): qty 1

## 2015-07-08 MED ORDER — IOHEXOL 300 MG/ML  SOLN
75.0000 mL | Freq: Once | INTRAMUSCULAR | Status: AC | PRN
  Administered 2015-07-08: 75 mL via INTRAVENOUS

## 2015-07-08 MED ORDER — IOHEXOL 300 MG/ML  SOLN: 100.0000 mL | Freq: Once | INTRAMUSCULAR | Status: DC | PRN

## 2015-07-08 MED ORDER — MORPHINE SULFATE (PF) 2 MG/ML IV SOLN
2.0000 mg | INTRAVENOUS | Status: DC | PRN
  Administered 2015-07-08 – 2015-07-10 (×11): 2 mg via INTRAVENOUS
  Filled 2015-07-08 (×11): qty 1

## 2015-07-08 MED ORDER — MORPHINE SULFATE (PF) 2 MG/ML IV SOLN
2.0000 mg | INTRAVENOUS | Status: AC | PRN
  Administered 2015-07-08 (×2): 2 mg via INTRAVENOUS
  Filled 2015-07-08 (×2): qty 1

## 2015-07-08 NOTE — Progress Notes (Signed)
Nutrition Brief Note  Patient identified on the Malnutrition Screening Tool (MST) Report. Patient with some weight loss PTA, but remains obese. PO intake is good. Plans for d/c home today.   Wt Readings from Last 15 Encounters:  07/07/15 267 lb (121.11 kg)  05/18/15 290 lb (131.543 kg)  03/07/15 271 lb (122.925 kg)  03/03/15 273 lb (123.832 kg)  01/01/15 315 lb 14.7 oz (143.3 kg)  12/23/14 316 lb (143.337 kg)  12/19/14 334 lb 3.5 oz (151.6 kg)  12/16/14 321 lb (145.605 kg)  11/10/14 321 lb (145.605 kg)  11/09/14 323 lb (146.512 kg)  09/07/14 313 lb 7 oz (142.174 kg)  08/24/14 315 lb (142.883 kg)  08/05/14 328 lb (148.78 kg)  04/30/14 314 lb (142.429 kg)  03/09/14 312 lb (141.522 kg)    Body mass index is 30.07 kg/(m^2). Patient meets criteria for obesity based on current BMI.   Current diet order is CHO modified, patient is consuming approximately 100% of meals at this time. Labs and medications reviewed.   No nutrition interventions warranted at this time. If nutrition issues arise, please consult RD.   Molli Barrows, RD, LDN, [place] Pager [phone] After Hours Pager [phone]

## 2015-07-08 NOTE — Progress Notes (Signed)
   07/08/15 1840  Clinical Encounter Type  Visited With Patient not available;Health care provider  Visit Type Initial  Referral From Nurse   Chaplain attempted to complete a consult for a patient with a potentially serious diagnosis. Upon arriving to patient's room, patient was beginning dinner and on the phone with his brother. Patient asked if we could come back later. Our department will seek to follow up, and our services are available as needed.   Jeri Lager, Chaplain 07/08/2015 7:54 PM

## 2015-07-08 NOTE — Discharge Summary (Signed)
[facility] Discharge Summary  Patient name: Ray Hall Medical record number: [id number] Date of birth: 10/03/1960 Age: 55 y.o. Gender: male Date of Admission: 07/07/2015  Date of Discharge: 07/17/2015 Admitting Physician: Lupita Dawn, MD  Primary Care Provider: Tawanna Sat, MD Consultants: GI, Oncology, Interventional Radiology  Indication for Hospitalization: Hyperbilirubinemia with Painless Jaundice  Discharge Diagnoses/Problem List:  Multiple Liver Masses, likely primary hepatic mass Intrahepatic Cholestasis with HyperBili, secondary to liver masses Neurogenic Bladder, s/p Spinal Cord Injury, with h/o recurrent UTI Right 2nd toe healing DM ulcer on dorsal surface Type 2 DM PVD, H/o Left BKA Chronic anemia, microcytic HTN  Disposition: Home  Discharge Condition: Stable  Discharge Exam:  Blood pressure 104/68, pulse 75, temperature 98.2 F (36.8 C), temperature source Oral, resp. rate 18, height 6' 7"  (2.007 m), weight 276 lb (125.193 kg), SpO2 98 %. General: obese, chronically ill but currently well appearing, resting comfortably, in NAD when at rest HEENT: stable scleral icterus, EOMI, OP wnl, MMM Cardiovascular: RRR, no m/r/g Respiratory: normal effort on room air, CTAB Abdomen: Minimal tenderness to palpation at RUQ & RLQ; soft without rebound or tenderness over left abdomen, + BS MSK: Left BKA with well healed scar, Right LE: superficial wound on 2nd toe without signs of infection (1x1cm) though no sensation over toes Skin: wound as above, warm and dry, no rashes Psych: mood stable, affect appropriate, pleasant  Brief Hospital Course:  Ray Hall is a 55 yr male who presented to the ED following 5 days of scleral icterus and tea colored urine, also constellation of abdominal symptoms with diarrhea, cramping, and 3 month history of 45 lb unintentional wt loss. Initial work-up in ED with chemistry showing significantly elevated  T.Bili at 21.1 and elevated LFTs: alk phos 385, AST 133, ALT 66. UA showed large bilirubin and mixed results questionable for UTI vs hematuria. RUQ US showed hypoechoic left liver mass. He was admitted to [facility] for further work-up of painless jaundice with concern of biliary obstruction from GI malignancy.  During hospitalization, patient remained clinically stable, tolerating PO, pain controlled with Morphine. Additional imaging with CT Abd / Pelvis w/ and w/o contrast (liver study) showed multiple liver masses without any other abdominal mass (negative pancreatic or biliary), suggestive of intrahepatic cholestasis and showed mets to intra-abd lymph nodes. GI consulted for further work-up, checked alfa-fetoprotein (elevated to 1472), CT Chest (no intrathoracic or suspected metastasis), and ultimately recommended proceeding with inpatient US-guided liver biopsy for tissue diagnosis prior to establishing with oncology. Oncology recommended MRI, which was significant for numerous hepatobiliary masses, the largest 8.8x9.3cm in the medial segment of the left hepatic lobe, and necrotic upper abdominal/retroperitoneal lymphadenopathy.   On 07/12/15, IR performed percutaneous transhepatic cholangiography with percutaneous biliary drainage. Patient had significant RUQ and RLQ pain after the procedure and had blood clots from his drain, so CT abdomen was performed to rule out hematoma, which was negative. IV morphine was given after biliary drain placement; pain gradually improved with time from procedure. Drain was putting out greenish-yellow fluid at time of discharge. Tbili remained high at 16.3, though improved from admission. Liver masses likely contributing to slow decrease in Tbili despite drain placement.   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 Life expectancy was discussed. Palliative care confirmed goals of care included  pursuing aggressive treatment. Plan was for port-a-cath to be placed in preparation for chemotherapy, but tbili and LFTs were still too high at  time of discharge. Plan for outpatient port-a-cath placement when liver labs improved.   Issues for Follow Up:  1. Check Tbili levels and ask about continued biliary drainage 2. Outpatient port-a-cath placement to be determined by Dr. Marin Olp and IR. 3. Pain medicine requirements and need for refills. Was discharged with morphine 15 mg to take q8h and prn morphine IR 30 mg q6h. 4. Bowel movements on regular pain medicine and possible need to escalate bowel regimen. Was discharged with senna.  5. Would check hemoglobin if patient has continued to have bloody drainage from biliary drain.   Significant Procedures: Biliary drain placement  Significant Labs and Imaging:   Recent Labs Lab 07/15/15 0601 07/16/15 0558 07/17/15 0551  WBC 10.2 9.9 10.8*  HGB 11.1* 9.9* 10.4*  HCT 31.3* 27.8* 29.0*  PLT 429* 348 424*    Recent Labs Lab 07/14/15 0745 07/15/15 0601 07/15/15 1254 07/16/15 0558 07/17/15 0551  NA 134* 133* 134* 134* 133*  K 4.6 4.3 4.9 4.6 4.6  CL 100* 97* 98* 97* 96*  CO2 23 23 23 23 25   GLUCOSE 197* 157* 208* 177* 183*  BUN 12 14 17 19  27*  CREATININE 0.86 0.96 1.18 1.16 1.38*  CALCIUM 9.6 9.8 9.4 9.5 9.2  ALKPHOS 317* 338* 297* 296* 299*  AST 139* 166* 152* 156* 199*  ALT 68* 73* 64* 65* 72*  ALBUMIN 2.4* 2.6* 2.3* 2.2* 2.2*   UA - many bacteria, large bili, large hgb, large leuks, protein 100, RBC 6-30, WBC 6-30 Urine Culture - pending  Hepatitis panel - negative (Hep A Igm, Hep B Surface Ag, Hep B Core Ab, HCV)  Haptoglobin 290 elevated  LDH 265 (elevated)  Cancer antigen 19-9: 285 CEA: 5.4  Imaging/Diagnostic Tests:  Ct Abdomen Pelvis W Wo Contrast  07/08/2015  CLINICAL DATA:  Liver mass. EXAM: CT ABDOMEN AND PELVIS WITHOUT AND WITH CONTRAST TECHNIQUE: Multidetector CT imaging of the abdomen and pelvis was  performed following the standard protocol before and following the bolus administration of intravenous contrast. CONTRAST:  100 cc Omnipaque 300 intravenous COMPARISON:  Sonography from yesterday FINDINGS: Lower chest and abdominal wall:  No contributory findings. Hepatobiliary: Innumerable hypo enhancing masses throughout the liver consistent with metastatic disease. There is a confluent left liver mass which measures 9 cm. Intrahepatic bile duct dilatation. The left portal venous system is not clearly identified but no visible tumor thrombus. Deep liver drainage lymphadenopathy with portacaval node measuring 26 mm. Low-density right periaortic node at the level of the right kidney. The liver surface is nodular, but mainly in areas where there is distortion by mass. Overall no definitive cirrhosis - no fissure enlargement or caudate hypertrophy. Cholelithiasis. Pancreas: No primary is seen. Spleen: Unremarkable. Adrenals/Urinary Tract:  Negative adrenals. Chronic moderate left hydroureteronephrosis to the level of the bladder. Bilateral ureteral insertion into the bladder appears ectopic. Left renal cortical thinning. No mass lesion is seen. Full urinary bladder Reproductive:No pathologic findings. Stomach/Bowel:  No primary lesion is seen. No inflammation. Vascular/Lymphatic: No acute vascular abnormality. No mass or adenopathy. Peritoneal: No ascites or pneumoperitoneum. Musculoskeletal: No aggressive lytic or blastic lesion. 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 IMPRESSION: 1. Numerous hepatic metastases with confluent 9 cm left hepatic mass. No extrahepatic intra-abdominal primary is identified. If chest imaging is negative question hepatocellular carcinoma or central cholangiocarcinoma. Intra-abdominal nodal metastases. 2. Dilated bladder correlating with history of neurogenic bladder. Chronic left hydroureteronephrosis with ectopic appearance of the ureteral  insertion. Electronically Signed   By:  Monte Fantasia M.D.   On: 07/08/2015 01:10   Ct Abdomen Wo Contrast  07/13/2015  CLINICAL DATA:  Right upper quadrant pain. Biliary drain placed yesterday 05/01/2013. History of diabetes, hypertension, sickle cell trait, neurogenic bladder. EXAM: CT ABDOMEN WITHOUT CONTRAST TECHNIQUE: Multidetector CT imaging of the abdomen was performed following the standard protocol without IV contrast. COMPARISON:  07/08/2015 FINDINGS: Lower chest: There is bibasilar atelectasis. Small right middle lobe pulmonary nodule is 4 mm on image 5 of series 3. A second right middle lobe pulmonary nodule is only partially imaged on image 1 of series 3 and measures at least 4 mm. Upper abdomen: Numerous low-attenuation lesions are again identified throughout the liver, better identified on previous contrast-enhanced exam. Largest, confluent mass in the left hepatic lobe is at least 10 cm in diameter. The liver is enlarged by the lesions, measuring 23 cm in craniocaudal length. Percutaneous right-sided biliary drain is in place. There is residual contrast within right lobe biliary ducts. Layering high attenuation material within the gallbladder, consistent with stones or sludge. Presence of the material in the gallbladder demonstrates a thickened gallbladder wall. No focal abnormality identified within the spleen or pancreas. There is bilateral hydronephrosis, left greater than right. There is residual contrast in the collecting system possibly related to urinary stasis. Adrenal glands are normal in appearance. Gastrointestinal tract: The stomach and small bowel loops are normal in appearance. The appendix is well seen and has a normal appearance. Visualized colonic loops are normal in appearance. Pelvis: Only partially imaged. There is a small amount of ascites in the right pericolic gutter. The partially imaged urinary bladder contains contrast from recent CT exam. Retroperitoneum: Enlarged nodes  are identified in the region of porta hepatis. No evidence for aortic aneurysm. Abdominal wall: Gynecomastia is present. 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 No suspicious lytic or blastic lesions are identified. IMPRESSION: 1. Interval placement of biliary drain. 2. Thickened gallbladder wall. 3. Bilateral hydronephrosis, left greater than right. 4. Residual urinary tract contrast, likely related to urinary stasis. 5. Normal appendix. 6. Numerous hepatic lesions. Electronically Signed   By: Nolon Nations M.D.   On: 07/13/2015 09:59   Ct Chest W Contrast  07/09/2015  CLINICAL DATA:  Liver masses. EXAM: CT CHEST WITH CONTRAST TECHNIQUE: Multidetector CT imaging of the chest was performed during intravenous contrast administration. CONTRAST:  88m OMNIPAQUE IOHEXOL 300 MG/ML  SOLN COMPARISON:  None. FINDINGS: THORACIC INLET/BODY WALL: Symmetric mild gynecomastia.  No adenopathy. Symmetric full appearance of the thyroid without nodule. MEDIASTINUM: Normal heart size. No pericardial effusion. No acute vascular abnormality. No adenopathy. LUNG WINDOWS: No suspicious nodule or mass. There are 3 mm nodules in the right lung on 3:31 and 36. 32mnodule in the left lung 3:20. UPPER ABDOMEN: Known multiple liver masses and retroperitoneal adenopathy. OSSEOUS: No suspicious lytic or blastic lesions. Remote and healed left rib fractures. IMPRESSION: 1. No intrathoracic primary or suspected metastasis. 2. Few tiny pulmonary nodules which can be followed. Electronically Signed   By: JoMonte Fantasia.D.   On: 07/09/2015 00:22   Mr Liver W Wo Contrast  07/11/2015  CLINICAL DATA:  Painless jaundice, multiple liver masses EXAM: MRI ABDOMEN WITHOUT AND WITH CONTRAST TECHNIQUE: Multiplanar multisequence MR imaging of the abdomen was performed both before and after the administration of intravenous contrast. CONTRAST:  2014mULTIHANCE GADOBENATE DIMEGLUMINE 529  MG/ML IV SOLN COMPARISON:  CT abdomen pelvis dated 07/08/2015 FINDINGS: Lower chest:  Lung bases are  clear. Hepatobiliary: Dominant 8.8 x 9.3 cm hypoenhancing mass centered in the medial segment left hepatic lobe (series 1302/ image 22). Innumerable additional smaller hypoenhancing metastases in both hepatic lobes. For example: --1.8 cm lesion in the anterior right hepatic dome (series 1302/ image 7) --2.6 cm lesion in the medial segment left hepatic lobe (series 1302/ image 21 --2.3 cm lesion in the lateral segment left hepatic lobe (series 1302, image 2/image 31) --2.9 cm lesion in the lateral segment left hepatic lobe (series 1302, image 2/ image 34) --1.8 cm lesion inferiorly in the posterior segment right hepatic lobe (series 1302/ image 52) Gallbladder is notable for excretory contrast. No intrahepatic or extrahepatic ductal dilatation. Pancreas: Within normal limits. Spleen: Within normal limits. Adrenals/Urinary Tract: Adrenal glands are within normal limits. Right kidney is within normal limits. Moderate left hydroureteronephrosis, unchanged. Stomach/Bowel: Stomach is within normal limits. Visualized bowel is unremarkable. Vascular/Lymphatic: No evidence of abdominal aortic aneurysm. Portal vein is patent. Necrotic upper abdominal/retroperitoneal lymphadenopathy, including: --1.6 cm short axis peripancreatic node (series 1303/image 23) --1.9 cm short axis node in the porta hepatis (series 1303/ image 29) --1.7 cm short axis node in the porta hepatis (series 1303/ image 36) --2.3 cm short axis portacaval node (series 1303/ image 40) --1.7 cm short axis retrocaval node (series 1303/image 53) Other: No abdominal ascites. Musculoskeletal: No focal osseous lesions. IMPRESSION: Dominant 9.3 cm mass centered in the medial segment left hepatic lobe. Innumerable additional smaller hypotensive metastases in both hepatic lobes. Necrotic upper abdominal/retroperitoneal lymphadenopathy, as above. Correlate with biopsy  results. 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 Electronically Signed   By: Julian Hy M.D.   On: 07/11/2015 09:21   US Biopsy  07/10/2015  INDICATION: 55 year old male with a history of liver lesion, suspicious for carcinoma. EXAM: ULTRASOUND BIOPSY CORE LIVER COMPARISON:  CT 07/08/2015 MEDICATIONS: None. ANESTHESIA/SEDATION: Moderate (conscious) sedation was employed during this procedure. A total of Versed 1.5 mg and Fentanyl 50 mcg was administered intravenously. Moderate Sedation Time: 10 minutes. The patient's level of consciousness and vital signs were monitored continuously by radiology nursing throughout the procedure under my direct supervision. FLUOROSCOPY TIME:  None COMPLICATIONS: None. PROCEDURE: Informed written consent was obtained from the patient after a thorough discussion of the procedural risks, benefits and alternatives. All questions were addressed. Maximal Sterile Barrier Technique was utilized including caps, mask, sterile gowns, sterile gloves, sterile drape, hand hygiene and skin antiseptic. A timeout was performed prior to the initiation of the procedure. Patient was positioned supine position on the gantry table and a scout ultrasound of the upper abdomen was performed for planning purposes. The patient was then prepped and draped in the usual sterile fashion. Using ultrasound guidance, the skin and subcutaneous tissues were generously infiltrated 1% lidocaine for local anesthesia. 1% lidocaine was used to anesthetize the liver capsule. A small stab incision was made with 11 blade scalpel common using ultrasound guidance, a 17 gauge guide needle was advanced into the lesion targeted for biopsy. Stylet was removed and 3 separate 18 gauge core biopsy were achieved. Two Gel-Foam pledgets were then infused with a small amount of saline. Needle was removed. Final image was stored. The patient tolerated the  procedure well and remained hemodynamically stable throughout. No complications were encountered and no significant blood loss. IMPRESSION: Status post ultrasound-guided biopsy of liver lesion. Tissue specimen sent to pathology for complete histopathologic analysis. Signed, Dulcy Fanny. Earleen Newport, DO [facility] Electronically Signed  By: Corrie Mckusick D.O.   On: 07/10/2015 10:20   Ir Int Lianne Cure Biliary Drain With Cholangiogram  07/12/2015  INDICATION: Large an multifocal hepatic mass is centered in the left lobe and central liver causing biliary obstruction. This presumably represents cholangiocarcinoma. Final results of a percutaneous biopsy performed on 07/10/2015 are pending. The patient requires percutaneous biliary drainage. EXAM: PERCUTANEOUS INTERNAL/EXTERNAL BILIARY DRAINAGE CATHETER PLACEMENT WITH CHOLANGIOGRAM COMPARISON:  MRI of the abdomen on 07/11/2015 and CT of the abdomen on 07/08/2015. MEDICATIONS: 3.375 g IV Zosyn CONTRAST:  32m OMNIPAQUE IOHEXOL 300 MG/ML  SOLN ANESTHESIA/SEDATION: 8.0 mg IV Versed, 200 mcg IV fentanyl Total Moderate Sedation Time 75 minutes. The patient's level of consciousness and physiologic status were continuously monitored during the procedure by Radiology nursing. FLUOROSCOPY TIME:  11 minutes. COMPLICATIONS: None TECHNIQUE: Informed written consent was obtained from [place] discussion of the risks, benefits and alternatives to treatment. Questions regarding the procedure were encouraged and answered. A timeout was performed prior to the initiation of the procedure. The right upper abdominal quadrant was prepped and draped in the usual sterile fashion, and a sterile drape was applied covering the operative field. Maximum barrier sterile technique with sterile gowns and gloves were used for the procedure. A timeout was performed prior to the initiation of the procedure. Ultrasound of the liver was  performed to delineate bile ducts in the right lobe of the liver. After the overlying soft tissues were anesthetized with 1% Lidocaine, a 21 gauge Chiba needle was utilized to opacify the peripheral aspect of an anterior right hepatic duct. Cholangiogram was performed. Guidewire was advanced. Additional percutaneous puncture was then performed under fluoroscopic guidance of a right intrahepatic bile duct with a 21 gauge needle. A guidewire was advanced through the needle. A transitional micropuncture dilator was then advanced. Over a guidewire, a 5 French catheter was advanced into the central bile ducts. This catheter was further advanced into the common bile duct and duodenum over a hydrophilic guidewire. Over an Amplatz stiff wire, the tract was dilated and a 10.2 [place] drainage catheter was advanced with coil ultimately locked within the duodenum. Contrast was injected and a completion radiograph was obtained. The catheter was connected to a drainage bag which yielded the brisk return of clear bile. The catheter was secured to the skin with a Prolene retention suture and StatLock device. FINDINGS: After right lobe bile duct puncture and contrast injection, cholangiography shows a high-grade central biliary obstruction at the confluence of right lobe and left lobe ducts extending into central ducts. The left lobe ductal system was never opacified and may be completely obstructed by tumor. The level of obstruction was able to be crossed with a guidewire and catheter allowing placement of the internal/external biliary drain down to the level of the duodenum. The distal portion of the catheter was formed in the duodenum with sideholes extending up the common bile duct and into the central right lobe of the liver. IMPRESSION: After right lobe ductal access, cholangiography demonstrates a high-grade central biliary obstruction extending into intrahepatic right lobe ducts. The left lobe ductal system was not  opacified and presumably completely obstructed by tumor. A 10 French internal/external biliary drainage catheter was able to be advanced through the level of obstruction and to the duodenum. This tube will be left to gravity bag drainage. Electronically Signed   By: GAletta EdouardM.D.   On: 07/12/2015 17:26   [place]Abdomen Limited Ruq  07/07/2015  CLINICAL DATA:  Elevated bilirubin, jaundice EXAM: US ABDOMEN LIMITED - RIGHT UPPER QUADRANT COMPARISON:  None. FINDINGS: Gallbladder: Contracted gallbladder. Secondary gallbladder wall thickening. Possible 10 mm layering gallstone. Negative sonographic Murphy's sign. Common bile duct: Dilated common duct, measuring up to 12 mm. Liver: Heterogeneous hepatic parenchyma. 3.5 x 2.5 x 2.9 cm hypoechoic mass in the left hepatic lobe. Possible central intrahepatic ductal dilatation. IMPRESSION: 3.5 cm hypoechoic mass in the left hepatic lobe, suspicious for metastasis. Dilated common duct, measuring 12 mm. Possible central intrahepatic ductal dilatation. Given the presence of a hepatic mass, this appearance is worrisome for an obstructing lesion. Contracted gallbladder with cholelithiasis. No associated sonographic findings to suggest acute cholecystitis. CT abdomen pelvis with/without contrast (liver protocol) is suggested for initial evaluation. Electronically Signed   By: Julian Hy M.D.   On: 07/07/2015 20:26    Results/Tests Pending at Time of Discharge: None  Discharge Medications:    Medication List    STOP taking these medications        diphenhydramine-acetaminophen 25-500 MG Tabs tablet  Commonly known as:  TYLENOL PM     Iron 325 (65 Fe) MG Tabs      TAKE these medications        aspirin EC 81 MG tablet  Take 1 tablet (81 mg total) by mouth daily.     camphor-menthol lotion  Commonly known as:  SARNA  Apply topically as needed for itching.     cholestyramine light 4 g packet  Commonly known as:  PREVALITE  Take 1 packet (4 g total)  by mouth every 8 (eight) hours as needed (itching).     ENSURE  Take 237 mLs by mouth 2 (two) times daily between meals.     feeding supplement (ENSURE ENLIVE) Liqd  Take 237 mLs by mouth 2 (two) times daily between meals.     gabapentin 100 MG capsule  Commonly known as:  NEURONTIN  Please take 100 mg in the morning and mid-day and 300 mg at bedtime.     glipiZIDE 10 MG tablet  Commonly known as:  GLUCOTROL  TAKE ONE TABLET BY MOUTH TWICE DAILY BEFORE MEAL(S)     lisinopril 40 MG tablet  Commonly known as:  PRINIVIL,ZESTRIL  TAKE ONE TABLET BY MOUTH ONCE DAILY     metFORMIN 1000 MG tablet  Commonly known as:  GLUCOPHAGE  Take 1 tablet (1,000 mg total) by mouth 2 (two) times daily with a meal.     morphine 15 MG 12 hr tablet  Commonly known as:  MS CONTIN  Take 1 tablet (15 mg total) by mouth every 8 (eight) hours.     morphine 30 MG tablet  Commonly known as:  MSIR  Take 1 tablet (30 mg total) by mouth every 6 (six) hours as needed for moderate pain.     naproxen sodium 220 MG tablet  Commonly known as:  ANAPROX  Take 440 mg by mouth daily as needed (general pain).     ondansetron 8 MG tablet  Commonly known as:  ZOFRAN  Take 1 tablet (8 mg total) by mouth every 6 (six) hours as needed for nausea.     senna 8.6 MG Tabs tablet  Commonly known as:  SENOKOT  Take 2 tablets (17.2 mg total) by mouth at bedtime.        Discharge Instructions: Please refer to Patient Instructions section of EMR for full details.  Patient was counseled important signs and symptoms that should prompt return to medical care, changes  in medications, dietary instructions, activity restrictions, and follow up appointments.   Follow-Up Appointments: Follow-up Information    Follow up with [facility]iology. Go on 07/27/2015.   Why:  hospital follow-up for biliary drain; please arrive at 10:30 for 10:45 a.m. appointment for CT and doctor's follow-up. Please do not eat 4 hours before the  appointment, but you may have liquids.   Contact information:   [address] [place] [address] [phone]      Follow up with Tawanna Sat, MD. Go on 07/25/2015.   Specialty:  Family Medicine   Why:  4:00 p.m. appointment for hospital follow-up   Contact information:   [place] [id number] [phone]       Follow up with Volanda Napoleon, MD.   Specialty:  Oncology   Why:  He will call you for follow-up appointment.    Contact information:   [address] [place] [address] [phone]       Rogue Bussing, MD 07/19/2015, 10:16 PM PGY-1, [place]
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
## 2015-07-09 DIAGNOSIS — I4892 Unspecified atrial flutter: Secondary | ICD-10-CM

## 2015-07-09 DIAGNOSIS — E11621 Type 2 diabetes mellitus with foot ulcer: Secondary | ICD-10-CM

## 2015-07-09 DIAGNOSIS — I10 Essential (primary) hypertension: Secondary | ICD-10-CM

## 2015-07-09 DIAGNOSIS — L97519 Non-pressure chronic ulcer of other part of right foot with unspecified severity: Secondary | ICD-10-CM

## 2015-07-09 DIAGNOSIS — E1122 Type 2 diabetes mellitus with diabetic chronic kidney disease: Secondary | ICD-10-CM

## 2015-07-09 DIAGNOSIS — D509 Iron deficiency anemia, unspecified: Secondary | ICD-10-CM

## 2015-07-09 DIAGNOSIS — C787 Secondary malignant neoplasm of liver and intrahepatic bile duct: Secondary | ICD-10-CM | POA: Insufficient documentation

## 2015-07-09 DIAGNOSIS — N319 Neuromuscular dysfunction of bladder, unspecified: Secondary | ICD-10-CM

## 2015-07-09 LAB — GLUCOSE, CAPILLARY
GLUCOSE-CAPILLARY: 145 mg/dL — AB (ref 65–99)
GLUCOSE-CAPILLARY: 137 mg/dL — AB (ref 65–99)
Glucose-Capillary: 161 mg/dL — ABNORMAL HIGH (ref 65–99)

## 2015-07-09 LAB — URINE CULTURE
Culture: >=100000
Special Requests: NORMAL

## 2015-07-09 MED ORDER — ENOXAPARIN SODIUM 40 MG/0.4ML ~~LOC~~ SOLN
40.0000 mg | Freq: Every day | SUBCUTANEOUS | Status: DC
  Filled 2015-07-09: qty 0.4

## 2015-07-09 MED ORDER — POTASSIUM CHLORIDE CRYS ER 20 MEQ PO TBCR
40.0000 meq | EXTENDED_RELEASE_TABLET | Freq: Once | ORAL | Status: AC
  Administered 2015-07-09: 40 meq via ORAL
  Filled 2015-07-09: qty 2
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
## 2015-07-09 NOTE — Progress Notes (Signed)
Patient continues to refuse Lovenox injection.  Patient educated on reason behind the injection.  Patient still refuses.  Will continue to monitor patient.  [employer] RN-BC, WTA.
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
## 2015-07-10 ENCOUNTER — Inpatient Hospital Stay (HOSPITAL_COMMUNITY): Payer: Medicaid Other

## 2015-07-10 DIAGNOSIS — Z8489 Family history of other specified conditions: Secondary | ICD-10-CM

## 2015-07-10 DIAGNOSIS — R17 Unspecified jaundice: Secondary | ICD-10-CM

## 2015-07-10 DIAGNOSIS — E119 Type 2 diabetes mellitus without complications: Secondary | ICD-10-CM

## 2015-07-10 DIAGNOSIS — C220 Liver cell carcinoma: Principal | ICD-10-CM

## 2015-07-10 DIAGNOSIS — Z87891 Personal history of nicotine dependence: Secondary | ICD-10-CM

## 2015-07-10 LAB — BASIC METABOLIC PANEL
Anion gap: 11 (ref 5–15)
BUN: 9 mg/dL (ref 6–20)
CHLORIDE: 105 mmol/L (ref 101–111)
CO2: 21 mmol/L — ABNORMAL LOW (ref 22–32)
CREATININE: 0.73 mg/dL (ref 0.61–1.24)
Calcium: 9.5 mg/dL (ref 8.9–10.3)
GFR calc non Af Amer: >60 mL/min (ref >60)
Glucose, Bld: 133 mg/dL — ABNORMAL HIGH (ref 65–99)
POTASSIUM: 4.3 mmol/L (ref 3.5–5.1)
SODIUM: 137 mmol/L (ref 135–145)

## 2015-07-10 LAB — GLUCOSE, CAPILLARY
GLUCOSE-CAPILLARY: 151 mg/dL — AB (ref 65–99)
GLUCOSE-CAPILLARY: 205 mg/dL — AB (ref 65–99)
GLUCOSE-CAPILLARY: 195 mg/dL — AB (ref 65–99)
Glucose-Capillary: 129 mg/dL — ABNORMAL HIGH (ref 65–99)
Glucose-Capillary: 168 mg/dL — ABNORMAL HIGH (ref 65–99)

## 2015-07-10 LAB — CBC
HEMATOCRIT: 27.9 % — AB (ref 39.0–52.0)
Hemoglobin: 9.6 g/dL — ABNORMAL LOW (ref 13.0–17.0)
MCH: 23.3 pg — ABNORMAL LOW (ref 26.0–34.0)
MCHC: 34.4 g/dL (ref 30.0–36.0)
MCV: 67.7 fL — AB (ref 78.0–100.0)
PLATELETS: 481 10*3/uL — AB (ref 150–400)
RBC: 4.12 MIL/uL — AB (ref 4.22–5.81)
RDW: 18.4 % — ABNORMAL HIGH (ref 11.5–15.5)
WBC: 8.8 10*3/uL (ref 4.0–10.5)

## 2015-07-10 MED ORDER — MORPHINE SULFATE 15 MG PO TABS: 15.0000 mg | ORAL_TABLET | Freq: Four times a day (QID) | ORAL | Status: DC | PRN

## 2015-07-10 MED ORDER — CHOLESTYRAMINE LIGHT 4 G PO PACK
4.0000 g | PACK | Freq: Three times a day (TID) | ORAL | Status: DC
  Administered 2015-07-11 – 2015-07-14 (×7): 4 g via ORAL
  Filled 2015-07-10 (×18): qty 1

## 2015-07-10 MED ORDER — FENTANYL CITRATE (PF) 100 MCG/2ML IJ SOLN
INTRAMUSCULAR | Status: AC
  Filled 2015-07-10: qty 2

## 2015-07-10 MED ORDER — FENTANYL CITRATE (PF) 100 MCG/2ML IJ SOLN
INTRAMUSCULAR | Status: AC | PRN
  Administered 2015-07-10: 50 ug via INTRAVENOUS

## 2015-07-10 MED ORDER — LIDOCAINE HCL (PF) 1 % IJ SOLN
INTRAMUSCULAR | Status: AC
  Filled 2015-07-10: qty 10

## 2015-07-10 MED ORDER — MIDAZOLAM HCL 2 MG/2ML IJ SOLN
INTRAMUSCULAR | Status: AC | PRN
  Administered 2015-07-10: 1 mg via INTRAVENOUS
  Administered 2015-07-10: 0.5 mg via INTRAVENOUS

## 2015-07-10 MED ORDER — MORPHINE SULFATE 15 MG PO TABS
15.0000 mg | ORAL_TABLET | Freq: Four times a day (QID) | ORAL | Status: DC | PRN
  Administered 2015-07-11 – 2015-07-12 (×3): 15 mg via ORAL
  Filled 2015-07-10 (×3): qty 1

## 2015-07-10 MED ORDER — MIDAZOLAM HCL 2 MG/2ML IJ SOLN
INTRAMUSCULAR | Status: AC
  Filled 2015-07-10: qty 2

## 2015-07-10 MED ORDER — SODIUM CHLORIDE 0.9 % IV SOLN
8.0000 mg | Freq: Three times a day (TID) | INTRAVENOUS | Status: DC | PRN
  Filled 2015-07-10: qty 4

## 2015-07-10 MED ORDER — GELATIN ABSORBABLE 12-7 MM EX MISC
CUTANEOUS | Status: AC
  Filled 2015-07-10: qty 1

## 2015-07-10 MED ORDER — SODIUM CHLORIDE 0.9 % IV SOLN
INTRAVENOUS | Status: AC | PRN
  Administered 2015-07-10: 10 mL/h via INTRAVENOUS

## 2015-07-10 MED ORDER — ONDANSETRON HCL 4 MG PO TABS
8.0000 mg | ORAL_TABLET | Freq: Three times a day (TID) | ORAL | Status: DC | PRN
  Administered 2015-07-10 – 2015-07-11 (×3): 8 mg via ORAL
  Filled 2015-07-10 (×4): qty 2

## 2015-07-10 MED ORDER — MORPHINE SULFATE ER 15 MG PO TBCR
15.0000 mg | EXTENDED_RELEASE_TABLET | Freq: Two times a day (BID) | ORAL | Status: DC
  Administered 2015-07-10 – 2015-07-14 (×8): 15 mg via ORAL
  Filled 2015-07-10 (×9): qty 1

## 2015-07-10 NOTE — Progress Notes (Signed)
   07/10/15 1400  Clinical Encounter Type  Visited With Patient and family together  Visit Type Spiritual support  Referral From Nurse  Spiritual Encounters  Spiritual Needs Prayer  Stress Factors  Patient Stress Factors Health changes;Lack of knowledge;Major life changes  Family Stress Factors Lack of knowledge  Visited with Ray Hall who is still waiting for biopsy results but hopes to go home today. Patient asked for prayer before I left, so I joined the nurse and [name] for prayer.

## 2015-07-10 NOTE — Procedures (Signed)
Interventional Radiology Procedure Note  Procedure: US guided bx of liver mass.  3 x 18G core.  Complications: None Recommendations:  - Follow up pathology - bedrest 3 hours - Routine wound care   Signed,  Dulcy Fanny. Earleen Newport, DO

## 2015-07-10 NOTE — Consult Note (Signed)
WOC wound consult note Reason for Consult: right toe wounds Patient with BKA on the left, history of right toe wounds from ill fitting shoe per patient since before Valentines day.  Followed by Dr. Sharol Given.  Palpable pulse on the right foot, no significant edema.  No s/s of infection Wound type: neuropathic with component of trauma Measurement: 2nd toe: 1cm x 1cm x 0.3cm  4th toe: 0.5cm x 0.5cm x 0.2cm  Wound bed: both are pink, very dry Drainage (amount, consistency, odor) none Periwound: intact Dressing procedure/placement/frequency: Clean wounds with saline, apply hydrogel, cover with dry dressing. Change daily. Reviewed s/s of infection with patient and caregiver in the room.  To follow up with Dr. Sharol Given if they notice any changes or worsening of the toe wounds. Verbalized understanding.  Discussed POC with patient and bedside nurse.  Re consult if needed, will not follow at this time. Thanks  Sayid Moll Kellogg, [place] [phone])
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
## 2015-07-11 ENCOUNTER — Inpatient Hospital Stay (HOSPITAL_COMMUNITY): Payer: Medicaid Other

## 2015-07-11 LAB — COMPREHENSIVE METABOLIC PANEL
ALBUMIN: 2.4 g/dL — AB (ref 3.5–5.0)
ALT: 60 U/L (ref 17–63)
AST: 145 U/L — ABNORMAL HIGH (ref 15–41)
Alkaline Phosphatase: 320 U/L — ABNORMAL HIGH (ref 38–126)
Anion gap: 13 (ref 5–15)
BUN: 11 mg/dL (ref 6–20)
CO2: 20 mmol/L — AB (ref 22–32)
Calcium: 9.4 mg/dL (ref 8.9–10.3)
Chloride: 103 mmol/L (ref 101–111)
Creatinine, Ser: 0.8 mg/dL (ref 0.61–1.24)
GFR calc non Af Amer: >60 mL/min (ref >60)
GLUCOSE: 161 mg/dL — AB (ref 65–99)
POTASSIUM: 4.3 mmol/L (ref 3.5–5.1)
SODIUM: 136 mmol/L (ref 135–145)
Total Bilirubin: 15.3 mg/dL — ABNORMAL HIGH (ref 0.3–1.2)
Total Protein: 6.8 g/dL (ref 6.5–8.1)

## 2015-07-11 LAB — GLUCOSE, CAPILLARY
GLUCOSE-CAPILLARY: 153 mg/dL — AB (ref 65–99)
GLUCOSE-CAPILLARY: 160 mg/dL — AB (ref 65–99)
GLUCOSE-CAPILLARY: 168 mg/dL — AB (ref 65–99)
Glucose-Capillary: 222 mg/dL — ABNORMAL HIGH (ref 65–99)

## 2015-07-11 LAB — CBC
HEMATOCRIT: 27.3 % — AB (ref 39.0–52.0)
HEMOGLOBIN: 9.3 g/dL — AB (ref 13.0–17.0)
MCH: 23.3 pg — AB (ref 26.0–34.0)
MCHC: 34.1 g/dL (ref 30.0–36.0)
MCV: 68.3 fL — ABNORMAL LOW (ref 78.0–100.0)
PLATELETS: 399 10*3/uL (ref 150–400)
RBC: 4 MIL/uL — AB (ref 4.22–5.81)
RDW: 18.7 % — AB (ref 11.5–15.5)
WBC: 8.9 10*3/uL (ref 4.0–10.5)

## 2015-07-11 MED ORDER — PIPERACILLIN-TAZOBACTAM 3.375 G IVPB
3.3750 g | INTRAVENOUS | Status: AC
  Administered 2015-07-12: 3.375 g via INTRAVENOUS
  Filled 2015-07-11: qty 50

## 2015-07-11 MED ORDER — ENOXAPARIN SODIUM 40 MG/0.4ML ~~LOC~~ SOLN
40.0000 mg | Freq: Every day | SUBCUTANEOUS | Status: DC
  Administered 2015-07-12 – 2015-07-16 (×5): 40 mg via SUBCUTANEOUS
  Filled 2015-07-11 (×5): qty 0.4

## 2015-07-11 MED ORDER — IOHEXOL 300 MG/ML  SOLN
100.0000 mL | Freq: Once | INTRAMUSCULAR | Status: AC | PRN
  Administered 2015-07-08: 100 mL via INTRAVENOUS

## 2015-07-11 MED ORDER — GADOBENATE DIMEGLUMINE 529 MG/ML IV SOLN
20.0000 mL | Freq: Once | INTRAVENOUS | Status: AC
  Administered 2015-07-11: 20 mL via INTRAVENOUS

## 2015-07-11 NOTE — Consult Note (Signed)
Chief Complaint: Patient was seen in consultation today for percutaneous transhepatic cholangiogram with biliary drain placement Chief Complaint  Patient presents with  . Hematuria  . Jaundice   at the request of Dr Marin Olp; Dr Ree Kida  Referring Physician(s): Dr Burney Gauze; Dr Dossie Arbour  Supervising Physician: Markus Daft  History of Present Illness: Ray Hall is a 55 y.o. male   Pt with hyperbilirubinemia Painless jaundice Liver masses/obstruction Tea colored urine Wt loss Liver mass bx 3/13: pending result Elevated bilirubin Elevated Alpha fetoprotein  Request made for percutaneous transhepatic cholangiogram with biliary drain placement Dr Anselm Pancoast has seen and examined pt; has reviewed all imaging He has approved procedure Scheduled for 3/15 in IR   Past Medical History  Diagnosis Date  . Sickle cell trait ([place])   . Normal nuclear stress test 07/2010    low prob of ischemia, EF 42%  . Echocardiogram abnormal     moderate LVH, mild LV hypokenesis, EF 42%  . History of Doppler ultrasound 07/2010    negative for DVT  . Abnormal CT of the abdomen     mild L hydronephrosis and hydroureter  . Status post radiofrequency ablation for arrhythmia 10/16/10    [facility] R[facility] MD)  . Diabetes mellitus   . Hypertension   . Osteomyelitis ([place]) 12/2014    LEFT FOOT  . Microcytic anemia 02/25/2015  . 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 Followed at [facility] urology.     Past Surgical History  Procedure Laterality Date  . Radiofrequency ablation  10/16/10    Cardiac for atrial arrythmia, [facility]  . Dental extractions  10/15/10    prior to ablation  . Patella fracture surgery      metal rod left leg  . Tonsillectomy    . Rotator cuff repair Left 10/2014  . Amputation Left 12/31/2014    Procedure: AMPUTATION BELOW KNEE;  Surgeon: Newt Minion, MD;  Location: [place];  Service: Orthopedics;  Laterality: Left;  .  Esophagogastroduodenoscopy (egd) with propofol N/A 03/02/2015    Procedure: ESOPHAGOGASTRODUODENOSCOPY (EGD) WITH PROPOFOL;  Surgeon: Wonda Horner, MD;  Location: [facility] ENDOSCOPY;  Service: Endoscopy;  Laterality: N/A;  . Colonoscopy N/A 03/03/2015    Procedure: COLONOSCOPY;  Surgeon: Wonda Horner, MD;  Location: [facility] ENDOSCOPY;  Service: Endoscopy;  Laterality: N/A;    Allergies: Infed and Lactose intolerance (gi)  Medications: Prior to Admission medications   Medication Sig Start Date End Date Taking? Authorizing Provider  aspirin EC 81 MG tablet Take 1 tablet (81 mg total) by mouth daily. 01/03/15  Yes Alyssa A Haney, MD  diphenhydramine-acetaminophen (TYLENOL PM) 25-500 MG TABS Take 2 tablets by mouth at bedtime.   Yes Historical Provider, MD  ENSURE (ENSURE) Take 237 mLs by mouth 2 (two) times daily between meals.   Yes Historical Provider, MD  Ferrous Sulfate (IRON) 325 (65 FE) MG TABS Take 325 mg by mouth once. 03/03/15  Yes Asiyah Cletis Media, MD  gabapentin (NEURONTIN) 100 MG capsule TAKE ONE CAPSULE BY MOUTH THREE TIMES DAILY 05/24/15  Yes Leone Brand, MD  glipiZIDE (GLUCOTROL) 10 MG tablet TAKE ONE TABLET BY MOUTH TWICE DAILY BEFORE MEAL(S) 09/07/14  Yes Leone Brand, MD  lisinopril (PRINIVIL,ZESTRIL) 40 MG tablet TAKE ONE TABLET BY MOUTH ONCE DAILY 03/31/15  Yes Leone Brand, MD  metFORMIN (GLUCOPHAGE) 1000 MG tablet Take 1 tablet (1,000 mg total) by mouth 2 (two) times daily with a meal.  09/07/14  Yes Leone Brand, MD  naproxen sodium (ANAPROX) 220 MG tablet Take 440 mg by mouth daily as needed (general pain).   Yes Historical Provider, MD     Family History  Problem Relation Age of Onset  . Sickle cell trait    . Heart failure Father   . Diabetes Father   . Diabetes Mother   . Hypertension Mother     Social History   Social History  . Marital Status: Married    Spouse Name: N/A  . Number of Children: N/A  . Years of Education: N/A   Social History Main Topics  .  Smoking status: Former Smoker -- 1.50 packs/day for 17 years    Types: Cigars    Quit date: 12/15/2014  . Smokeless tobacco: Never Used     Comment: 2 cigars  . Alcohol Use: No  . Drug Use: No  . Sexual Activity: Not Asked   Other Topics Concern  . None   Social History Narrative     Review of Systems: A 12 point ROS discussed and pertinent positives are indicated in the HPI above.  All other systems are negative.  Review of Systems  Constitutional: Positive for fatigue and unexpected weight change. Negative for fever, activity change and appetite change.  Respiratory: Negative for shortness of breath.   Gastrointestinal: Negative for abdominal pain.  Skin: Positive for color change.  Neurological: Positive for weakness.  Psychiatric/Behavioral: Negative for confusion.    Vital Signs: BP 162/97 mmHg  Pulse 70  Temp(Src) 97.7 F (36.5 C) (Oral)  Resp 19  Ht _0  (2.007 m)  Wt 276 lb (125.193 kg)  BMI 31.08 kg/m2  SpO2 100%  Physical Exam  Constitutional: He is oriented to person, place, and time.  Eyes: Scleral icterus is present.  Neck: Neck supple.  Cardiovascular: Normal rate, regular rhythm and normal heart sounds.   Pulmonary/Chest: Effort normal and breath sounds normal. He has no wheezes.  Abdominal: Soft. Bowel sounds are normal. There is tenderness.  Musculoskeletal: Normal range of motion.  Left BKA  Neurological: He is alert and oriented to person, place, and time.  Skin: Skin is warm and dry.  Psychiatric: He has a normal mood and affect. His behavior is normal. Judgment and thought content normal.  Nursing note and vitals reviewed.   Mallampati Score:  MD Evaluation Airway: WNL Heart: WNL Abdomen: WNL Chest/ Lungs: WNL ASA  Classification: 3 Mallampati/Airway Score: One  Imaging: Ct Abdomen Pelvis W Wo Contrast  07/08/2015  CLINICAL DATA:  Liver mass. EXAM: CT ABDOMEN AND PELVIS WITHOUT AND WITH CONTRAST TECHNIQUE: Multidetector CT imaging  of the abdomen and pelvis was performed following the standard protocol before and following the bolus administration of intravenous contrast. CONTRAST:  100 cc Omnipaque 300 intravenous COMPARISON:  Sonography from yesterday FINDINGS: Lower chest and abdominal wall:  No contributory findings. Hepatobiliary: Innumerable hypo enhancing masses throughout the liver consistent with metastatic disease. There is a confluent left liver mass which measures 9 cm. Intrahepatic bile duct dilatation. The left portal venous system is not clearly identified but no visible tumor thrombus. Deep liver drainage lymphadenopathy with portacaval node measuring 26 mm. Low-density right periaortic node at the level of the right kidney. The liver surface is nodular, but mainly in areas where there is distortion by mass. Overall no definitive cirrhosis - no fissure enlargement or caudate hypertrophy. Cholelithiasis. Pancreas: No primary is seen. Spleen: Unremarkable. Adrenals/Urinary Tract:  Negative adrenals. Chronic moderate left hydroureteronephrosis  to the level of the bladder. Bilateral ureteral insertion into the bladder appears ectopic. Left renal cortical thinning. No mass lesion is seen. Full urinary bladder Reproductive:No pathologic findings. Stomach/Bowel:  No primary lesion is seen. No inflammation. Vascular/Lymphatic: No acute vascular abnormality. No mass or adenopathy. Peritoneal: No ascites or pneumoperitoneum. Musculoskeletal: No aggressive lytic or blastic lesion. 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 IMPRESSION: 1. Numerous hepatic metastases with confluent 9 cm left hepatic mass. No extrahepatic intra-abdominal primary is identified. If chest imaging is negative question hepatocellular carcinoma or central cholangiocarcinoma. Intra-abdominal nodal metastases. 2. Dilated bladder correlating with history of neurogenic bladder. Chronic left hydroureteronephrosis with ectopic  appearance of the ureteral insertion. Electronically Signed   By: Monte Fantasia M.D.   On: 07/08/2015 01:10   Ct Chest W Contrast  07/09/2015  CLINICAL DATA:  Liver masses. EXAM: CT CHEST WITH CONTRAST TECHNIQUE: Multidetector CT imaging of the chest was performed during intravenous contrast administration. CONTRAST:  43m OMNIPAQUE IOHEXOL 300 MG/ML  SOLN COMPARISON:  None. FINDINGS: THORACIC INLET/BODY WALL: Symmetric mild gynecomastia.  No adenopathy. Symmetric full appearance of the thyroid without nodule. MEDIASTINUM: Normal heart size. No pericardial effusion. No acute vascular abnormality. No adenopathy. LUNG WINDOWS: No suspicious nodule or mass. There are 3 mm nodules in the right lung on 3:31 and 36. 338mnodule in the left lung 3:20. UPPER ABDOMEN: Known multiple liver masses and retroperitoneal adenopathy. OSSEOUS: No suspicious lytic or blastic lesions. Remote and healed left rib fractures. IMPRESSION: 1. No intrathoracic primary or suspected metastasis. 2. Few tiny pulmonary nodules which can be followed. Electronically Signed   By: JoMonte Fantasia.D.   On: 07/09/2015 00:22   Mr Liver W Wo Contrast  07/11/2015  CLINICAL DATA:  Painless jaundice, multiple liver masses EXAM: MRI ABDOMEN WITHOUT AND WITH CONTRAST TECHNIQUE: Multiplanar multisequence MR imaging of the abdomen was performed both before and after the administration of intravenous contrast. CONTRAST:  2071mULTIHANCE GADOBENATE DIMEGLUMINE 529 MG/ML IV SOLN COMPARISON:  CT abdomen pelvis dated 07/08/2015 FINDINGS: Lower chest:  Lung bases are clear. Hepatobiliary: Dominant 8.8 x 9.3 cm hypoenhancing mass centered in the medial segment left hepatic lobe (series 1302/ image 22). Innumerable additional smaller hypoenhancing metastases in both hepatic lobes. For example: --1.8 cm lesion in the anterior right hepatic dome (series 1302/ image 7) --2.6 cm lesion in the medial segment left hepatic lobe (series 1302/ image 21 --2.3 cm lesion  in the lateral segment left hepatic lobe (series 1302, image 2/image 31) --2.9 cm lesion in the lateral segment left hepatic lobe (series 1302, image 2/ image 34) --1.8 cm lesion inferiorly in the posterior segment right hepatic lobe (series 1302/ image 52) Gallbladder is notable for excretory contrast. No intrahepatic or extrahepatic ductal dilatation. Pancreas: Within normal limits. Spleen: Within normal limits. Adrenals/Urinary Tract: Adrenal glands are within normal limits. Right kidney is within normal limits. Moderate left hydroureteronephrosis, unchanged. Stomach/Bowel: Stomach is within normal limits. Visualized bowel is unremarkable. Vascular/Lymphatic: No evidence of abdominal aortic aneurysm. Portal vein is patent. Necrotic upper abdominal/retroperitoneal lymphadenopathy, including: --1.6 cm short axis peripancreatic node (series 1303/image 23) --1.9 cm short axis node in the porta hepatis (series 1303/ image 29) --1.7 cm short axis node in the porta hepatis (series 1303/ image 36) --2.3 cm short axis portacaval node (series 1303/ image 40) --1.7 cm short axis retrocaval node (series 1303/image 53) Other: No abdominal ascites. Musculoskeletal: No focal osseous lesions. IMPRESSION: Dominant 9.3 cm mass centered in the medial segment  left hepatic lobe. Innumerable additional smaller hypotensive metastases in both hepatic lobes. Necrotic upper abdominal/retroperitoneal lymphadenopathy, as above. Correlate with biopsy results. 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 Electronically Signed   By: Julian Hy M.D.   On: 07/11/2015 09:21   US Biopsy  07/10/2015  INDICATION: 55 year old male with a history of liver lesion, suspicious for carcinoma. EXAM: ULTRASOUND BIOPSY CORE LIVER COMPARISON:  CT 07/08/2015 MEDICATIONS: None. ANESTHESIA/SEDATION: Moderate (conscious) sedation was employed during this procedure. A total of Versed 1.5 mg  and Fentanyl 50 mcg was administered intravenously. Moderate Sedation Time: 10 minutes. The patient's level of consciousness and vital signs were monitored continuously by radiology nursing throughout the procedure under my direct supervision. FLUOROSCOPY TIME:  None COMPLICATIONS: None. PROCEDURE: Informed written consent was obtained from the patient after a thorough discussion of the procedural risks, benefits and alternatives. All questions were addressed. Maximal Sterile Barrier Technique was utilized including caps, mask, sterile gowns, sterile gloves, sterile drape, hand hygiene and skin antiseptic. A timeout was performed prior to the initiation of the procedure. Patient was positioned supine position on the gantry table and a scout ultrasound of the upper abdomen was performed for planning purposes. The patient was then prepped and draped in the usual sterile fashion. Using ultrasound guidance, the skin and subcutaneous tissues were generously infiltrated 1% lidocaine for local anesthesia. 1% lidocaine was used to anesthetize the liver capsule. A small stab incision was made with 11 blade scalpel common using ultrasound guidance, a 17 gauge guide needle was advanced into the lesion targeted for biopsy. Stylet was removed and 3 separate 18 gauge core biopsy were achieved. Two Gel-Foam pledgets were then infused with a small amount of saline. Needle was removed. Final image was stored. The patient tolerated the procedure well and remained hemodynamically stable throughout. No complications were encountered and no significant blood loss. IMPRESSION: Status post ultrasound-guided biopsy of liver lesion. Tissue specimen sent to pathology for complete histopathologic analysis. Signed, Dulcy Fanny. Earleen Newport, DO [facility] Rad[facility] Electronically Signed   By: Corrie Mckusick D.O.   On: 07/10/2015 10:20   US Abdomen Limited Ruq  07/07/2015  CLINICAL DATA:  Elevated  bilirubin, jaundice EXAM: US ABDOMEN LIMITED - RIGHT UPPER QUADRANT COMPARISON:  None. FINDINGS: Gallbladder: Contracted gallbladder. Secondary gallbladder wall thickening. Possible 10 mm layering gallstone. Negative sonographic Murphy's sign. Common bile duct: Dilated common duct, measuring up to 12 mm. Liver: Heterogeneous hepatic parenchyma. 3.5 x 2.5 x 2.9 cm hypoechoic mass in the left hepatic lobe. Possible central intrahepatic ductal dilatation. IMPRESSION: 3.5 cm hypoechoic mass in the left hepatic lobe, suspicious for metastasis. Dilated common duct, measuring 12 mm. Possible central intrahepatic ductal dilatation. Given the presence of a hepatic mass, this appearance is worrisome for an obstructing lesion. Contracted gallbladder with cholelithiasis. No associated sonographic findings to suggest acute cholecystitis. CT abdomen pelvis with/without contrast (liver protocol) is suggested for initial evaluation. Electronically Signed   By: Julian Hy M.D.   On: 07/07/2015 20:26    Labs:  CBC:  Recent Labs  07/07/15 1525 07/08/15 0708 07/10/15 0708 07/11/15 0610  WBC 9.4 9.5 8.8 8.9  HGB 10.1* 9.3* 9.6* 9.3*  HCT 28.9* 26.0* 27.9* 27.3*  PLT 513* 505* 481* 399    COAGS:  Recent Labs  12/31/14 0131 07/07/15 1848  INR 1.18 1.11    BMP:  Recent Labs  07/07/15 1525 07/08/15 0708 07/10/15 0708 07/11/15 0610  NA 136 138 137  136  K 4.1 3.4* 4.3 4.3  CL 104 108 105 103  CO2 19* 19* 21* 20*  GLUCOSE 72 97 133* 161*  BUN _0 CALCIUM 9.8 9.4 9.5 9.4  CREATININE 0.81 0.74 0.73 0.80  GFRNONAA >60 >60 >60 >60  GFRAA >60 >60 >60 >60    LIVER FUNCTION TESTS:  Recent Labs  03/01/15 1100 07/07/15 1525 07/08/15 0708 07/11/15 0610  BILITOT 0.3 21.1* 18.9* 15.3*  AST 29 133* 112* 145*  ALT 17 66* 59 60  ALKPHOS 91 385* 323* 320*  PROT 6.6 8.0 6.8 6.8  ALBUMIN 2.6* 2.9* 2.4* 2.4*    TUMOR MARKERS:  Recent Labs  07/08/15 0708  AFPTM 1472.0*     Assessment and Plan:  Painless jaundice Known liver lesions/obstruction Elevated AFP and bilirubin Scheduled for percutaneous transhepatic cholangiogram with biliary drain placement 3/15 in IR Risks and Benefits discussed with the patient including, but not limited to bleeding, infection which may lead to sepsis or even death and damage to adjacent structures. All of the patient's questions were answered, patient is agreeable to proceed. Consent signed and in chart.   Thank you for this interesting consult.  I greatly enjoyed meeting Ray Hall and look forward to participating in their care.  A copy of this report was sent to the requesting provider on this date.  Electronically Signed: Monia Sabal A 07/11/2015, 2:11 PM    I spent a total of 40 Minutes    in face to face in clinical consultation, greater than 50% of which was counseling/coordinating care for PTC/Biliary drain
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
## 2015-07-11 NOTE — Progress Notes (Signed)
   07/11/15 1300  Clinical Encounter Type  Visited With Patient  Visit Type Follow-up  Stress Factors  Patient Stress Factors Lack of knowledge;Health changes  Checked in with patient when I learned he was still in hospital, knowing he had hoped to be discharged yesterday. Found him in good spirits and still feeling that he was in good hands.

## 2015-07-12 ENCOUNTER — Inpatient Hospital Stay (HOSPITAL_COMMUNITY): Payer: Medicaid Other

## 2015-07-12 LAB — COMPREHENSIVE METABOLIC PANEL
ALK PHOS: 364 U/L — AB (ref 38–126)
ALT: 75 U/L — AB (ref 17–63)
AST: 173 U/L — AB (ref 15–41)
Albumin: 2.7 g/dL — ABNORMAL LOW (ref 3.5–5.0)
Anion gap: 14 (ref 5–15)
BUN: 11 mg/dL (ref 6–20)
CALCIUM: 9.6 mg/dL (ref 8.9–10.3)
CHLORIDE: 105 mmol/L (ref 101–111)
CO2: 15 mmol/L — AB (ref 22–32)
CREATININE: 0.79 mg/dL (ref 0.61–1.24)
Glucose, Bld: 165 mg/dL — ABNORMAL HIGH (ref 65–99)
Potassium: 5.2 mmol/L — ABNORMAL HIGH (ref 3.5–5.1)
SODIUM: 134 mmol/L — AB (ref 135–145)
Total Bilirubin: 17.3 mg/dL — ABNORMAL HIGH (ref 0.3–1.2)
Total Protein: 7.7 g/dL (ref 6.5–8.1)

## 2015-07-12 LAB — CBC
HEMATOCRIT: 27 % — AB (ref 39.0–52.0)
Hemoglobin: 9.6 g/dL — ABNORMAL LOW (ref 13.0–17.0)
MCH: 24.4 pg — ABNORMAL LOW (ref 26.0–34.0)
MCHC: 35.6 g/dL (ref 30.0–36.0)
MCV: 68.5 fL — AB (ref 78.0–100.0)
PLATELETS: 377 10*3/uL (ref 150–400)
RBC: 3.94 MIL/uL — ABNORMAL LOW (ref 4.22–5.81)
RDW: 19 % — AB (ref 11.5–15.5)
WBC: 9.2 10*3/uL (ref 4.0–10.5)

## 2015-07-12 LAB — GLUCOSE, CAPILLARY
Glucose-Capillary: 164 mg/dL — ABNORMAL HIGH (ref 65–99)
Glucose-Capillary: 138 mg/dL — ABNORMAL HIGH (ref 65–99)
Glucose-Capillary: 148 mg/dL — ABNORMAL HIGH (ref 65–99)

## 2015-07-12 LAB — PROTIME-INR
INR: 1.28 (ref 0.00–1.49)
Prothrombin Time: 16.1 seconds — ABNORMAL HIGH (ref 11.6–15.2)

## 2015-07-12 MED ORDER — LIDOCAINE HCL 1 % IJ SOLN
INTRAMUSCULAR | Status: AC
Start: 2015-07-12 — End: 2015-07-13
  Filled 2015-07-12: qty 20

## 2015-07-12 MED ORDER — LIDOCAINE HCL 1 % IJ SOLN
INTRAMUSCULAR | Status: AC
  Filled 2015-07-12: qty 20

## 2015-07-12 MED ORDER — FENTANYL CITRATE (PF) 100 MCG/2ML IJ SOLN
INTRAMUSCULAR | Status: AC
  Filled 2015-07-12: qty 2

## 2015-07-12 MED ORDER — MORPHINE SULFATE 15 MG PO TABS
15.0000 mg | ORAL_TABLET | Freq: Four times a day (QID) | ORAL | Status: DC | PRN
  Administered 2015-07-12 – 2015-07-13 (×2): 15 mg via ORAL
  Filled 2015-07-12 (×2): qty 1

## 2015-07-12 MED ORDER — MIDAZOLAM HCL 2 MG/2ML IJ SOLN
INTRAMUSCULAR | Status: AC
Start: 2015-07-12 — End: 2015-07-13
  Filled 2015-07-12: qty 2

## 2015-07-12 MED ORDER — IOHEXOL 300 MG/ML  SOLN
100.0000 mL | Freq: Once | INTRAMUSCULAR | Status: AC | PRN
  Administered 2015-07-12: 25 mL

## 2015-07-12 MED ORDER — ONDANSETRON HCL 4 MG/2ML IJ SOLN
INTRAMUSCULAR | Status: AC
  Filled 2015-07-12: qty 2

## 2015-07-12 MED ORDER — ONDANSETRON HCL 40 MG/20ML IJ SOLN
8.0000 mg | Freq: Four times a day (QID) | INTRAMUSCULAR | Status: DC | PRN
  Filled 2015-07-12: qty 4

## 2015-07-12 MED ORDER — FENTANYL CITRATE (PF) 100 MCG/2ML IJ SOLN
INTRAMUSCULAR | Status: AC | PRN
  Administered 2015-07-12: 25 ug via INTRAVENOUS
  Administered 2015-07-12: 50 ug via INTRAVENOUS
  Administered 2015-07-12 (×5): 25 ug via INTRAVENOUS

## 2015-07-12 MED ORDER — SODIUM CHLORIDE 0.9 % IV SOLN
INTRAVENOUS | Status: AC | PRN
  Administered 2015-07-12: 10 mL/h via INTRAVENOUS

## 2015-07-12 MED ORDER — ONDANSETRON HCL 4 MG PO TABS
8.0000 mg | ORAL_TABLET | Freq: Four times a day (QID) | ORAL | Status: DC | PRN
  Administered 2015-07-12 – 2015-07-14 (×4): 8 mg via ORAL
  Filled 2015-07-12 (×4): qty 2

## 2015-07-12 MED ORDER — MIDAZOLAM HCL 2 MG/2ML IJ SOLN
INTRAMUSCULAR | Status: AC
  Filled 2015-07-12: qty 2

## 2015-07-12 MED ORDER — MIDAZOLAM HCL 2 MG/2ML IJ SOLN
INTRAMUSCULAR | Status: AC
  Filled 2015-07-12: qty 4

## 2015-07-12 MED ORDER — ONDANSETRON HCL 4 MG/2ML IJ SOLN
INTRAMUSCULAR | Status: AC | PRN
  Administered 2015-07-12: 4 mg via INTRAVENOUS

## 2015-07-12 MED ORDER — MIDAZOLAM HCL 2 MG/2ML IJ SOLN
INTRAMUSCULAR | Status: AC | PRN
  Administered 2015-07-12: 0.5 mg via INTRAVENOUS
  Administered 2015-07-12: 1 mg via INTRAVENOUS
  Administered 2015-07-12 (×2): 0.5 mg via INTRAVENOUS
  Administered 2015-07-12: 1 mg via INTRAVENOUS
  Administered 2015-07-12 (×2): 0.5 mg via INTRAVENOUS
  Administered 2015-07-12: 2 mg via INTRAVENOUS
  Administered 2015-07-12: 0.5 mg via INTRAVENOUS

## 2015-07-12 NOTE — Sedation Documentation (Signed)
Moaning in pain meds given as ordered
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
## 2015-07-12 NOTE — Procedures (Signed)
Interventional Radiology Procedure Note  Procedure:  Internal/external biliary drain with cholangiogram  Complications:  None  Estimated Blood Loss: 10 mL  Right lobe duct accessed.  Contrast injection shows high grade central obstruction of right and left lobe ducts.  After guidewire negotiated into duodenum, 10 Fr internal/external biliary drain placed and advanced into duodenum.  Placed to gravity bag drainage.  Will follow output and bilirubin.  Venetia Night. Kathlene Cote, M.D Pager:  [phone]

## 2015-07-13 ENCOUNTER — Inpatient Hospital Stay (HOSPITAL_COMMUNITY): Payer: Medicaid Other

## 2015-07-13 DIAGNOSIS — R1011 Right upper quadrant pain: Secondary | ICD-10-CM

## 2015-07-13 LAB — COMPREHENSIVE METABOLIC PANEL
ALT: 69 U/L — ABNORMAL HIGH (ref 17–63)
AST: 151 U/L — ABNORMAL HIGH (ref 15–41)
Albumin: 2.4 g/dL — ABNORMAL LOW (ref 3.5–5.0)
Alkaline Phosphatase: 312 U/L — ABNORMAL HIGH (ref 38–126)
Anion gap: 13 (ref 5–15)
BUN: 7 mg/dL (ref 6–20)
CO2: 21 mmol/L — ABNORMAL LOW (ref 22–32)
Calcium: 9.5 mg/dL (ref 8.9–10.3)
Chloride: 102 mmol/L (ref 101–111)
Creatinine, Ser: 0.83 mg/dL (ref 0.61–1.24)
GFR calc Af Amer: >60 mL/min (ref >60)
GFR calc non Af Amer: >60 mL/min (ref >60)
Glucose, Bld: 172 mg/dL — ABNORMAL HIGH (ref 65–99)
Potassium: 4.5 mmol/L (ref 3.5–5.1)
Sodium: 136 mmol/L (ref 135–145)
Total Bilirubin: 16 mg/dL — ABNORMAL HIGH (ref 0.3–1.2)
Total Protein: 7.2 g/dL (ref 6.5–8.1)

## 2015-07-13 LAB — CBC
HEMATOCRIT: 28.7 % — AB (ref 39.0–52.0)
HEMOGLOBIN: 10.1 g/dL — AB (ref 13.0–17.0)
MCH: 24 pg — ABNORMAL LOW (ref 26.0–34.0)
MCHC: 35.2 g/dL (ref 30.0–36.0)
MCV: 68.3 fL — ABNORMAL LOW (ref 78.0–100.0)
Platelets: 371 10*3/uL (ref 150–400)
RBC: 4.2 MIL/uL — ABNORMAL LOW (ref 4.22–5.81)
RDW: 19.2 % — AB (ref 11.5–15.5)
WBC: 9.8 10*3/uL (ref 4.0–10.5)

## 2015-07-13 LAB — GLUCOSE, CAPILLARY
GLUCOSE-CAPILLARY: 178 mg/dL — AB (ref 65–99)
Glucose-Capillary: 169 mg/dL — ABNORMAL HIGH (ref 65–99)
Glucose-Capillary: 171 mg/dL — ABNORMAL HIGH (ref 65–99)
Glucose-Capillary: 194 mg/dL — ABNORMAL HIGH (ref 65–99)

## 2015-07-13 MED ORDER — MORPHINE SULFATE (PF) 2 MG/ML IV SOLN
1.0000 mg | INTRAVENOUS | Status: DC | PRN
  Administered 2015-07-13: 1 mg via INTRAVENOUS
  Filled 2015-07-13: qty 1

## 2015-07-13 MED ORDER — MORPHINE SULFATE 15 MG PO TABS
30.0000 mg | ORAL_TABLET | Freq: Four times a day (QID) | ORAL | Status: DC | PRN
Start: 2015-07-13 — End: 2015-07-14
  Administered 2015-07-13 – 2015-07-14 (×3): 30 mg via ORAL
  Filled 2015-07-13 (×4): qty 2

## 2015-07-13 NOTE — Progress Notes (Signed)
Pt continues with frank red blood to drain with large clots, making it difficult to drain bag. Pt also with complaints of pain 10/10 to insertion site. Unable to palpate surrounding area for increase in temperature due to tenderness. Pt also with complaints of dizziness. Vital signs: B/P = 118/63, 98.1, HR = 72, RR = 16, 99% on RA.  Page to on call radiologist, Schick  at [phone].  Awaiting call back.

## 2015-07-13 NOTE — Progress Notes (Signed)
[id number] pt biliary drain empty , drain color amber yellow with little blood clot , patient in no discomfort .325ml of drainage emptied.Will continue to monitor

## 2015-07-13 NOTE — Progress Notes (Signed)
07/13/2015 10:24 AM  Flushed patient's biliary drain with 10cc normal saline.  Pt tolerated procedure well.  No drainage was noted in the drainage bag prior to flush, and a very small amount came back out into the back after the flush.  Drainage appeared blood tinged, no clots noted.  Princella Pellegrini

## 2015-07-13 NOTE — Progress Notes (Signed)
Referring Physician(s): Dr Dossie Arbour  Supervising Physician: Jacqulynn Cadet MD  Chief Complaint:  Internal/ext Biliary drain placed 07/12/15   Subjective:  Pt painful at site Afeb TB down Alk Phos down Wbc wnl  Output dark brown; no bleeding CT neg for hematoma post procedure   Allergies: Infed and Lactose intolerance (gi)  Medications: Prior to Admission medications   Medication Sig Start Date End Date Taking? Authorizing Provider  aspirin EC 81 MG tablet Take 1 tablet (81 mg total) by mouth daily. 01/03/15  Yes Alyssa A Haney, MD  diphenhydramine-acetaminophen (TYLENOL PM) 25-500 MG TABS Take 2 tablets by mouth at bedtime.   Yes Historical Provider, MD  ENSURE (ENSURE) Take 237 mLs by mouth 2 (two) times daily between meals.   Yes Historical Provider, MD  Ferrous Sulfate (IRON) 325 (65 FE) MG TABS Take 325 mg by mouth once. 03/03/15  Yes Asiyah Cletis Media, MD  gabapentin (NEURONTIN) 100 MG capsule TAKE ONE CAPSULE BY MOUTH THREE TIMES DAILY 05/24/15  Yes Leone Brand, MD  glipiZIDE (GLUCOTROL) 10 MG tablet TAKE ONE TABLET BY MOUTH TWICE DAILY BEFORE MEAL(S) 09/07/14  Yes Leone Brand, MD  lisinopril (PRINIVIL,ZESTRIL) 40 MG tablet TAKE ONE TABLET BY MOUTH ONCE DAILY 03/31/15  Yes Leone Brand, MD  metFORMIN (GLUCOPHAGE) 1000 MG tablet Take 1 tablet (1,000 mg total) by mouth 2 (two) times daily with a meal. 09/07/14  Yes Leone Brand, MD  naproxen sodium (ANAPROX) 220 MG tablet Take 440 mg by mouth daily as needed (general pain).   Yes Historical Provider, MD     Vital Signs: BP 132/85 mmHg  Pulse 66  Temp(Src) 98.1 F (36.7 C) (Oral)  Resp 14  Ht 6' 7"  (2.007 m)  Wt 276 lb (125.193 kg)  BMI 31.08 kg/m2  SpO2 100%  Physical Exam  Abdominal: Soft. Bowel sounds are normal. There is tenderness.  Skin: Skin is warm and dry.  Site is clean and dry Tender No bleeding No hematoma   Nursing note and vitals reviewed.   Imaging: Ct Abdomen Wo  Contrast  07/13/2015  CLINICAL DATA:  Right upper quadrant pain. Biliary drain placed yesterday 05/01/2013. History of diabetes, hypertension, sickle cell trait, neurogenic bladder. EXAM: CT ABDOMEN WITHOUT CONTRAST TECHNIQUE: Multidetector CT imaging of the abdomen was performed following the standard protocol without IV contrast. COMPARISON:  07/08/2015 FINDINGS: Lower chest: There is bibasilar atelectasis. Small right middle lobe pulmonary nodule is 4 mm on image 5 of series 3. A second right middle lobe pulmonary nodule is only partially imaged on image 1 of series 3 and measures at least 4 mm. Upper abdomen: Numerous low-attenuation lesions are again identified throughout the liver, better identified on previous contrast-enhanced exam. Largest, confluent mass in the left hepatic lobe is at least 10 cm in diameter. The liver is enlarged by the lesions, measuring 23 cm in craniocaudal length. Percutaneous right-sided biliary drain is in place. There is residual contrast within right lobe biliary ducts. Layering high attenuation material within the gallbladder, consistent with stones or sludge. Presence of the material in the gallbladder demonstrates a thickened gallbladder wall. No focal abnormality identified within the spleen or pancreas. There is bilateral hydronephrosis, left greater than right. There is residual contrast in the collecting system possibly related to urinary stasis. Adrenal glands are normal in appearance. Gastrointestinal tract: The stomach and small bowel loops are normal in appearance. The appendix is well seen and has a normal appearance. Visualized colonic loops are  normal in appearance. Pelvis: Only partially imaged. There is a small amount of ascites in the right pericolic gutter. The partially imaged urinary bladder contains contrast from recent CT exam. Retroperitoneum: Enlarged nodes are identified in the region of porta hepatis. No evidence for aortic aneurysm. Abdominal wall:  Gynecomastia is present. 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 No suspicious lytic or blastic lesions are identified. IMPRESSION: 1. Interval placement of biliary drain. 2. Thickened gallbladder wall. 3. Bilateral hydronephrosis, left greater than right. 4. Residual urinary tract contrast, likely related to urinary stasis. 5. Normal appendix. 6. Numerous hepatic lesions. Electronically Signed   By: Nolon Nations M.D.   On: 07/13/2015 09:59   Mr Liver W Wo Contrast  07/11/2015  CLINICAL DATA:  Painless jaundice, multiple liver masses EXAM: MRI ABDOMEN WITHOUT AND WITH CONTRAST TECHNIQUE: Multiplanar multisequence MR imaging of the abdomen was performed both before and after the administration of intravenous contrast. CONTRAST:  26m MULTIHANCE GADOBENATE DIMEGLUMINE 529 MG/ML IV SOLN COMPARISON:  CT abdomen pelvis dated 07/08/2015 FINDINGS: Lower chest:  Lung bases are clear. Hepatobiliary: Dominant 8.8 x 9.3 cm hypoenhancing mass centered in the medial segment left hepatic lobe (series 1302/ image 22). Innumerable additional smaller hypoenhancing metastases in both hepatic lobes. For example: --1.8 cm lesion in the anterior right hepatic dome (series 1302/ image 7) --2.6 cm lesion in the medial segment left hepatic lobe (series 1302/ image 21 --2.3 cm lesion in the lateral segment left hepatic lobe (series 1302, image 2/image 31) --2.9 cm lesion in the lateral segment left hepatic lobe (series 1302, image 2/ image 34) --1.8 cm lesion inferiorly in the posterior segment right hepatic lobe (series 1302/ image 52) Gallbladder is notable for excretory contrast. No intrahepatic or extrahepatic ductal dilatation. Pancreas: Within normal limits. Spleen: Within normal limits. Adrenals/Urinary Tract: Adrenal glands are within normal limits. Right kidney is within normal limits. Moderate left hydroureteronephrosis, unchanged. Stomach/Bowel: Stomach is  within normal limits. Visualized bowel is unremarkable. Vascular/Lymphatic: No evidence of abdominal aortic aneurysm. Portal vein is patent. Necrotic upper abdominal/retroperitoneal lymphadenopathy, including: --1.6 cm short axis peripancreatic node (series 1303/image 23) --1.9 cm short axis node in the porta hepatis (series 1303/ image 29) --1.7 cm short axis node in the porta hepatis (series 1303/ image 36) --2.3 cm short axis portacaval node (series 1303/ image 40) --1.7 cm short axis retrocaval node (series 1303/image 53) Other: No abdominal ascites. Musculoskeletal: No focal osseous lesions. IMPRESSION: Dominant 9.3 cm mass centered in the medial segment left hepatic lobe. Innumerable additional smaller hypotensive metastases in both hepatic lobes. Necrotic upper abdominal/retroperitoneal lymphadenopathy, as above. Correlate with biopsy results. 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 Electronically Signed   By: SJulian HyM.D.   On: 07/11/2015 09:21   [place]  07/10/2015  INDICATION: 55year old male with a history of liver lesion, suspicious for carcinoma. EXAM: ULTRASOUND BIOPSY CORE LIVER COMPARISON:  CT 07/08/2015 MEDICATIONS: None. ANESTHESIA/SEDATION: Moderate (conscious) sedation was employed during this procedure. A total of Versed 1.5 mg and Fentanyl 50 mcg was administered intravenously. Moderate Sedation Time: 10 minutes. The patient's level of consciousness and vital signs were monitored continuously by radiology nursing throughout the procedure under my direct supervision. FLUOROSCOPY TIME:  None COMPLICATIONS: None. PROCEDURE: Informed written consent was obtained from the patient after a thorough discussion of the procedural risks, benefits and alternatives. All questions were addressed. Maximal Sterile Barrier Technique was utilized including caps, mask, sterile gowns, sterile gloves,  sterile drape, hand hygiene  and skin antiseptic. A timeout was performed prior to the initiation of the procedure. Patient was positioned supine position on the gantry table and a scout ultrasound of the upper abdomen was performed for planning purposes. The patient was then prepped and draped in the usual sterile fashion. Using ultrasound guidance, the skin and subcutaneous tissues were generously infiltrated 1% lidocaine for local anesthesia. 1% lidocaine was used to anesthetize the liver capsule. A small stab incision was made with 11 blade scalpel common using ultrasound guidance, a 17 gauge guide needle was advanced into the lesion targeted for biopsy. Stylet was removed and 3 separate 18 gauge core biopsy were achieved. Two Gel-Foam pledgets were then infused with a small amount of saline. Needle was removed. Final image was stored. The patient tolerated the procedure well and remained hemodynamically stable throughout. No complications were encountered and no significant blood loss. IMPRESSION: Status post ultrasound-guided biopsy of liver lesion. Tissue specimen sent to pathology for complete histopathologic analysis. Signed, Dulcy Fanny. Earleen Newport, DO [facility] Radiology Electronically Signed   By: Corrie Mckusick D.O.   On: 07/10/2015 10:20   Ir Int Lianne Cure Biliary Drain With Cholangiogram  07/12/2015  INDICATION: Large an multifocal hepatic mass is centered in the left lobe and central liver causing biliary obstruction. This presumably represents cholangiocarcinoma. Final results of a percutaneous biopsy performed on 07/10/2015 are pending. The patient requires percutaneous biliary drainage. EXAM: PERCUTANEOUS INTERNAL/EXTERNAL BILIARY DRAINAGE CATHETER PLACEMENT WITH CHOLANGIOGRAM COMPARISON:  MRI of the abdomen on 07/11/2015 and CT of the abdomen on 07/08/2015. MEDICATIONS: 3.375 g IV Zosyn CONTRAST:  72m OMNIPAQUE IOHEXOL 300 MG/ML  SOLN ANESTHESIA/SEDATION: 8.0 mg IV Versed, 200 mcg IV  fentanyl Total Moderate Sedation Time 75 minutes. The patient's level of consciousness and physiologic status were continuously monitored during the procedure by Radiology nursing. FLUOROSCOPY TIME:  11 minutes. COMPLICATIONS: None TECHNIQUE: Informed written consent was obtained from [place] of the risks, benefits and alternatives to treatment. Questions regarding the procedure were encouraged and answered. A timeout was performed prior to the initiation of the procedure. The right upper abdominal quadrant was prepped and draped in the usual sterile fashion, and a sterile drape was applied covering the operative field. Maximum barrier sterile technique with sterile gowns and gloves were used for the procedure. A timeout was performed prior to the initiation of the procedure. Ultrasound of the liver was performed to delineate bile ducts in the right lobe of the liver. After the overlying soft tissues were anesthetized with 1% Lidocaine, a 21 gauge Chiba needle was utilized to opacify the peripheral aspect of an anterior right hepatic duct. Cholangiogram was performed. Guidewire was advanced. Additional percutaneous puncture was then performed under fluoroscopic guidance of a right intrahepatic bile duct with a 21 gauge needle. A guidewire was advanced through the needle. A transitional micropuncture dilator was then advanced. Over a guidewire, a 5 French catheter was advanced into the central bile ducts. This catheter was further advanced into the common bile duct and duodenum over a hydrophilic guidewire. Over an Amplatz stiff wire, the tract was dilated and a 10.2 [place] drainage catheter was advanced with coil ultimately locked within the duodenum. Contrast was injected and a completion radiograph was obtained. The catheter was connected to a drainage bag which yielded the brisk return of clear bile. The catheter was secured to the skin with a Prolene retention suture and  StatLock device. FINDINGS: After  right lobe bile duct puncture and contrast injection, cholangiography shows a high-grade central biliary obstruction at the confluence of right lobe and left lobe ducts extending into central ducts. The left lobe ductal system was never opacified and may be completely obstructed by tumor. The level of obstruction was able to be crossed with a guidewire and catheter allowing placement of the internal/external biliary drain down to the level of the duodenum. The distal portion of the catheter was formed in the duodenum with sideholes extending up the common bile duct and into the central right lobe of the liver. IMPRESSION: After right lobe ductal access, cholangiography demonstrates a high-grade central biliary obstruction extending into intrahepatic right lobe ducts. The left lobe ductal system was not opacified and presumably completely obstructed by tumor. A 10 French internal/external biliary drainage catheter was able to be advanced through the level of obstruction and to the duodenum. This tube will be left to gravity bag drainage. Electronically Signed   By: Aletta Edouard M.D.   On: 07/12/2015 17:26    Labs:  CBC:  Recent Labs  07/10/15 0708 07/11/15 0610 07/12/15 1016 07/13/15 0449  WBC 8.8 8.9 9.2 9.8  HGB 9.6* 9.3* 9.6* 10.1*  HCT 27.9* 27.3* 27.0* 28.7*  PLT 481* 399 377 371    COAGS:  Recent Labs  12/31/14 0131 07/07/15 1848 07/12/15 0529  INR 1.18 1.11 1.28    BMP:  Recent Labs  07/10/15 0708 07/11/15 0610 07/12/15 0838 07/13/15 0450  NA 137 136 134* 136  K 4.3 4.3 5.2* 4.5  CL 105 103 105 102  CO2 21* 20* 15* 21*  GLUCOSE 133* 161* 165* 172*  BUN 9 11 11 7   CALCIUM 9.5 9.4 9.6 9.5  CREATININE 0.73 0.80 0.79 0.83  GFRNONAA >60 >60 >60 >60  GFRAA >60 >60 >60 >60    LIVER FUNCTION TESTS:  Recent Labs  07/08/15 0708 07/11/15 0610 07/12/15 0838 07/13/15 0450  BILITOT 18.9* 15.3* 17.3* 16.0*  AST 112* 145* 173* 151*   ALT 59 60 75* 69*  ALKPHOS 323* 320* 364* 312*  PROT 6.8 6.8 7.7 7.2  ALBUMIN 2.4* 2.4* 2.7* 2.4*    Assessment and Plan:  Liver lesions Obstructive jaundice Int/Ext biliary drain placed 3/15 in IR Will follow Plan per Dr Marin Olp and Dr Ree Kida   Electronically Signed: Monia Sabal A 07/13/2015, 1:54 PM   I spent a total of 15 Minutes at the the patient's bedside AND on the patient's hospital floor or unit, greater than 50% of which was counseling/coordinating care for Biliary drain placement
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
## 2015-07-13 NOTE — Progress Notes (Signed)
Saw patient at bedside. He reports increased pain at site of biliary drain. Pain was at a 10/10 prior to getting prn PO morphine, and at an 8/10 about an hour afterwards. Pain got worse after he got out of bed. Reviewed vital signs, which were stable, and attempted to look at drain site, but this was too painful for patient. About 20 cc of thick red drainage was present in gravity bag. He was tender to light palpation around the site, especially RUQ. He could tolerate palpation over left side of abdomen with minimal guarding. Ordered IV morphine 1 mg q1h prn. Ordered CBC to check that hemoglobin is stable. Spoke with Interventional Radiologist on call Dr. Annamaria Boots who recommended CT abdomen without contrast to check for hematoma as cause of pain. Patient notified of plan. He expressed concern about drain being placed "through tumor" rather than "around tumor." IR to see patient this morning.

## 2015-07-13 NOTE — Progress Notes (Addendum)
Page to on call Family Medicine at [phone] with call back from Dr. Ola Spurr for notification of pt's continued bleeding to drain, V/S and complaints of pain. MD to come and round on patient for further assessment. Will continue to monitor closely. Dorthey Sawyer, RN

## 2015-07-14 DIAGNOSIS — Z515 Encounter for palliative care: Secondary | ICD-10-CM | POA: Insufficient documentation

## 2015-07-14 DIAGNOSIS — C221 Intrahepatic bile duct carcinoma: Secondary | ICD-10-CM | POA: Diagnosis not present

## 2015-07-14 DIAGNOSIS — C787 Secondary malignant neoplasm of liver and intrahepatic bile duct: Secondary | ICD-10-CM

## 2015-07-14 DIAGNOSIS — C801 Malignant (primary) neoplasm, unspecified: Secondary | ICD-10-CM

## 2015-07-14 LAB — COMPREHENSIVE METABOLIC PANEL
ALBUMIN: 2.4 g/dL — AB (ref 3.5–5.0)
ALK PHOS: 317 U/L — AB (ref 38–126)
ALT: 68 U/L — ABNORMAL HIGH (ref 17–63)
AST: 139 U/L — AB (ref 15–41)
Anion gap: 11 (ref 5–15)
BILIRUBIN TOTAL: 15.7 mg/dL — AB (ref 0.3–1.2)
BUN: 12 mg/dL (ref 6–20)
CALCIUM: 9.6 mg/dL (ref 8.9–10.3)
CO2: 23 mmol/L (ref 22–32)
CREATININE: 0.86 mg/dL (ref 0.61–1.24)
Chloride: 100 mmol/L — ABNORMAL LOW (ref 101–111)
GFR calc Af Amer: >60 mL/min (ref >60)
GLUCOSE: 197 mg/dL — AB (ref 65–99)
Potassium: 4.6 mmol/L (ref 3.5–5.1)
Sodium: 134 mmol/L — ABNORMAL LOW (ref 135–145)
TOTAL PROTEIN: 7 g/dL (ref 6.5–8.1)

## 2015-07-14 LAB — GLUCOSE, CAPILLARY
Glucose-Capillary: 173 mg/dL — ABNORMAL HIGH (ref 65–99)
Glucose-Capillary: 181 mg/dL — ABNORMAL HIGH (ref 65–99)
Glucose-Capillary: 197 mg/dL — ABNORMAL HIGH (ref 65–99)
Glucose-Capillary: 170 mg/dL — ABNORMAL HIGH (ref 65–99)

## 2015-07-14 MED ORDER — SENNA 8.6 MG PO TABS
2.0000 | ORAL_TABLET | Freq: Every day | ORAL | Status: DC
  Administered 2015-07-14 – 2015-07-16 (×3): 17.2 mg via ORAL
  Filled 2015-07-14 (×3): qty 2

## 2015-07-14 MED ORDER — CAMPHOR-MENTHOL 0.5-0.5 % EX LOTN
TOPICAL_LOTION | CUTANEOUS | Status: DC | PRN
  Filled 2015-07-14 (×2): qty 222

## 2015-07-14 MED ORDER — CHOLESTYRAMINE LIGHT 4 G PO PACK
4.0000 g | PACK | Freq: Three times a day (TID) | ORAL | Status: DC | PRN
  Filled 2015-07-14: qty 1

## 2015-07-14 MED ORDER — MORPHINE SULFATE 15 MG PO TABS
30.0000 mg | ORAL_TABLET | Freq: Four times a day (QID) | ORAL | Status: DC | PRN
  Administered 2015-07-14 – 2015-07-17 (×6): 30 mg via ORAL
  Filled 2015-07-14 (×6): qty 2

## 2015-07-14 MED ORDER — GABAPENTIN 300 MG PO CAPS
300.0000 mg | ORAL_CAPSULE | Freq: Every day | ORAL | Status: DC
  Administered 2015-07-14 – 2015-07-16 (×3): 300 mg via ORAL
  Filled 2015-07-14 (×3): qty 1

## 2015-07-14 MED ORDER — MORPHINE SULFATE ER 15 MG PO TBCR
15.0000 mg | EXTENDED_RELEASE_TABLET | Freq: Three times a day (TID) | ORAL | Status: DC
  Administered 2015-07-14 – 2015-07-17 (×9): 15 mg via ORAL
  Filled 2015-07-14 (×9): qty 1

## 2015-07-14 MED ORDER — MORPHINE SULFATE (PF) 2 MG/ML IV SOLN: 1.0000 mg | INTRAVENOUS | Status: DC | PRN

## 2015-07-14 MED ORDER — GABAPENTIN 100 MG PO CAPS
100.0000 mg | ORAL_CAPSULE | Freq: Two times a day (BID) | ORAL | Status: DC
  Administered 2015-07-14 – 2015-07-17 (×7): 100 mg via ORAL
  Filled 2015-07-14 (×7): qty 1

## 2015-07-14 NOTE — Progress Notes (Signed)
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 It is not a primary hepatocellular carcinoma. This is a little unusual given the fact that the alpha-fetoprotein is so high.  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 There is nothing in the MRI that shows a pancreatic lesion. I suppose this could be an occult pancreatic cancer. Again I will think this to be highly unlikely.  He does have a PTC in now. Ray bilirubin is slow to come down. I'm not sure if the bilirubin will be able to come down enough for treatment. I am hoping for this.  I had a long talk with he and Ray Hall. I explained to him the diagnosis. I told him that we can treat this but that this is not curable as it is already outside the confines of the liver.  I told him that if we can treat him and treatment worked, that we might be looking at 12 months for prognosis. I think that if treatment does not work or if we cannot treat this, that we're looking at no more than 3-4 months. I really hated having to tell him this. However, he really wants to know what he was looking at.  I think that if we can treat him, we will clearly need to have a Port-A-Cath. Hopefully, interventional radiology can do this.  He seems to be eating maybe a little bit better. He is having some pain with the catheter was placed in Ray liver.  Yesterday, Ray bilirubin was 16. Ray liver function tests are getting a little bit better. Ray calcium is 9.5 with an albumin of 2.4 so this has to be watched.  He has had no fever. Ray vital signs all look stable. Ray blood pressure is 118/76.  I did explain to him that there is increased risk of blood clotting. I told him that the more he is hydrated and the more he is immature, this can help decrease the risk of blood clots.  On Ray exam, there really is not much change for I saw him couple days ago. Ray lungs are clear. Cardiac exam regular rate and rhythm. He may  have 1/6 systolic ejection murmur. Abdomen is soft. Abdomen is obese. Has the drainage catheter in the right quadrant. There is some slight tenderness in the right upper quadrant. I cannot palpate Ray liver. Ray spleen tip is non-palpable. Extremities shows the left BKA.  There may be some trace edema in the right leg. Neurological exam is nonfocal.  I really, really hate that Ray Hall has to deal with this. I must say that this is a unusual type of malignancy. 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 Even if this is an occult pancreatic cancer, treatment really is similar.  I think the question is when he can be discharged., I am not sure as to what else is going be done with him as an inpatient. He does need to have a Port-A-Cath placed. It would be nice of this can be done as inpatient.  We will continue to follow him. I will be more than happy to follow him up as an outpatient. Again, he really needs to have that Port-A-Cath placed. Ray blood sugars need to be under good control. Hopefully, Ray bilirubin will continue to decline.  I will pray hard for him and Ray family. They are very nice. I am glad that they have a strong faith.  Ray Hall  1:5 

## 2015-07-14 NOTE — Progress Notes (Signed)
Pt with continued reports of itching with Prevalite unsuccessful in relief. Page to [facility] for notification.
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
## 2015-07-14 NOTE — Progress Notes (Signed)
Referring Physician(s): Dr Dossie Arbour  Supervising Physician:  Jacqulynn Cadet MD  Chief Complaint: Internal/ext Biliary drain placed 07/12/15 Obstructive jaundice  Subjective:  Pt painful at site, but much better.  No N/V, tolerating diet Family at bedside Oncology note reviewed   Allergies: Infed and Lactose intolerance (gi)  Medications:  Current facility-administered medications:  .  aspirin EC tablet 81 mg, 81 mg, Oral, Daily, Smiley Houseman, MD, 81 mg at 07/13/15 1014 .  cholestyramine light (PREVALITE) packet 4 g, 4 g, Oral, TID, Volanda Napoleon, MD, 4 g at 07/13/15 2100 .  enoxaparin (LOVENOX) injection 40 mg, 40 mg, Subcutaneous, QHS, Monia Sabal, PA-C, 40 mg at 07/13/15 2100 .  feeding supplement (ENSURE ENLIVE) (ENSURE ENLIVE) liquid 237 mL, 237 mL, Oral, BID BM, Lupita Dawn, MD, 237 mL at 07/12/15 1609 .  gabapentin (NEURONTIN) capsule 100 mg, 100 mg, Oral, TID, Smiley Houseman, MD, 100 mg at 07/13/15 2100 .  Influenza vac split quadrivalent PF (FLUARIX) injection 0.5 mL, 0.5 mL, Intramuscular, Once, Lupita Dawn, MD, 0.5 mL at 07/08/15 1000 .  insulin aspart (novoLOG) injection 0-9 Units, 0-9 Units, Subcutaneous, TID WC, Smiley Houseman, MD, 2 Units at 07/14/15 0900 .  lisinopril (PRINIVIL,ZESTRIL) tablet 40 mg, 40 mg, Oral, Daily, Smiley Houseman, MD, 40 mg at 07/13/15 1014 .  morphine (MS CONTIN) 12 hr tablet 15 mg, 15 mg, Oral, Q12H, Smiley Houseman, MD, 15 mg at 07/14/15 0124 .  morphine (MSIR) tablet 30 mg, 30 mg, Oral, Q6H PRN, Ricka Burdock, RPH .  morphine 2 MG/ML injection 1 mg, 1 mg, Intravenous, Q2H PRN, Rogue Bussing, MD .  ondansetron [facility]) tablet 8 mg, 8 mg, Oral, Q6H PRN, 8 mg at 07/14/15 0738 **OR** ondansetron (ZOFRAN) 8 mg in sodium chloride 0.9 % 50 mL IVPB, 8 mg, Intravenous, Q6H PRN, Smiley Houseman, MD .  pneumococcal 23 valent vaccine (PNU-IMMUNE) injection 0.5 mL, 0.5 mL, Intramuscular, Once,  Lupita Dawn, MD, 0.5 mL at 07/08/15 1000    Vital Signs: BP 135/81 mmHg  Pulse 99  Temp(Src) 97.8 F (36.6 C) (Oral)  Resp 18  Ht 6\' 7"  (2.007 m)  Wt 276 lb (125.193 kg)  BMI 31.08 kg/m2  SpO2 99%  Physical Exam  Constitutional: He appears well-developed.  Abdominal: Soft. Bowel sounds are normal.  Skin: Skin is warm and dry. He is not diaphoretic.  Site is clean and dry Mildly yender No bleeding No hematoma Output bilious with significant debris. Bag full  Nursing note and vitals reviewed.   Imaging: Ct Abdomen Wo Contrast  07/13/2015  CLINICAL DATA:  Right upper quadrant pain. Biliary drain placed yesterday 05/01/2013. History of diabetes, hypertension, sickle cell trait, neurogenic bladder. EXAM: CT ABDOMEN WITHOUT CONTRAST TECHNIQUE: Multidetector CT imaging of the abdomen was performed following the standard protocol without IV contrast. COMPARISON:  07/08/2015 FINDINGS: Lower chest: There is bibasilar atelectasis. Small right middle lobe pulmonary nodule is 4 mm on image 5 of series 3. A second right middle lobe pulmonary nodule is only partially imaged on image 1 of series 3 and measures at least 4 mm. Upper abdomen: Numerous low-attenuation lesions are again identified throughout the liver, better identified on previous contrast-enhanced exam. Largest, confluent mass in the left hepatic lobe is at least 10 cm in diameter. The liver is enlarged by the lesions, measuring 23 cm in craniocaudal length. Percutaneous right-sided biliary drain is in place. There is residual contrast within right lobe  biliary ducts. Layering high attenuation material within the gallbladder, consistent with stones or sludge. Presence of the material in the gallbladder demonstrates a thickened gallbladder wall. No focal abnormality identified within the spleen or pancreas. There is bilateral hydronephrosis, left greater than right. There is residual contrast in the collecting system possibly related to  urinary stasis. Adrenal glands are normal in appearance. Gastrointestinal tract: The stomach and small bowel loops are normal in appearance. The appendix is well seen and has a normal appearance. Visualized colonic loops are normal in appearance. Pelvis: Only partially imaged. There is a small amount of ascites in the right pericolic gutter. The partially imaged urinary bladder contains contrast from recent CT exam. Retroperitoneum: Enlarged nodes are identified in the region of porta hepatis. No evidence for aortic aneurysm. Abdominal wall: Gynecomastia is present. 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 No suspicious lytic or blastic lesions are identified. IMPRESSION: 1. Interval placement of biliary drain. 2. Thickened gallbladder wall. 3. Bilateral hydronephrosis, left greater than right. 4. Residual urinary tract contrast, likely related to urinary stasis. 5. Normal appendix. 6. Numerous hepatic lesions. Electronically Signed   By: Nolon Nations M.D.   On: 07/13/2015 09:59   Mr Liver W Wo Contrast  07/11/2015  CLINICAL DATA:  Painless jaundice, multiple liver masses EXAM: MRI ABDOMEN WITHOUT AND WITH CONTRAST TECHNIQUE: Multiplanar multisequence MR imaging of the abdomen was performed both before and after the administration of intravenous contrast. CONTRAST:  58mL MULTIHANCE GADOBENATE DIMEGLUMINE 529 MG/ML IV SOLN COMPARISON:  CT abdomen pelvis dated 07/08/2015 FINDINGS: Lower chest:  Lung bases are clear. Hepatobiliary: Dominant 8.8 x 9.3 cm hypoenhancing mass centered in the medial segment left hepatic lobe (series 1302/ image 22). Innumerable additional smaller hypoenhancing metastases in both hepatic lobes. For example: --1.8 cm lesion in the anterior right hepatic dome (series 1302/ image 7) --2.6 cm lesion in the medial segment left hepatic lobe (series 1302/ image 21 --2.3 cm lesion in the lateral segment left hepatic lobe (series  1302, image 2/image 31) --2.9 cm lesion in the lateral segment left hepatic lobe (series 1302, image 2/ image 34) --1.8 cm lesion inferiorly in the posterior segment right hepatic lobe (series 1302/ image 52) Gallbladder is notable for excretory contrast. No intrahepatic or extrahepatic ductal dilatation. Pancreas: Within normal limits. Spleen: Within normal limits. Adrenals/Urinary Tract: Adrenal glands are within normal limits. Right kidney is within normal limits. Moderate left hydroureteronephrosis, unchanged. Stomach/Bowel: Stomach is within normal limits. Visualized bowel is unremarkable. Vascular/Lymphatic: No evidence of abdominal aortic aneurysm. Portal vein is patent. Necrotic upper abdominal/retroperitoneal lymphadenopathy, including: --1.6 cm short axis peripancreatic node (series 1303/image 23) --1.9 cm short axis node in the porta hepatis (series 1303/ image 29) --1.7 cm short axis node in the porta hepatis (series 1303/ image 36) --2.3 cm short axis portacaval node (series 1303/ image 40) --1.7 cm short axis retrocaval node (series 1303/image 53) Other: No abdominal ascites. Musculoskeletal: No focal osseous lesions. IMPRESSION: Dominant 9.3 cm mass centered in the medial segment left hepatic lobe. Innumerable additional smaller hypotensive metastases in both hepatic lobes. Necrotic upper abdominal/retroperitoneal lymphadenopathy, as above. Correlate with biopsy results. 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 Electronically Signed   By: Julian Hy M.D.   On: 07/11/2015 09:21   Ir Int Lianne Cure Biliary Drain With Cholangiogram  07/12/2015  INDICATION: Large an multifocal hepatic mass is centered in the left lobe and central liver causing biliary obstruction. This presumably represents  cholangiocarcinoma. Final results of a percutaneous biopsy performed on 07/10/2015 are pending. The patient requires percutaneous biliary drainage.  EXAM: PERCUTANEOUS INTERNAL/EXTERNAL BILIARY DRAINAGE CATHETER PLACEMENT WITH CHOLANGIOGRAM COMPARISON:  MRI of the abdomen on 07/11/2015 and CT of the abdomen on 07/08/2015. MEDICATIONS: 3.375 g IV Zosyn CONTRAST:  35mL OMNIPAQUE IOHEXOL 300 MG/ML  SOLN ANESTHESIA/SEDATION: 8.0 mg IV Versed, 200 mcg IV fentanyl Total Moderate Sedation Time 75 minutes. The patient's level of consciousness and physiologic status were continuously monitored during the procedure by Radiology nursing. FLUOROSCOPY TIME:  11 minutes. COMPLICATIONS: None TECHNIQUE: Informed written consent was obtained from [place] after a discussion of the risks, benefits and alternatives to treatment. Questions regarding the procedure were encouraged and answered. A timeout was performed prior to the initiation of the procedure. The right upper abdominal quadrant was prepped and draped in the usual sterile fashion, and a sterile drape was applied covering the operative field. Maximum barrier sterile technique with sterile gowns and gloves were used for the procedure. A timeout was performed prior to the initiation of the procedure. Ultrasound of the liver was performed to delineate bile ducts in the right lobe of the liver. After the overlying soft tissues were anesthetized with 1% Lidocaine, a 21 gauge Chiba needle was utilized to opacify the peripheral aspect of an anterior right hepatic duct. Cholangiogram was performed. Guidewire was advanced. Additional percutaneous puncture was then performed under fluoroscopic guidance of a right intrahepatic bile duct with a 21 gauge needle. A guidewire was advanced through the needle. A transitional micropuncture dilator was then advanced. Over a guidewire, a 5 French catheter was advanced into the central bile ducts. This catheter was further advanced into the common bile duct and duodenum over a hydrophilic guidewire. Over an Amplatz stiff wire, the tract was dilated and a 10.2 [place] biliary drainage  catheter was advanced with coil ultimately locked within the duodenum. Contrast was injected and a completion radiograph was obtained. The catheter was connected to a drainage bag which yielded the brisk return of clear bile. The catheter was secured to the skin with a Prolene retention suture and StatLock device. FINDINGS: After right lobe bile duct puncture and contrast injection, cholangiography shows a high-grade central biliary obstruction at the confluence of right lobe and left lobe ducts extending into central ducts. The left lobe ductal system was never opacified and may be completely obstructed by tumor. The level of obstruction was able to be crossed with a guidewire and catheter allowing placement of the internal/external biliary drain down to the level of the duodenum. The distal portion of the catheter was formed in the duodenum with sideholes extending up the common bile duct and into the central right lobe of the liver. IMPRESSION: After right lobe ductal access, cholangiography demonstrates a high-grade central biliary obstruction extending into intrahepatic right lobe ducts. The left lobe ductal system was not opacified and presumably completely obstructed by tumor. A 10 French internal/external biliary drainage catheter was able to be advanced through the level of obstruction and to the duodenum. This tube will be left to gravity bag drainage. Electronically Signed   By: Aletta Edouard M.D.   On: 07/12/2015 17:26    Labs:  CBC:  Recent Labs  07/10/15 0708 07/11/15 0610 07/12/15 1016 07/13/15 0449  WBC 8.8 8.9 9.2 9.8  HGB 9.6* 9.3* 9.6* 10.1*  HCT 27.9* 27.3* 27.0* 28.7*  PLT 481* 399 377 371    COAGS:  Recent Labs  12/31/14 0131 07/07/15 1848 07/12/15  0529  INR 1.18 1.11 1.28    BMP:  Recent Labs  07/11/15 0610 07/12/15 0838 07/13/15 0450 07/14/15 0745  NA 136 134* 136 134*  K 4.3 5.2* 4.5 4.6  CL 103 105 102 100*  CO2 20* 15* 21* 23  GLUCOSE 161* 165*  172* 197*  BUN 11 11 7 12   CALCIUM 9.4 9.6 9.5 9.6  CREATININE 0.80 0.79 0.83 0.86  GFRNONAA >60 >60 >60 >60  GFRAA >60 >60 >60 >60    LIVER FUNCTION TESTS:  Recent Labs  07/11/15 0610 07/12/15 0838 07/13/15 0450 07/14/15 0745  BILITOT 15.3* 17.3* 16.0* 15.7*  AST 145* 173* 151* 139*  ALT 60 75* 69* 68*  ALKPHOS 320* 364* 312* 317*  PROT 6.8 7.7 7.2 7.0  ALBUMIN 2.4* 2.7* 2.4* 2.4*    Assessment and Plan: Liver lesions Obstructive jaundice Int/Ext biliary drain placed 3/15 in IR Slow decrease in T. Bili Will need port at some point per Oncology. IR continuing to follow, unsure if will need additional left side bili drain. If pt still inpt and labs/status better early next week, can consider port placement. If pt otherwise discharged, will need to set up Port to be done as an outpt.  Electronically Signed: Ascencion Dike 07/14/2015, 10:38 AM   I spent a total of 15 Minutes at the the patient's bedside AND on the patient's hospital floor or unit, greater than 50% of which was counseling/coordinating care for Biliary drain placement

## 2015-07-14 NOTE — Consult Note (Signed)
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 He has been seen by Dr. Burney Gauze  today. Interventional radiology has placed a percutaneous drain into his liver in hopes of bringing his bilirubin down. He has been having severe pain to the right upper quadrant where the drain site is. Patient has chronic pain  with this new acute pain, both neuropathic pain from diabetes as well as phantom limb pain, he has left below the knee amputee, as well as chronic back pain and left shoulder pain. He was started on MS Contin on the 13th, with 15 mg of immediate release morphine for breakthrough pain, as well as 2 mg IV morphine when necessary. He states the IV morphine was more effective, but this is been stopped in anticipation of being discharged after a Port-A-Cath is put in on Monday. They have the immediate release morphine to 30 mg every 6 hours as needed. Patient however is an individual that struggles with wanting to take as few meds as possible and is asking for the medicine when his pain which isn't 8 and is thus not recognizing come resolution or improvement of his pain. He rates his pain at an 8 currently and states the best it's been overnight is a 2. His last dose of breakthrough pain medicine was at 7 this morning. He also was on Neurontin 200 mg 3 times a day prior to being admitted to the hospital. That dosage has been reduced to 200 mg 3 times a day. He reports that this pain is much worse at night and is  now keeping him awake. He also reports that his right upper quadrant pain is worse with movement. He states he has seen what he thinks his blood in the drainage bag when he attempts to move. He denies any bleeding around the insertion site. He also is experiencing pruritus likely from both the opioids as well as metastatic disease to the liver. He was started on Cholestyramine but reports that the itching is actually worse with that. Additionally he has not had a bowel movement in 3 days  Contacts/Participants in Discussion: Primary Decision Maker: Patient   Relationship to Patient not applicable HCPOA: yes  Patient is married and would assume responsibility for healthcare decisions if patient was unable to speak for himself  SUMMARY OF RECOMMENDATIONS Patient and family after meeting with Dr. Marin Olp are hopeful for pursuing more treatment to prolong his life He remains a full code and is planning to get a Port-A-Cath placed on Monday and to go for further treatment  Code Status/Advance Care Planning: Full code    Code Status Orders        Start     Ordered   07/07/15 2300  Full code   Continuous     07/07/15 2302    Code Status History    Date Active Date Inactive Code Status Order ID Comments User Context   02/24/2015  8:13 PM 03/03/2015  6:16 PM Full Code [id number]  Elberta Leatherwood, MD Inpatient   12/31/2014  3:51 PM 01/03/2015  7:07 PM Full Code [id number]  Newt Minion,  MD Inpatient   12/29/2014 12:30 PM 12/31/2014  3:51 PM Full Code [id number]  Katheren Shams, DO Inpatient   12/16/2014  7:55 PM 12/20/2014  6:47 PM Full Code [id number]  Veatrice Bourbon, MD Inpatient   03/02/2014  8:26 PM 03/05/2014  6:56 PM Full Code [id number]  Lorna Few, DO Inpatient   08/27/2013  2:37 AM 08/30/2013  9:11 PM Full Code [id number]  Coral Spikes, DO Inpatient      Other Directives:None  Symptom Management:   Pain: Patient has complex pain in that he has now acute on chronic pain, neuropathic as well as  phantom limb pain. He is a left below the knee amputee, chronic neuropathy as well as chronic back and left shoulder pain. Staffed with attending as well as patient and family and agreed to increase his long-acting morphine to 15 mg every 8 hours, and continue morphine immediate release 30 mg every 6 hours as needed. Patient seems to be struggling with plan to ask for medicine and unfortunately is waiting to long before asking and his pain cycles into a crisis. For his neuropathic pain will continue the gabapentin at 100 mg twice a day increase his bedtime dose to 300 mg.  Constipation: Patient has not had a bowel movement in 3 days. We'll start senna 2 tabs daily as well as Dulcolax suppository when necessary  Pruritus: Patient is currently on cholestyramine for the liver salts itching, opioids likely contributing to this as well. Patient states the itching has actually gotten worse with the cholestyramine, but states he was told it was for blood clots. Reviewed cholestyramine and its mechanism of action with the attending as well as literature and will change to as needed basis for management of itching only. This does not impact coagulopathy in any way. We'll order Benadryl when necessary as well as Sarna lotion.  Palliative Prophylaxis:   Bowel Regimen, Frequent Pain Assessment and Turn Reposition  Additional Recommendations (Limitations, Scope, Preferences):  Full Scope Treatment  Psycho-social/Spiritual:  Support System: Strong Desire for further Chaplaincy support:no Additional Recommendations: Grief/Bereavement Support  Prognosis: < 12 months  Discharge Planning: Home   Chief Complaint/ Primary Diagnoses: Present on Admission:  . Hyperbilirubinemia . Essential hypertension . Diabetic foot ulcer ([place]) . Atrial flutter ([place]) . Microcytic anemia . Neurogenic bladder  I have reviewed the medical record, interviewed the patient and family, and examined the patient. The following  aspects are pertinent.  Past Medical History  Diagnosis Date  . Sickle cell trait ([place])   . Normal nuclear stress test 07/2010    low prob of ischemia, EF 42%  . Echocardiogram abnormal     moderate LVH, mild LV hypokenesis, EF 42%  . History of Doppler ultrasound 07/2010    negative for DVT  . Abnormal CT of the abdomen     mild L hydronephrosis and hydroureter  . Status post radiofrequency ablation for arrhythmia 10/16/10    [facility] MD)  . Diabetes mellitus   . Hypertension   . Osteomyelitis ([place]) 12/2014    LEFT FOOT  . Microcytic anemia 02/25/2015  . 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 Followed at [facility] urology.    Social History   Social History  . Marital Status: Married    Spouse Name: N/A  . Number of Children: N/A  . Years of Education: N/A   Social History Main Topics  . Smoking status: Former Smoker -- 1.50 packs/day for 17  years    Types: Cigars    Quit date: 12/15/2014  . Smokeless tobacco: Never Used     Comment: 2 cigars  . Alcohol Use: No  . Drug Use: No  . Sexual Activity: Not Asked   Other Topics Concern  . None   Social History Narrative   Family History  Problem Relation Age of Onset  . Sickle cell trait    . Heart failure Father   . Diabetes Father   . Diabetes Mother   . Hypertension Mother    Scheduled Meds: . aspirin EC  81 mg Oral Daily  . cholestyramine light  4 g Oral TID  . enoxaparin (LOVENOX) injection  40 mg Subcutaneous QHS  . feeding supplement (ENSURE ENLIVE)  237 mL Oral BID BM  . gabapentin  100 mg Oral TID  . Influenza vac split quadrivalent PF  0.5 mL Intramuscular Once  . insulin aspart  0-9 Units Subcutaneous TID WC  . lisinopril  40 mg Oral Daily  . morphine  15 mg Oral Q12H  . pneumococcal 23 valent vaccine  0.5 mL Intramuscular Once   Continuous Infusions:  PRN Meds:.morphine, morphine injection, ondansetron **OR** ondansetron (ZOFRAN) IV Medications Prior to  Admission:  Prior to Admission medications   Medication Sig Start Date End Date Taking? Authorizing Provider  aspirin EC 81 MG tablet Take 1 tablet (81 mg total) by mouth daily. 01/03/15  Yes Alyssa A Haney, MD  diphenhydramine-acetaminophen (TYLENOL PM) 25-500 MG TABS Take 2 tablets by mouth at bedtime.   Yes Historical Provider, MD  ENSURE (ENSURE) Take 237 mLs by mouth 2 (two) times daily between meals.   Yes Historical Provider, MD  Ferrous Sulfate (IRON) 325 (65 FE) MG TABS Take 325 mg by mouth once. 03/03/15  Yes Asiyah Cletis Media, MD  gabapentin (NEURONTIN) 100 MG capsule TAKE ONE CAPSULE BY MOUTH THREE TIMES DAILY 05/24/15  Yes Leone Brand, MD  glipiZIDE (GLUCOTROL) 10 MG tablet TAKE ONE TABLET BY MOUTH TWICE DAILY BEFORE MEAL(S) 09/07/14  Yes Leone Brand, MD  lisinopril (PRINIVIL,ZESTRIL) 40 MG tablet TAKE ONE TABLET BY MOUTH ONCE DAILY 03/31/15  Yes Leone Brand, MD  metFORMIN (GLUCOPHAGE) 1000 MG tablet Take 1 tablet (1,000 mg total) by mouth 2 (two) times daily with a meal. 09/07/14  Yes Leone Brand, MD  naproxen sodium (ANAPROX) 220 MG tablet Take 440 mg by mouth daily as needed (general pain).   Yes Historical Provider, MD   Allergies  Allergen Reactions  . Infed [Iron Dextran] Anaphylaxis    Rapid response called - trouble breathing and hypotensive. Treated with 1 Epi, SoluMedrol, and Benadryl.   . Lactose Intolerance (Gi) Other (See Comments)    Gas & heartburn    Review of Systems  Constitutional: Positive for activity change, fatigue and unexpected weight change.  Eyes:       Jaundice  Respiratory: Negative.   Cardiovascular: Negative.   Endocrine: Negative.   Genitourinary: Positive for flank pain.  Musculoskeletal: Positive for back pain.  Allergic/Immunologic: Negative.   Neurological: Positive for weakness.  Psychiatric/Behavioral: Negative.     Physical Exam  Nursing note and vitals reviewed. Constitutional: He is oriented to person, place, and time.  He appears well-nourished.  HENT:  Head: Normocephalic and atraumatic.  Eyes:  jaundiced  Neck: Normal range of motion.  Respiratory: Effort normal.  GI: He exhibits distension. There is tenderness.  Pain at right upper quadrant drain site  Musculoskeletal: Normal range of motion.  Neurological: He is alert and oriented to person, place, and time.  Skin: Skin is warm and dry.  Continually scratching  Psychiatric: He has a normal mood and affect.    Vital Signs: BP 135/81 mmHg  Pulse 99  Temp(Src) 97.8 F (36.6 C) (Oral)  Resp 18  Ht 6\' 7"  (2.007 m)  Wt 125.193 kg (276 lb)  BMI 31.08 kg/m2  SpO2 99%  SpO2: SpO2: 99 % O2 Device:SpO2: 99 % O2 Flow Rate: .O2 Flow Rate (L/min): 2 L/min  IO: Intake/output summary:  Intake/Output Summary (Last 24 hours) at 07/14/15 1511 Last data filed at 07/14/15 1120  Gross per 24 hour  Intake    460 ml  Output   2745 ml  Net  -2285 ml    LBM: Last BM Date: 07/12/15 Baseline Weight: Weight: 121.11 kg (267 lb) Most recent weight: Weight: 125.193 kg (276 lb)      Palliative Assessment/Data:  Flowsheet Rows        Most Recent Value   Intake Tab    Referral Department  Hospitalist   Unit at Time of Referral  Med/Surg Unit   Palliative Care Primary Diagnosis  Cancer   Date Notified  07/14/15   Palliative Care Type  New Palliative care   Reason for referral  Pain, Clarify Goals of Care   Date of Admission  07/07/15   Date first seen by Palliative Care  07/14/15   # of days Palliative referral response time  0 Day(s)   # of days IP prior to Palliative referral  7   Clinical Assessment    Palliative Performance Scale Score  40%   Pain Min Last 24 hours  2   Dyspnea Max Last 24 Hours  0   Dyspnea Min Last 24 hours  0   Nausea Max Last 24 Hours  0   Nausea Min Last 24 Hours  0   Anxiety Max Last 24 Hours  0   Anxiety Min Last 24 Hours  0   Other Max Last 24 Hours  0   Psychosocial & Spiritual Assessment    Palliative Care Outcomes     Patient/Family meeting held?  Yes   Who was at the meeting?  wife, mother, daughters   Palliative Care follow-up planned  Yes, Facility      Additional Data Reviewed:  CBC:    Component Value Date/Time   WBC 9.8 07/13/2015 0449   HGB 10.1* 07/13/2015 0449   HCT 28.7* 07/13/2015 0449   PLT 371 07/13/2015 0449   MCV 68.3* 07/13/2015 0449   NEUTROABS 8.7* 12/31/2014 0131   LYMPHSABS 3.2 12/31/2014 0131   MONOABS 0.9 12/31/2014 0131   EOSABS 0.1 12/31/2014 0131   BASOSABS 0.0 12/31/2014 0131   Comprehensive Metabolic Panel:    Component Value Date/Time   NA 134* 07/14/2015 0745   K 4.6 07/14/2015 0745   CL 100* 07/14/2015 0745   CO2 23 07/14/2015 0745   BUN 12 07/14/2015 0745   CREATININE 0.86 07/14/2015 0745   CREATININE 0.79 12/23/2014 1503   GLUCOSE 197* 07/14/2015 0745   CALCIUM 9.6 07/14/2015 0745   AST 139* 07/14/2015 0745   ALT 68* 07/14/2015 0745   ALKPHOS 317* 07/14/2015 0745   BILITOT 15.7* 07/14/2015 0745   PROT 7.0 07/14/2015 0745   ALBUMIN 2.4* 07/14/2015 0745     Time In: 1430 Time Out: 1545 Time Total: 75 min Greater than 50%  of this time was spent counseling and coordinating care related  to the above assessment and plan.Staffed with attending  Signed by: Dory Horn, NP  Dory Horn, NP  07/14/2015, 3:11 PM  Please contact Palliative Medicine Team phone at [phone] for questions and concerns.

## 2015-07-15 DIAGNOSIS — Z515 Encounter for palliative care: Secondary | ICD-10-CM | POA: Diagnosis not present

## 2015-07-15 DIAGNOSIS — C221 Intrahepatic bile duct carcinoma: Secondary | ICD-10-CM | POA: Diagnosis not present

## 2015-07-15 DIAGNOSIS — C787 Secondary malignant neoplasm of liver and intrahepatic bile duct: Secondary | ICD-10-CM | POA: Diagnosis not present

## 2015-07-15 LAB — COMPREHENSIVE METABOLIC PANEL
ALBUMIN: 2.6 g/dL — AB (ref 3.5–5.0)
ALK PHOS: 338 U/L — AB (ref 38–126)
ALT: 73 U/L — AB (ref 17–63)
ALT: 64 U/L — ABNORMAL HIGH (ref 17–63)
AST: 166 U/L — AB (ref 15–41)
AST: 152 U/L — AB (ref 15–41)
Albumin: 2.3 g/dL — ABNORMAL LOW (ref 3.5–5.0)
Alkaline Phosphatase: 297 U/L — ABNORMAL HIGH (ref 38–126)
Anion gap: 13 (ref 5–15)
Anion gap: 13 (ref 5–15)
BILIRUBIN TOTAL: 17.1 mg/dL — AB (ref 0.3–1.2)
BILIRUBIN TOTAL: 15.2 mg/dL — AB (ref 0.3–1.2)
BUN: 14 mg/dL (ref 6–20)
BUN: 17 mg/dL (ref 6–20)
CHLORIDE: 98 mmol/L — AB (ref 101–111)
CO2: 23 mmol/L (ref 22–32)
CO2: 23 mmol/L (ref 22–32)
CREATININE: 0.96 mg/dL (ref 0.61–1.24)
CREATININE: 1.18 mg/dL (ref 0.61–1.24)
Calcium: 9.8 mg/dL (ref 8.9–10.3)
Calcium: 9.4 mg/dL (ref 8.9–10.3)
Chloride: 97 mmol/L — ABNORMAL LOW (ref 101–111)
GFR calc Af Amer: >60 mL/min (ref >60)
GLUCOSE: 157 mg/dL — AB (ref 65–99)
Glucose, Bld: 208 mg/dL — ABNORMAL HIGH (ref 65–99)
POTASSIUM: 4.9 mmol/L (ref 3.5–5.1)
Potassium: 4.3 mmol/L (ref 3.5–5.1)
Sodium: 133 mmol/L — ABNORMAL LOW (ref 135–145)
Sodium: 134 mmol/L — ABNORMAL LOW (ref 135–145)
TOTAL PROTEIN: 8.1 g/dL (ref 6.5–8.1)
TOTAL PROTEIN: 7.2 g/dL (ref 6.5–8.1)

## 2015-07-15 LAB — GLUCOSE, CAPILLARY
GLUCOSE-CAPILLARY: 202 mg/dL — AB (ref 65–99)
GLUCOSE-CAPILLARY: 185 mg/dL — AB (ref 65–99)
GLUCOSE-CAPILLARY: 248 mg/dL — AB (ref 65–99)
GLUCOSE-CAPILLARY: 185 mg/dL — AB (ref 65–99)

## 2015-07-15 LAB — CBC
HEMATOCRIT: 31.3 % — AB (ref 39.0–52.0)
HEMOGLOBIN: 11.1 g/dL — AB (ref 13.0–17.0)
MCH: 24.4 pg — ABNORMAL LOW (ref 26.0–34.0)
MCHC: 35.5 g/dL (ref 30.0–36.0)
MCV: 68.8 fL — AB (ref 78.0–100.0)
Platelets: 429 10*3/uL — ABNORMAL HIGH (ref 150–400)
RBC: 4.55 MIL/uL (ref 4.22–5.81)
RDW: 19.3 % — ABNORMAL HIGH (ref 11.5–15.5)
WBC: 10.2 10*3/uL (ref 4.0–10.5)

## 2015-07-15 LAB — CANCER ANTIGEN 19-9: CA 19-9: 285 U/mL — ABNORMAL HIGH (ref 0–35)

## 2015-07-15 LAB — CEA: CEA: 5.4 ng/mL — AB (ref 0.0–4.7)

## 2015-07-15 MED ORDER — MORPHINE SULFATE (PF) 2 MG/ML IV SOLN
1.0000 mg | INTRAVENOUS | Status: DC | PRN
  Administered 2015-07-15 – 2015-07-16 (×2): 1 mg via INTRAVENOUS
  Filled 2015-07-15 (×2): qty 1

## 2015-07-15 NOTE — Progress Notes (Signed)
Patient ID: Ray Hall, male   DOB: [birth year], 55 y.o.   MRN: [id number]    Referring Physician(s): Dr. Dossie Arbour  Supervising Physician: Markus Daft  Chief Complaint: Internal/ext Biliary drain placed 07/12/15 Obstructive jaundice  Subjective: Pt still having a lot of pain near his drain site.  Allergies: Infed and Lactose intolerance (gi)  Medications: Prior to Admission medications   Medication Sig Start Date End Date Taking? Authorizing Provider  aspirin EC 81 MG tablet Take 1 tablet (81 mg total) by mouth daily. 01/03/15  Yes Alyssa A Haney, MD  diphenhydramine-acetaminophen (TYLENOL PM) 25-500 MG TABS Take 2 tablets by mouth at bedtime.   Yes Historical Provider, MD  ENSURE (ENSURE) Take 237 mLs by mouth 2 (two) times daily between meals.   Yes Historical Provider, MD  Ferrous Sulfate (IRON) 325 (65 FE) MG TABS Take 325 mg by mouth once. 03/03/15  Yes Asiyah Cletis Media, MD  gabapentin (NEURONTIN) 100 MG capsule TAKE ONE CAPSULE BY MOUTH THREE TIMES DAILY 05/24/15  Yes Leone Brand, MD  glipiZIDE (GLUCOTROL) 10 MG tablet TAKE ONE TABLET BY MOUTH TWICE DAILY BEFORE MEAL(S) 09/07/14  Yes Leone Brand, MD  lisinopril (PRINIVIL,ZESTRIL) 40 MG tablet TAKE ONE TABLET BY MOUTH ONCE DAILY 03/31/15  Yes Leone Brand, MD  metFORMIN (GLUCOPHAGE) 1000 MG tablet Take 1 tablet (1,000 mg total) by mouth 2 (two) times daily with a meal. 09/07/14  Yes Leone Brand, MD  naproxen sodium (ANAPROX) 220 MG tablet Take 440 mg by mouth daily as needed (general pain).   Yes Historical Provider, MD    Vital Signs: BP 110/73 mmHg  Pulse 74  Temp(Src) 98.7 F (37.1 C) (Oral)  Resp 17  Ht 6\' 7"  (2.007 m)  Wt 276 lb (125.193 kg)  BMI 31.08 kg/m2  SpO2 99%  Physical Exam: Abd: soft, but very tender around his drain.  Drain site is c/d/i/  Drain with copious amounts of thick bilious output.  Imaging: Ct Abdomen Wo Contrast  07/13/2015  CLINICAL DATA:  Right upper quadrant pain. Biliary  drain placed yesterday 05/01/2013. History of diabetes, hypertension, sickle cell trait, neurogenic bladder. EXAM: CT ABDOMEN WITHOUT CONTRAST TECHNIQUE: Multidetector CT imaging of the abdomen was performed following the standard protocol without IV contrast. COMPARISON:  07/08/2015 FINDINGS: Lower chest: There is bibasilar atelectasis. Small right middle lobe pulmonary nodule is 4 mm on image 5 of series 3. A second right middle lobe pulmonary nodule is only partially imaged on image 1 of series 3 and measures at least 4 mm. Upper abdomen: Numerous low-attenuation lesions are again identified throughout the liver, better identified on previous contrast-enhanced exam. Largest, confluent mass in the left hepatic lobe is at least 10 cm in diameter. The liver is enlarged by the lesions, measuring 23 cm in craniocaudal length. Percutaneous right-sided biliary drain is in place. There is residual contrast within right lobe biliary ducts. Layering high attenuation material within the gallbladder, consistent with stones or sludge. Presence of the material in the gallbladder demonstrates a thickened gallbladder wall. No focal abnormality identified within the spleen or pancreas. There is bilateral hydronephrosis, left greater than right. There is residual contrast in the collecting system possibly related to urinary stasis. Adrenal glands are normal in appearance. Gastrointestinal tract: The stomach and small bowel loops are normal in appearance. The appendix is well seen and has a normal appearance. Visualized colonic loops are normal in appearance. Pelvis: Only partially imaged. There is a small amount of ascites  in the right pericolic gutter. The partially imaged urinary bladder contains contrast from recent CT exam. Retroperitoneum: Enlarged nodes are identified in the region of porta hepatis. No evidence for aortic aneurysm. Abdominal wall: Gynecomastia is present. Recannulized umbilical vein Osseous structures:  Degenerative disc disease of the thoracolumbar spine. No suspicious lytic or blastic lesions are identified. IMPRESSION: 1. Interval placement of biliary drain. 2. Thickened gallbladder wall. 3. Bilateral hydronephrosis, left greater than right. 4. Residual urinary tract contrast, likely related to urinary stasis. 5. Normal appendix. 6. Numerous hepatic lesions. Electronically Signed   By: Nolon Nations M.D.   On: 07/13/2015 09:59   Ir Int Lianne Cure Biliary Drain With Cholangiogram  07/12/2015  INDICATION: Large an multifocal hepatic mass is centered in the left lobe and central liver causing biliary obstruction. This presumably represents cholangiocarcinoma. Final results of a percutaneous biopsy performed on 07/10/2015 are pending. The patient requires percutaneous biliary drainage. EXAM: PERCUTANEOUS INTERNAL/EXTERNAL BILIARY DRAINAGE CATHETER PLACEMENT WITH CHOLANGIOGRAM COMPARISON:  MRI of the abdomen on 07/11/2015 and CT of the abdomen on 07/08/2015. MEDICATIONS: 3.375 g IV Zosyn CONTRAST:  82mL OMNIPAQUE IOHEXOL 300 MG/ML  SOLN ANESTHESIA/SEDATION: 8.0 mg IV Versed, 200 mcg IV fentanyl Total Moderate Sedation Time 75 minutes. The patient's level of consciousness and physiologic status were continuously monitored during the procedure by Radiology nursing. FLUOROSCOPY TIME:  11 minutes. COMPLICATIONS: None TECHNIQUE: Informed written consent was obtained from [place], [place] after a discussion of the risks, benefits and alternatives to treatment. Questions regarding the procedure were encouraged and answered. A timeout was performed prior to the initiation of the procedure. The right upper abdominal quadrant was prepped and draped in the usual sterile fashion, and a sterile drape was applied covering the operative field. Maximum barrier sterile technique with sterile gowns and gloves were used for the procedure. A timeout was performed prior to the initiation of the procedure. Ultrasound of the liver was  performed to delineate bile ducts in the right lobe of the liver. After the overlying soft tissues were anesthetized with 1% Lidocaine, a 21 gauge Chiba needle was utilized to opacify the peripheral aspect of an anterior right hepatic duct. Cholangiogram was performed. Guidewire was advanced. Additional percutaneous puncture was then performed under fluoroscopic guidance of a right intrahepatic bile duct with a 21 gauge needle. A guidewire was advanced through the needle. A transitional micropuncture dilator was then advanced. Over a guidewire, a 5 French catheter was advanced into the central bile ducts. This catheter was further advanced into the common bile duct and duodenum over a hydrophilic guidewire. Over an Amplatz stiff wire, the tract was dilated and a 10.2 [place] biliary drainage catheter was advanced with coil ultimately locked within the duodenum. Contrast was injected and a completion radiograph was obtained. The catheter was connected to a drainage bag which yielded the brisk return of clear bile. The catheter was secured to the skin with a Prolene retention suture and StatLock device. FINDINGS: After right lobe bile duct puncture and contrast injection, cholangiography shows a high-grade central biliary obstruction at the confluence of right lobe and left lobe ducts extending into central ducts. The left lobe ductal system was never opacified and may be completely obstructed by tumor. The level of obstruction was able to be crossed with a guidewire and catheter allowing placement of the internal/external biliary drain down to the level of the duodenum. The distal portion of the catheter was formed in the duodenum with sideholes extending up the common bile duct and into  the central right lobe of the liver. IMPRESSION: After right lobe ductal access, cholangiography demonstrates a high-grade central biliary obstruction extending into intrahepatic right lobe ducts. The left lobe ductal system was not  opacified and presumably completely obstructed by tumor. A 10 French internal/external biliary drainage catheter was able to be advanced through the level of obstruction and to the duodenum. This tube will be left to gravity bag drainage. Electronically Signed   By: Aletta Edouard M.D.   On: 07/12/2015 17:26    Labs:  CBC:  Recent Labs  07/11/15 0610 07/12/15 1016 07/13/15 0449 07/15/15 0601  WBC 8.9 9.2 9.8 10.2  HGB 9.3* 9.6* 10.1* 11.1*  HCT 27.3* 27.0* 28.7* 31.3*  PLT 399 377 371 429*    COAGS:  Recent Labs  12/31/14 0131 07/07/15 1848 07/12/15 0529  INR 1.18 1.11 1.28    BMP:  Recent Labs  07/12/15 0838 07/13/15 0450 07/14/15 0745 07/15/15 0601  NA 134* 136 134* 133*  K 5.2* 4.5 4.6 4.3  CL 105 102 100* 97*  CO2 15* 21* 23 23  GLUCOSE 165* 172* 197* 157*  BUN 11 7 12 14   CALCIUM 9.6 9.5 9.6 9.8  CREATININE 0.79 0.83 0.86 0.96  GFRNONAA >60 >60 >60 >60  GFRAA >60 >60 >60 >60    LIVER FUNCTION TESTS:  Recent Labs  07/12/15 0838 07/13/15 0450 07/14/15 0745 07/15/15 0601  BILITOT 17.3* 16.0* 15.7* 17.1*  AST 173* 151* 139* 166*  ALT 75* 69* 68* 73*  ALKPHOS 364* 312* 317* 338*  PROT 7.7 7.2 7.0 8.1  ALBUMIN 2.7* 2.4* 2.4* 2.6*    Assessment and Plan: Liver lesions Obstructive jaundice Int/Ext biliary drain placed 3/15 in IR -TB and other LFTs rising today, but drain still working well with good output, 1300cc/24hrs -cont current management.  Will follow drain and labs -will need a port at some point, inpatient vs outpatient.  Electronically Signed: Henreitta Cea 07/15/2015, 10:41 AM   I spent a total of 15 Minutes at the the patient's bedside AND on the patient's hospital floor or unit, greater than 50% of which was counseling/coordinating care for obstructive jaundice, s/p drain placement

## 2015-07-15 NOTE — Progress Notes (Signed)
Daily Progress Note   Patient Name: Ray Hall       Date: 07/15/2015 DOB: Nov 11, 1960  Age: 55 y.o. MRN#: [id number] Attending Physician: Lupita Dawn, MD Primary Care Physician: Tawanna Sat, MD Admit Date: 07/07/2015  Reason for Consultation/Follow-up: Non pain symptom management and Pain control  Subjective: Patient reports his pain has improved, and that he slept well last night. He does not look sedated, and states he does not feel sedated. He continues to verbalize 8 on a scale of 10 in terms of the maximum his pain has been in the past 24 hours but patient looks much better. He did use 3 by mouth when necessary doses of immediate release morphine. He is pleased that he is feeling better Interval Events: Improved pain control Length of Stay: 8 days  Current Medications: Scheduled Meds:  . aspirin EC  81 mg Oral Daily  . enoxaparin (LOVENOX) injection  40 mg Subcutaneous QHS  . feeding supplement (ENSURE ENLIVE)  237 mL Oral BID BM  . gabapentin  100 mg Oral BID PC  . gabapentin  300 mg Oral QHS  . Influenza vac split quadrivalent PF  0.5 mL Intramuscular Once  . insulin aspart  0-9 Units Subcutaneous TID WC  . lisinopril  40 mg Oral Daily  . morphine  15 mg Oral 3 times per day  . pneumococcal 23 valent vaccine  0.5 mL Intramuscular Once  . senna  2 tablet Oral QHS    Continuous Infusions:    PRN Meds: camphor-menthol, cholestyramine light, morphine, morphine injection, ondansetron **OR** ondansetron (ZOFRAN) IV  Physical Exam: Physical Exam  Constitutional: He appears well-nourished.  HENT:  Head: Normocephalic and atraumatic.  Eyes:  jaundiced  Pulmonary/Chest: Effort normal.  Musculoskeletal: Normal range of motion.  Neurological: He is alert.  Skin:  Skin is warm and dry.  Psychiatric: He has a normal mood and affect.                Vital Signs: BP 110/73 mmHg  Pulse 74  Temp(Src) 98.7 F (37.1 C) (Oral)  Resp 17  Ht 6\' 7"  (2.007 m)  Wt 125.193 kg (276 lb)  BMI 31.08 kg/m2  SpO2 99% SpO2: SpO2: 99 % O2 Device: O2 Device: Not Delivered O2 Flow Rate: O2 Flow Rate (L/min): 2 L/min  Intake/output summary:  Intake/Output Summary (Last 24 hours) at 07/15/15 1251 Last data filed at 07/15/15 1013  Gross per 24 hour  Intake    340 ml  Output   1750 ml  Net  -1410 ml   LBM: Last BM Date: 07/12/15 Baseline Weight: Weight: 121.11 kg (267 lb) Most recent weight: Weight: 125.193 kg (276 lb)       Palliative Assessment/Data: Flowsheet Rows        Most Recent Value   Intake Tab    Referral Department  Hospitalist   Unit at Time of Referral  Med/Surg Unit   Palliative Care Primary Diagnosis  Cancer   Date Notified  07/14/15   Palliative Care Type  New Palliative care   Reason for referral  Pain, Clarify Goals of Care   Date of Admission  07/07/15   Date first seen by Palliative Care  07/14/15   # of days Palliative referral response time  0 Day(s)   # of days IP prior to Palliative referral  7   Clinical Assessment    Palliative Performance Scale Score  40%   Pain Min Last 24 hours  2   Dyspnea Max Last 24 Hours  0   Dyspnea Min Last 24 hours  0   Nausea Max Last 24 Hours  0   Nausea Min Last 24 Hours  0   Anxiety Max Last 24 Hours  0   Anxiety Min Last 24 Hours  0   Other Max Last 24 Hours  0   Psychosocial & Spiritual Assessment    Palliative Care Outcomes    Patient/Family meeting held?  Yes   Who was at the meeting?  wife, mother, daughters   Palliative Care follow-up planned  Yes, Facility      Additional Data Reviewed: CBC    Component Value Date/Time   WBC 10.2 07/15/2015 0601   RBC 4.55 07/15/2015 0601   RBC 3.44* 03/01/2015 1100   HGB 11.1* 07/15/2015 0601   HCT 31.3* 07/15/2015 0601   PLT 429*  07/15/2015 0601   MCV 68.8* 07/15/2015 0601   MCH 24.4* 07/15/2015 0601   MCHC 35.5 07/15/2015 0601   RDW 19.3* 07/15/2015 0601   LYMPHSABS 3.2 12/31/2014 0131   MONOABS 0.9 12/31/2014 0131   EOSABS 0.1 12/31/2014 0131   BASOSABS 0.0 12/31/2014 0131    CMP     Component Value Date/Time   NA 133* 07/15/2015 0601   K 4.3 07/15/2015 0601   CL 97* 07/15/2015 0601   CO2 23 07/15/2015 0601   GLUCOSE 157* 07/15/2015 0601   BUN 14 07/15/2015 0601   CREATININE 0.96 07/15/2015 0601   CREATININE 0.79 12/23/2014 1503   CALCIUM 9.8 07/15/2015 0601   PROT 8.1 07/15/2015 0601   ALBUMIN 2.6* 07/15/2015 0601   AST 166* 07/15/2015 0601   ALT 73* 07/15/2015 0601   ALKPHOS 338* 07/15/2015 0601   BILITOT 17.1* 07/15/2015 0601   GFRNONAA >60 07/15/2015 0601   GFRNONAA >89 07/19/2011 1403   GFRAA >60 07/15/2015 0601   GFRAA >89 07/19/2011 1403       Problem List:  Patient Active Problem List   Diagnosis Date Noted  . 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 Palliative care encounter   . RUQ pain   . Liver metastases ([place])   . Pressure ulcer 07/08/2015  . Jaundice   . Liver mass   . Liver masses   . Metastatic cancer ([place])   . Hyperbilirubinemia 07/07/2015  .  Enuresis 05/21/2015  . Pain in joint, shoulder region 05/21/2015  . Sepsis secondary to UTI ([place]) 03/07/2015  . Protein-calorie malnutrition, severe 02/25/2015  . Neurogenic bladder 02/25/2015  . Left below knee amputation (BKA) 01/13/2015  . Hardware complicating wound infection ([place])   . Acid reflux 12/23/2014  . Microcytic anemia 12/23/2014  . Chest pain 12/17/2014  . Diabetic foot ulcer ([place]) 12/16/2014  . Atrial flutter ([place]) 12/16/2014  . Erectile dysfunction 11/06/2012  . BENIGN PROSTATIC HYPERTROPHY, WITH OBSTRUCTION 05/25/2010  . LOW BACK PAIN SYNDROME, SEVERE 04/16/2010  . Diabetes mellitus, type II ([place]) 06/26/2008  . Essential hypertension 06/24/2008     Palliative Care Assessment & Plan     1.Code Status:  Full code    Code Status Orders        Start     Ordered   07/07/15 2300  Full code   Continuous     07/07/15 2302    Code Status History    Date Active Date Inactive Code Status Order ID Comments User Context   02/24/2015  8:13 PM 03/03/2015  6:16 PM Full Code [id number]  Elberta Leatherwood, MD Inpatient   12/31/2014  3:51 PM 01/03/2015  7:07 PM Full Code [id number]  Newt Minion, MD Inpatient   12/29/2014 12:30 PM 12/31/2014  3:51 PM Full Code [id number]  Katheren Shams, DO Inpatient   12/16/2014  7:55 PM 12/20/2014  6:47 PM Full Code [id number]  Veatrice Bourbon, MD Inpatient   03/02/2014  8:26 PM 03/05/2014  6:56 PM Full Code [id number]  Lorna Few, DO Inpatient   08/27/2013  2:37 AM 08/30/2013  9:11 PM Full Code [id number]  Coral Spikes, DO Inpatient       2. Goals of Care/Additional Recommendations:  Patient is hopeful to pursue treatment for his newly diagnosed cholangiocarcinoma. Goals of care are in place for now  Limitations on Scope of Treatment: Full Scope Treatment  Desire for further Chaplaincy support:no  Psycho-social Needs: Grief/Bereavement Support  3. Symptom Management:      1. Pain: Continue morphine extended release 15 mg every 8 hours, 30 mg by mouth every 6 hours when necessary, and rescue IV dosing. Continue gabapentin 100 mg twice daily and 300 mg at bedtime      2. Constipation: Improving. Continue senna 2 tablets daily. Have changed the cholecystitis remained to when necessary usage only for itching      3. Pruritus; improving. Continue Sarna lotion  4. Palliative Prophylaxis:   Bowel Regimen and Frequent Pain Assessment  5. Prognosis: < 12 months  6. Discharge Planning:  Home with plans to pursue outpatient treatment for cholangiocarcinoma with Dr. Burney Gauze    Thank you for allowing the Palliative Medicine Team to assist in the care of this patient.   Time In: 1100 Time Out: 1125 Total Time 25 min Prolonged Time Billed  no          Dory Horn, NP  07/15/2015, 12:51 PM  Please contact Palliative Medicine Team phone at [phone] for questions and concerns.
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
## 2015-07-16 LAB — CBC
HCT: 27.8 % — ABNORMAL LOW (ref 39.0–52.0)
HEMOGLOBIN: 9.9 g/dL — AB (ref 13.0–17.0)
MCH: 24.1 pg — AB (ref 26.0–34.0)
MCHC: 35.6 g/dL (ref 30.0–36.0)
MCV: 67.6 fL — ABNORMAL LOW (ref 78.0–100.0)
Platelets: 348 10*3/uL (ref 150–400)
RBC: 4.11 MIL/uL — AB (ref 4.22–5.81)
RDW: 19.1 % — ABNORMAL HIGH (ref 11.5–15.5)
WBC: 9.9 10*3/uL (ref 4.0–10.5)

## 2015-07-16 LAB — COMPREHENSIVE METABOLIC PANEL
ALK PHOS: 296 U/L — AB (ref 38–126)
ALT: 65 U/L — ABNORMAL HIGH (ref 17–63)
ANION GAP: 14 (ref 5–15)
AST: 156 U/L — ABNORMAL HIGH (ref 15–41)
Albumin: 2.2 g/dL — ABNORMAL LOW (ref 3.5–5.0)
BUN: 19 mg/dL (ref 6–20)
CALCIUM: 9.5 mg/dL (ref 8.9–10.3)
CO2: 23 mmol/L (ref 22–32)
Chloride: 97 mmol/L — ABNORMAL LOW (ref 101–111)
Creatinine, Ser: 1.16 mg/dL (ref 0.61–1.24)
Glucose, Bld: 177 mg/dL — ABNORMAL HIGH (ref 65–99)
Potassium: 4.6 mmol/L (ref 3.5–5.1)
SODIUM: 134 mmol/L — AB (ref 135–145)
Total Bilirubin: 15.3 mg/dL — ABNORMAL HIGH (ref 0.3–1.2)
Total Protein: 6.6 g/dL (ref 6.5–8.1)

## 2015-07-16 LAB — GLUCOSE, CAPILLARY
GLUCOSE-CAPILLARY: 171 mg/dL — AB (ref 65–99)
GLUCOSE-CAPILLARY: 220 mg/dL — AB (ref 65–99)
GLUCOSE-CAPILLARY: 266 mg/dL — AB (ref 65–99)

## 2015-07-16 NOTE — Progress Notes (Signed)
Referring Physician(s): Dr. Dossie Arbour  Supervising Physician: Markus Daft  Chief Complaint: Internal/ext Biliary drain placed 07/12/15 Obstructive jaundice  Subjective:  Patient doing better today.  No longer c/o pain at the drain site.  Tells me he feels a little better today.  Allergies: Infed and Lactose intolerance (gi)  Medications: Prior to Admission medications   Medication Sig Start Date End Date Taking? Authorizing Provider  aspirin EC 81 MG tablet Take 1 tablet (81 mg total) by mouth daily. 01/03/15  Yes Alyssa A Haney, MD  diphenhydramine-acetaminophen (TYLENOL PM) 25-500 MG TABS Take 2 tablets by mouth at bedtime.   Yes Historical Provider, MD  ENSURE (ENSURE) Take 237 mLs by mouth 2 (two) times daily between meals.   Yes Historical Provider, MD  Ferrous Sulfate (IRON) 325 (65 FE) MG TABS Take 325 mg by mouth once. 03/03/15  Yes Asiyah Cletis Media, MD  gabapentin (NEURONTIN) 100 MG capsule TAKE ONE CAPSULE BY MOUTH THREE TIMES DAILY 05/24/15  Yes Leone Brand, MD  glipiZIDE (GLUCOTROL) 10 MG tablet TAKE ONE TABLET BY MOUTH TWICE DAILY BEFORE MEAL(S) 09/07/14  Yes Leone Brand, MD  lisinopril (PRINIVIL,ZESTRIL) 40 MG tablet TAKE ONE TABLET BY MOUTH ONCE DAILY 03/31/15  Yes Leone Brand, MD  metFORMIN (GLUCOPHAGE) 1000 MG tablet Take 1 tablet (1,000 mg total) by mouth 2 (two) times daily with a meal. 09/07/14  Yes Leone Brand, MD  naproxen sodium (ANAPROX) 220 MG tablet Take 440 mg by mouth daily as needed (general pain).   Yes Historical Provider, MD     Vital Signs: BP 133/88 mmHg  Pulse 82  Temp(Src) 98.1 F (36.7 C) (Oral)  Resp 18  Ht 6\' 7"  (2.007 m)  Wt 276 lb (125.193 kg)  BMI 31.08 kg/m2  SpO2 99%  Physical Exam  Awake and Alert RUQ drain in place, intact. Site looks good 750 ml of cloudy yellowish thick output   Imaging: Ct Abdomen Wo Contrast  07/13/2015  CLINICAL DATA:  Right upper quadrant pain. Biliary drain placed yesterday 05/01/2013.  History of diabetes, hypertension, sickle cell trait, neurogenic bladder. EXAM: CT ABDOMEN WITHOUT CONTRAST TECHNIQUE: Multidetector CT imaging of the abdomen was performed following the standard protocol without IV contrast. COMPARISON:  07/08/2015 FINDINGS: Lower chest: There is bibasilar atelectasis. Small right middle lobe pulmonary nodule is 4 mm on image 5 of series 3. A second right middle lobe pulmonary nodule is only partially imaged on image 1 of series 3 and measures at least 4 mm. Upper abdomen: Numerous low-attenuation lesions are again identified throughout the liver, better identified on previous contrast-enhanced exam. Largest, confluent mass in the left hepatic lobe is at least 10 cm in diameter. The liver is enlarged by the lesions, measuring 23 cm in craniocaudal length. Percutaneous right-sided biliary drain is in place. There is residual contrast within right lobe biliary ducts. Layering high attenuation material within the gallbladder, consistent with stones or sludge. Presence of the material in the gallbladder demonstrates a thickened gallbladder wall. No focal abnormality identified within the spleen or pancreas. There is bilateral hydronephrosis, left greater than right. There is residual contrast in the collecting system possibly related to urinary stasis. Adrenal glands are normal in appearance. Gastrointestinal tract: The stomach and small bowel loops are normal in appearance. The appendix is well seen and has a normal appearance. Visualized colonic loops are normal in appearance. Pelvis: Only partially imaged. There is a small amount of ascites in the right pericolic gutter. The  partially imaged urinary bladder contains contrast from recent CT exam. Retroperitoneum: Enlarged nodes are identified in the region of porta hepatis. No evidence for aortic aneurysm. Abdominal wall: Gynecomastia is present. Recannulized umbilical vein Osseous structures: Degenerative disc disease of the  thoracolumbar spine. No suspicious lytic or blastic lesions are identified. IMPRESSION: 1. Interval placement of biliary drain. 2. Thickened gallbladder wall. 3. Bilateral hydronephrosis, left greater than right. 4. Residual urinary tract contrast, likely related to urinary stasis. 5. Normal appendix. 6. Numerous hepatic lesions. Electronically Signed   By: Nolon Nations M.D.   On: 07/13/2015 09:59   Ir Int Lianne Cure Biliary Drain With Cholangiogram  07/12/2015  INDICATION: Large an multifocal hepatic mass is centered in the left lobe and central liver causing biliary obstruction. This presumably represents cholangiocarcinoma. Final results of a percutaneous biopsy performed on 07/10/2015 are pending. The patient requires percutaneous biliary drainage. EXAM: PERCUTANEOUS INTERNAL/EXTERNAL BILIARY DRAINAGE CATHETER PLACEMENT WITH CHOLANGIOGRAM COMPARISON:  MRI of the abdomen on 07/11/2015 and CT of the abdomen on 07/08/2015. MEDICATIONS: 3.375 g IV Zosyn CONTRAST:  30mL OMNIPAQUE IOHEXOL 300 MG/ML  SOLN ANESTHESIA/SEDATION: 8.0 mg IV Versed, 200 mcg IV fentanyl Total Moderate Sedation Time 75 minutes. The patient's level of consciousness and physiologic status were continuously monitored during the procedure by Radiology nursing. FLUOROSCOPY TIME:  11 minutes. COMPLICATIONS: None TECHNIQUE: Informed written consent was obtained from [place] after a discussion of the risks, benefits and alternatives to treatment. Questions regarding the procedure were encouraged and answered. A timeout was performed prior to the initiation of the procedure. The right upper abdominal quadrant was prepped and draped in the usual sterile fashion, and a sterile drape was applied covering the operative field. Maximum barrier sterile technique with sterile gowns and gloves were used for the procedure. A timeout was performed prior to the initiation of the procedure. Ultrasound of the liver was performed to delineate bile ducts in  the right lobe of the liver. After the overlying soft tissues were anesthetized with 1% Lidocaine, a 21 gauge Chiba needle was utilized to opacify the peripheral aspect of an anterior right hepatic duct. Cholangiogram was performed. Guidewire was advanced. Additional percutaneous puncture was then performed under fluoroscopic guidance of a right intrahepatic bile duct with a 21 gauge needle. A guidewire was advanced through the needle. A transitional micropuncture dilator was then advanced. Over a guidewire, a 5 French catheter was advanced into the central bile ducts. This catheter was further advanced into the common bile duct and duodenum over a hydrophilic guidewire. Over an Amplatz stiff wire, the tract was dilated and a 10.2 [place] biliary drainage catheter was advanced with coil ultimately locked within the duodenum. Contrast was injected and a completion radiograph was obtained. The catheter was connected to a drainage bag which yielded the brisk return of clear bile. The catheter was secured to the skin with a Prolene retention suture and StatLock device. FINDINGS: After right lobe bile duct puncture and contrast injection, cholangiography shows a high-grade central biliary obstruction at the confluence of right lobe and left lobe ducts extending into central ducts. The left lobe ductal system was never opacified and may be completely obstructed by tumor. The level of obstruction was able to be crossed with a guidewire and catheter allowing placement of the internal/external biliary drain down to the level of the duodenum. The distal portion of the catheter was formed in the duodenum with sideholes extending up the common bile duct and into the central right lobe of the  liver. IMPRESSION: After right lobe ductal access, cholangiography demonstrates a high-grade central biliary obstruction extending into intrahepatic right lobe ducts. The left lobe ductal system was not opacified and presumably completely  obstructed by tumor. A 10 French internal/external biliary drainage catheter was able to be advanced through the level of obstruction and to the duodenum. This tube will be left to gravity bag drainage. Electronically Signed   By: Aletta Edouard M.D.   On: 07/12/2015 17:26    Labs:  CBC:  Recent Labs  07/12/15 1016 07/13/15 0449 07/15/15 0601 07/16/15 0558  WBC 9.2 9.8 10.2 9.9  HGB 9.6* 10.1* 11.1* 9.9*  HCT 27.0* 28.7* 31.3* 27.8*  PLT 377 371 429* 348    COAGS:  Recent Labs  12/31/14 0131 07/07/15 1848 07/12/15 0529  INR 1.18 1.11 1.28    BMP:  Recent Labs  07/14/15 0745 07/15/15 0601 07/15/15 1254 07/16/15 0558  NA 134* 133* 134* 134*  K 4.6 4.3 4.9 4.6  CL 100* 97* 98* 97*  CO2 23 23 23 23   GLUCOSE 197* 157* 208* 177*  BUN 12 14 17 19   CALCIUM 9.6 9.8 9.4 9.5  CREATININE 0.86 0.96 1.18 1.16  GFRNONAA >60 >60 >60 >60  GFRAA >60 >60 >60 >60    LIVER FUNCTION TESTS:  Recent Labs  07/14/15 0745 07/15/15 0601 07/15/15 1254 07/16/15 0558  BILITOT 15.7* 17.1* 15.2* 15.3*  AST 139* 166* 152* 156*  ALT 68* 73* 64* 65*  ALKPHOS 317* 338* 297* 296*  PROT 7.0 8.1 7.2 6.6  ALBUMIN 2.4* 2.6* 2.3* 2.2*    Assessment and Plan:  Liver lesions Obstructive jaundice = Bili 15.3  Int/Ext biliary drain placed 3/15 by Dr. Kathlene Cote.  Cont current care Will follow drain and labs May need a port at some point, inpatient vs outpatient  Electronically Signed: Murrell Redden 07/16/2015, 11:06 AM   I spent a total of 15 Minutes at the the patient's bedside AND on the patient's hospital floor or unit, greater than 50% of which was counseling/coordinating care for f/u biliary drain.
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
## 2015-07-17 DIAGNOSIS — K831 Obstruction of bile duct: Secondary | ICD-10-CM | POA: Insufficient documentation

## 2015-07-17 LAB — COMPREHENSIVE METABOLIC PANEL
ALT: 72 U/L — AB (ref 17–63)
AST: 199 U/L — AB (ref 15–41)
Albumin: 2.2 g/dL — ABNORMAL LOW (ref 3.5–5.0)
Alkaline Phosphatase: 299 U/L — ABNORMAL HIGH (ref 38–126)
Anion gap: 12 (ref 5–15)
BUN: 27 mg/dL — ABNORMAL HIGH (ref 6–20)
CHLORIDE: 96 mmol/L — AB (ref 101–111)
CO2: 25 mmol/L (ref 22–32)
CREATININE: 1.38 mg/dL — AB (ref 0.61–1.24)
Calcium: 9.2 mg/dL (ref 8.9–10.3)
GFR calc non Af Amer: 57 mL/min — ABNORMAL LOW (ref >60)
Glucose, Bld: 183 mg/dL — ABNORMAL HIGH (ref 65–99)
Potassium: 4.6 mmol/L (ref 3.5–5.1)
SODIUM: 133 mmol/L — AB (ref 135–145)
Total Bilirubin: 16.3 mg/dL — ABNORMAL HIGH (ref 0.3–1.2)
Total Protein: 7.1 g/dL (ref 6.5–8.1)

## 2015-07-17 LAB — CBC
HEMATOCRIT: 29 % — AB (ref 39.0–52.0)
Hemoglobin: 10.4 g/dL — ABNORMAL LOW (ref 13.0–17.0)
MCH: 24.5 pg — AB (ref 26.0–34.0)
MCHC: 35.9 g/dL (ref 30.0–36.0)
MCV: 68.4 fL — AB (ref 78.0–100.0)
PLATELETS: 424 10*3/uL — AB (ref 150–400)
RBC: 4.24 MIL/uL (ref 4.22–5.81)
RDW: 19.4 % — ABNORMAL HIGH (ref 11.5–15.5)
WBC: 10.8 10*3/uL — ABNORMAL HIGH (ref 4.0–10.5)

## 2015-07-17 LAB — GLUCOSE, CAPILLARY
GLUCOSE-CAPILLARY: 213 mg/dL — AB (ref 65–99)
Glucose-Capillary: 170 mg/dL — ABNORMAL HIGH (ref 65–99)
Glucose-Capillary: 194 mg/dL — ABNORMAL HIGH (ref 65–99)
Glucose-Capillary: 220 mg/dL — ABNORMAL HIGH (ref 65–99)

## 2015-07-17 MED ORDER — GABAPENTIN 100 MG PO CAPS
ORAL_CAPSULE | ORAL | Status: DC
Start: 2015-07-17 — End: 2015-07-24

## 2015-07-17 MED ORDER — CHOLESTYRAMINE LIGHT 4 G PO PACK: 4.0000 g | PACK | Freq: Three times a day (TID) | ORAL | Status: DC | PRN

## 2015-07-17 MED ORDER — SENNA 8.6 MG PO TABS: 2.0000 | ORAL_TABLET | Freq: Every day | ORAL | Status: DC

## 2015-07-17 MED ORDER — MORPHINE SULFATE 30 MG PO TABS: 30.0000 mg | ORAL_TABLET | Freq: Four times a day (QID) | ORAL | Status: DC | PRN

## 2015-07-17 MED ORDER — MORPHINE SULFATE ER 15 MG PO TBCR: 15.0000 mg | EXTENDED_RELEASE_TABLET | Freq: Three times a day (TID) | ORAL | Status: DC

## 2015-07-17 MED ORDER — CAMPHOR-MENTHOL 0.5-0.5 % EX LOTN: TOPICAL_LOTION | CUTANEOUS | Status: AC | PRN

## 2015-07-17 MED ORDER — ONDANSETRON HCL 8 MG PO TABS: 8.0000 mg | ORAL_TABLET | Freq: Four times a day (QID) | ORAL | Status: AC | PRN

## 2015-07-17 MED ORDER — ENSURE ENLIVE PO LIQD: 237.0000 mL | Freq: Two times a day (BID) | ORAL | Status: AC

## 2015-07-17 NOTE — Progress Notes (Addendum)
Patient ID: Ray Hall, male   DOB: [birth year], 55 y.o.   MRN: [id number]   Would like to wait til T Bili coming down for [facility]  Per MD notes  Plan for outpatient [facility] Call into Dr Marin Olp   ADDENDUM: Talked with Dr Marin Olp Would like PAC while Inpt if can He does understand Bili issue.  IR will keep on Radar and watch TBili Will make plan later in week
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
## 2015-07-17 NOTE — Progress Notes (Signed)
Inpatient Diabetes Program Recommendations  AACE/ADA: New Consensus Statement on Inpatient Glycemic Control (2015)  Target Ranges:  Prepandial:   less than 140 mg/dL      Peak postprandial:   less than 180 mg/dL (1-2 hours)      Critically ill patients:  140 - 180 mg/dL   Results for PORTER, BUSTILLOS (MRN [id number]) as of 07/17/2015 15:20  Ref. Range 07/16/2015 17:37 07/16/2015 22:10 07/17/2015 05:51 07/17/2015 07:48 07/17/2015 11:34  Glucose-Capillary Latest Ref Range: 65-99 mg/dL 266 (H) 170 (H)  194 (H) 220 (H)    07/17/15 Please consider adding CHO modified to the current diet order.  [place], CDE. M.Ed. Pager [phone] Inpatient Diabetes Coordinator
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
## 2015-07-17 NOTE — Discharge Instructions (Signed)
Ray Hall, Pessin were admitted for abdominal pain and scleral icterus (yellowing of the whites of your eyes), concerning for liver disease. On imaging, multiple liver masses were seen. Liver biopsy confirmed cancer. A drain in your gallbladder was placed to help decrease your bilirubin level.  Please continue taking MS contin 15 mg every 8 hours for pain. You also have a prescription for instant release morphine 30 mg to take every 6 hours if needed. Please also continue gabapentin. While on pain medications, please continue the medication senna to help you have bowel movements.  An appointment to check on your gallbladder drain was made for Thursday, 07/27/2015, at 10:30 a.m. Please do not eat 4 hours before this appointment, as you will need to get a CT. You may drink liquids.   Oncologist Dr. Marin Olp will call you with a follow-up appointment with him. He will help arrange when to get the port-a-cath placed with interventional radiology when your liver enzymes improve.   We will recheck your bilirubin at your follow-up with your PCP Dr. Lamar Benes.  Please come to the Emergency Room if you have any confusion, increased pain, fevers, or inability to eat and drink.

## 2015-07-18 ENCOUNTER — Other Ambulatory Visit: Payer: Self-pay | Admitting: Gastroenterology

## 2015-07-18 ENCOUNTER — Ambulatory Visit: Payer: Medicaid Other | Admitting: Physical Therapy

## 2015-07-18 DIAGNOSIS — K831 Obstruction of bile duct: Secondary | ICD-10-CM

## 2015-07-21 ENCOUNTER — Other Ambulatory Visit: Payer: Self-pay | Admitting: Hematology & Oncology

## 2015-07-24 ENCOUNTER — Other Ambulatory Visit (HOSPITAL_BASED_OUTPATIENT_CLINIC_OR_DEPARTMENT_OTHER): Payer: Medicaid Other

## 2015-07-24 ENCOUNTER — Ambulatory Visit (HOSPITAL_BASED_OUTPATIENT_CLINIC_OR_DEPARTMENT_OTHER): Payer: Medicaid Other | Admitting: Hematology & Oncology

## 2015-07-24 ENCOUNTER — Telehealth: Payer: Self-pay | Admitting: *Deleted

## 2015-07-24 ENCOUNTER — Encounter: Payer: Self-pay | Admitting: Hematology & Oncology

## 2015-07-24 VITALS — BP 100/38 | HR 40 | Temp 98.4°F | Resp 14 | Ht 79.0 in | Wt 276.0 lb

## 2015-07-24 DIAGNOSIS — C221 Intrahepatic bile duct carcinoma: Secondary | ICD-10-CM

## 2015-07-24 DIAGNOSIS — C787 Secondary malignant neoplasm of liver and intrahepatic bile duct: Secondary | ICD-10-CM

## 2015-07-24 DIAGNOSIS — E1101 Type 2 diabetes mellitus with hyperosmolarity with coma: Secondary | ICD-10-CM

## 2015-07-24 LAB — COMPREHENSIVE METABOLIC PANEL
ALT: 112 U/L — AB (ref 0–55)
Albumin: 2 g/dL — ABNORMAL LOW (ref 3.5–5.0)
Alkaline Phosphatase: 198 U/L — ABNORMAL HIGH (ref 40–150)
Anion Gap: 18 mEq/L — ABNORMAL HIGH (ref 3–11)
BUN: 83.2 mg/dL — AB (ref 7.0–26.0)
CALCIUM: 9.3 mg/dL (ref 8.4–10.4)
CHLORIDE: 93 meq/L — AB (ref 98–109)
CO2: 17 meq/L — AB (ref 22–29)
Creatinine: 3 mg/dL (ref 0.7–1.3)
EGFR: 26 mL/min/{1.73_m2} — ABNORMAL LOW (ref >90)
Glucose: 22 mg/dl — CL (ref 70–140)
POTASSIUM: 5.2 meq/L — AB (ref 3.5–5.1)
Sodium: 128 mEq/L — ABNORMAL LOW (ref 136–145)
Total Bilirubin: 20.32 mg/dL (ref 0.20–1.20)
Total Protein: 7.5 g/dL (ref 6.4–8.3)

## 2015-07-24 LAB — TECHNOLOGIST REVIEW CHCC SATELLITE

## 2015-07-24 LAB — CBC WITH DIFFERENTIAL (CANCER CENTER ONLY)
BASO#: 0 10*3/uL (ref 0.0–0.2)
BASO%: 0.1 % (ref 0.0–2.0)
EOS ABS: 0.1 10*3/uL (ref 0.0–0.5)
EOS%: 0.4 % (ref 0.0–7.0)
HCT: 23.7 % — ABNORMAL LOW (ref 38.7–49.9)
HEMOGLOBIN: 9 g/dL — AB (ref 13.0–17.1)
LYMPH#: 1.2 10*3/uL (ref 0.9–3.3)
LYMPH%: 8.3 % — AB (ref 14.0–48.0)
MCH: 24.8 pg — AB (ref 28.0–33.4)
MCHC: 38 g/dL — ABNORMAL HIGH (ref 32.0–35.9)
MCV: 65 fL — ABNORMAL LOW (ref 82–98)
MONO#: 2 10*3/uL — ABNORMAL HIGH (ref 0.1–0.9)
MONO%: 14 % — AB (ref 0.0–13.0)
NEUT%: 77.2 % (ref 40.0–80.0)
NEUTROS ABS: 11.2 10*3/uL — AB (ref 1.5–6.5)
PLATELETS: 653 10*3/uL — AB (ref 145–400)
RBC: 3.63 10*6/uL — AB (ref 4.20–5.70)
RDW: 17.1 % — ABNORMAL HIGH (ref 11.1–15.7)
WBC: 14.5 10*3/uL — AB (ref 4.0–10.0)

## 2015-07-24 LAB — LACTATE DEHYDROGENASE: LDH: 277 U/L — AB (ref 125–245)

## 2015-07-24 MED ORDER — GABAPENTIN 100 MG PO CAPS: ORAL_CAPSULE | ORAL | Status: AC

## 2015-07-24 MED ORDER — GABAPENTIN 100 MG PO CAPS: ORAL_CAPSULE | ORAL | Status: DC

## 2015-07-24 NOTE — Progress Notes (Signed)
Hematology and Oncology Follow Up Visit  Ray Hall [id number] [birth year] 55 y.o. 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 We first saw him at [facility]. He was admitted with jaundice. His bilirubin was about 19.   He did have a liver biopsy done. This was done on March 13. The pathology report [id number]) showed metastatic adenocarcinoma which was consistent with a pancreatic biliary primary. I felt this was most consistent with a cholangiocarcinoma. His alpha-fetoprotein was quite high which was a little bit unusual. It was 1472. His CA-19-9 was mildly elevated at 285. CEA was 5.4.  He has non-insulin-dependent diabetes. He's had diabetes for 11 years. He has a left BKA.  He did have a drainage catheter placed into the liver. This, unfortunately, has not improved his bilirubin elevation. He still has marked ocular jaundice.  He is on MS Contin and MSIR. I tried to get his family to understand how to take these medications. He might be overmedicating.  He seems to be eating marginally. His heart is safe he has any dehydration.  He's had no bleeding. He's had no fever.  He's had no cough or shortness of breath.  He comes in a wheelchair. Overall, his performance status is ECOG 2-3.  I did send his MRI on a disc to [facility]. I spoke to one oncologist out there. Sometimes surgery could be done to try to relieve the biliary obstruction.   Medications:  Current outpatient prescriptions:  .  aspirin EC 81 MG tablet, Take 1 tablet (81 mg total) by mouth daily., Disp: 30 tablet, Rfl: 5 .  camphor-menthol (SARNA) lotion, Apply topically as needed for itching., Disp: 222 mL, Rfl: 0 .  ENSURE (ENSURE), Take 237 mLs by mouth 2 (two) times daily between meals., Disp: , Rfl:  .  feeding supplement, ENSURE  ENLIVE, (ENSURE ENLIVE) LIQD, Take 237 mLs by mouth 2 (two) times daily between meals., Disp: 237 mL, Rfl: 12 .  gabapentin (NEURONTIN) 100 MG capsule, Please take 100 mg in the morning and mid-day and 300 mg at bedtime., Disp: 270 capsule, Rfl: 0 .  glipiZIDE (GLUCOTROL) 10 MG tablet, TAKE ONE TABLET BY MOUTH TWICE DAILY BEFORE MEAL(S), Disp: 180 tablet, Rfl: 3 .  lisinopril (PRINIVIL,ZESTRIL) 40 MG tablet, TAKE ONE TABLET BY MOUTH ONCE DAILY, Disp: 90 tablet, Rfl: 2 .  metFORMIN (GLUCOPHAGE) 1000 MG tablet, Take 1 tablet (1,000 mg total) by mouth 2 (two) times daily with a meal., Disp: 180 tablet, Rfl: 3 .  morphine (MS CONTIN) 15 MG 12 hr tablet, Take 1 tablet (15 mg total) by mouth every 8 (eight) hours., Disp: 45 tablet, Rfl: 0 .  morphine (MSIR) 30 MG tablet, Take 1 tablet (30 mg total) by mouth every 6 (six) hours as needed for moderate pain., Disp: 60 tablet, Rfl: 0 .  naproxen sodium (ANAPROX) 220 MG tablet, Take 440 mg by mouth daily as needed (general pain)., Disp: , Rfl:  .  ondansetron (ZOFRAN) 8 MG tablet, Take 1 tablet (8 mg total) by mouth every 6 (six) hours as needed for nausea., Disp: 20 tablet, Rfl: 0 .  senna (SENOKOT) 8.6 MG TABS tablet, Take 2 tablets (17.2 mg total) by mouth at bedtime., Disp: 120 each, Rfl: 0  Allergies:  Allergies  Allergen Reactions  . Ardyth Harps Antonieta Pert Dextran] Anaphylaxis  Rapid response called - trouble breathing and hypotensive. Treated with 1 Epi, SoluMedrol, and Benadryl.   . Lactose Intolerance (Gi) Other (See Comments)    Gas & heartburn    Past Medical History, Surgical history, Social history, and Family History were reviewed and updated.  Review of SysteAs above Physical Exam:  height is 6\' 7"  (2.007 m) and weight is 276 lb (125.193 kg). His oral temperature is 98.4 F (36.9 C). His blood pressure is 100/38 and his pulse is 40. His respiration is 14.   Wt Readings from Last 3 Encounters:  07/24/15 276 lb (125.193 kg)  07/10/15 276 lb  (125.193 kg)  05/18/15 290 lb (131.543 kg)  '  Jaundiced African-American male in no obvious distress. He is slightly lethargic. Head and neck exam shows scleral icterus. He has palatal icterus. There is no adenopathy in the neck. Lungs are slightly decreased at the bases. Cardiac exam regular rate and rhythm with no murmurs, rubs or bruits. Abdomen is obese but soft. He has decent bowel sounds. There is no fluid wave. There is no palpable liver or spleen tip. Back exam shows no tenderness over the spine, ribs or hips. Extremities shows no clubbing, cyanosis or edema. He has a left BKA. Neurological exam is nonfocal. Skin exam shows not show any skin excoriations. He has no ecchymoses or petechia   Lab Results  Component Value Date   WBC 14.5* 07/24/2015   HGB 9.0* 07/24/2015   HCT 23.7* 07/24/2015   MCV 65* 07/24/2015   PLT 653* 07/24/2015     Chemistry      Component Value Date/Time   NA 128* 07/24/2015 1201   NA 133* 07/17/2015 0551   K 5.2* 07/24/2015 1201   K 4.6 07/17/2015 0551   CL 96* 07/17/2015 0551   CO2 17* 07/24/2015 1201   CO2 25 07/17/2015 0551   BUN 83.2* 07/24/2015 1201   BUN 27* 07/17/2015 0551   CREATININE 3.0* 07/24/2015 1201   CREATININE 1.38* 07/17/2015 0551   CREATININE 0.79 12/23/2014 1503      Component Value Date/Time   CALCIUM 9.3 07/24/2015 1201   CALCIUM 9.2 07/17/2015 0551   ALKPHOS 198* 07/24/2015 1201   ALKPHOS 299* 07/17/2015 0551   AST 180 Repeated and Verified* 07/24/2015 1201   AST 199* 07/17/2015 0551   ALT 112* 07/24/2015 1201   ALT 72* 07/17/2015 0551   BILITOT 20.32 Result Confirmed by Automated Dilution.* 07/24/2015 1201   BILITOT 16.3* 07/17/2015 0551         Impression and Plan: Ray Hall is a 55 year old African-American male. Unfortunately, he really has a very bad problem. I'm just not sure how we will be able to treat him. His bilirubin is still quite elevated at 20. Now he has some renal insufficiency. I suspect that he  may have to end up when back to the hospital. He has some degree of dehydration.  I spent about an hour with he and his family. There are all very nice. I explained to them the problem that we have. With his levels as they are with his bilirubin, there just is no way that we can treat him., Sure even if he went to [facility] and has some type of procedure to unblock his bilirubin, he would be able to tolerate any therapy.  I wanted try to make sure that he is comfortable. I wanted try to make sure that he has some quality of life. I just worry that are not going  to be able to treat him.    hopefully, he will be able to a little bit more.  I will plan to have him come back in a couple weeks. Somehow, I think he will probably end up in the hospital a lot sooner.  It is clearly likely and probable that he never will be a would have treatment despite our best efforts. Again, I want to make sure that he has some quality of life.     Volanda Napoleon, MD 3/27/20175:05 PM

## 2015-07-24 NOTE — Telephone Encounter (Signed)
Critical Values   Glucose 22   Creatinine 3.0   Total Bilirubin 20.32   AST 180  Dr Marin Olp notified. No orders at this time.

## 2015-07-25 ENCOUNTER — Inpatient Hospital Stay: Payer: Self-pay | Admitting: Family Medicine

## 2015-07-25 ENCOUNTER — Other Ambulatory Visit: Payer: Self-pay

## 2015-07-25 ENCOUNTER — Inpatient Hospital Stay (HOSPITAL_COMMUNITY)
Admission: EM | Admit: 2015-07-25 | Discharge: 2015-07-28 | DRG: 637 | Disposition: A | Discharge status: Hospice | Payer: Self-pay | Attending: Family Medicine | Admitting: Family Medicine

## 2015-07-25 ENCOUNTER — Encounter (HOSPITAL_COMMUNITY): Payer: Self-pay | Admitting: Emergency Medicine

## 2015-07-25 DIAGNOSIS — K219 Gastro-esophageal reflux disease without esophagitis: Secondary | ICD-10-CM | POA: Diagnosis present

## 2015-07-25 DIAGNOSIS — T50995A Adverse effect of other drugs, medicaments and biological substances, initial encounter: Secondary | ICD-10-CM | POA: Diagnosis present

## 2015-07-25 DIAGNOSIS — Z7189 Other specified counseling: Secondary | ICD-10-CM | POA: Insufficient documentation

## 2015-07-25 DIAGNOSIS — E162 Hypoglycemia, unspecified: Secondary | ICD-10-CM | POA: Diagnosis present

## 2015-07-25 DIAGNOSIS — I10 Essential (primary) hypertension: Secondary | ICD-10-CM | POA: Diagnosis present

## 2015-07-25 DIAGNOSIS — Z7982 Long term (current) use of aspirin: Secondary | ICD-10-CM

## 2015-07-25 DIAGNOSIS — E860 Dehydration: Secondary | ICD-10-CM | POA: Diagnosis present

## 2015-07-25 DIAGNOSIS — Z66 Do not resuscitate: Secondary | ICD-10-CM | POA: Diagnosis present

## 2015-07-25 DIAGNOSIS — K767 Hepatorenal syndrome: Secondary | ICD-10-CM | POA: Diagnosis present

## 2015-07-25 DIAGNOSIS — E875 Hyperkalemia: Secondary | ICD-10-CM | POA: Diagnosis present

## 2015-07-25 DIAGNOSIS — N19 Unspecified kidney failure: Secondary | ICD-10-CM | POA: Diagnosis present

## 2015-07-25 DIAGNOSIS — E871 Hypo-osmolality and hyponatremia: Secondary | ICD-10-CM | POA: Diagnosis present

## 2015-07-25 DIAGNOSIS — E739 Lactose intolerance, unspecified: Secondary | ICD-10-CM | POA: Diagnosis present

## 2015-07-25 DIAGNOSIS — N319 Neuromuscular dysfunction of bladder, unspecified: Secondary | ICD-10-CM | POA: Diagnosis present

## 2015-07-25 DIAGNOSIS — C787 Secondary malignant neoplasm of liver and intrahepatic bile duct: Secondary | ICD-10-CM

## 2015-07-25 DIAGNOSIS — C801 Malignant (primary) neoplasm, unspecified: Secondary | ICD-10-CM

## 2015-07-25 DIAGNOSIS — Z515 Encounter for palliative care: Secondary | ICD-10-CM | POA: Insufficient documentation

## 2015-07-25 DIAGNOSIS — I499 Cardiac arrhythmia, unspecified: Secondary | ICD-10-CM | POA: Diagnosis present

## 2015-07-25 DIAGNOSIS — Z87891 Personal history of nicotine dependence: Secondary | ICD-10-CM

## 2015-07-25 DIAGNOSIS — Z888 Allergy status to other drugs, medicaments and biological substances status: Secondary | ICD-10-CM

## 2015-07-25 DIAGNOSIS — Z7984 Long term (current) use of oral hypoglycemic drugs: Secondary | ICD-10-CM

## 2015-07-25 DIAGNOSIS — N1330 Unspecified hydronephrosis: Secondary | ICD-10-CM | POA: Diagnosis present

## 2015-07-25 DIAGNOSIS — E11649 Type 2 diabetes mellitus with hypoglycemia without coma: Principal | ICD-10-CM | POA: Diagnosis present

## 2015-07-25 DIAGNOSIS — Z89512 Acquired absence of left leg below knee: Secondary | ICD-10-CM

## 2015-07-25 DIAGNOSIS — D509 Iron deficiency anemia, unspecified: Secondary | ICD-10-CM | POA: Diagnosis present

## 2015-07-25 DIAGNOSIS — E872 Acidosis: Secondary | ICD-10-CM | POA: Diagnosis present

## 2015-07-25 DIAGNOSIS — N179 Acute kidney failure, unspecified: Secondary | ICD-10-CM | POA: Insufficient documentation

## 2015-07-25 DIAGNOSIS — I959 Hypotension, unspecified: Secondary | ICD-10-CM | POA: Diagnosis present

## 2015-07-25 DIAGNOSIS — Z79899 Other long term (current) drug therapy: Secondary | ICD-10-CM

## 2015-07-25 DIAGNOSIS — D72829 Elevated white blood cell count, unspecified: Secondary | ICD-10-CM | POA: Diagnosis present

## 2015-07-25 DIAGNOSIS — Z87440 Personal history of urinary (tract) infections: Secondary | ICD-10-CM

## 2015-07-25 DIAGNOSIS — D573 Sickle-cell trait: Secondary | ICD-10-CM | POA: Diagnosis present

## 2015-07-25 DIAGNOSIS — D638 Anemia in other chronic diseases classified elsewhere: Secondary | ICD-10-CM | POA: Diagnosis present

## 2015-07-25 DIAGNOSIS — K7290 Hepatic failure, unspecified without coma: Secondary | ICD-10-CM | POA: Diagnosis present

## 2015-07-25 DIAGNOSIS — K831 Obstruction of bile duct: Secondary | ICD-10-CM

## 2015-07-25 HISTORY — DX: Malignant (primary) neoplasm, unspecified: C80.1

## 2015-07-25 LAB — BASIC METABOLIC PANEL
Anion gap: 19 — ABNORMAL HIGH (ref 5–15)
BUN: 102 mg/dL — AB (ref 6–20)
CALCIUM: 8.4 mg/dL — AB (ref 8.9–10.3)
CO2: 19 mmol/L — ABNORMAL LOW (ref 22–32)
CREATININE: 3.78 mg/dL — AB (ref 0.61–1.24)
Chloride: 94 mmol/L — ABNORMAL LOW (ref 101–111)
GFR calc Af Amer: 20 mL/min — ABNORMAL LOW (ref >60)
GFR, EST NON AFRICAN AMERICAN: 17 mL/min — AB (ref >60)
GLUCOSE: 26 mg/dL — AB (ref 65–99)
Potassium: 5.7 mmol/L — ABNORMAL HIGH (ref 3.5–5.1)
Sodium: 132 mmol/L — ABNORMAL LOW (ref 135–145)

## 2015-07-25 LAB — URINE MICROSCOPIC-ADD ON

## 2015-07-25 LAB — CBG MONITORING, ED
GLUCOSE-CAPILLARY: 16 mg/dL — AB (ref 65–99)
GLUCOSE-CAPILLARY: 98 mg/dL (ref 65–99)
GLUCOSE-CAPILLARY: 120 mg/dL — AB (ref 65–99)
Glucose-Capillary: 13 mg/dL — CL (ref 65–99)
Glucose-Capillary: 55 mg/dL — ABNORMAL LOW (ref 65–99)
Glucose-Capillary: 128 mg/dL — ABNORMAL HIGH (ref 65–99)
Glucose-Capillary: 114 mg/dL — ABNORMAL HIGH (ref 65–99)
Glucose-Capillary: 108 mg/dL — ABNORMAL HIGH (ref 65–99)
Glucose-Capillary: 99 mg/dL (ref 65–99)
Glucose-Capillary: 106 mg/dL — ABNORMAL HIGH (ref 65–99)
Glucose-Capillary: 82 mg/dL (ref 65–99)
Glucose-Capillary: 61 mg/dL — ABNORMAL LOW (ref 65–99)

## 2015-07-25 LAB — CBC
HCT: 23.4 % — ABNORMAL LOW (ref 39.0–52.0)
Hemoglobin: 8.4 g/dL — ABNORMAL LOW (ref 13.0–17.0)
MCH: 23.4 pg — ABNORMAL LOW (ref 26.0–34.0)
MCHC: 35.9 g/dL (ref 30.0–36.0)
MCV: 65.2 fL — ABNORMAL LOW (ref 78.0–100.0)
Platelets: 619 10*3/uL — ABNORMAL HIGH (ref 150–400)
RBC: 3.59 MIL/uL — ABNORMAL LOW (ref 4.22–5.81)
RDW: 18.3 % — AB (ref 11.5–15.5)
WBC: 16.8 10*3/uL — ABNORMAL HIGH (ref 4.0–10.5)

## 2015-07-25 LAB — URINALYSIS, ROUTINE W REFLEX MICROSCOPIC
Glucose, UA: NEGATIVE mg/dL
KETONES UR: NEGATIVE mg/dL
NITRITE: NEGATIVE
PH: 5 (ref 5.0–8.0)
Protein, ur: NEGATIVE mg/dL
Specific Gravity, Urine: 1.015 (ref 1.005–1.030)

## 2015-07-25 LAB — PREALBUMIN: PREALBUMIN: 13 mg/dL (ref 10–36)

## 2015-07-25 LAB — APTT: APTT: 36 s (ref 24–37)

## 2015-07-25 LAB — HEPATIC FUNCTION PANEL
ALBUMIN: 2.1 g/dL — AB (ref 3.5–5.0)
ALT: 144 U/L — AB (ref 17–63)
AST: 304 U/L — AB (ref 15–41)
Alkaline Phosphatase: 166 U/L — ABNORMAL HIGH (ref 38–126)
BILIRUBIN DIRECT: 13 mg/dL — AB (ref 0.1–0.5)
BILIRUBIN TOTAL: 20.1 mg/dL — AB (ref 0.3–1.2)
Indirect Bilirubin: 7.1 mg/dL — ABNORMAL HIGH (ref 0.3–0.9)
Total Protein: 7.3 g/dL (ref 6.5–8.1)

## 2015-07-25 LAB — PROTIME-INR
INR: 1.5 — AB (ref 0.00–1.49)
Prothrombin Time: 18.2 seconds — ABNORMAL HIGH (ref 11.6–15.2)

## 2015-07-25 LAB — AMMONIA: Ammonia: 77 umol/L — ABNORMAL HIGH (ref 9–35)

## 2015-07-25 LAB — CANCER ANTIGEN 19-9: CAN 19-9: 76 U/mL — AB (ref 0–35)

## 2015-07-25 MED ORDER — DEXTROSE 10 % IV SOLN
INTRAVENOUS | Status: DC
  Administered 2015-07-25: 17:00:00 via INTRAVENOUS
  Administered 2015-07-26: 125 mL/h via INTRAVENOUS
  Filled 2015-07-25: qty 1000

## 2015-07-25 MED ORDER — SODIUM CHLORIDE 0.9 % IV BOLUS (SEPSIS)
1000.0000 mL | Freq: Once | INTRAVENOUS | Status: AC
  Administered 2015-07-25: 1000 mL via INTRAVENOUS

## 2015-07-25 MED ORDER — DEXTROSE 50 % IV SOLN
INTRAVENOUS | Status: AC
  Administered 2015-07-25: 50 mL
  Filled 2015-07-25: qty 50

## 2015-07-25 MED ORDER — DEXTROSE 50 % IV SOLN
50.0000 mL | Freq: Once | INTRAVENOUS | Status: AC
  Administered 2015-07-25: 50 mL via INTRAVENOUS
  Filled 2015-07-25: qty 50

## 2015-07-25 MED ORDER — DEXTROSE 50 % IV SOLN
25.0000 mL | Freq: Once | INTRAVENOUS | Status: DC
  Filled 2015-07-25: qty 50

## 2015-07-25 MED ORDER — DEXTROSE 10 % IV SOLN
INTRAVENOUS | Status: DC
  Administered 2015-07-25 – 2015-07-26 (×2): via INTRAVENOUS
  Filled 2015-07-25: qty 1000

## 2015-07-25 NOTE — ED Notes (Signed)
Patient states he self caths. Patient given in and out cath kit.

## 2015-07-25 NOTE — ED Notes (Addendum)
Per EMS, patient has had increased weakness/lethargy over the past few days. Patient follows commands and responsive. Patient has a bilateral drift/weakness. Right below knee amputation Per family patient has been lethargic since yesterday's morphine dose.

## 2015-07-25 NOTE — ED Notes (Signed)
MD aware of patient's blood sugar. MD at bedside.

## 2015-07-25 NOTE — ED Provider Notes (Signed)
CSN: [id number]     Arrival date & time 07/25/15  1102 History   First MD Initiated Contact with Patient 07/25/15 1110     Chief Complaint  Patient presents with  . Weakness  . Fatigue    HPI Patient presents to the emergency room for worsening fatigue and weakness. The patient unfortunately has been recently diagnosed with metastatic cancer, most likely pancreatic biliary primary .  Patient was released from the hospital in follow-up in the oncology clinic yesterday. Unfortunately the patient's bilirubin has not been decreasing despite a biliary drain. He has not been a candidate for surgery or chemotherapy at this point. The patient was seen in the oncology office yesterday. His symptoms have worsened and he is now feeling weaker. Family also states that his blood sugar was very low yesterday at the oncologist's office but they do not think that he was given any treatment for that. Today he continues to be low. They did not give him any of his diabetes medications today. Past Medical History  Diagnosis Date  . Sickle cell trait ([place])   . Normal nuclear stress test 07/2010    low prob of ischemia, EF 42%  . Echocardiogram abnormal     moderate LVH, mild LV hypokenesis, EF 42%  . History of Doppler ultrasound 07/2010    negative for DVT  . Abnormal CT of the abdomen     mild L hydronephrosis and hydroureter  . Status post radiofrequency ablation for arrhythmia 10/16/10    [facility] MD)  . Diabetes mellitus   . Hypertension   . Osteomyelitis ([place]) 12/2014    LEFT FOOT  . Microcytic anemia 02/25/2015  . 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 Followed at [facility] urology.   . Cancer [facility])    Past Surgical History  Procedure Laterality Date  . Radiofrequency ablation  10/16/10    Cardiac for atrial arrythmia, [facility]  . Dental extractions  10/15/10    prior to ablation  . Patella fracture surgery      metal rod left leg  . Tonsillectomy    . Rotator  cuff repair Left 10/2014  . Amputation Left 12/31/2014    Procedure: AMPUTATION BELOW KNEE;  Surgeon: Newt Minion, MD;  Location: [place];  Service: Orthopedics;  Laterality: Left;  . Esophagogastroduodenoscopy (egd) with propofol N/A 03/02/2015    Procedure: ESOPHAGOGASTRODUODENOSCOPY (EGD) WITH PROPOFOL;  Surgeon: Wonda Horner, MD;  Location: [facility] ENDOSCOPY;  Service: Endoscopy;  Laterality: N/A;  . Colonoscopy N/A 03/03/2015    Procedure: COLONOSCOPY;  Surgeon: Wonda Horner, MD;  Location: [facility] ENDOSCOPY;  Service: Endoscopy;  Laterality: N/A;   Family History  Problem Relation Age of Onset  . Sickle cell trait    . Heart failure Father   . Diabetes Father   . Diabetes Mother   . Hypertension Mother    Social History  Substance Use Topics  . Smoking status: Former Smoker -- 1.50 packs/day for 17 years    Types: Cigars    Quit date: 12/15/2014  . Smokeless tobacco: Never Used     Comment: 2 cigars  . Alcohol Use: No    Review of Systems  All other systems reviewed and are negative.     Allergies  Infed and Lactose intolerance (gi)  Home Medications   Prior to Admission medications   Medication Sig Start Date End Date Taking? Authorizing Provider  aspirin EC 81 MG tablet Take 1 tablet (  81 mg total) by mouth daily. 01/03/15  Yes Veatrice Bourbon, MD  camphor-menthol (SARNA) lotion Apply topically as needed for itching. 07/17/15  Yes Hillary Corinda Gubler, MD  feeding supplement, ENSURE ENLIVE, (ENSURE ENLIVE) LIQD Take 237 mLs by mouth 2 (two) times daily between meals. 07/17/15  Yes Hillary Corinda Gubler, MD  ferrous sulfate 325 (65 FE) MG EC tablet Take 325 mg by mouth daily.   Yes Historical Provider, MD  gabapentin (NEURONTIN) 100 MG capsule Please take 100 mg in the morning and mid-day and 300 mg at bedtime. 07/24/15  Yes Volanda Napoleon, MD  glipiZIDE (GLUCOTROL) 10 MG tablet TAKE ONE TABLET BY MOUTH TWICE DAILY BEFORE MEAL(S) 09/07/14  Yes Leone Brand, MD  metFORMIN  (GLUCOPHAGE) 1000 MG tablet Take 1 tablet (1,000 mg total) by mouth 2 (two) times daily with a meal. 09/07/14  Yes Leone Brand, MD  morphine (MS CONTIN) 15 MG 12 hr tablet Take 1 tablet (15 mg total) by mouth every 8 (eight) hours. 07/17/15  Yes Hillary Corinda Gubler, MD  morphine (MSIR) 30 MG tablet Take 1 tablet (30 mg total) by mouth every 6 (six) hours as needed for moderate pain. 07/17/15  Yes Hillary Corinda Gubler, MD  senna (SENOKOT) 8.6 MG TABS tablet Take 2 tablets (17.2 mg total) by mouth at bedtime. 07/17/15  Yes Hillary Corinda Gubler, MD  lisinopril (PRINIVIL,ZESTRIL) 40 MG tablet TAKE ONE TABLET BY MOUTH ONCE DAILY 03/31/15   Leone Brand, MD  ondansetron (ZOFRAN) 8 MG tablet Take 1 tablet (8 mg total) by mouth every 6 (six) hours as needed for nausea. 07/17/15   Hillary Corinda Gubler, MD   BP 93/62 mmHg  Pulse 97  Temp(Src) 98.5 F (36.9 C) (Oral)  Resp 12  Ht 6\' 7"  (2.007 m)  Wt 121.5 kg  BMI 30.16 kg/m2  SpO2 98% Physical Exam  Constitutional: He appears listless. No distress.  HENT:  Head: Normocephalic and atraumatic.  Right Ear: External ear normal.  Left Ear: External ear normal.  Eyes: Conjunctivae are normal. Right eye exhibits no discharge. Left eye exhibits no discharge. Scleral icterus is present.  Neck: Neck supple. No tracheal deviation present.  Cardiovascular: Normal rate, regular rhythm and intact distal pulses.   Pulmonary/Chest: Effort normal and breath sounds normal. No stridor. No respiratory distress. He has no wheezes. He has no rales.  Abdominal: Soft. Bowel sounds are normal. He exhibits no distension. There is no tenderness. There is no rebound and no guarding.  drain in the right side of the abdomen draining bile colored fluid  Musculoskeletal: He exhibits no edema or tenderness.  Neurological: He appears listless. No cranial nerve deficit (no facial droop, extraocular movements intact, no slurred speech) or sensory deficit. He exhibits  normal muscle tone. He displays no seizure activity. Coordination normal.  generalized weakness  Skin: Skin is warm and dry. No rash noted.  Patient is extremely jaundiced  Psychiatric: He has a normal mood and affect.  Nursing note and vitals reviewed.   ED Course  Procedures (including critical care time)  CRITICAL CARE Performed by: [id number] Total critical care time: 40 minutes Critical care time was exclusive of separately billable procedures and treating other patients. Critical care was necessary to treat or prevent imminent or life-threatening deterioration. Critical care was time spent personally by me on the following activities: development of treatment plan with patient and/or surrogate as well as nursing, discussions with consultants, evaluation of patient's response to treatment, examination of  patient, obtaining history from patient or surrogate, ordering and performing treatments and interventions, ordering and review of laboratory studies, ordering and review of radiographic studies, pulse oximetry and re-evaluation of patient's condition.  Labs Review Labs Reviewed  BASIC METABOLIC PANEL - Abnormal; Notable for the following:    Sodium 132 (*)    Potassium 5.7 (*)    Chloride 94 (*)    CO2 19 (*)    Glucose, Bld 26 (*)    BUN 102 (*)    Creatinine, Ser 3.78 (*)    Calcium 8.4 (*)    GFR calc non Af Amer 17 (*)    GFR calc Af Amer 20 (*)    Anion gap 19 (*)    All other components within normal limits  CBC - Abnormal; Notable for the following:    WBC 16.8 (*)    RBC 3.59 (*)    Hemoglobin 8.4 (*)    HCT 23.4 (*)    MCV 65.2 (*)    MCH 23.4 (*)    RDW 18.3 (*)    Platelets 619 (*)    All other components within normal limits  URINALYSIS, ROUTINE W REFLEX MICROSCOPIC (NOT AT [facility]) - Abnormal; Notable for the following:    Color, Urine BROWN (*)    APPearance CLOUDY (*)    Hgb urine dipstick LARGE (*)    Bilirubin Urine LARGE (*)    Leukocytes, UA SMALL  (*)    All other components within normal limits  HEPATIC FUNCTION PANEL - Abnormal; Notable for the following:    Albumin 2.1 (*)    AST 304 (*)    ALT 144 (*)    Alkaline Phosphatase 166 (*)    Total Bilirubin 20.1 (*)    Bilirubin, Direct 13.0 (*)    Indirect Bilirubin 7.1 (*)    All other components within normal limits  PROTIME-INR - Abnormal; Notable for the following:    Prothrombin Time 18.2 (*)    INR 1.50 (*)    All other components within normal limits  AMMONIA - Abnormal; Notable for the following:    Ammonia 77 (*)    All other components within normal limits  URINE MICROSCOPIC-ADD ON - Abnormal; Notable for the following:    Squamous Epithelial / LPF 0-5 (*)    Bacteria, UA FEW (*)    All other components within normal limits  GLUCOSE, CAPILLARY - Abnormal; Notable for the following:    Glucose-Capillary 127 (*)    All other components within normal limits  MAGNESIUM - Abnormal; Notable for the following:    Magnesium 2.8 (*)    All other components within normal limits  PHOSPHORUS - Abnormal; Notable for the following:    Phosphorus 10.2 (*)    All other components within normal limits  COMPREHENSIVE METABOLIC PANEL - Abnormal; Notable for the following:    Sodium 127 (*)    Potassium 6.5 (*)    Chloride 91 (*)    CO2 18 (*)    Glucose, Bld 182 (*)    BUN 113 (*)    Creatinine, Ser 4.44 (*)    Calcium 8.1 (*)    Albumin 1.8 (*)    AST 260 (*)    ALT 133 (*)    Alkaline Phosphatase 150 (*)    Total Bilirubin 19.7 (*)    GFR calc non Af Amer 14 (*)    GFR calc Af Amer 16 (*)    Anion gap 18 (*)  All other components within normal limits  CBC - Abnormal; Notable for the following:    WBC 18.9 (*)    RBC 3.40 (*)    Hemoglobin 8.0 (*)    HCT 22.7 (*)    MCV 66.8 (*)    MCH 23.5 (*)    RDW 18.6 (*)    Platelets 622 (*)    All other components within normal limits  PROTIME-INR - Abnormal; Notable for the following:    Prothrombin Time 17.6 (*)     All other components within normal limits  VITAMIN B12 - Abnormal; Notable for the following:    Vitamin B-12 2097 (*)    All other components within normal limits  IRON AND TIBC - Abnormal; Notable for the following:    TIBC 168 (*)    All other components within normal limits  FERRITIN - Abnormal; Notable for the following:    Ferritin 2666 (*)    All other components within normal limits  RETICULOCYTES - Abnormal; Notable for the following:    RBC. 3.40 (*)    All other components within normal limits  GLUCOSE, CAPILLARY - Abnormal; Notable for the following:    Glucose-Capillary 173 (*)    All other components within normal limits  GLUCOSE, CAPILLARY - Abnormal; Notable for the following:    Glucose-Capillary 221 (*)    All other components within normal limits  BASIC METABOLIC PANEL - Abnormal; Notable for the following:    Sodium 127 (*)    Potassium 5.4 (*)    Chloride 91 (*)    CO2 17 (*)    Glucose, Bld 243 (*)    BUN 112 (*)    Creatinine, Ser 4.42 (*)    Calcium 7.9 (*)    GFR calc non Af Amer 14 (*)    GFR calc Af Amer 16 (*)    Anion gap 19 (*)    All other components within normal limits  GLUCOSE, CAPILLARY - Abnormal; Notable for the following:    Glucose-Capillary 232 (*)    All other components within normal limits  RENAL FUNCTION PANEL - Abnormal; Notable for the following:    Sodium 127 (*)    Potassium 5.3 (*)    Chloride 93 (*)    CO2 16 (*)    Glucose, Bld 231 (*)    BUN 117 (*)    Creatinine, Ser 4.53 (*)    Calcium 7.6 (*)    Phosphorus 9.2 (*)    Albumin 1.6 (*)    GFR calc non Af Amer 14 (*)    GFR calc Af Amer 16 (*)    Anion gap 18 (*)    All other components within normal limits  GLUCOSE, CAPILLARY - Abnormal; Notable for the following:    Glucose-Capillary 241 (*)    All other components within normal limits  GLUCOSE, CAPILLARY - Abnormal; Notable for the following:    Glucose-Capillary 234 (*)    All other components within  normal limits  CBC - Abnormal; Notable for the following:    WBC 18.1 (*)    RBC 3.00 (*)    Hemoglobin 7.1 (*)    HCT 19.7 (*)    MCV 65.7 (*)    MCH 23.7 (*)    RDW 18.3 (*)    Platelets 601 (*)    All other components within normal limits  RENAL FUNCTION PANEL - Abnormal; Notable for the following:    Sodium 133 (*)    Potassium  5.6 (*)    Chloride 97 (*)    CO2 18 (*)    BUN 122 (*)    Creatinine, Ser 4.67 (*)    Calcium 7.8 (*)    Phosphorus 10.1 (*)    Albumin 1.5 (*)    GFR calc non Af Amer 13 (*)    GFR calc Af Amer 15 (*)    Anion gap 18 (*)    All other components within normal limits  GLUCOSE, CAPILLARY - Abnormal; Notable for the following:    Glucose-Capillary 123 (*)    All other components within normal limits  GLUCOSE, CAPILLARY - Abnormal; Notable for the following:    Glucose-Capillary 111 (*)    All other components within normal limits  CBG MONITORING, ED - Abnormal; Notable for the following:    Glucose-Capillary 16 (*)    All other components within normal limits  CBG MONITORING, ED - Abnormal; Notable for the following:    Glucose-Capillary 13 (*)    All other components within normal limits  CBG MONITORING, ED - Abnormal; Notable for the following:    Glucose-Capillary 55 (*)    All other components within normal limits  CBG MONITORING, ED - Abnormal; Notable for the following:    Glucose-Capillary 128 (*)    All other components within normal limits  CBG MONITORING, ED - Abnormal; Notable for the following:    Glucose-Capillary 120 (*)    All other components within normal limits  CBG MONITORING, ED - Abnormal; Notable for the following:    Glucose-Capillary 114 (*)    All other components within normal limits  CBG MONITORING, ED - Abnormal; Notable for the following:    Glucose-Capillary 108 (*)    All other components within normal limits  CBG MONITORING, ED - Abnormal; Notable for the following:    Glucose-Capillary 106 (*)    All  other components within normal limits  CBG MONITORING, ED - Abnormal; Notable for the following:    Glucose-Capillary 61 (*)    All other components within normal limits  MRSA PCR SCREENING  URINE CULTURE  APTT  APTT  SODIUM, URINE, RANDOM  CREATININE, URINE, RANDOM  CK  FOLATE  GLUCOSE, CAPILLARY  MAGNESIUM  HEPATIC FUNCTION PANEL  CBG MONITORING, ED  CBG MONITORING, ED  CBG MONITORING, ED  CBG MONITORING, ED  CBG MONITORING, ED    Imaging Review US Renal  07/26/2015  CLINICAL DATA:  Acute kidney injury EXAM: RENAL / URINARY TRACT ULTRASOUND COMPLETE COMPARISON:  02/27/2015 FINDINGS: Right Kidney: Length: 14 cm, decreased in size from prior. Echogenicity within normal limits. No mass or hydronephrosis visualized. Left Kidney: Length: 14 cm. Chronic left hydronephrosis which is mild to moderate. The degree of dilatation is improved compared to comparison study. Normal cortical thickness. No evidence of mass. Bladder: Decompressed by Foley catheter. IMPRESSION: 1. Mild to moderate left hydronephrosis which is chronic based on comparison imaging. 2. Decompressed bladder with Foley catheter. Electronically Signed   By: Monte Fantasia M.D.   On: 07/26/2015 14:58   I have personally reviewed and evaluated these images and lab results as part of my medical decision-making.   EKG Interpretation   Date/Time:  Tuesday July 25 2015 11:21:35 EDT Ventricular Rate:  73 PR Interval:  180 QRS Duration: 111 QT Interval:  436 QTC Calculation: 480 R Axis:   23 Text Interpretation:  Sinus rhythm Low voltage, precordial leads Abnormal  R-wave progression, early transition Borderline prolonged QT interval ,  new since  last tracing Confirmed by Siddiq Kaluzny  MD-J, Roslind Michaux [id number]) on 07/25/2015  12:13:25 PM        MDM   Final diagnoses:  Biliary obstruction  Hypoglycemia    Pt presented to the ED with increasing weakness.  He has an unfortunate recent diagnosis of metastatic cancer most likely  primary biliary resulting in hyperbilirubinemia.  Pt had a drain placed but has not had any improvement in his bilirubin.  Pt was discharged from the hospital recently and followed up in the oncology clinic.  Noted to be hypoglycemic the day before his visit to the ED.  In the ED he is noted to be in renal failure, liver failure and now hypoglycemic.  Pt was given several doses of D50.  Continued to be hypoglycemic.  Started on a d10w drip.  Foley catheter was ordered however the patient refused catheterization.  I had to convince him and after about 15 minutes he Ultimately he agreed to an in an out cath after he could rest for 30 minutes.  Plan on transfer to [facility] to be admitted by his PCP, family practice clinic.     Dorie Rank, MD 07/27/15 [phone]

## 2015-07-25 NOTE — ED Notes (Signed)
Patient states he has not urinated since yesterday. Bladder scan performed and shows >926mL in bladder. Dr. Tomi Bamberger made aware.

## 2015-07-25 NOTE — ED Notes (Signed)
MD aware of patients CBG.  

## 2015-07-25 NOTE — Progress Notes (Addendum)
ED CM noted Cm consult for pt and family interested in home health services ED Cm reviewed home health and private duty nursing services differences  CM reviewed in details medicare guidelines, home health [facility]) (length of stay in home, types of [facility] staff available, coverage, primary caregiver, up to 24 hrs before services may be started) and Private duty nursing (PDN-coverage, length of stay in the home types of staff available). CM reviewed availability of [place] [facility] to assist pcp to get pt to snf (if desired disposition) from the community level. CM provided pt/family with a list of [place] home health agencies and PDN.  CM discussed CAPs through pcp office and long term care policies for medicaid to pay for PDN  Discussed out of pocket expense for PDN  Discussed [facility] availability to bill for services  Pt wants to continue to think about need of services and ask about it if needed before d/c  Confirmed Dr Lamar Benes [facility] Pt sat decreased to 86% with Cm in the room Pt was not in distressed encouraged to take a deep breath and went up to 97% ED NT came in room to adjust cuff and evaluate pt  Pt encouraged to call DSS Wife and sister at bedside Wife given DSS contact number to call to check on pt Medicaid since verified as inactive by ED registration staff Pt medicaid card states issued on 04/24/15 Pt voiced concern about owing a bill for CHS at this time.  Pt encouraged to call back to hospital if medicaid active to apply towards his bill

## 2015-07-25 NOTE — ED Provider Notes (Signed)
  Physical Exam  BP 157/111 mmHg  Pulse 90  Temp(Src) 97.4 F (36.3 C) (Oral)  Resp 12  Ht 6\' 7"  (2.007 m)  Wt 276 lb (125.193 kg)  BMI 31.08 kg/m2  SpO2 93%  Physical Exam  ED Course  Procedures  MDM Patient had a return of his hyperglycemia. Was on D 10 at 125. Increase to 175 and given bolus. Had been held pending admission to [facility]. Discussed with the control now state that they will have transfer for him very soon. States the room is almost clean. Will discuss with family practice but well still transfer to [facility].  Davonna Belling, MD 07/25/15 [phone]

## 2015-07-26 ENCOUNTER — Inpatient Hospital Stay (HOSPITAL_COMMUNITY): Payer: Self-pay

## 2015-07-26 DIAGNOSIS — Z7189 Other specified counseling: Secondary | ICD-10-CM | POA: Insufficient documentation

## 2015-07-26 DIAGNOSIS — Z515 Encounter for palliative care: Secondary | ICD-10-CM

## 2015-07-26 DIAGNOSIS — N179 Acute kidney failure, unspecified: Secondary | ICD-10-CM | POA: Insufficient documentation

## 2015-07-26 DIAGNOSIS — C801 Malignant (primary) neoplasm, unspecified: Secondary | ICD-10-CM

## 2015-07-26 DIAGNOSIS — C787 Secondary malignant neoplasm of liver and intrahepatic bile duct: Secondary | ICD-10-CM

## 2015-07-26 DIAGNOSIS — E162 Hypoglycemia, unspecified: Secondary | ICD-10-CM | POA: Diagnosis present

## 2015-07-26 LAB — MAGNESIUM: Magnesium: 2.8 mg/dL — ABNORMAL HIGH (ref 1.7–2.4)

## 2015-07-26 LAB — BASIC METABOLIC PANEL
Anion gap: 19 — ABNORMAL HIGH (ref 5–15)
BUN: 112 mg/dL — ABNORMAL HIGH (ref 6–20)
CALCIUM: 7.9 mg/dL — AB (ref 8.9–10.3)
CO2: 17 mmol/L — ABNORMAL LOW (ref 22–32)
CREATININE: 4.42 mg/dL — AB (ref 0.61–1.24)
Chloride: 91 mmol/L — ABNORMAL LOW (ref 101–111)
GFR, EST AFRICAN AMERICAN: 16 mL/min — AB (ref >60)
GFR, EST NON AFRICAN AMERICAN: 14 mL/min — AB (ref >60)
Glucose, Bld: 243 mg/dL — ABNORMAL HIGH (ref 65–99)
Potassium: 5.4 mmol/L — ABNORMAL HIGH (ref 3.5–5.1)
SODIUM: 127 mmol/L — AB (ref 135–145)

## 2015-07-26 LAB — COMPREHENSIVE METABOLIC PANEL
ALBUMIN: 1.8 g/dL — AB (ref 3.5–5.0)
ALK PHOS: 150 U/L — AB (ref 38–126)
ALT: 133 U/L — ABNORMAL HIGH (ref 17–63)
ANION GAP: 18 — AB (ref 5–15)
AST: 260 U/L — AB (ref 15–41)
BILIRUBIN TOTAL: 19.7 mg/dL — AB (ref 0.3–1.2)
BUN: 113 mg/dL — AB (ref 6–20)
CALCIUM: 8.1 mg/dL — AB (ref 8.9–10.3)
CO2: 18 mmol/L — AB (ref 22–32)
Chloride: 91 mmol/L — ABNORMAL LOW (ref 101–111)
Creatinine, Ser: 4.44 mg/dL — ABNORMAL HIGH (ref 0.61–1.24)
GFR calc Af Amer: 16 mL/min — ABNORMAL LOW (ref >60)
GFR calc non Af Amer: 14 mL/min — ABNORMAL LOW (ref >60)
GLUCOSE: 182 mg/dL — AB (ref 65–99)
Potassium: 6.5 mmol/L (ref 3.5–5.1)
SODIUM: 127 mmol/L — AB (ref 135–145)
TOTAL PROTEIN: 6.5 g/dL (ref 6.5–8.1)

## 2015-07-26 LAB — CBC
HEMATOCRIT: 22.7 % — AB (ref 39.0–52.0)
HEMOGLOBIN: 8 g/dL — AB (ref 13.0–17.0)
MCH: 23.5 pg — AB (ref 26.0–34.0)
MCHC: 35.2 g/dL (ref 30.0–36.0)
MCV: 66.8 fL — ABNORMAL LOW (ref 78.0–100.0)
Platelets: 622 10*3/uL — ABNORMAL HIGH (ref 150–400)
RBC: 3.4 MIL/uL — ABNORMAL LOW (ref 4.22–5.81)
RDW: 18.6 % — AB (ref 11.5–15.5)
WBC: 18.9 10*3/uL — ABNORMAL HIGH (ref 4.0–10.5)

## 2015-07-26 LAB — FERRITIN: Ferritin: 2666 ng/mL — ABNORMAL HIGH (ref 24–336)

## 2015-07-26 LAB — GLUCOSE, CAPILLARY
GLUCOSE-CAPILLARY: 127 mg/dL — AB (ref 65–99)
GLUCOSE-CAPILLARY: 173 mg/dL — AB (ref 65–99)
GLUCOSE-CAPILLARY: 232 mg/dL — AB (ref 65–99)
GLUCOSE-CAPILLARY: 123 mg/dL — AB (ref 65–99)
Glucose-Capillary: 221 mg/dL — ABNORMAL HIGH (ref 65–99)
Glucose-Capillary: 241 mg/dL — ABNORMAL HIGH (ref 65–99)
Glucose-Capillary: 234 mg/dL — ABNORMAL HIGH (ref 65–99)

## 2015-07-26 LAB — CREATININE, URINE, RANDOM: CREATININE, URINE: 122.89 mg/dL

## 2015-07-26 LAB — PROTIME-INR
INR: 1.44 (ref 0.00–1.49)
PROTHROMBIN TIME: 17.6 s — AB (ref 11.6–15.2)

## 2015-07-26 LAB — RENAL FUNCTION PANEL
Albumin: 1.6 g/dL — ABNORMAL LOW (ref 3.5–5.0)
Anion gap: 18 — ABNORMAL HIGH (ref 5–15)
BUN: 117 mg/dL — AB (ref 6–20)
CALCIUM: 7.6 mg/dL — AB (ref 8.9–10.3)
CO2: 16 mmol/L — AB (ref 22–32)
CREATININE: 4.53 mg/dL — AB (ref 0.61–1.24)
Chloride: 93 mmol/L — ABNORMAL LOW (ref 101–111)
GFR calc Af Amer: 16 mL/min — ABNORMAL LOW (ref >60)
GFR calc non Af Amer: 14 mL/min — ABNORMAL LOW (ref >60)
GLUCOSE: 231 mg/dL — AB (ref 65–99)
Phosphorus: 9.2 mg/dL — ABNORMAL HIGH (ref 2.5–4.6)
Potassium: 5.3 mmol/L — ABNORMAL HIGH (ref 3.5–5.1)
SODIUM: 127 mmol/L — AB (ref 135–145)

## 2015-07-26 LAB — RETICULOCYTES
RBC.: 3.4 MIL/uL — ABNORMAL LOW (ref 4.22–5.81)
RETIC COUNT ABSOLUTE: 71.4 10*3/uL (ref 19.0–186.0)
Retic Ct Pct: 2.1 % (ref 0.4–3.1)

## 2015-07-26 LAB — CK: CK TOTAL: 377 U/L (ref 49–397)

## 2015-07-26 LAB — IRON AND TIBC
IRON: 57 ug/dL (ref 45–182)
Saturation Ratios: 34 % (ref 17.9–39.5)
TIBC: 168 ug/dL — AB (ref 250–450)
UIBC: 111 ug/dL

## 2015-07-26 LAB — FOLATE: FOLATE: 14.9 ng/mL (ref >5.9)

## 2015-07-26 LAB — CBG MONITORING, ED
GLUCOSE-CAPILLARY: 79 mg/dL (ref 65–99)
GLUCOSE-CAPILLARY: 87 mg/dL (ref 65–99)

## 2015-07-26 LAB — MRSA PCR SCREENING: MRSA BY PCR: NEGATIVE

## 2015-07-26 LAB — APTT: aPTT: 26 seconds (ref 24–37)

## 2015-07-26 LAB — PHOSPHORUS: Phosphorus: 10.2 mg/dL — ABNORMAL HIGH (ref 2.5–4.6)

## 2015-07-26 LAB — VITAMIN B12: Vitamin B-12: 2097 pg/mL — ABNORMAL HIGH (ref 180–914)

## 2015-07-26 LAB — SODIUM, URINE, RANDOM: Sodium, Ur: 24 mmol/L

## 2015-07-26 MED ORDER — DEXTROSE 5 % IV SOLN
1.0000 g | INTRAVENOUS | Status: DC
  Administered 2015-07-26: 1 g via INTRAVENOUS
  Filled 2015-07-26 (×2): qty 10

## 2015-07-26 MED ORDER — FERROUS SULFATE 325 (65 FE) MG PO TABS
325.0000 mg | ORAL_TABLET | Freq: Every day | ORAL | Status: DC
  Administered 2015-07-26: 325 mg via ORAL
  Filled 2015-07-26: qty 1

## 2015-07-26 MED ORDER — GI COCKTAIL ~~LOC~~
30.0000 mL | Freq: Three times a day (TID) | ORAL | Status: DC | PRN
  Administered 2015-07-26 – 2015-07-27 (×2): 30 mL via ORAL
  Filled 2015-07-26 (×2): qty 30

## 2015-07-26 MED ORDER — DEXTROSE-NACL 5-0.9 % IV SOLN
INTRAVENOUS | Status: DC
  Administered 2015-07-26: 09:00:00 via INTRAVENOUS

## 2015-07-26 MED ORDER — SODIUM CHLORIDE 0.9 % IV SOLN
INTRAVENOUS | Status: DC
  Administered 2015-07-26 – 2015-07-27 (×3): via INTRAVENOUS

## 2015-07-26 MED ORDER — SODIUM POLYSTYRENE SULFONATE 15 GM/60ML PO SUSP
30.0000 g | Freq: Once | ORAL | Status: AC
  Administered 2015-07-26: 30 g via ORAL
  Filled 2015-07-26 (×2): qty 120

## 2015-07-26 MED ORDER — HEPARIN SODIUM (PORCINE) 5000 UNIT/ML IJ SOLN
5000.0000 [IU] | Freq: Three times a day (TID) | INTRAMUSCULAR | Status: DC
  Administered 2015-07-26 – 2015-07-27 (×4): 5000 [IU] via SUBCUTANEOUS
  Filled 2015-07-26 (×5): qty 1

## 2015-07-26 MED ORDER — WHITE PETROLATUM GEL
Status: AC
  Administered 2015-07-26: 0.2
  Filled 2015-07-26: qty 1

## 2015-07-26 MED ORDER — SUCRALFATE 1 GM/10ML PO SUSP
1.0000 g | Freq: Four times a day (QID) | ORAL | Status: DC
  Administered 2015-07-26 (×2): 1 g via ORAL
  Filled 2015-07-26 (×3): qty 10

## 2015-07-26 MED ORDER — PANTOPRAZOLE SODIUM 40 MG PO TBEC
40.0000 mg | DELAYED_RELEASE_TABLET | Freq: Two times a day (BID) | ORAL | Status: DC
  Administered 2015-07-26 – 2015-07-28 (×5): 40 mg via ORAL
  Filled 2015-07-26 (×5): qty 1

## 2015-07-26 MED ORDER — ACETAMINOPHEN 325 MG PO TABS: 650.0000 mg | ORAL_TABLET | Freq: Four times a day (QID) | ORAL | Status: DC | PRN

## 2015-07-26 MED ORDER — SODIUM CHLORIDE 0.9 % IV BOLUS (SEPSIS)
1000.0000 mL | Freq: Once | INTRAVENOUS | Status: AC
  Administered 2015-07-26: 1000 mL via INTRAVENOUS

## 2015-07-26 MED ORDER — MORPHINE SULFATE 30 MG PO TABS: 30.0000 mg | ORAL_TABLET | Freq: Four times a day (QID) | ORAL | Status: DC | PRN

## 2015-07-26 MED ORDER — POLYETHYLENE GLYCOL 3350 17 G PO PACK: 17.0000 g | PACK | Freq: Every day | ORAL | Status: DC | PRN

## 2015-07-26 MED ORDER — SODIUM CHLORIDE 0.9 % IV SOLN
1.0000 g | Freq: Once | INTRAVENOUS | Status: AC
  Administered 2015-07-26: 1 g via INTRAVENOUS
  Filled 2015-07-26 (×2): qty 10

## 2015-07-26 MED ORDER — SENNA 8.6 MG PO TABS
2.0000 | ORAL_TABLET | Freq: Every day | ORAL | Status: DC
  Administered 2015-07-26: 17.2 mg via ORAL
  Filled 2015-07-26: qty 2

## 2015-07-26 MED ORDER — MORPHINE SULFATE ER 15 MG PO TBCR
15.0000 mg | EXTENDED_RELEASE_TABLET | Freq: Three times a day (TID) | ORAL | Status: DC
  Filled 2015-07-26: qty 1

## 2015-07-26 MED ORDER — ONDANSETRON HCL 4 MG PO TABS: 8.0000 mg | ORAL_TABLET | Freq: Four times a day (QID) | ORAL | Status: DC | PRN

## 2015-07-26 MED ORDER — ENSURE ENLIVE PO LIQD
237.0000 mL | Freq: Two times a day (BID) | ORAL | Status: DC
  Administered 2015-07-26 – 2015-07-27 (×4): 237 mL via ORAL

## 2015-07-26 MED ORDER — FENTANYL CITRATE (PF) 100 MCG/2ML IJ SOLN
25.0000 ug | INTRAMUSCULAR | Status: DC | PRN
  Filled 2015-07-26: qty 2

## 2015-07-26 MED ORDER — ACETAMINOPHEN 650 MG RE SUPP: 650.0000 mg | Freq: Four times a day (QID) | RECTAL | Status: DC | PRN
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
## 2015-07-26 NOTE — ED Notes (Signed)
Called [phone] to give report

## 2015-07-26 NOTE — ED Notes (Signed)
Provider verbalized rate change 175/ ml/hr
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
## 2015-07-26 NOTE — Progress Notes (Signed)
Inpatient Diabetes Program Recommendations  AACE/ADA: New Consensus Statement on Inpatient Glycemic Control (2015)  Target Ranges:  Prepandial:   less than 140 mg/dL      Peak postprandial:   less than 180 mg/dL (1-2 hours)      Critically ill patients:  140 - 180 mg/dL   Results for Ray Hall, Ray Hall (MRN [id number]) as of 07/26/2015 10:07  Ref. Range 07/25/2015 11:41 07/25/2015 11:42 07/25/2015 12:07 07/25/2015 13:07 07/25/2015 14:03 07/25/2015 15:10 07/25/2015 16:22 07/25/2015 18:25 07/25/2015 19:43 07/25/2015 21:06 07/25/2015 22:11 07/25/2015 23:23 07/26/2015 00:36 07/26/2015 02:11 07/26/2015 03:20 07/26/2015 04:52 07/26/2015 06:23 07/26/2015 09:08  Glucose-Capillary Latest Ref Range: 65-99 mg/dL 16 (LL) 13 (LL) 98 55 (L) 128 (H) 120 (H) 114 (H) 108 (H) 99 106 (H) 82 61 (L) 87 79 127 (H) 173 (H) 221 (H) 232 (H)    Admit with: Severe Hypoglycemia/ Cholangiocarcinoma  History: DM2  Home DM Meds: Glipizide 10 mg bid           Metformin 1000 mg bid  Current Insulin Orders: None yet      MD- Since patient has liver mass, may want to consider discontinuation of Metformin from home DM medication regimen.  May want to consider using medications that have low risk of Hypoglycemia.  Not sure what patient will be able to afford as he is listed as No Insurance.  Please also consider starting Novolog Sensitive Correction Scale/ SSI (0-9 units) Q4 hours once glucose levels have stabilized.     --Will follow patient during hospitalization--  Wyn Quaker RN, MSN, CDE Diabetes Coordinator Inpatient Glycemic Control Team Team Pager: [phone] (8a-5p)

## 2015-07-26 NOTE — Evaluation (Signed)
Physical Therapy Evaluation Patient Details Name: Ray Hall MRN: [id number] DOB: [birth year] Today's Date: 07/26/2015   History of Present Illness  Ray Hall is a 55 y.o. male presenting with hypoglycemia. PMH is significant for recently diagnosed cholangio-carcinoma, T2DM, HTN, microcytic anemia, neurogenic bladder with h/o recurrent UTIs and renal failure, and left BKA.   Clinical Impression  Pt admitted with above diagnosis. Pt currently with functional limitations due to the deficits listed below (see PT Problem List). Limited assessment as pt refused to get OOB. Pt stated that he was just too tired today because he did not sleep well but he would try and walk tomorrow if his wife would bring his prothesis. Pt will benefit from skilled PT to increase their independence and safety with mobility to allow discharge to the venue listed below.       Follow Up Recommendations Home health PT;Supervision/Assistance - 24 hour    Equipment Recommendations  None recommended by PT    Recommendations for Other Services OT consult     Precautions / Restrictions Precautions Precautions: Fall Restrictions Weight Bearing Restrictions: No      Mobility  Bed Mobility Overal bed mobility: Modified Independent             General bed mobility comments: Pt refused to get up out of bed, but was able to roll independently with use of bedrails.   Transfers                 General transfer comment: Pt refused  Ambulation/Gait             General Gait Details: Pt refused, did not have prosthesis   Stairs            Wheelchair Mobility    Modified Rankin (Stroke Patients Only)       Balance                                             Pertinent Vitals/Pain Pain Assessment: No/denies pain    Home Living Family/patient expects to be discharged to:: Private residence Living Arrangements: Spouse/significant other Available Help at  Discharge: Family;Available 24 hours/day Type of Home: House Home Access: Level entry     Home Layout: One level Home Equipment: Walker - 2 wheels;Wheelchair - manual;Cane - single point;Tub bench;Hand held shower head;Adaptive equipment      Prior Function Level of Independence: Independent with assistive device(s)         Comments: wears prosthetic 10 min 2 x/day;      Hand Dominance   Dominant Hand: Right    Extremity/Trunk Assessment   Upper Extremity Assessment: Defer to OT evaluation           Lower Extremity Assessment: Generalized weakness      Cervical / Trunk Assessment: Normal  Communication   Communication: No difficulties  Cognition Arousal/Alertness: Lethargic Behavior During Therapy: Flat affect Overall Cognitive Status: Within Functional Limits for tasks assessed                      General Comments General comments (skin integrity, edema, etc.): Pt had very flat affect and refused to get out of bed. Pt stated that he just did not have the energy today and that he needed to rest because he did not sleep well last night.     Exercises  Assessment/Plan    PT Assessment Patient needs continued PT services  PT Diagnosis Generalized weakness   PT Problem List Decreased strength;Decreased activity tolerance;Obesity;Decreased mobility  PT Treatment Interventions Functional mobility training;Therapeutic activities;Therapeutic exercise;Balance training;Gait training;Patient/family education   PT Goals (Current goals can be found in the Care Plan section) Acute Rehab PT Goals Patient Stated Goal: to go home PT Goal Formulation: With patient Time For Goal Achievement: 08-28-15 Potential to Achieve Goals: Fair    Frequency Min 3X/week   Barriers to discharge        Co-evaluation               End of Session   Activity Tolerance: Patient limited by lethargy;Patient limited by fatigue Patient left: in bed;with bed alarm  set;with call bell/phone within reach Nurse Communication: Mobility status         Time: 1208-1221 PT Time Calculation (min) (ACUTE ONLY): 13 min   Charges:   PT Evaluation $PT Eval Moderate Complexity: 1 Procedure     PT G Codes:       Colon Branch, SPT Colon Branch 07/26/2015, 1:39 PM

## 2015-07-26 NOTE — Progress Notes (Signed)
Attempted to insert foley cath per MD orders, however, patient refused. Patient stated, "I in and out cath myself earlier today. I don't want no foley cath". Patient was educated on the risk of frequent in and out cathing in a hospital setting and was adament that he did not want a foley cath placed. Endorsed to primary nurse.

## 2015-07-26 NOTE — Progress Notes (Signed)
Referring Physician(s): Dr Dossie Arbour  Supervising Physician: Daryll Brod  Chief Complaint:  Cholangiocarcinoma Int/external biliary drain placed in IR 07/12/15   Subjective:  Re admitted 3/28 with weakness; hyponatremia Renal failure  I/E bili drain in place Draining well TB 19.7 (16.3 at dc 3/20)   Allergies: Infed and Lactose intolerance (gi)  Medications: Prior to Admission medications   Medication Sig Start Date End Date Taking? Authorizing Provider  aspirin EC 81 MG tablet Take 1 tablet (81 mg total) by mouth daily. 01/03/15  Yes Veatrice Bourbon, MD  camphor-menthol (SARNA) lotion Apply topically as needed for itching. 07/17/15  Yes Hillary Corinda Gubler, MD  feeding supplement, ENSURE ENLIVE, (ENSURE ENLIVE) LIQD Take 237 mLs by mouth 2 (two) times daily between meals. 07/17/15  Yes Hillary Corinda Gubler, MD  ferrous sulfate 325 (65 FE) MG EC tablet Take 325 mg by mouth daily.   Yes Historical Provider, MD  gabapentin (NEURONTIN) 100 MG capsule Please take 100 mg in the morning and mid-day and 300 mg at bedtime. 07/24/15  Yes Volanda Napoleon, MD  glipiZIDE (GLUCOTROL) 10 MG tablet TAKE ONE TABLET BY MOUTH TWICE DAILY BEFORE MEAL(S) 09/07/14  Yes Leone Brand, MD  metFORMIN (GLUCOPHAGE) 1000 MG tablet Take 1 tablet (1,000 mg total) by mouth 2 (two) times daily with a meal. 09/07/14  Yes Leone Brand, MD  morphine (MS CONTIN) 15 MG 12 hr tablet Take 1 tablet (15 mg total) by mouth every 8 (eight) hours. 07/17/15  Yes Hillary Corinda Gubler, MD  morphine (MSIR) 30 MG tablet Take 1 tablet (30 mg total) by mouth every 6 (six) hours as needed for moderate pain. 07/17/15  Yes Hillary Corinda Gubler, MD  senna (SENOKOT) 8.6 MG TABS tablet Take 2 tablets (17.2 mg total) by mouth at bedtime. 07/17/15  Yes Hillary Corinda Gubler, MD  lisinopril (PRINIVIL,ZESTRIL) 40 MG tablet TAKE ONE TABLET BY MOUTH ONCE DAILY 03/31/15   Leone Brand, MD  ondansetron (ZOFRAN) 8 MG tablet  Take 1 tablet (8 mg total) by mouth every 6 (six) hours as needed for nausea. 07/17/15   Rogue Bussing, MD     Vital Signs: BP 95/60 mmHg  Pulse 91  Temp(Src) 98.3 F (36.8 C) (Oral)  Resp 14  Ht 6\' 7"  (2.007 m)  Wt 263 lb 14.3 oz (119.7 kg)  BMI 29.72 kg/m2  SpO2 100%  Physical Exam  Constitutional: He appears well-developed.  Eyes: Scleral icterus is present.  Abdominal: Soft. Bowel sounds are normal.  Skin: Skin is warm and dry.  Site of drain is clean and dry Dressing soiled---needs new  Output 75 cc in 24 hrs Bile output  Nursing note and vitals reviewed.   Imaging: US Renal  07/26/2015  CLINICAL DATA:  Acute kidney injury EXAM: RENAL / URINARY TRACT ULTRASOUND COMPLETE COMPARISON:  02/27/2015 FINDINGS: Right Kidney: Length: 14 cm, decreased in size from prior. Echogenicity within normal limits. No mass or hydronephrosis visualized. Left Kidney: Length: 14 cm. Chronic left hydronephrosis which is mild to moderate. The degree of dilatation is improved compared to comparison study. Normal cortical thickness. No evidence of mass. Bladder: Decompressed by Foley catheter. IMPRESSION: 1. Mild to moderate left hydronephrosis which is chronic based on comparison imaging. 2. Decompressed bladder with Foley catheter. Electronically Signed   By: Monte Fantasia M.D.   On: 07/26/2015 14:58    Labs:  CBC:  Recent Labs  07/17/15 0551 07/24/15 1200 07/25/15 1131 07/26/15 0450  WBC  10.8* 14.5* 16.8* 18.9*  HGB 10.4* 9.0* 8.4* 8.0*  HCT 29.0* 23.7* 23.4* 22.7*  PLT 424* 653* 619* 622*    COAGS:  Recent Labs  07/07/15 1848 07/12/15 0529 07/25/15 1220 07/26/15 0450  INR 1.11 1.28 1.50* 1.44  APTT  --   --  36 26    BMP:  Recent Labs  07/17/15 0551 07/24/15 1201 07/25/15 1131 07/26/15 0450 07/26/15 1029  NA 133* 128* 132* 127* 127*  K 4.6 5.2* 5.7* 6.5* 5.4*  CL 96*  --  94* 91* 91*  CO2 25 17* 19* 18* 17*  GLUCOSE 183* 22 Repeated and Verified* 26*  182* 243*  BUN 27* 83.2* 102* 113* 112*  CALCIUM 9.2 9.3 8.4* 8.1* 7.9*  CREATININE 1.38* 3.0* 3.78* 4.44* 4.42*  GFRNONAA 57*  --  17* 14* 14*  GFRAA >60  --  20* 16* 16*    LIVER FUNCTION TESTS:  Recent Labs  07/17/15 0551 07/24/15 1201 07/25/15 1220 07/26/15 0450  BILITOT 16.3* 20.32 Result Confirmed by Automated Dilution.* 20.1* 19.7*  AST 199* 180 Repeated and Verified* 304* 260*  ALT 72* 112* 144* 133*  ALKPHOS 299* 198* 166* 150*  PROT 7.1 7.5 7.3 6.5  ALBUMIN 2.2* 2.0* 2.1* 1.8*    Assessment and Plan:  Cholangiocarcinoma I/E bili drain placed 3/15 in IR will keep on radar in IR   Electronically Signed: Abisai Coble A 07/26/2015, 3:55 PM   I spent a total of 15 Minutes at the the patient's bedside AND on the patient's hospital floor or unit, greater than 50% of which was counseling/coordinating care for I/E biliary drain

## 2015-07-26 NOTE — Progress Notes (Signed)
MD notified of critical total bili of 19.7 and critical K+ of 6.5 this AM. Awaiting orders.

## 2015-07-26 NOTE — Progress Notes (Signed)
Pharmacy Antibiotic Note  Ray Hall is a 55 y.o. male admitted on 07/25/2015 with UTI.  Pharmacy has been consulted for Rocephin dosing.  Plan: Rocephin 1g IV Q24H.  Pharmacy will sign off as dose adjustment unnecessary.  Will be happy to assist further if reconsulted.  Height: 6\' 7"  (200.7 cm) Weight: 263 lb 14.3 oz (119.7 kg) IBW/kg (Calculated) : 93.7  Temp (24hrs), Avg:98.2 F (36.8 C), Min:97.4 F (36.3 C), Max:98.8 F (37.1 C)   Recent Labs Lab 07/24/15 1200 07/24/15 1201 07/25/15 1131 07/26/15 0450  WBC 14.5*  --  16.8* 18.9*  CREATININE  --  3.0* 3.78* 4.44*    Estimated Creatinine Clearance: 28.7 mL/min (by C-G formula based on Cr of 4.44).    Allergies  Allergen Reactions  . Infed [Iron Dextran] Anaphylaxis    Rapid response called - trouble breathing and hypotensive. Treated with 1 Epi, SoluMedrol, and Benadryl.   . Lactose Intolerance (Gi) Other (See Comments)    Gas & heartburn      Thank you for allowing pharmacy to be a part of this patient's care.  Wynona Neat, PharmD, BCPS  07/26/2015 6:55 AM

## 2015-07-27 ENCOUNTER — Other Ambulatory Visit: Payer: Self-pay

## 2015-07-27 ENCOUNTER — Inpatient Hospital Stay: Admission: RE | Admit: 2015-07-27 | Payer: Self-pay | Source: Ambulatory Visit

## 2015-07-27 DIAGNOSIS — R17 Unspecified jaundice: Secondary | ICD-10-CM

## 2015-07-27 DIAGNOSIS — K831 Obstruction of bile duct: Secondary | ICD-10-CM

## 2015-07-27 DIAGNOSIS — C221 Intrahepatic bile duct carcinoma: Secondary | ICD-10-CM

## 2015-07-27 DIAGNOSIS — C787 Secondary malignant neoplasm of liver and intrahepatic bile duct: Secondary | ICD-10-CM

## 2015-07-27 DIAGNOSIS — Z66 Do not resuscitate: Secondary | ICD-10-CM

## 2015-07-27 DIAGNOSIS — E119 Type 2 diabetes mellitus without complications: Secondary | ICD-10-CM

## 2015-07-27 DIAGNOSIS — C801 Malignant (primary) neoplasm, unspecified: Secondary | ICD-10-CM

## 2015-07-27 DIAGNOSIS — N179 Acute kidney failure, unspecified: Secondary | ICD-10-CM

## 2015-07-27 DIAGNOSIS — E162 Hypoglycemia, unspecified: Secondary | ICD-10-CM

## 2015-07-27 DIAGNOSIS — K713 Toxic liver disease with chronic persistent hepatitis: Secondary | ICD-10-CM

## 2015-07-27 LAB — RENAL FUNCTION PANEL
ALBUMIN: 1.5 g/dL — AB (ref 3.5–5.0)
ANION GAP: 18 — AB (ref 5–15)
BUN: 122 mg/dL — ABNORMAL HIGH (ref 6–20)
CALCIUM: 7.8 mg/dL — AB (ref 8.9–10.3)
CO2: 18 mmol/L — AB (ref 22–32)
CREATININE: 4.67 mg/dL — AB (ref 0.61–1.24)
Chloride: 97 mmol/L — ABNORMAL LOW (ref 101–111)
GFR calc non Af Amer: 13 mL/min — ABNORMAL LOW (ref >60)
GFR, EST AFRICAN AMERICAN: 15 mL/min — AB (ref >60)
GLUCOSE: 96 mg/dL (ref 65–99)
PHOSPHORUS: 10.1 mg/dL — AB (ref 2.5–4.6)
Potassium: 5.6 mmol/L — ABNORMAL HIGH (ref 3.5–5.1)
SODIUM: 133 mmol/L — AB (ref 135–145)

## 2015-07-27 LAB — HEPATIC FUNCTION PANEL
ALBUMIN: 1.6 g/dL — AB (ref 3.5–5.0)
ALT: 102 U/L — ABNORMAL HIGH (ref 17–63)
AST: 165 U/L — AB (ref 15–41)
Alkaline Phosphatase: 139 U/L — ABNORMAL HIGH (ref 38–126)
BILIRUBIN DIRECT: 12 mg/dL — AB (ref 0.1–0.5)
Indirect Bilirubin: 5 mg/dL — ABNORMAL HIGH (ref 0.3–0.9)
Total Bilirubin: 17 mg/dL — ABNORMAL HIGH (ref 0.3–1.2)
Total Protein: 5.6 g/dL — ABNORMAL LOW (ref 6.5–8.1)

## 2015-07-27 LAB — GLUCOSE, CAPILLARY
GLUCOSE-CAPILLARY: 103 mg/dL — AB (ref 65–99)
GLUCOSE-CAPILLARY: 106 mg/dL — AB (ref 65–99)
GLUCOSE-CAPILLARY: 111 mg/dL — AB (ref 65–99)
GLUCOSE-CAPILLARY: 115 mg/dL — AB (ref 65–99)
GLUCOSE-CAPILLARY: 95 mg/dL (ref 65–99)
Glucose-Capillary: 106 mg/dL — ABNORMAL HIGH (ref 65–99)

## 2015-07-27 LAB — CBC
HCT: 19.7 % — ABNORMAL LOW (ref 39.0–52.0)
HEMOGLOBIN: 7.1 g/dL — AB (ref 13.0–17.0)
MCH: 23.7 pg — AB (ref 26.0–34.0)
MCHC: 36 g/dL (ref 30.0–36.0)
MCV: 65.7 fL — ABNORMAL LOW (ref 78.0–100.0)
PLATELETS: 601 10*3/uL — AB (ref 150–400)
RBC: 3 MIL/uL — AB (ref 4.22–5.81)
RDW: 18.3 % — ABNORMAL HIGH (ref 11.5–15.5)
WBC: 18.1 10*3/uL — ABNORMAL HIGH (ref 4.0–10.5)

## 2015-07-27 LAB — MAGNESIUM: MAGNESIUM: 2.5 mg/dL — AB (ref 1.7–2.4)

## 2015-07-27 MED ORDER — LORAZEPAM 2 MG/ML IJ SOLN
1.0000 mg | INTRAMUSCULAR | Status: DC | PRN
  Filled 2015-07-27: qty 1

## 2015-07-27 NOTE — Progress Notes (Signed)
CSW received consult for placement at [facility].  CSW spoke with pt wife concerning [facility] and informed that they do not have beds available currently and were unsure when they would be able to offer a bed.  CSW informed of alternative hospice facility options which pt family thought would be too far away for family to visit.  Pt, pt wife, and pt niece discussed idea of home hospice and pt is stating that he does want to return home with hospice care- family in agreement and feel as if his needs can be managed there  CSW informed RNCM- CSW signing off  Domenica Reamer, [place] Worker [phone]
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
## 2015-07-27 NOTE — Progress Notes (Signed)
Wife at bedside, she said she is okay with the decision of the patient be DNR.

## 2015-07-27 NOTE — Progress Notes (Signed)
OT Cancellation Note  Patient Details Name: Ray Hall MRN: [id number] DOB: Mar 30, 1961   Cancelled Treatment:     Per Dr. Antonieta Pert note, pt with very poor prognosis and expected D/C to [facility]. OT signing off.   Ada, OTR/L  [id number] 07/27/2015 07/27/2015, 9:46 AM

## 2015-07-27 NOTE — Progress Notes (Signed)
Physical Therapy Treatment Patient Details Name: Ray Hall MRN: [id number] DOB: 08-31-1960 Today's Date: 07/27/2015    History of Present Illness Ray Hall is a 55 y.o. male presenting with hypoglycemia. PMH is significant for recently diagnosed cholangio-carcinoma, T2DM, HTN, microcytic anemia, neurogenic bladder with h/o recurrent UTIs and renal failure, and left BKA.     PT Comments    Pt admitted with above diagnosis and has very poor medical prognosis due to metastatic cancer. Pt wanted to try and participate in therapy today and at least get OOB to the chair but pt sat EOB and felt very nauseated and wanted to lay back down. Pt will continue to benefit from PT services as long as he wants to continue to try and partiicpate. PT will continue to follow.    Follow Up Recommendations  Home health PT;Supervision/Assistance - 24 hour     Equipment Recommendations  None recommended by PT    Recommendations for Other Services OT consult     Precautions / Restrictions Precautions Precautions: Fall Restrictions Weight Bearing Restrictions: No Other Position/Activity Restrictions: no    Mobility  Bed Mobility Overal bed mobility: Needs Assistance Bed Mobility: Supine to Sit;Sit to Supine     Supine to sit: Mod assist;HOB elevated Sit to supine: Mod assist   General bed mobility comments: Pt needed Mod A to bring trunk upright and bring his bottom to the EOB. Needed help bringing his legs back in the bed going from sit to supine and needed 2 Mod A to scoot up in bed.   Transfers                 General transfer comment: Pt refused, feeling nauseated.   Ambulation/Gait             General Gait Details: Pt refused, feeling nauseated.    Stairs            Wheelchair Mobility    Modified Rankin (Stroke Patients Only)       Balance Overall balance assessment: Needs assistance Sitting-balance support: Bilateral upper extremity  supported Sitting balance-Leahy Scale: Fair Sitting balance - Comments: Able to sit EOB with supervision with UE support.                             Cognition Arousal/Alertness: Awake/alert Behavior During Therapy: Flat affect Overall Cognitive Status: Within Functional Limits for tasks assessed                      Exercises      General Comments General comments (skin integrity, edema, etc.): Pt wanted to try and get to the chair but pt came to the EOB and started feeling very nauseated and dry heaving and needed to lay back down.       Pertinent Vitals/Pain Pain Assessment: No/denies pain    Home Living                      Prior Function            PT Goals (current goals can now be found in the care plan section) Acute Rehab PT Goals Patient Stated Goal: to go home PT Goal Formulation: With patient Time For Goal Achievement: 08-23-15 Potential to Achieve Goals: Fair Progress towards PT goals: Progressing toward goals    Frequency  Min 3X/week    PT Plan Current plan remains appropriate  Co-evaluation             End of Session   Activity Tolerance: Patient limited by fatigue;Patient limited by lethargy Patient left: in bed;with call bell/phone within reach;with bed alarm set     Time: [id number] PT Time Calculation (min) (ACUTE ONLY): 15 min  Charges:  $Therapeutic Activity: 8-22 mins                    G Codes:      Ray Hall, SPT Ray Hall 07/27/2015, 10:18 AM

## 2015-07-27 NOTE — Care Management Note (Addendum)
Case Management Note  Patient Details  Name: Ray Hall MRN: [id number] Date of Birth: [birth year]  Subjective/Objective:       Pt admitted with renal failure             Action/Plan:    9:  CM informed that pt wishes to return home with hospice.  Attending made aware of pt wishes.  CM provided Home Hospice list to wife/pt, CM will follow up in the am for agency decision.  Pt possibly going to discharge to residential hospice, CSW consulted.  CM will continue to monitor  0900:  CM contacted attending to gain clarity for discharge plan,  CM was informed that pt wanted to talk with his wife, once decision is made attending would follow up with CSW and or CM. CM informed CSW of pending referral.   Expected Discharge Date:   (UNKNOWN)               Expected Discharge Plan:  [place]  In-House Referral:  Clinical Social Work  Discharge planning Services  CM Consult  Post Acute Care Choice:    Choice offered to:     DME Arranged:    DME Agency:     HH Arranged:    [place] Agency:     Status of Service:  In process, will continue to follow  Medicare Important Message Given:    Date Medicare IM Given:    Medicare IM give by:    Date Additional Medicare IM Given:    Additional Medicare Important Message give by:     If discussed at [place] of Stay Meetings, dates discussed:    Additional Comments:  Maryclare Labrador, RN 07/27/2015, 3:04 PM
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
## 2015-07-28 DIAGNOSIS — K767 Hepatorenal syndrome: Secondary | ICD-10-CM

## 2015-07-28 LAB — CBC
HEMATOCRIT: 17.4 % — AB (ref 39.0–52.0)
HEMOGLOBIN: 6.2 g/dL — AB (ref 13.0–17.0)
MCH: 23.4 pg — ABNORMAL LOW (ref 26.0–34.0)
MCHC: 35.6 g/dL (ref 30.0–36.0)
MCV: 65.7 fL — ABNORMAL LOW (ref 78.0–100.0)
Platelets: 554 10*3/uL — ABNORMAL HIGH (ref 150–400)
RBC: 2.65 MIL/uL — AB (ref 4.22–5.81)
RDW: 18.1 % — ABNORMAL HIGH (ref 11.5–15.5)
WBC: 14.9 10*3/uL — AB (ref 4.0–10.5)

## 2015-07-28 LAB — URINE CULTURE

## 2015-07-28 LAB — GLUCOSE, CAPILLARY
GLUCOSE-CAPILLARY: 106 mg/dL — AB (ref 65–99)
GLUCOSE-CAPILLARY: 111 mg/dL — AB (ref 65–99)
GLUCOSE-CAPILLARY: 119 mg/dL — AB (ref 65–99)
GLUCOSE-CAPILLARY: 95 mg/dL (ref 65–99)

## 2015-07-28 MED ORDER — GI COCKTAIL ~~LOC~~: 30.0000 mL | Freq: Three times a day (TID) | ORAL | Status: AC | PRN

## 2015-07-28 MED ORDER — FENTANYL 12 MCG/HR TD PT72
12.5000 ug | MEDICATED_PATCH | TRANSDERMAL | Status: DC
  Administered 2015-07-28: 12.5 ug via TRANSDERMAL
  Filled 2015-07-28: qty 1

## 2015-07-28 MED ORDER — PANTOPRAZOLE SODIUM 40 MG PO TBEC: 40.0000 mg | DELAYED_RELEASE_TABLET | Freq: Two times a day (BID) | ORAL | Status: AC

## 2015-07-28 MED ORDER — FENTANYL 12 MCG/HR TD PT72: 12.5000 ug | MEDICATED_PATCH | TRANSDERMAL | Status: AC
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
## 2015-07-28 NOTE — Progress Notes (Signed)
Discharge Note:   Patient alert and oriented and in no apparent distress.  Generalized weakness noted. VSS  Patient being discharged home under Hospice care.  Wife and other family members at bedside. Patient's wife given discharge instructions regarding medications and upcoming appointments.  She verbalized understanding of all instructions.  Foley catheter, peripheral IV, and telemetry discontinued.  Biliary drain in place with dressing clean, dry, and intact. Patient will be transported home via [place].

## 2015-07-28 NOTE — Discharge Instructions (Signed)
We changed your pain mediation to a Fentanyl Patch  The Protonix and GI cocktail is to help with heart burn

## 2015-07-28 NOTE — Care Management Note (Addendum)
Case Management Note  Patient Details  Name: Ray Hall MRN: [id number] Date of Birth: 08-Aug-1960  Subjective/Objective:       Pt admitted with renal failure             Action/Plan:    07/28/2015  1611:  PTAR preparing pt for transport, verification that equipment has arrived in the home, hospice liaison made aware that pt is about to transport home.  CM contacted by Erline Levine with [facility]; equipment delivery time moved up to 3:30pm,  liason request that pt transport time be moved up to 3:30pm.  PTAR agreed to earlier pick up time.  Family/pt/bedside nurse away.   1400: [facility] has accepted referral.  CM contacted PTAR Liliane Channel) -  transport has been arranged from [facility] to pts home at approximately 5pm.  Pt/wife/bedside nurse/[facility] liaison all made aware of approximate transport time home.  CM faxed discharge summary to [facility] as requested, required documents for discharge have been placed in pts shadow chart.   CM confirmed with attending that the discharge plan is for pt to discharge home with hospice.  Wife/pt chose [facility].  CM contacted agency and provided referral to [facility] at [phone], hospice liaison will contacted CM once referral has been accepted.  Equipment including hospital bed will be delivered to home between 1-5 pm today, wife will be contacted prior to delivery.   07/27/15 1630:  CM informed that pt wishes to return home with hospice.  Attending made aware of pt wishes.  CM provided Home Hospice list to wife/pt, CM will follow up in the am for agency decision.  Pt possibly going to discharge to residential hospice, CSW consulted.  CM will continue to monitor  0900:  CM contacted attending to gain clarity for discharge plan,  CM was informed that pt wanted to talk with his wife, once decision is made attending would follow up with CSW and or CM. CM informed CSW of pending referral.   Expected Discharge Date:   (UNKNOWN)               Expected  Discharge Plan:  Home w Hospice Care  In-House Referral:  Clinical Social Work  Discharge planning Services  CM Consult  Post Acute Care Choice:    Choice offered to:     DME Arranged:    DME Agency:     HH Arranged:    [place] Agency:     Status of Service:  In process, will continue to follow  Medicare Important Message Given:    Date Medicare IM Given:    Medicare IM give by:    Date Additional Medicare IM Given:    Additional Medicare Important Message give by:     If discussed at [place] of Stay Meetings, dates discussed:    Additional Comments:  Maryclare Labrador, RN 07/28/2015, 8:52 AM

## 2015-07-28 NOTE — Progress Notes (Signed)
Notified by Lona Millard of family request for Hospice and [place] services at home after discharge. Chart and patient information currently under review to confirm hospice eligibility.   Spoke with patient and wife, at bedside to initiate education related to hospice philosophy, services and team approach to care. Family verbalized understanding of the information provided. Per discussion, plan is for discharge to home by PTAR today.   Please send signed completed DNR form home with patient.  Patient will need prescriptions for discharge comfort medications.   DME needs discussed and family requested hospital bed, OBT, 3N1, and W/C  for delivery to the home today.  [facility] equipment manager Jewel Ysidro Evert notified and will contact [facility] to arrange delivery to the home.  The home address has been verified and is correct in the chart; wife to be contacted to arrange time of delivery.   [facility] Referral Center aware of the above.  Completed discharge summary will need to be faxed to [facility] at [phone] when final.  Please notify [facility] when patient is ready to leave unit at discharge-call [phone].   [facility] information and contact numbers have been given to wife during visit.  Above information shared with Inocente Salles, [facility]   Please call with any questions.  Thank You,  Freddi Starr RN, [facility] Liaison  [phone]

## 2015-07-28 NOTE — Progress Notes (Signed)
Ray Hall is continuing to decline gradually. His hemoglobin is now down to 6.2. I think this is clearly reflective of his kidneys not working.  I spoke to he and his wife this morning. They want to try to get him home. I think this is very reasonable. I need to make sure that before he gets home that all the necessary equipment will be brought to his house. He will definitely need a hospital bed. He will need a bedside table. He needs either a wheelchair or walker.  Given his rate of decline, I really think that his prognosis should be no more than 1 week. I just hope that he will have the care that he needs. I think hospice will have to come out quite a bit to help.  He is not hurting. He is not bleeding. It is hard to say how much he is eating. He's had no cough. He's had no fever. He's had no nausea or vomiting. There's been no shortness of breath.  On his physical exam, he is afebrile. His temperature is 98.5. Pulse is 87. Blood pressure 104/59. His oxygen saturation is 99% head and neck exam still shows marked scleral icterus. He has no adenopathy in the neck. Lungs are with decreased at the bases. Cardiac exam shows an irregular rate and rhythm with occasional extra beat. There are no murmurs, rubs or bruits. Abdomen is obese but soft. Bowel sounds are decreased. There is no guarding or rebound tenderness. Extremities shows the left BKA.  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 He has fairly progressive renal insufficiency.  I would not check a more labs on him. I still think that this would be beneficial for his quality of life.  Again, he can get home with hospice, that would be fantastic. It would have a nice to have [facility] but they just do not have any beds available. He has a good family. I know they'll be able to help him out.  It is just very sad and very personally disappointing that we just have not been able to  help him out.  We had a very good prayer session. His faith remain strong.  I very much appreciate all the great care that he is received in the stepdown unit.   Pete E.  1 Corinthians 13:13
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
## 2015-07-28 NOTE — Progress Notes (Signed)
CRITICAL VALUE ALERT  Critical value received:  Hgb 6.2  Date of notification:  07/28/2015   Time of notification:  [id number]  Critical value read back:Yes.    Nurse who received alert:  Reap, Jon Gills   MD notified (1st page):  FMTS intern pager  Time of first page:  [phone]  MD notified (2nd page):   Time of second page:  Responding MD:  FMTS   Time MD responded:  [id number]  MD order to ignore hemoglobin level as pt is planning to d/c home with hospice today.

## 2015-08-02 ENCOUNTER — Ambulatory Visit: Payer: Medicaid Other | Admitting: Physical Therapy

## 2015-08-02 ENCOUNTER — Telehealth: Payer: Self-pay | Admitting: *Deleted

## 2015-08-02 NOTE — Telephone Encounter (Signed)
Call from hospice RN stating that patient's pain in uncontrolled on fentanyl patch 12.5 mcg. Patient has restarted an old [facility] prescription in the home for his breakthrough pain. Judeen Hammans RN would like recommendations from Dr Marin Olp.  Spoke to Dr Marin Olp who will increase his fentanyl to 50mcg patches and reinstate the [facility] prescription for breakthrough pain.   Left message on voice mail for [facility] regarding medication changes. Left call back number for any need of clarification.

## 2015-08-07 ENCOUNTER — Ambulatory Visit: Payer: Medicaid Other | Admitting: Hematology & Oncology

## 2015-08-07 ENCOUNTER — Other Ambulatory Visit: Payer: Medicaid Other

## 2015-08-07 ENCOUNTER — Telehealth: Payer: Self-pay | Admitting: *Deleted

## 2015-08-07 NOTE — Telephone Encounter (Signed)
Received a fax from [facility] stating patient is not able to swallow tablets. Please send in a new Rx for Protonix liquid or capsule.  Derl Barrow, RN

## 2015-08-09 ENCOUNTER — Telehealth: Payer: Self-pay | Admitting: *Deleted

## 2015-08-10 NOTE — Telephone Encounter (Signed)
Disregard message. See phone note that follows. Orion Mole,CMA

## 2015-08-28 NOTE — Telephone Encounter (Signed)
Please inquire as to why patient is unable to take a tablet by mouth. Thanks!

## 2015-08-28 NOTE — Telephone Encounter (Signed)
Received notification that patient passed away at home today at 9:40a.  Dr Marin Olp notified.

## 2015-08-28 DEATH — deceased

## 2017-08-04 IMAGING — MR MR ABDOMEN WO/W CM
9 of 17 series · 23 of 48 positions shown · IV contrast (Yes   MH)
Comparison: CT abdomen pelvis dated 07/08/2015

CLINICAL DATA: Painless jaundice, multiple liver masses

EXAM:
MRI ABDOMEN WITHOUT AND WITH CONTRAST
TECHNIQUE: Multiplanar multisequence MR imaging of the abdomen was performed
both before and after the administration of intravenous contrast.
CONTRAST:  20mL MULTIHANCE GADOBENATE DIMEGLUMINE 529 MG/ML IV SOLN

[Series 9: DWI b500 · axial · 6.0mm · 1.88mm/px · z∈[-184,+107]mm · 5 of 135 slices shown]
[im 1/135]
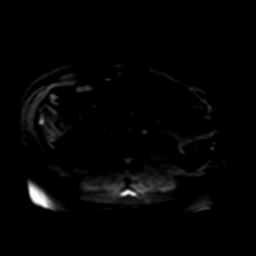
[im 34/135]
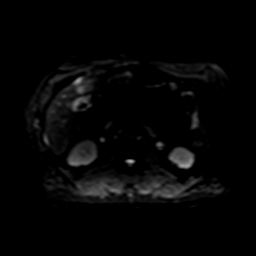
[im 68/135]
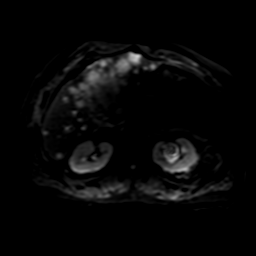
[im 101/135]
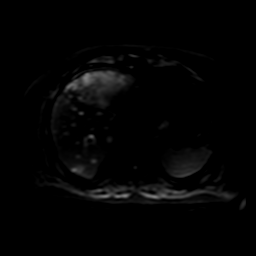
[im 135/135]
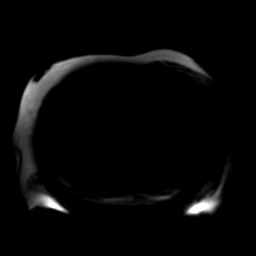

[Series 10: cor ssfse nav · coronal · 6.0mm · 0.94mm/px · 1 of 36 slices shown]
[im 1/36]
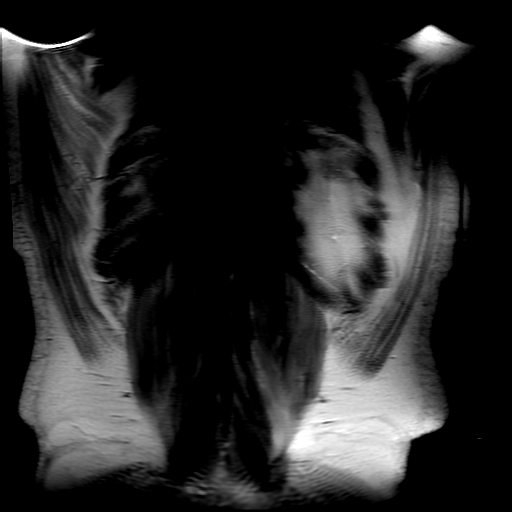

[Series 11: T1 dynamic · axial · 4.0mm · 0.94mm/px · z∈[-192,+84]mm · 2 of 70 slices shown (1 of 4)]
[im 1/70]
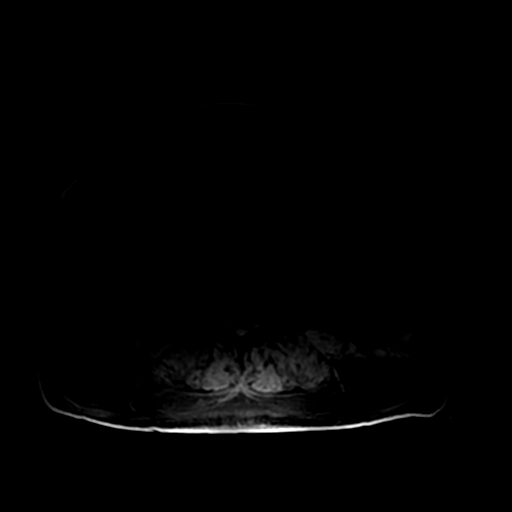
[im 70/70]
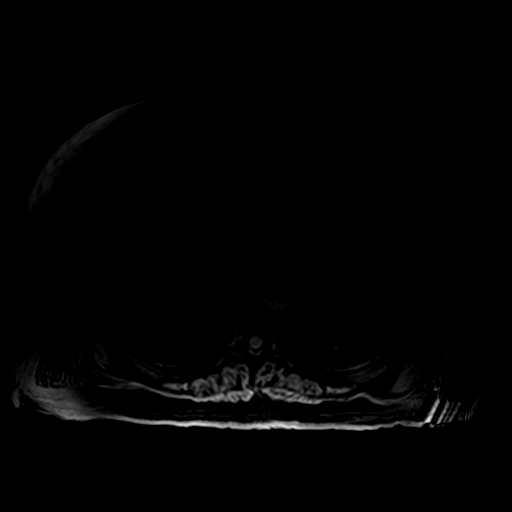

[Series 14: T1 dynamic · coronal · 4.0mm · 0.94mm/px · 3 of 72 slices shown (2 of 4)]
[im 1/72]
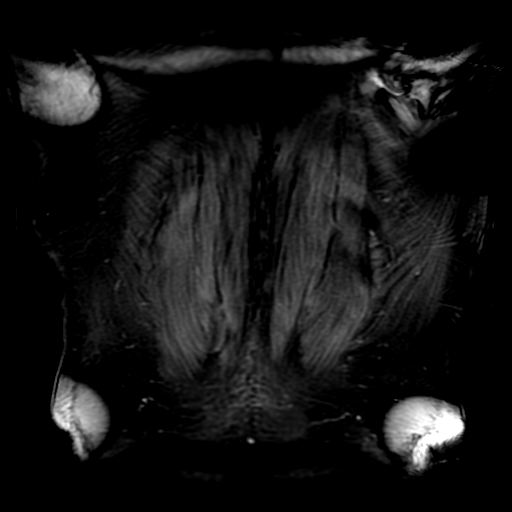
[im 36/72]
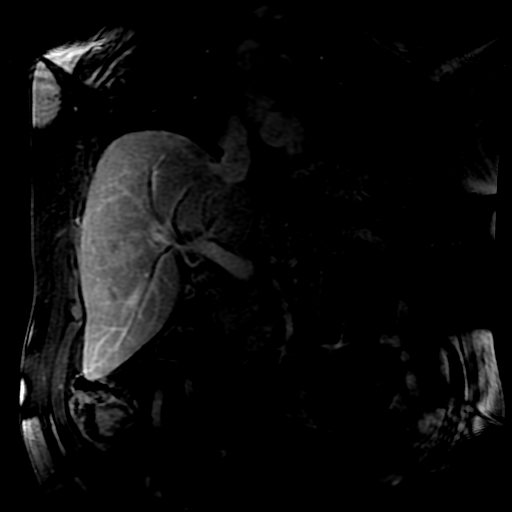
[im 72/72]
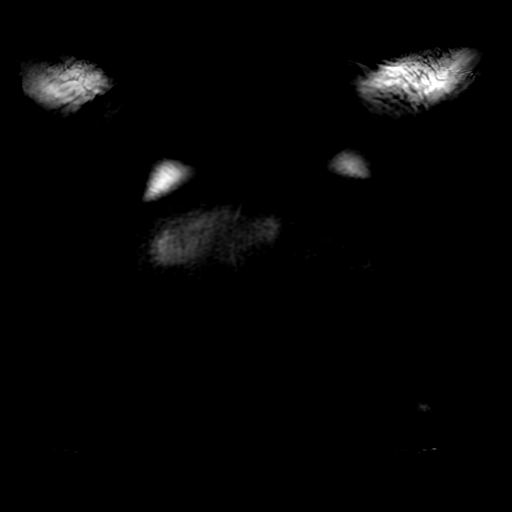

[Series 700: pu:ax ssfse nav · axial · 6.0mm · 0.94mm/px · z∈[-184,+107]mm · 2 of 45 slices shown]
[im 1/45]
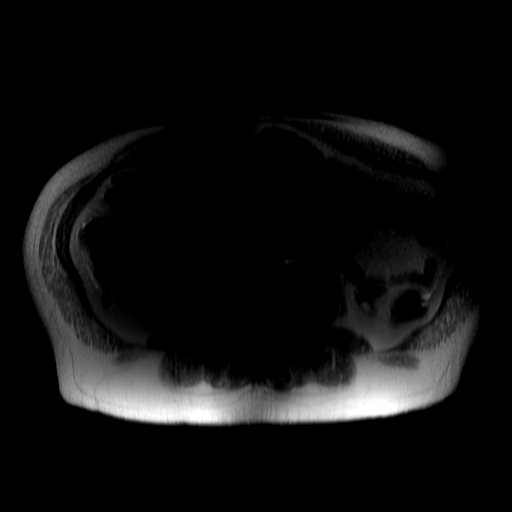
[im 45/45]
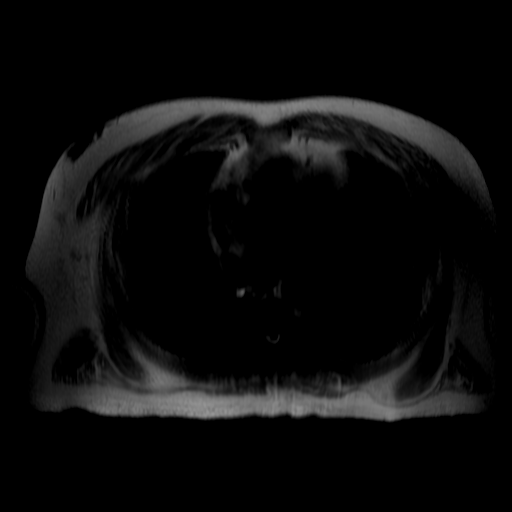

[Series 900: DWI · axial · 6.0mm · 1.88mm/px · z∈[-184,+107]mm · 2 of 45 slices shown]
[im 1/45]
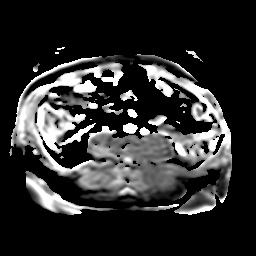
[im 45/45]
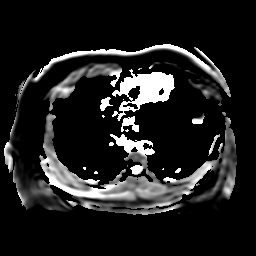

[Series 1101: T1 dynamic · axial · 4.0mm · 0.94mm/px · z∈[-192,+84]mm · 3 of 70 slices shown (3 of 4)]
[im 1/70]
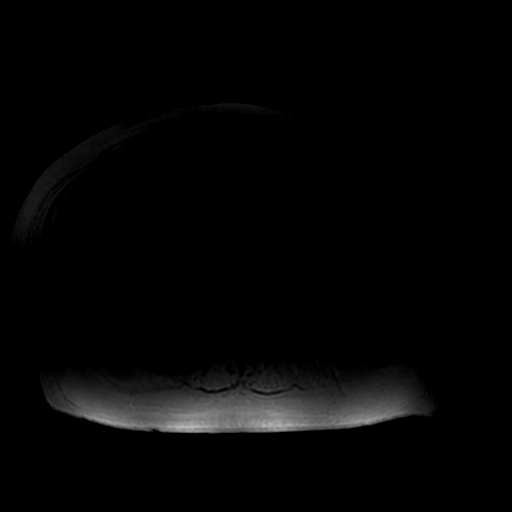
[im 35/70]
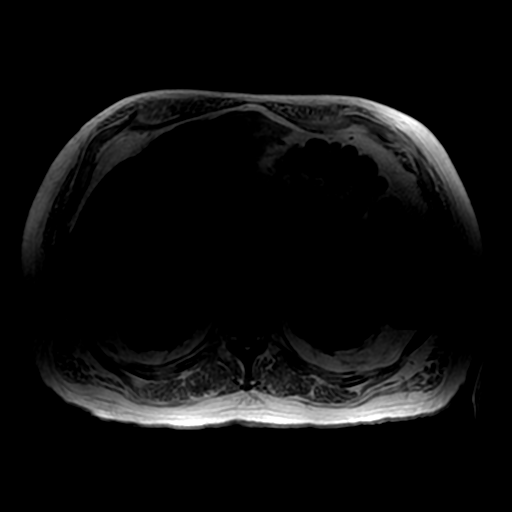
[im 70/70]
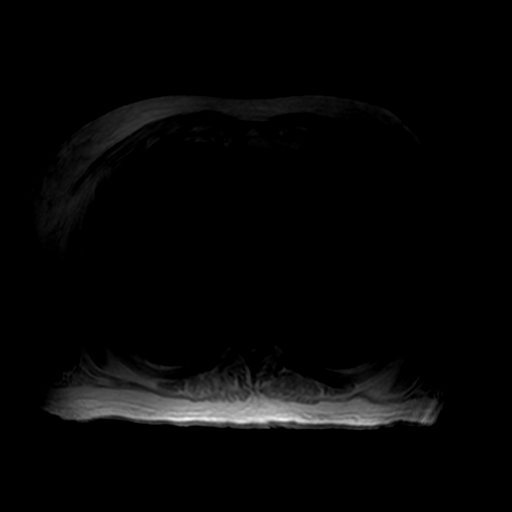

[Series 1102: T1 dynamic · axial · 4.0mm · 0.94mm/px · z∈[-192,+84]mm · 3 of 70 slices shown (4 of 4)]
[im 1/70]
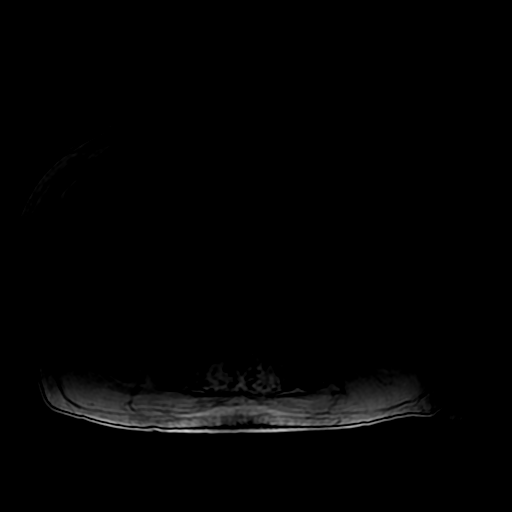
[im 35/70]
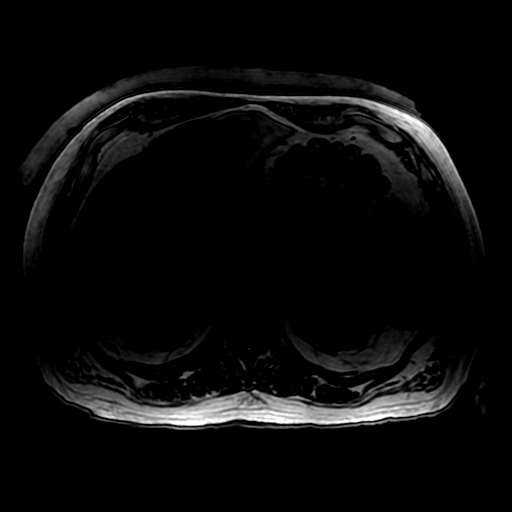
[im 70/70]
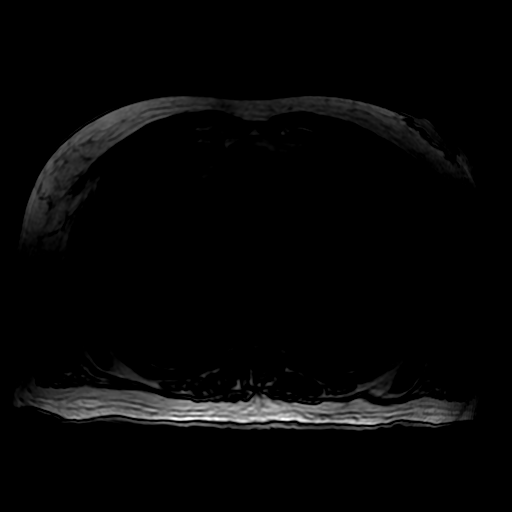

[Series 1300: T1 dynamic post-contrast · axial · non-contrast · 4.0mm · 0.94mm/px · z∈[-192,-56]mm · 2 of 70 slices shown]
[im 1/70]
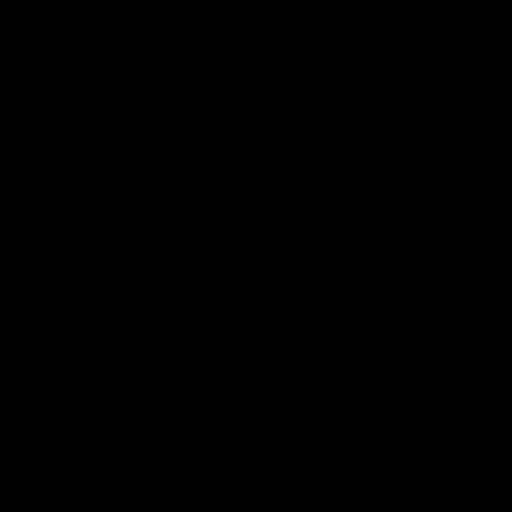
[im 35/70]
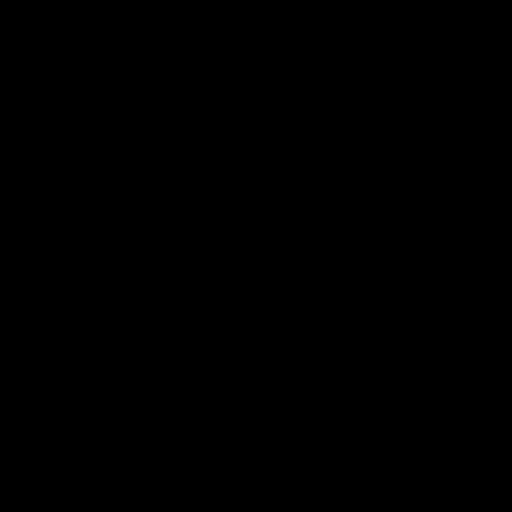

[23 of 48 positions shown; findings below may reference images not displayed]

FINDINGS: Lower chest:  Lung bases are clear.

Hepatobiliary: Dominant 8.8 x 9.3 cm hypoenhancing mass centered in
the medial segment left hepatic lobe (series 0821/ image 22).

Innumerable additional smaller hypoenhancing metastases in both
hepatic lobes. For example:

--1.8 cm lesion in the anterior right hepatic dome (series 0821/
image 7)

--2.6 cm lesion in the medial segment left hepatic lobe (series
0821/ image 21

--2.3 cm lesion in the lateral segment left hepatic lobe (series
0821, image 2/image 31)

--2.9 cm lesion in the lateral segment left hepatic lobe (series
0821, image 2/ image 34)

--1.8 cm lesion inferiorly in the posterior segment right hepatic
lobe (series 0821/ image 52)

Gallbladder is notable for excretory contrast. No intrahepatic or
extrahepatic ductal dilatation.

Pancreas: Within normal limits.

Spleen: Within normal limits.

Adrenals/Urinary Tract: Adrenal glands are within normal limits.

Right kidney is within normal limits. Moderate left
hydroureteronephrosis, unchanged.

Stomach/Bowel: Stomach is within normal limits.

Visualized bowel is unremarkable.

Vascular/Lymphatic: No evidence of abdominal aortic aneurysm. Portal
vein is patent.

Necrotic upper abdominal/retroperitoneal lymphadenopathy, including:

--1.6 cm short axis peripancreatic node (series 9040/image 23)

--1.9 cm short axis node in the porta hepatis (series 9040/ image
29)

--1.7 cm short axis node in the porta hepatis (series 9040/ image
36)

--2.3 cm short axis portacaval node (series 9040/ image 40)

--1.7 cm short axis retrocaval node (series 9040/image 53)

Other: No abdominal ascites.

Musculoskeletal: No focal osseous lesions.
IMPRESSION: Dominant 9.3 cm mass centered in the medial segment left hepatic
lobe.

Innumerable additional smaller hypotensive metastases in both
hepatic lobes.

Necrotic upper abdominal/retroperitoneal lymphadenopathy, as above.

Correlate with biopsy results. By imaging, this appearance favors
multifocal hepatocellular carcinoma over intrahepatic
cholangiocarcinoma.
